# Patient Record
Sex: Male | Born: 1945 | Race: White | Hispanic: No | Smoking: Former smoker
Health system: Southern US, Community
[De-identification: ages and names within clinical notes are randomized; demographics above are authoritative.]

## PROBLEM LIST (undated history)

## (undated) DIAGNOSIS — E039 Hypothyroidism, unspecified: Secondary | ICD-10-CM

## (undated) DIAGNOSIS — Z8739 Personal history of other diseases of the musculoskeletal system and connective tissue: Secondary | ICD-10-CM

## (undated) DIAGNOSIS — Z955 Presence of coronary angioplasty implant and graft: Secondary | ICD-10-CM

## (undated) DIAGNOSIS — I10 Essential (primary) hypertension: Secondary | ICD-10-CM

## (undated) DIAGNOSIS — Z87891 Personal history of nicotine dependence: Secondary | ICD-10-CM

## (undated) DIAGNOSIS — E785 Hyperlipidemia, unspecified: Secondary | ICD-10-CM

## (undated) DIAGNOSIS — I219 Acute myocardial infarction, unspecified: Secondary | ICD-10-CM

## (undated) DIAGNOSIS — I2510 Atherosclerotic heart disease of native coronary artery without angina pectoris: Secondary | ICD-10-CM

## (undated) DIAGNOSIS — I739 Peripheral vascular disease, unspecified: Secondary | ICD-10-CM

## (undated) DIAGNOSIS — I214 Non-ST elevation (NSTEMI) myocardial infarction: Secondary | ICD-10-CM

## (undated) DIAGNOSIS — J439 Emphysema, unspecified: Secondary | ICD-10-CM

## (undated) DIAGNOSIS — I6529 Occlusion and stenosis of unspecified carotid artery: Secondary | ICD-10-CM

## (undated) DIAGNOSIS — M1990 Unspecified osteoarthritis, unspecified site: Secondary | ICD-10-CM

## (undated) DIAGNOSIS — J189 Pneumonia, unspecified organism: Secondary | ICD-10-CM

## (undated) HISTORY — PX: TONSILLECTOMY: SUR1361

## (undated) HISTORY — DX: Hyperlipidemia, unspecified: E78.5

## (undated) HISTORY — DX: Atherosclerotic heart disease of native coronary artery without angina pectoris: I25.10

## (undated) HISTORY — DX: Essential (primary) hypertension: I10

## (undated) HISTORY — DX: Occlusion and stenosis of unspecified carotid artery: I65.29

## (undated) HISTORY — DX: Emphysema, unspecified: J43.9

## (undated) HISTORY — DX: Personal history of nicotine dependence: Z87.891

## (undated) HISTORY — DX: Hypothyroidism, unspecified: E03.9

---

## 2011-05-25 DIAGNOSIS — I2510 Atherosclerotic heart disease of native coronary artery without angina pectoris: Secondary | ICD-10-CM

## 2011-05-25 DIAGNOSIS — I219 Acute myocardial infarction, unspecified: Secondary | ICD-10-CM

## 2011-05-25 HISTORY — DX: Atherosclerotic heart disease of native coronary artery without angina pectoris: I25.10

## 2011-05-25 HISTORY — DX: Acute myocardial infarction, unspecified: I21.9

## 2011-06-05 DIAGNOSIS — I214 Non-ST elevation (NSTEMI) myocardial infarction: Secondary | ICD-10-CM

## 2011-06-05 HISTORY — DX: Non-ST elevation (NSTEMI) myocardial infarction: I21.4

## 2011-06-08 ENCOUNTER — Emergency Department (HOSPITAL_COMMUNITY): Payer: Medicare Other

## 2011-06-08 ENCOUNTER — Inpatient Hospital Stay (HOSPITAL_COMMUNITY)
Admission: EM | Admit: 2011-06-08 | Discharge: 2011-06-17 | DRG: 233 | Disposition: A | Discharge status: Home / Self Care | Payer: Medicare Other | Attending: Cardiothoracic Surgery | Admitting: Cardiothoracic Surgery

## 2011-06-08 DIAGNOSIS — I2109 ST elevation (STEMI) myocardial infarction involving other coronary artery of anterior wall: Principal | ICD-10-CM | POA: Diagnosis present

## 2011-06-08 DIAGNOSIS — E785 Hyperlipidemia, unspecified: Secondary | ICD-10-CM | POA: Diagnosis present

## 2011-06-08 DIAGNOSIS — I5023 Acute on chronic systolic (congestive) heart failure: Secondary | ICD-10-CM | POA: Diagnosis not present

## 2011-06-08 DIAGNOSIS — I2510 Atherosclerotic heart disease of native coronary artery without angina pectoris: Secondary | ICD-10-CM | POA: Insufficient documentation

## 2011-06-08 DIAGNOSIS — I745 Embolism and thrombosis of iliac artery: Secondary | ICD-10-CM | POA: Diagnosis present

## 2011-06-08 DIAGNOSIS — K5900 Constipation, unspecified: Secondary | ICD-10-CM | POA: Diagnosis not present

## 2011-06-08 DIAGNOSIS — I2582 Chronic total occlusion of coronary artery: Secondary | ICD-10-CM | POA: Diagnosis present

## 2011-06-08 DIAGNOSIS — I25118 Atherosclerotic heart disease of native coronary artery with other forms of angina pectoris: Secondary | ICD-10-CM | POA: Insufficient documentation

## 2011-06-08 DIAGNOSIS — J449 Chronic obstructive pulmonary disease, unspecified: Secondary | ICD-10-CM | POA: Diagnosis present

## 2011-06-08 DIAGNOSIS — F17200 Nicotine dependence, unspecified, uncomplicated: Secondary | ICD-10-CM | POA: Diagnosis present

## 2011-06-08 DIAGNOSIS — E039 Hypothyroidism, unspecified: Secondary | ICD-10-CM | POA: Diagnosis present

## 2011-06-08 DIAGNOSIS — I739 Peripheral vascular disease, unspecified: Secondary | ICD-10-CM

## 2011-06-08 DIAGNOSIS — D62 Acute posthemorrhagic anemia: Secondary | ICD-10-CM | POA: Diagnosis not present

## 2011-06-08 DIAGNOSIS — J4489 Other specified chronic obstructive pulmonary disease: Secondary | ICD-10-CM | POA: Diagnosis present

## 2011-06-08 DIAGNOSIS — I509 Heart failure, unspecified: Secondary | ICD-10-CM | POA: Diagnosis present

## 2011-06-08 HISTORY — DX: Peripheral vascular disease, unspecified: I73.9

## 2011-06-08 LAB — POCT I-STAT TROPONIN I: Troponin i, poc: 21.55 ng/mL (ref 0.00–0.08)

## 2011-06-08 LAB — CK TOTAL AND CKMB (NOT AT ARMC)
CK, MB: 33.8 ng/mL (ref 0.3–4.0)
Relative Index: 3.3 — ABNORMAL HIGH (ref 0.0–2.5)

## 2011-06-08 LAB — CBC
MCHC: 34.5 g/dL (ref 30.0–36.0)
RDW: 14 % (ref 11.5–15.5)

## 2011-06-08 LAB — COMPREHENSIVE METABOLIC PANEL
ALT: 55 U/L — ABNORMAL HIGH (ref 0–53)
CO2: 25 mEq/L (ref 19–32)
Calcium: 9.2 mg/dL (ref 8.4–10.5)
Creatinine, Ser: 1.05 mg/dL (ref 0.50–1.35)
GFR calc Af Amer: 83 mL/min — ABNORMAL LOW (ref >90)
GFR calc non Af Amer: 71 mL/min — ABNORMAL LOW (ref >90)
Glucose, Bld: 119 mg/dL — ABNORMAL HIGH (ref 70–99)
Total Bilirubin: 0.3 mg/dL (ref 0.3–1.2)

## 2011-06-09 ENCOUNTER — Inpatient Hospital Stay (HOSPITAL_COMMUNITY): Payer: Medicare Other

## 2011-06-09 DIAGNOSIS — I739 Peripheral vascular disease, unspecified: Secondary | ICD-10-CM

## 2011-06-09 DIAGNOSIS — I2510 Atherosclerotic heart disease of native coronary artery without angina pectoris: Secondary | ICD-10-CM

## 2011-06-09 DIAGNOSIS — Z01810 Encounter for preprocedural cardiovascular examination: Secondary | ICD-10-CM

## 2011-06-09 HISTORY — PX: CARDIAC CATHETERIZATION: SHX172

## 2011-06-09 HISTORY — PX: LEFT HEART CATH AND CORONARY ANGIOGRAPHY: CATH118249

## 2011-06-09 LAB — HEPARIN LEVEL (UNFRACTIONATED): Heparin Unfractionated: 0.17 IU/mL — ABNORMAL LOW (ref 0.30–0.70)

## 2011-06-09 LAB — BASIC METABOLIC PANEL
Calcium: 8.7 mg/dL (ref 8.4–10.5)
Creatinine, Ser: 0.97 mg/dL (ref 0.50–1.35)
GFR calc non Af Amer: 84 mL/min — ABNORMAL LOW (ref >90)
Glucose, Bld: 95 mg/dL (ref 70–99)
Sodium: 139 mEq/L (ref 135–145)

## 2011-06-09 LAB — ABO/RH: ABO/RH(D): A POS

## 2011-06-09 LAB — CARDIAC PANEL(CRET KIN+CKTOT+MB+TROPI)
CK, MB: 21.5 ng/mL (ref 0.3–4.0)
CK, MB: 6.7 ng/mL (ref 0.3–4.0)
Relative Index: 2.1 (ref 0.0–2.5)
Total CK: 665 U/L — ABNORMAL HIGH (ref 7–232)
Total CK: 316 U/L — ABNORMAL HIGH (ref 7–232)
Troponin I: 6.37 ng/mL (ref <0.30)

## 2011-06-09 LAB — LIPID PANEL
LDL Cholesterol: 133 mg/dL — ABNORMAL HIGH (ref 0–99)
Triglycerides: 102 mg/dL (ref <150)
VLDL: 20 mg/dL (ref 0–40)

## 2011-06-09 LAB — HEMOGLOBIN A1C: Mean Plasma Glucose: 114 mg/dL (ref <117)

## 2011-06-10 ENCOUNTER — Inpatient Hospital Stay (HOSPITAL_COMMUNITY): Payer: Medicare Other

## 2011-06-10 DIAGNOSIS — I2510 Atherosclerotic heart disease of native coronary artery without angina pectoris: Secondary | ICD-10-CM

## 2011-06-10 DIAGNOSIS — Z951 Presence of aortocoronary bypass graft: Secondary | ICD-10-CM

## 2011-06-10 DIAGNOSIS — J449 Chronic obstructive pulmonary disease, unspecified: Secondary | ICD-10-CM | POA: Diagnosis present

## 2011-06-10 HISTORY — PX: CORONARY ARTERY BYPASS GRAFT: SHX141

## 2011-06-10 LAB — POCT I-STAT 3, ART BLOOD GAS (G3+)
Acid-base deficit: 3 mmol/L — ABNORMAL HIGH (ref 0.0–2.0)
Acid-base deficit: 2 mmol/L (ref 0.0–2.0)
Acid-base deficit: 2 mmol/L (ref 0.0–2.0)
Bicarbonate: 20.8 mEq/L (ref 20.0–24.0)
O2 Saturation: 95 %
O2 Saturation: 95 %
TCO2: 24 mmol/L (ref 0–100)
TCO2: 24 mmol/L (ref 0–100)
pCO2 arterial: 36 mmHg (ref 35.0–45.0)
pCO2 arterial: 39.8 mmHg (ref 35.0–45.0)
pH, Arterial: 7.405 (ref 7.350–7.450)
pO2, Arterial: 66 mmHg — ABNORMAL LOW (ref 80.0–100.0)
pO2, Arterial: 80 mmHg (ref 80.0–100.0)

## 2011-06-10 LAB — CBC
HCT: 36.1 % — ABNORMAL LOW (ref 39.0–52.0)
HCT: 24.2 % — ABNORMAL LOW (ref 39.0–52.0)
HCT: 28.4 % — ABNORMAL LOW (ref 39.0–52.0)
Hemoglobin: 12.3 g/dL — ABNORMAL LOW (ref 13.0–17.0)
Hemoglobin: 8.3 g/dL — ABNORMAL LOW (ref 13.0–17.0)
Hemoglobin: 9.4 g/dL — ABNORMAL LOW (ref 13.0–17.0)
MCH: 31.9 pg (ref 26.0–34.0)
MCH: 31 pg (ref 26.0–34.0)
MCHC: 34.1 g/dL (ref 30.0–36.0)
MCHC: 34.3 g/dL (ref 30.0–36.0)
MCHC: 33.1 g/dL (ref 30.0–36.0)
MCV: 93.1 fL (ref 78.0–100.0)
MCV: 93.7 fL (ref 78.0–100.0)
Platelets: 137 10*3/uL — ABNORMAL LOW (ref 150–400)
Platelets: 175 10*3/uL (ref 150–400)
RBC: 2.6 MIL/uL — ABNORMAL LOW (ref 4.22–5.81)
RBC: 3.03 MIL/uL — ABNORMAL LOW (ref 4.22–5.81)
RDW: 13.6 % (ref 11.5–15.5)
RDW: 13.5 % (ref 11.5–15.5)
WBC: 8.4 10*3/uL (ref 4.0–10.5)
WBC: 9.1 10*3/uL (ref 4.0–10.5)
WBC: 10.5 10*3/uL (ref 4.0–10.5)

## 2011-06-10 LAB — POCT I-STAT 4, (NA,K, GLUC, HGB,HCT)
Glucose, Bld: 97 mg/dL (ref 70–99)
Glucose, Bld: 141 mg/dL — ABNORMAL HIGH (ref 70–99)
Glucose, Bld: 86 mg/dL (ref 70–99)
HCT: 25 % — ABNORMAL LOW (ref 39.0–52.0)
HCT: 33 % — ABNORMAL LOW (ref 39.0–52.0)
HCT: 28 % — ABNORMAL LOW (ref 39.0–52.0)
HCT: 26 % — ABNORMAL LOW (ref 39.0–52.0)
Hemoglobin: 8.5 g/dL — ABNORMAL LOW (ref 13.0–17.0)
Hemoglobin: 10.2 g/dL — ABNORMAL LOW (ref 13.0–17.0)
Hemoglobin: 9.5 g/dL — ABNORMAL LOW (ref 13.0–17.0)
Hemoglobin: 8.8 g/dL — ABNORMAL LOW (ref 13.0–17.0)
Potassium: 3.3 mEq/L — ABNORMAL LOW (ref 3.5–5.1)
Potassium: 3.9 mEq/L (ref 3.5–5.1)
Potassium: 3.5 mEq/L (ref 3.5–5.1)
Potassium: 3.2 mEq/L — ABNORMAL LOW (ref 3.5–5.1)
Sodium: 143 mEq/L (ref 135–145)
Sodium: 143 mEq/L (ref 135–145)
Sodium: 143 mEq/L (ref 135–145)

## 2011-06-10 LAB — BLOOD GAS, ARTERIAL
Bicarbonate: 24.8 mEq/L — ABNORMAL HIGH (ref 20.0–24.0)
FIO2: 0.21 %
Patient temperature: 98.1
TCO2: 26 mmol/L (ref 0–100)
pCO2 arterial: 39.3 mmHg (ref 35.0–45.0)
pH, Arterial: 7.415 (ref 7.350–7.450)

## 2011-06-10 LAB — CREATININE, SERUM
Creatinine, Ser: 0.98 mg/dL (ref 0.50–1.35)
GFR calc Af Amer: >90 mL/min (ref >90)
GFR calc non Af Amer: 83 mL/min — ABNORMAL LOW (ref >90)

## 2011-06-10 LAB — POCT I-STAT, CHEM 8
Calcium, Ion: 1.18 mmol/L (ref 1.12–1.32)
Chloride: 110 mEq/L (ref 96–112)
HCT: 28 % — ABNORMAL LOW (ref 39.0–52.0)
Potassium: 4.3 mEq/L (ref 3.5–5.1)
Sodium: 141 mEq/L (ref 135–145)

## 2011-06-10 LAB — SURGICAL PCR SCREEN
MRSA, PCR: NEGATIVE
Staphylococcus aureus: NEGATIVE

## 2011-06-10 LAB — PROTIME-INR
INR: 1.27 (ref 0.00–1.49)
Prothrombin Time: 16.2 seconds — ABNORMAL HIGH (ref 11.6–15.2)

## 2011-06-10 LAB — URINALYSIS, MICROSCOPIC ONLY
Bilirubin Urine: NEGATIVE
Hgb urine dipstick: NEGATIVE
Ketones, ur: NEGATIVE mg/dL
Nitrite: NEGATIVE
Protein, ur: NEGATIVE mg/dL
Specific Gravity, Urine: 1.014 (ref 1.005–1.030)
Urobilinogen, UA: 0.2 mg/dL (ref 0.0–1.0)

## 2011-06-10 LAB — PULMONARY FUNCTION TEST

## 2011-06-10 LAB — BASIC METABOLIC PANEL
BUN: 14 mg/dL (ref 6–23)
Calcium: 8.9 mg/dL (ref 8.4–10.5)
GFR calc Af Amer: 75 mL/min — ABNORMAL LOW (ref >90)
GFR calc non Af Amer: 65 mL/min — ABNORMAL LOW (ref >90)
Glucose, Bld: 103 mg/dL — ABNORMAL HIGH (ref 70–99)
Potassium: 4.1 mEq/L (ref 3.5–5.1)

## 2011-06-10 LAB — MAGNESIUM: Magnesium: 2.9 mg/dL — ABNORMAL HIGH (ref 1.5–2.5)

## 2011-06-10 LAB — APTT: aPTT: 45 seconds — ABNORMAL HIGH (ref 24–37)

## 2011-06-10 LAB — HEPARIN LEVEL (UNFRACTIONATED): Heparin Unfractionated: 0.16 IU/mL — ABNORMAL LOW (ref 0.30–0.70)

## 2011-06-10 LAB — GLUCOSE, CAPILLARY: Glucose-Capillary: 115 mg/dL — ABNORMAL HIGH (ref 70–99)

## 2011-06-10 NOTE — Consult Note (Signed)
NAMEMarland [name]  RAMZI, BRATHWAITE NO.:  [id number]  MEDICAL RECORD NO.:  [id number]  LOCATION:  2926                         FACILITY:  [facility]  PHYSICIAN:  Sheliah Plane, MD    DATE OF BIRTH:  [birth year]  DATE OF CONSULTATION: DATE OF DISCHARGE:                                CONSULTATION   REQUESTING PHYSICIAN:  Landry Corporal, MD  CARDIOLOGIST:  Nicki Guadalajara, MD  No known primary physician.  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  HISTORY OF PRESENT ILLNESS:  The patient is a 65 year old male who has known long term smoking history since age 78, also history of hypothyroidism and hyperlipidemia, developed substernal chest pain which woke him during the night and then became diaphoretic and developed arm and chest pain and shortness of breath.  This lasted most of the Saturday night and then the patient went back to bed and on Sunday, he experienced similar chest pain.  This then resolved.  On Monday, he had additional chest pain and came to the emergency room.  EKG showed ST elevation in the anterior leads.  The atrial lead.  It was felt that the patient probably had a myocardial infarction on Saturday.  He was then stable and was admitted, underwent cardiac catheterization on June 09, 2011.  PAST MEDICAL HISTORY:  The patient has known hypertension, hypothyroidism, and hyperlipidemia.  PREVIOUS SURGERY:  Includes tonsillectomy.  Although the patient has known peripheral vascular disease, he denies any claudication.  See [facility] for home meds-reviewed  SOCIAL HISTORY:  The patient is divorced, lives alone, has smoked half a pack to 1 pack of cigarettes a day since age 57.  Does note that he is going to stop.  FAMILY HISTORY:  The patient's father died at age 69.  The patient's mother had cardiac surgery for valve problems.  She died at age 3.  He has 3 sisters, 1 died of blood  clotting disorder.  He has 1 brother.  REVIEW OF SYSTEMS:  The patient denies any fever, chills or night sweats or other constitutional symptoms other than the nausea and chest discomfort as noted.  He does note that he has shortness of breath.  His overall physical activity is fairly sedentary.  He denies claudication. He denies amaurosis or TIAs.  He denies any change in bowel habits.  No blood in stool or urine.  Denies any urinary difficulty.  Denies psychiatric history.  Other review of systems are negative.  PHYSICAL EXAMINATION:  VITAL SIGNS:  The patient's blood pressure is 140/80, heart rate 70 sinus, afebrile.  He is on 2 L with O2 sat of 96%. GENERAL:  The patient is in no distress, awake and alert, and able to relate his history. HEENT:  He has no scleral icterus. NECK:  Without adenopathy.  There is no jugular venous distention.  He has no bruits. CARDIAC:  Regular rate and rhythm without murmur, gallop. ABDOMEN:  Benign without palpable masses or tenderness. EXTREMITIES:  He has equal radial pulses bilaterally.  Both right and left groins are with pressure dressing.  There is no significant hematoma from the cardiac catheterization.  He  has +2 DP and PT pulses on the left, +1 on the right.  LABORATORY FINDINGS:  Potassium of 4.1, creatinine of 1.4.  Hematocrit 36, hemoglobin of 12.5, and platelet count 233,000.  The patient's CK-MB was 21.5, total 665.  Troponin was 22.18 on June 09, 2011 midnight. Hemoglobin A1c is 5.6.  TSH is elevated to 5.787.  Cardiac catheterization films were reviewed.  There was difficulty in passing the catheter from the right groin with right iliac totally occluded.  The left iliac has a proximal 70% stenosis.  There is some calcification of the left main coronary artery with possible 40% narrowing, though the left main is short __________ does have proximal 90% LAD lesion.  The circ is with some luminal irregularities, but none more than  30%.  The right is totally occluded with collateral filling from the left.  Ejection fraction was depressed at 40% with anterior hypokinesis.  IMPRESSION:  The patient with symptomatic severe 2-vessel coronary artery disease with known iliac disease.  With the patient's totally occluded right and diseased proximal left anterior descending coronary artery and symptomatic coronary artery, coronary artery bypasses has been recommend to the patient, especially in light of his depressed left ventricular function.  The risks and options were discussed with the patient and his family in detail.  The risk of death, infection, stroke, myocardial infarction, bleeding, blood transfusion all discussed.  In addition, we specifically discussed the patient's increased risk for surgery because of his long-term smoking and depressed left ventricular function, though the patient's risk at medical treatment would be much higher and attempts at angioplasty are not favorable with the left main calcification and total occlusion of the right coronary artery. Overall, he is told that the expected risk has been discussed with the patient with a 90% chance of reasonable outcome.  The patient is willing to proceed and we will plan to proceed tomorrow, on June 10, 2011.     Sheliah Plane, MD     EG/MEDQ  D:  06/10/2011  T:  06/10/2011  Job:  [id number]  Electronically Signed by Sheliah Plane MD on 06/10/2011 06:21:59 PM

## 2011-06-11 ENCOUNTER — Inpatient Hospital Stay (HOSPITAL_COMMUNITY): Payer: Medicare Other

## 2011-06-11 LAB — GLUCOSE, CAPILLARY
Glucose-Capillary: 108 mg/dL — ABNORMAL HIGH (ref 70–99)
Glucose-Capillary: 121 mg/dL — ABNORMAL HIGH (ref 70–99)
Glucose-Capillary: 116 mg/dL — ABNORMAL HIGH (ref 70–99)
Glucose-Capillary: 124 mg/dL — ABNORMAL HIGH (ref 70–99)
Glucose-Capillary: 109 mg/dL — ABNORMAL HIGH (ref 70–99)
Glucose-Capillary: 130 mg/dL — ABNORMAL HIGH (ref 70–99)

## 2011-06-11 LAB — POCT I-STAT, CHEM 8
Chloride: 105 mEq/L (ref 96–112)
Glucose, Bld: 112 mg/dL — ABNORMAL HIGH (ref 70–99)
HCT: 30 % — ABNORMAL LOW (ref 39.0–52.0)
Hemoglobin: 10.2 g/dL — ABNORMAL LOW (ref 13.0–17.0)
Potassium: 4.1 mEq/L (ref 3.5–5.1)
Sodium: 139 mEq/L (ref 135–145)

## 2011-06-11 LAB — CBC
HCT: 25.4 % — ABNORMAL LOW (ref 39.0–52.0)
HCT: 29.2 % — ABNORMAL LOW (ref 39.0–52.0)
Hemoglobin: 8.6 g/dL — ABNORMAL LOW (ref 13.0–17.0)
Hemoglobin: 9.7 g/dL — ABNORMAL LOW (ref 13.0–17.0)
MCH: 31.6 pg (ref 26.0–34.0)
MCH: 31.3 pg (ref 26.0–34.0)
MCHC: 33.9 g/dL (ref 30.0–36.0)
MCHC: 33.2 g/dL (ref 30.0–36.0)
MCV: 93.4 fL (ref 78.0–100.0)
MCV: 94.2 fL (ref 78.0–100.0)
Platelets: 171 10*3/uL (ref 150–400)
Platelets: 191 10*3/uL (ref 150–400)
RBC: 2.72 MIL/uL — ABNORMAL LOW (ref 4.22–5.81)
RBC: 3.1 MIL/uL — ABNORMAL LOW (ref 4.22–5.81)
RDW: 13.7 % (ref 11.5–15.5)
RDW: 13.9 % (ref 11.5–15.5)
WBC: 9.9 10*3/uL (ref 4.0–10.5)
WBC: 13.7 10*3/uL — ABNORMAL HIGH (ref 4.0–10.5)

## 2011-06-11 LAB — CREATININE, SERUM
Creatinine, Ser: 1.11 mg/dL (ref 0.50–1.35)
GFR calc Af Amer: 77 mL/min — ABNORMAL LOW (ref >90)
GFR calc non Af Amer: 67 mL/min — ABNORMAL LOW (ref >90)

## 2011-06-11 LAB — BASIC METABOLIC PANEL
BUN: 10 mg/dL (ref 6–23)
Chloride: 110 mEq/L (ref 96–112)
GFR calc Af Amer: 85 mL/min — ABNORMAL LOW (ref >90)
GFR calc non Af Amer: 73 mL/min — ABNORMAL LOW (ref >90)
Glucose, Bld: 125 mg/dL — ABNORMAL HIGH (ref 70–99)
Potassium: 4.1 mEq/L (ref 3.5–5.1)
Sodium: 140 mEq/L (ref 135–145)

## 2011-06-11 LAB — MAGNESIUM: Magnesium: 2.4 mg/dL (ref 1.5–2.5)

## 2011-06-12 ENCOUNTER — Inpatient Hospital Stay (HOSPITAL_COMMUNITY): Payer: Medicare Other

## 2011-06-12 LAB — GLUCOSE, CAPILLARY
Glucose-Capillary: 109 mg/dL — ABNORMAL HIGH (ref 70–99)
Glucose-Capillary: 133 mg/dL — ABNORMAL HIGH (ref 70–99)
Glucose-Capillary: 119 mg/dL — ABNORMAL HIGH (ref 70–99)

## 2011-06-12 LAB — BASIC METABOLIC PANEL
BUN: 11 mg/dL (ref 6–23)
CO2: 26 mEq/L (ref 19–32)
Calcium: 8.6 mg/dL (ref 8.4–10.5)
Chloride: 102 mEq/L (ref 96–112)
Creatinine, Ser: 1.05 mg/dL (ref 0.50–1.35)
GFR calc Af Amer: 83 mL/min — ABNORMAL LOW (ref >90)
GFR calc non Af Amer: 71 mL/min — ABNORMAL LOW (ref >90)
Glucose, Bld: 111 mg/dL — ABNORMAL HIGH (ref 70–99)
Potassium: 3.8 mEq/L (ref 3.5–5.1)
Sodium: 135 mEq/L (ref 135–145)

## 2011-06-12 LAB — CBC
HCT: 27.5 % — ABNORMAL LOW (ref 39.0–52.0)
Hemoglobin: 9.3 g/dL — ABNORMAL LOW (ref 13.0–17.0)
MCH: 31.8 pg (ref 26.0–34.0)
MCHC: 33.8 g/dL (ref 30.0–36.0)
MCV: 94.2 fL (ref 78.0–100.0)
Platelets: 175 10*3/uL (ref 150–400)
RBC: 2.92 MIL/uL — ABNORMAL LOW (ref 4.22–5.81)
RDW: 13.9 % (ref 11.5–15.5)
WBC: 10.3 10*3/uL (ref 4.0–10.5)

## 2011-06-12 NOTE — Cardiovascular Report (Signed)
NAMEMarland [name]  Michael, [name] NO.:  [id number]  MEDICAL RECORD NO.:  [id number]  LOCATION:  2926                         FACILITY:  [facility]  PHYSICIAN:  Landry Corporal, MD DATE OF BIRTH:  [birth year]  DATE OF PROCEDURE:  06/09/2011 DATE OF DISCHARGE:                           CARDIAC CATHETERIZATION   ADMITTING CARDIOLOGIST:  Nicki Guadalajara, MD.  PERFORMING PHYSICIAN:  Landry Corporal, MD  PROCEDURES PERFORMED: 1. Left heart catheterization eventually to the left femoral artery     access, common femoral artery access. 2. Left ventriculography in the RAO projection with 11 mL of contrast     per second for a total of 33 mL. 3. Native coronary angiography. 4. Intracoronary nitroglycerin injection 200 mcg x1. 5. Abdominal aortic angiogram with bilateral iliac runoff.  INDICATION: 1. Likely anterior MI, subacute with posterior infarct angina. 2. The patient reports leg cramping and there was difficulty passing     the catheter beyond the right into the right common iliac.  BRIEF HISTORY:  Michael [name] is a 65 year old gentleman with a history of long-term tobacco use, hypothyroidism, hyperlipidemia, and hypertension who developed severe anginal chest pain on Saturday, lasted overnight. It was associated with diaphoresis and radiated to both shoulders across the chest and down into his left arm.  It was there again off and on throughout the entire night and then was relieved by Sunday.  He also noted nausea associated with this.  He then had no symptoms on Sunday; however, had recurrence of anginal pain again on Saturday and then was brought in for concern for ST-elevation MI, although the consideration was it was likely non ST elevations as there are Q-waves and the patient had a positive troponins of 25 and therefore he was admitted for stabilization with plan for cardiac catheterization today.  The risks, benefits, alternatives, and indications of the  procedure were explained to the patient in detail and informed consent was obtained with signed form and placed on chart.  Possible risks include but not limited to death, MI, CVA, VF/VT arrest, need for pacemaker, bleeding, hematoma, vascular injury, coronary injury, renal insufficiency, contrast nephropathy, and adverse medication reactions.  The patient voiced understanding of this and agreed to proceed.  PROCEDURE IN DETAIL:  The patient was brought to the [facility] in the fasting state.  He was prepped and draped in usual sterile fashion for initially right femoral artery access.  After time-out was performed, the patient was sedated with intravenous Versed and fentanyl.  The right groin was anesthetized using 1% subcutaneous lidocaine and the right femoral head was localized using tactile fluoroscopic guidance.  With fluoroscopic guidance, the right common femoral artery was accessed and initially a J-wire was not able to pass adequately and therefore, I switched over to a Versacore wire, which passed into the common iliac distribution.  A 5-French sheath was then placed.  Then, the JR-4 catheter was advanced over the Versacore wire to the common iliac; however, would not pass beyond this.  An iliac angiogram was performed demonstrating severe stenosis and therefore the Versacore wire was exchanged for a Glidewire, however, this was not able to pass  through the presumed lumen in evidence of possible retrograde dissection plane and therefore distal aortogram was performed.  Decision made was to remove the catheter from the right groin, leave the sheath in place, and switch over to the left groin.  Left groin was then prepped and draped in usual sterile fashion.  The right common femoral head was localized using tactile fluoroscopic guidance and the right groin was anesthetized using 1% subcutaneous lidocaine.  Left common femoral artery was  then accessed using modified Seldinger technique with placement of 5-French sheath.  With the [facility] wire, I was able to advance into the descending aorta and therefore started with the 5- [place] JL-4 followed by 5-French JR-4 catheters, were advanced over initially the Versacore wire but then over long exchange wire. Initially, the left coronary artery system were obtained and then the right.  Angiographic image of both the left and right coronary artery system were obtained.  The JL-4 catheter was then re-exchanged for the JL-4 catheter, which was advanced up and used to engage the left coronary artery system for injection of intracoronary nitroglycerin to re-evaluate the LAD.  The catheter was then exchanged over a wire. Angled pigtail catheter was used to cross the aortic valve. Hemodynamics were measured and left ventriculogram was then performed. The catheter was then pulled back across the aortic valve measuring pullback gradient.  The catheter was then pulled back into abdominal aorta for two angiographic images of the proximal and then distal abdominal aorta with bi-iliac runoff.  Also please note with the catheter in place in the right common femoral artery, I was able to measure pressure gradient from central aortic pressure to right femoral artery pressure.  After completion of the case, the catheters were removed completely out of the body over the wire and an ACT was checked as the patient was on heparin prior to the procedure.  The patient was then transferred to the holding area for bilateral sheath removal.  He was in stable condition and no complications.  The estimated blood loss was less than 10 mL.  CATH STATISTICS: 1. Sedation:  3 mg Versed and 50 mcg of fentanyl. 2. Contrast: 50 mL. 3. Intracoronary nitroglycerin 200 mcg.  HEMODYNAMICS: 1. Left ventricular pressure 109/7 mmHg with EDP of 13 mmHg. 2. Central aortic pressure 105/66 mmHg with a mean of 82  mmHg. 3. Right femoral artery pressure of 69/53 mmHg making a peak gradient     of over roughly 40 mmHg. 4. Left ventriculography revealed an EF of 40-45% with a mid to distal     anterior and anterior apical and lateral hypokinesis.  There was no     pullback gradient.  ANGIOGRAPHIC FINDINGS: 1. The right coronary artery appears to be a small vessel and was 100%     occluded in the proximal segment.  It does fill however via LAD     collaterals and does show evidence of having a posterior descending     artery as well as a posterolateral branch that fills and looks to     be relatively large caliber.  The ostial portion of the right     coronary artery has severe calcification. 2. The left main is calcified in the ostial region.  Looking it from     the calcium, the caliber of the vessel does appear to be     significant external calcification; however, the luminal stenosis     is maybe 40-50% with 50% being the highest.  There  is a short left     main, which bifurcates into the LAD and circumflex artery. 3. The LAD is a small to moderate caliber vessel maybe 1-2.5 mm in the     proximal portion and has in the midportion, the 90% focal stenotic     lesion followed by another in the more distal part of the mid 60%     lesion.  The remainder of the LAD is diffusely calcified and     diseased tapering down to the apex, wraps around the apex, and     fills from left-to-right collaterals the PDA.  There is TIMI-3 flow     however. 4. The circumflex comes off shortly from the left main, it slips     around to the atrioventricular groove.  There is one branching     obtuse marginal branch and then some posterolateral branches.     There is maybe about 30% lesion at the bifurcation of the     atrioventricular groove and the obtuse marginal branches.  ABDOMINAL AORTOGRAM: 1. Bilateral renal arteries, celiac and SMA are widely patent. 2. The distal abdominal aorta begins to be a normal  caliber and then     it becomes mildly stenosed and then in the distal portion is     ectatic and mildly aneurysmal in the fusiform pattern.  The distal     aorta gives rise to a large lumbar collateral, which fills     antegrade into the right internal iliac and gives retrograde flow     back up into the right external iliac, which fills down into the     femoral arteries. 3. The right common iliac artery is 100% occluded at the bifurcation.     It reconstitutes as I mentioned via collateral filling from the     right lumbar artery.  After the reconstitution, there is really no     significant disease in the rest of the external iliac and common     femoral artery. 4. The left common iliac has a eccentric, exophytic, calcified roughly     70-80% lesion at the point of the midline artery takeoff.  The     internal iliac is a more distal vessel and has no significant     disease.  IMPRESSION: 1. Severe two-vessel disease with a 99% mid left anterior descending     coronary artery lesion with the left anterior descending coronary     artery giving left-to-right collaterals to 100% proximally occluded     right coronary artery, which is likely chronic. 2. There is a codominant large circumflex and minimal disease. 3. There is at least moderately-calcified left main, 40% to maybe 50%     at the most stenosis. 4. 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 5. Patent bilateral renal artery stents, celiac artery, and SMA     patent. 6. Mildly decreased ejection fraction with a patent left anterior     descending coronary artery wall motion abnormality.  PLAN: 1. As this is a least codominant RCA that has filled from the LAD that     is  diffusely diseased, and not a very good PCI candidate.  I felt     like the best option for now would be to consult CVT Surgery for     possible at least two-vessel CABG with the LAD and the RCA;     however, with the potential for left main disease, there could be     consideration to bypass one of the obtuse marginal branches as     well. 2. The patient will also likely require intervention on his bilateral     iliac disease.  I am not sure how great candidate this is for     percutaneous intervention and we will discuss and likely we will at     least get Vascular Surgery involved as well for post CABG. 3. We will continue on nitroglycerin and heparin drip.  Heparin drip     started 6 hours post sheath removal.  Would like to continue to     titrate his blood pressure medications, especially beta-blocker for     blood pressure control.  I did discuss the case with the patient and the patient's family as well as consultant CVTS, who will see the patient tonight.          ______________________________ Landry Corporal, MD     DWH/MEDQ  D:  06/09/2011  T:  06/10/2011  Job:  [id number]  cc:   Nicki Guadalajara, M.D.  Electronically Signed by Bryan Lemma MD on 06/12/2011 03:25:14 PM

## 2011-06-13 ENCOUNTER — Inpatient Hospital Stay (HOSPITAL_COMMUNITY): Payer: Medicare Other

## 2011-06-13 LAB — BASIC METABOLIC PANEL
BUN: 15 mg/dL (ref 6–23)
CO2: 27 mEq/L (ref 19–32)
Calcium: 8.8 mg/dL (ref 8.4–10.5)
Chloride: 102 mEq/L (ref 96–112)
Creatinine, Ser: 1.04 mg/dL (ref 0.50–1.35)
GFR calc Af Amer: 84 mL/min — ABNORMAL LOW (ref >90)
GFR calc non Af Amer: 72 mL/min — ABNORMAL LOW (ref >90)
Glucose, Bld: 101 mg/dL — ABNORMAL HIGH (ref 70–99)
Potassium: 3.7 mEq/L (ref 3.5–5.1)
Sodium: 138 mEq/L (ref 135–145)

## 2011-06-13 LAB — CBC
HCT: 28.4 % — ABNORMAL LOW (ref 39.0–52.0)
Hemoglobin: 9.6 g/dL — ABNORMAL LOW (ref 13.0–17.0)
MCH: 31.6 pg (ref 26.0–34.0)
MCHC: 33.8 g/dL (ref 30.0–36.0)
MCV: 93.4 fL (ref 78.0–100.0)
Platelets: 204 10*3/uL (ref 150–400)
RBC: 3.04 MIL/uL — ABNORMAL LOW (ref 4.22–5.81)
RDW: 13.9 % (ref 11.5–15.5)
WBC: 9.1 10*3/uL (ref 4.0–10.5)

## 2011-06-13 LAB — CROSSMATCH
ABO/RH(D): A POS
Antibody Screen: NEGATIVE
Unit division: 0
Unit division: 0

## 2011-06-13 LAB — GLUCOSE, CAPILLARY
Glucose-Capillary: 119 mg/dL — ABNORMAL HIGH (ref 70–99)
Glucose-Capillary: 92 mg/dL (ref 70–99)
Glucose-Capillary: 106 mg/dL — ABNORMAL HIGH (ref 70–99)

## 2011-06-14 ENCOUNTER — Inpatient Hospital Stay (HOSPITAL_COMMUNITY): Payer: Medicare Other

## 2011-06-14 LAB — CBC
HCT: 27.8 % — ABNORMAL LOW (ref 39.0–52.0)
MCH: 31.2 pg (ref 26.0–34.0)
MCV: 93.3 fL (ref 78.0–100.0)
Platelets: 231 10*3/uL (ref 150–400)
RBC: 2.98 MIL/uL — ABNORMAL LOW (ref 4.22–5.81)
WBC: 9 10*3/uL (ref 4.0–10.5)

## 2011-06-14 LAB — BASIC METABOLIC PANEL
BUN: 17 mg/dL (ref 6–23)
CO2: 30 mEq/L (ref 19–32)
Calcium: 8.6 mg/dL (ref 8.4–10.5)
Chloride: 99 mEq/L (ref 96–112)
Creatinine, Ser: 1.13 mg/dL (ref 0.50–1.35)

## 2011-06-14 LAB — GLUCOSE, CAPILLARY: Glucose-Capillary: 109 mg/dL — ABNORMAL HIGH (ref 70–99)

## 2011-06-15 ENCOUNTER — Inpatient Hospital Stay (HOSPITAL_COMMUNITY): Payer: Medicare Other

## 2011-06-15 LAB — BASIC METABOLIC PANEL
BUN: 19 mg/dL (ref 6–23)
CO2: 29 mEq/L (ref 19–32)
Chloride: 101 mEq/L (ref 96–112)
Creatinine, Ser: 1.15 mg/dL (ref 0.50–1.35)
GFR calc Af Amer: 74 mL/min — ABNORMAL LOW (ref >90)
Glucose, Bld: 105 mg/dL — ABNORMAL HIGH (ref 70–99)
Potassium: 4.1 mEq/L (ref 3.5–5.1)

## 2011-06-15 LAB — CBC
HCT: 28.6 % — ABNORMAL LOW (ref 39.0–52.0)
Hemoglobin: 9.7 g/dL — ABNORMAL LOW (ref 13.0–17.0)
MCV: 93.2 fL (ref 78.0–100.0)
RBC: 3.07 MIL/uL — ABNORMAL LOW (ref 4.22–5.81)
WBC: 8.3 10*3/uL (ref 4.0–10.5)

## 2011-06-16 ENCOUNTER — Inpatient Hospital Stay (HOSPITAL_COMMUNITY): Payer: Medicare Other

## 2011-06-16 DIAGNOSIS — F17200 Nicotine dependence, unspecified, uncomplicated: Secondary | ICD-10-CM

## 2011-06-16 LAB — BASIC METABOLIC PANEL
BUN: 20 mg/dL (ref 6–23)
CO2: 31 mEq/L (ref 19–32)
Calcium: 9.3 mg/dL (ref 8.4–10.5)
Chloride: 97 mEq/L (ref 96–112)
Creatinine, Ser: 1.13 mg/dL (ref 0.50–1.35)
GFR calc Af Amer: 76 mL/min — ABNORMAL LOW (ref >90)
GFR calc non Af Amer: 65 mL/min — ABNORMAL LOW (ref >90)
Glucose, Bld: 102 mg/dL — ABNORMAL HIGH (ref 70–99)
Potassium: 4.4 mEq/L (ref 3.5–5.1)
Sodium: 138 mEq/L (ref 135–145)

## 2011-06-16 LAB — CBC
HCT: 31 % — ABNORMAL LOW (ref 39.0–52.0)
Hemoglobin: 10.9 g/dL — ABNORMAL LOW (ref 13.0–17.0)
MCH: 32.8 pg (ref 26.0–34.0)
MCHC: 35.2 g/dL (ref 30.0–36.0)
MCV: 93.4 fL (ref 78.0–100.0)
Platelets: 413 10*3/uL — ABNORMAL HIGH (ref 150–400)
RBC: 3.32 MIL/uL — ABNORMAL LOW (ref 4.22–5.81)
RDW: 13.9 % (ref 11.5–15.5)
WBC: 11.3 10*3/uL — ABNORMAL HIGH (ref 4.0–10.5)

## 2011-06-16 NOTE — Op Note (Signed)
Michael [name], HASHEM NO.:  [id number]  MEDICAL RECORD NO.:  [id number]  LOCATION:  2316                         FACILITY:  [facility]  PHYSICIAN:  Sheliah Plane, MD    DATE OF BIRTH:  [birth year]  DATE OF PROCEDURE:  06/10/2011 DATE OF DISCHARGE:                              OPERATIVE REPORT   PREOPERATIVE DIAGNOSIS:  Coronary occlusive disease with recent inferior myocardial infarction out-of-hospital.  POSTOPERATIVE DIAGNOSIS:  Coronary occlusive disease with recent inferior myocardial infarction out-of-hospital.  SURGICAL PROCEDURE:  Coronary artery bypass grafting x2 off pump with the left internal mammary to the left anterior descending coronary artery and reverse saphenous vein graft to the distal right coronary artery with right thigh endovein harvesting.  SURGEON:  Sheliah Plane, MD  FIRST ASSISTANT:  Rowe Clack, P.A.-C  BRIEF HISTORY:  The patient is a 65 year old male with no known previous history of coronary artery disease who presents with a several-day episodes of prolonged chest pain, ultimately was admitted the 3rd day into his symptoms with positive troponins, and evidence of ST-elevation myocardial infarction.  He was admitted by Cardiology, felt since then he had out-of-hospital myocardial infarction several days prior, was stabilized medically, and then underwent cardiac catheterization by Dr. Herbie Baltimore a the day prior to surgery.  Catheterization revealed question of 30% left main, 90% proximal LAD lesion, and total occlusion of his right coronary artery with faint collateral filling from the circumflex. Circumflex had some luminal irregularities and a 20% to 30% stenosis of the first obtuse marginal.  Ejection fraction was depressed at approximately 40% with anterior apical and inferior hypokinesis.  The risks and options of bypass surgery were discussed with the patient in detail, including his increased risk of surgery because of  continued long-term smoking up until the day of the procedure ,and confirmed severe COPD by pulmonary function tests, and depressed LV function; however, with his proximal right disease, medical therapy with his symptoms would have a much worse outcome.  The risks including death, infection, stroke, myocardial infarction, bleeding, blood transfusion with surgery were all discussed with the patient.  He agreed and signed informed consent.  DESCRIPTION OF PROCEDURE:  With Swan-Ganz and arterial line monitors in place, the patient underwent general endotracheal anesthesia without incidence.  Skin of chest and legs were prepped with Betadine and draped in usual sterile manner.  Using the [employer] endovein harvesting system, a segment of vein was harvested from the right thigh and was of good quality and caliber.  Median sternotomy was performed.  Left internal mammary artery was dissected down as a pedicle graft.  The distal artery was divided, had excellent free flow.  Pericardium was opened.  The patient did have overall approximately 40% ejection fraction.  The right coronary artery was larger than anticipated from the films and transmural infarct in the inferior wall along the distribution of the posterior descending was obvious.  The patient's both LAD and the right coronary artery appeared to be suitable targets and were easily identifiable, so we proceeded with off pump coronary artery bypass grafting.  The patient was systemically heparinized.  Attention was turned first to the LAD.  The left internal mammary,  which had been dissected down as pedicle graft had excellent free flow.  The [employer] stabilization device was used, first the suction cup on the apex of the heart, white plastic tapes were placed proximally and distally around the LAD at the site of the anastomosis.  The vessel was opened.  During this time, the patient remained hemodynamically stable.  1-mm probe passed  distally, 1-5 probe passed proximally.  Using running 8-0 Prolene, left internal mammary artery was anastomosed to the left anterior descending coronary artery.  The bulldog on the mammary artery was released as the aortotomy was completed.  Fascia was tacked to the epicardium.  There was a good Doppler signal in the vessel when completed.  Attention was then turned to the right graft and a partial occlusion clamp was placed on the ascending aorta.  A single punch aortotomy was performed.  Then using a running 6-0 Prolene, the segment of reverse saphenous vein graft was anastomosed to the ascending aorta first.  The graft was then trimmed to appropriate length.  The stabilization device was placed along the acute margin of the heart, which gave good exposure of the distal right coronary artery.  This was dissected out.  Plastic tapes were placed around the distal right coronary artery.  This distal right coronary artery appeared much larger than the appearance on the films.  The vessel was opened, easily admitted a 1.5 mm probe distally.  Using a running 7-0 Prolene, the distal vein graft was anastomosed to the distal right coronary artery. The vessel was flushed as the atriotomy was completed and blood flow was restored down the right coronary artery.  During this period, the patient remained hemodynamically stable.  Dose of protamine was administered.  Sites of anastomosis were inspected and free of bleeding. Atrial and ventricular pacing wires had been applied.  A left pleural tube and a Blake mediastinal drain were left in place.  Pericardium was reapproximated.  Sternum was closed with #6 stainless steel wire. Fascia was closed with interrupted 0-Vicryl, running 3-0 Vicryl subcutaneous tissue, 4-0 subcuticular stitch in skin edges.  Dry dressings were applied.  Sponge and needle count was reported as correct at the completion of procedure.  The patient tolerated the procedure without  obvious complications.  Was transferred to the surgical intensive care unit for further postoperative care.  He did not require any blood bank blood products during the procedure.     Sheliah Plane, MD     EG/MEDQ  D:  06/12/2011  T:  06/12/2011  Job:  [id number]  cc:   Landry Corporal, MD  Electronically Signed by Sheliah Plane MD on 06/16/2011 12:41:31 PM

## 2011-06-16 NOTE — Consult Note (Signed)
NAMEELAND, Michael NO.:  [id number]  MEDICAL RECORD NO.:  [id number]  LOCATION:  2926                         FACILITY:  [facility]  PHYSICIAN:  Janetta Hora. Mahrosh Donnell, MD  DATE OF BIRTH:  [birth year]  DATE OF CONSULTATION:  06/09/2011 DATE OF DISCHARGE:                                CONSULTATION   REQUESTING PHYSICIAN:  Landry Corporal, MD with [facility].  REASON FOR CONSULTATION:  Right common iliac artery occlusion.  HISTORY OF PRESENT ILLNESS:  The patient is a 65 year old male that I was asked by Dr. Herbie Baltimore for evaluation of right common iliac artery occlusion.  The patient was recently admitted to the hospital with episodes of chest pain, shortness of breath, and had a cardiac catheterization earlier today.  He has multiple severe coronary artery disease and is being considered for coronary artery bypass grafting. Incidentally, found on his cardiac catheterization was a right common iliac occlusion with a large right lumbar collateral, which fills his right lower extremity distally.  The patient currently does not experience any claudication symptoms.  He is primarily limited by his shortness of breath, which occurs at approximately 30 yards.  He denies any rest pain.  He denies any nonhealing ulcers.  He may have had some claudication symptoms in the past, but this is been very remote.  He has not been very active recently because of his shortness of breath. Chronic medical problems include hyperlipidemia and hypothyroidism.  He also has a history of coronary artery disease.  He was smoking prior to admission and has smoked essentially all of his life.  PAST MEDICAL HISTORY:  As above.  SOCIAL HISTORY:  As above.  FAMILY HISTORY:  No history of early cardiovascular disease.  ALLERGIES:  No known drug allergies.  Full review of systems was performed and is positive for cardiac status and vascular as above.  Otherwise, it is  negative.  ABIs were reviewed.  ABI on the right was 0.8, on the left was 0.7.  He also had a carotid duplex exam today, which showed no significant stenosis bilaterally.  PHYSICAL EXAMINATION:  GENERAL:  White male in no acute distress. VITAL SIGNS:  Blood pressure 118/76, heart rate 71, oxygen saturation 98% on room air, temperature is 98.7. HEENT:  Negative. LUNGS:  Clear to auscultation. CARDIAC:  Regular rate and rhythm without murmur. ABDOMEN:  Soft, nontender, nondistended.  No mass. EXTREMITIES:  He has bandages on the left and right groins.  The left femoral pulse is easily palpable through this, and right femoral pulse is not easily palpable.  Popliteal and pedal pulses are absent bilaterally.  Feet are pink, warm, and well-perfused. NEURO:  Upper extremity and lower extremity motor strength is 5/5 and symmetric. SKIN:  No open ulcer or rash.  Creatinine 0.97.  Recent troponin 6.37.  ASSESSMENT:  Right common iliac artery occlusion is currently asymptomatic in the phase of severe coronary artery disease requiring coronary artery bypass grafting.  We will schedule the patient for an outpatient followup visit in 6 months' time, which will allow him time to recover from his coronary bypass procedure and see if he has any symptoms after  his exercise tolerance improves.  We will obtain ABIs at his repeat office visit in 6 months.     Janetta Hora. Peg Fifer, MD     CEF/MEDQ  D:  06/09/2011  T:  06/10/2011  Job:  [id number]  Electronically Signed by Fabienne Bruns MD on 06/16/2011 12:34:53 PM

## 2011-06-22 DIAGNOSIS — I214 Non-ST elevation (NSTEMI) myocardial infarction: Secondary | ICD-10-CM | POA: Insufficient documentation

## 2011-06-23 NOTE — Discharge Summary (Signed)
Michael, [name] NO.:  [id number]  MEDICAL RECORD NO.:  [id number]  LOCATION:  2017                         FACILITY:  [facility]  PHYSICIAN:  Sheliah Plane, MD    DATE OF BIRTH:  09/06/1945  DATE OF ADMISSION:  06/08/2011 DATE OF DISCHARGE:  06/17/2011                              DISCHARGE SUMMARY   PRIMARY ADMITTING DIAGNOSIS:  Chest pain.  ADDITIONAL/DISCHARGE DIAGNOSES: 1. Severe coronary artery disease. 2. Presumed out-of-hospital ST-segment elevation myocardial     infarction. 3. Hyperlipidemia. 4. Hypertension. 5. Hypothyroidism. 6. History of tobacco abuse. 7. Postoperative hypotension. 8. Postoperative acute blood loss anemia. 9. Right common iliac artery.  PROCEDURES PERFORMED: 1. Cardiac catheterization. 2. Coronary artery bypass grafting x2 off pump (left internal mammary     artery to the LAD, saphenous vein graft to the distal right     coronary artery). 3. Endoscopic vein harvest, right thigh.  HISTORY:  The patient is a 65 year old male with no known history of coronary artery disease.  On the Saturday prior to this admission, the patient developed severe chest discomfort at rest with associated diaphoresis and radiation into the shoulder and the left arm.  The pain ultimately resolved, but recurred again the following day.  There was some associated nausea, but no vomiting.  When the pain reoccurred again on Monday, the patient ultimately presented to the emergency department for further evaluation.  He was noted to have some ST elevation in anterior leads.  He was seen by Cardiology and it was felt that the patient had probably sustained a myocardial infarction when his pain initially began on Saturday.  He was subsequently admitted and started on medical therapy.  HOSPITAL COURSE:  The patient was stabilized and taken to the cardiac cath lab on June 09, 2011, where he underwent catheterization by Dr. Herbie Baltimore.  This showed  100% occlusion in the proximal right coronary artery, which filled via collaterals.  There was a 50% left main stenosis as well as a 90% LAD lesion.  Of note, during the catheterization, it was noted that the right common iliac artery was 100% occluded at the bifurcation as well as a 70%-80% calcified left common iliac artery.  It was felt that the patient was a poor candidate for percutaneous intervention and a Cardiac Surgery consult was obtained.  Dr. Sheliah Plane saw the patient and reviewed his films and felt that he would benefit from coronary artery bypass grafting.  He explained all risks, benefits, and alternatives of surgery to the patient and the patient agreed to proceed.  Prior to surgery, he did undergo preoperative workup including ABIs which were 0.8 on the right and 0.7 on the left, and carotid duplex studies which showed no stenosis bilaterally.  He also was seen in consultation by Dr. Fabienne Bruns for Vascular Surgery and it was felt that since his right common iliac artery occlusion was currently asymptomatic, that he should proceed with his coronary artery bypass surgery and would be seen in their office as an outpatient to follow up on his iliac artery stenoses.  All risks, benefits, and alternatives of surgery were explained to the patient and he agreed to proceed.  He was taken to the operating room on June 10, 2011, where he underwent coronary artery bypass grafting off pump x2 by Dr. Tyrone Sage.  Please see previously dictated operative report for complete details of surgery.  He tolerated the procedure well and was transferred to the SICU in stable condition.  He was extubated shortly after surgery.  He remained stable, although somewhat hypotensive initially postoperatively.  He did require pressor agents for the first 48 hours postoperatively, but these were weaned and discontinued.  He was started on a beta-blocker and a statin as well as aspirin for  his recent MI.  Once his blood pressure had stabilized, he was started on ACE inhibitor by Cardiology.  However, his blood pressure did not tolerate this and he developed hypotension which was symptomatic with blood pressures running in the 80s-90s systolic range.  The ACE inhibitor was discontinued and his blood pressure did begin to trend upward.  He otherwise remained stable and was able to be transferred to the step-down unit.  Overall, he has done well postoperatively.  He is ambulating in the halls without problem and has been weaned from supplemental oxygen.  His incisions are all healing well.  He has been diuresed back down to his preoperative weight and his Lasix has been discontinued.  He has been afebrile and his vital signs have been stable.  Despite the discontinuation of his ACE inhibitor, his blood pressures have continued to remain low normal with systolics between 90 and 110.  For this reason, he has not been restarted on an ACE inhibitor or an ARB.  Dr. Tyrone Sage and Dr. Tresa Endo discussed the possibility of starting him on an ARB as an outpatient.  Presently, his most recent labs show a sodium 138, potassium 4.4, BUN 20, creatinine 1.13. Hemoglobin 10.9, hematocrit 31, platelets 413, white count 11.3.  Chest x-ray showed a tiny left pleural effusion, otherwise stable.  He has been seen and evaluated on June 17, 2011, and is deemed ready for discharge home at this time.  DISCHARGE MEDICATIONS: 1. Enteric-coated aspirin 325 mg daily. 2. Lopressor 12.5 mg b.i.d. 3. Oxycodone IR 1 tablet q.4 h. p.r.n. pain. 4. Crestor 20 mg at bedtime. 5. Levothyroxine 100 mcg at bedtime. 6. Niacin 500 mg at bedtime. 7. Fish oil 1200 mg 2 capsules at bedtime.  DISCHARGE INSTRUCTIONS:  He is asked to refrain from driving, heavy lifting, or strenuous activity.  He may continue ambulating daily and using his incentive spirometer.  He may shower daily and clean his incisions with soap  and water.  He may continue a low-fat, low-sodium diet.  DISCHARGE FOLLOWUP:  He will need to make an appointment to see Dr. Tresa Endo in 2 weeks.  He will then follow up in 3 weeks with Dr. Dennie Maizes PA day with a chest x-ray from [facility] prior to this appointment.  In the interim, if he experiences any problems or has questions, he is asked to contact our office immediately.     Coral Ceo, P.A.   ______________________________ Sheliah Plane, MD    GC/MEDQ  D:  06/17/2011  T:  06/17/2011  Job:  [id number]  cc:   Nicki Guadalajara, M.D. Janetta Hora. Fields, MD [facility]  Electronically Signed by Weldon Inches. on 06/23/2011 10:11:05 AM Electronically Signed by Sheliah Plane MD on 06/23/2011 05:39:27 PM

## 2011-07-06 ENCOUNTER — Encounter: Payer: Self-pay | Admitting: Cardiothoracic Surgery

## 2011-07-07 ENCOUNTER — Other Ambulatory Visit: Payer: Self-pay | Admitting: Cardiothoracic Surgery

## 2011-07-07 DIAGNOSIS — I2510 Atherosclerotic heart disease of native coronary artery without angina pectoris: Secondary | ICD-10-CM

## 2011-07-08 ENCOUNTER — Encounter: Payer: Self-pay | Admitting: Cardiothoracic Surgery

## 2011-07-08 DIAGNOSIS — I10 Essential (primary) hypertension: Secondary | ICD-10-CM | POA: Insufficient documentation

## 2011-07-08 DIAGNOSIS — E039 Hypothyroidism, unspecified: Secondary | ICD-10-CM | POA: Insufficient documentation

## 2011-07-10 ENCOUNTER — Encounter: Payer: Self-pay | Admitting: *Deleted

## 2011-07-13 ENCOUNTER — Ambulatory Visit
Admission: RE | Admit: 2011-07-13 | Discharge: 2011-07-13 | Disposition: A | Discharge status: Home / Self Care | Payer: Medicare Other | Source: Ambulatory Visit | Attending: Cardiothoracic Surgery | Admitting: Cardiothoracic Surgery

## 2011-07-13 ENCOUNTER — Encounter: Payer: Self-pay | Admitting: Physician Assistant

## 2011-07-13 ENCOUNTER — Ambulatory Visit (INDEPENDENT_AMBULATORY_CARE_PROVIDER_SITE_OTHER): Payer: Self-pay | Admitting: Physician Assistant

## 2011-07-13 VITALS — BP 109/47 | HR 84 | Resp 16

## 2011-07-13 DIAGNOSIS — I2510 Atherosclerotic heart disease of native coronary artery without angina pectoris: Secondary | ICD-10-CM

## 2011-07-13 DIAGNOSIS — Z951 Presence of aortocoronary bypass graft: Secondary | ICD-10-CM

## 2011-07-13 NOTE — Progress Notes (Signed)
HPI:  Patient returns for routine postoperative follow-up having undergone off-pump CABG x 2 on 06/10/2011 by Dr. Tyrone Sage.  The patient's early postoperative recovery while in the hospital was notable for hypotension related to ACE-inhibitor therapy. This improved after the ACE-I was d/c'ed.   Since hospital discharge the patient reports no problems. He has seen Dr. Herbie Baltimore, and because his BP was still borderline, they have held off on resuming an ACE-I.  They did increase his Niacin.  He feels his stamina is slowly returning.  He is ambulating daily, but is limited by his peripheral vascular disease.  He states that Dr. Herbie Baltimore plans some type of intervention once he recovers from CABG.  He has quit smoking.  He denies chest pain or SOB.  Current Outpatient Prescriptions   Medication  Sig  Dispense  Refill   .  aspirin 325 MG EC tablet  Take 325 mg by mouth daily.     .  fish oil-omega-3 fatty acids 1000 MG capsule  Take 2 g by mouth daily.     Marland Kitchen  levothyroxine (SYNTHROID, LEVOTHROID) 100 MCG tablet  Take 100 mcg by mouth daily.     .  metoprolol tartrate (LOPRESSOR) 25 MG tablet  Take 12.5 mg by mouth 2 (two) times daily.     .  niacin (NIASPAN) 500 MG CR tablet  Take 500 mg by mouth at bedtime.     Marland Kitchen  oxyCODONE (ROXICODONE) 15 MG immediate release tablet  Take 15 mg by mouth every 4 (four) hours as needed.     .  rosuvastatin (CRESTOR) 20 MG tablet  Take 20 mg by mouth daily.      Physical Exam:  BP 109/47, HR 8, Resp 16 Wounds healing well.  Sternum is stable to palpation Heart- RRR, no murmurs Lungs-clear Extremities- no edema  Diagnostic Tests:  Chest x-ray- tiny residual bilateral effusion  Impression:  S/P CABG, doing well   Plan:  May begin driving, and increase activity as tolerated.  I have recommended outpatient cardiac rehab.  He will followup as directed with Dr. Herbie Baltimore.  We will see him back prn.

## 2011-07-13 NOTE — Patient Instructions (Signed)
May begin driving Increase activity as tolerated

## 2011-07-13 NOTE — Progress Notes (Deleted)
  HPI: Patient returns for routine postoperative follow-up having undergone *** on ***. The patient's early postoperative recovery while in the hospital was notable for ***. Since hospital discharge the patient reports ***.   Current Outpatient Prescriptions  Medication Sig Dispense Refill  . aspirin 325 MG EC tablet Take 325 mg by mouth daily.        . fish oil-omega-3 fatty acids 1000 MG capsule Take 2 g by mouth daily.        Marland Kitchen levothyroxine (SYNTHROID, LEVOTHROID) 100 MCG tablet Take 100 mcg by mouth daily.        . metoprolol tartrate (LOPRESSOR) 25 MG tablet Take 12.5 mg by mouth 2 (two) times daily.        . niacin (NIASPAN) 500 MG CR tablet Take 500 mg by mouth at bedtime.        Marland Kitchen oxyCODONE (ROXICODONE) 15 MG immediate release tablet Take 15 mg by mouth every 4 (four) hours as needed.        . rosuvastatin (CRESTOR) 20 MG tablet Take 20 mg by mouth daily.          Physical Exam: ***  Diagnostic Tests: ***  Impression: ***  Plan: ***

## 2011-08-19 HISTORY — PX: TRANSTHORACIC ECHOCARDIOGRAM: SHX275

## 2011-08-22 NOTE — H&P (Signed)
Michael [name] NO.:  [id number]  MEDICAL RECORD NO.:  [id number]  LOCATION:  [facility]                         FACILITY:  [facility]  PHYSICIAN:  Dr. Tresa Endo              DATE OF BIRTH:  [birth year]  DATE OF ADMISSION:  06/08/2011 DATE OF DISCHARGE:                             HISTORY & PHYSICAL   CHIEF COMPLAINT:  Chest pain.  HISTORY OF PRESENT ILLNESS:  Michael [name] is a 65 year old Caucasian male with a history of tobacco use, hypothyroidism, hyperlipidemia.  He developed a severe chest pain on Saturday and the patient stated he was going to the bathroom sitting on the toilet, became extremely diaphoretic and developed chest tightness from the right shoulder across his chest and then radiating down his left arm.  Pain lasted about 8 minutes and then, it went away and on Sunday the pain returned again. He also reports nausea, but never vomited.  He again experienced some chest pain today.  A EKG showed what appeared to be ST elevation in anterior leads.  Code STEMI was initiated.  The patient was evaluated in the ER by Dr. Tresa Endo.  He felt that the patient probably had infarct on Saturday.  There was no need for current intervention.  The patient denies any and shortness of breath, abdominal pain, and currently he is pain free in the chest.  MEDICATIONS:  Levothyroxine.  ALLERGIES:  No known drug allergies.  PAST MEDICAL HISTORY:  Hypothyroidism, hyperlipidemia, tonsillectomy as a teenager.  SOCIAL HISTORY:  The patient is divorced x2.  He does have a daughter. Lives in [place].  Smokes 1/2 packs of cigarettes per day for quite a few years.  FAMILY HISTORY:  Father deceased approximately age 60.  Mother had valve problems.  She deceased at age 51.  He has 3 sisters, 1 deceased from blood clot, he has 1 brother.  REVIEW OF SYSTEMS:  As per HPI.  PHYSICAL EXAMINATION:  VITAL SIGNS:  Blood pressure is 145/85 with heart rate of 104, temperature 98.6,  respiratory rate 16, and 96% oxygen saturation on 2 L/minute. GENERAL:  Michael [name] is a 65 year old Caucasian male, appears in no apparent distress.  HEENT:  Pupils are equal, round, reactive to light and accommodation.  Extraocular movements intact.  Negative scleral icterus. NECK:  Nontender.  Negative lymphadenopathy. CARDIOVASCULAR:  Regular rate and rhythm.  Negative murmurs, rubs, or gallops.  PULMONARY:  Clear to auscultation bilaterally.  Negative wheezes or rhonchi heard.  ABDOMEN:  Nontender, positive bowel sounds in all quadrants.  Peripheral vascular, 2+ radial pulses, and negative carotid or femoral bruits, decreased pedal pulses. EXTREMITIES:  Warm to touch.  Negative lower extremity edema. NEUROLOGIC: Strength is 5/5 equal bilateral upper and lower extremities. The patient is alert and oriented.  LAB:  Labs pending.   Chest x-ray pending.  EKG shows sinus tachycardia.  Q-waves in V1 and 2. The heart rate of 104 beats per minute.  IMPRESSION: 1. Unstable angina, possible status post infarction over the weekend. 2. Hypothyroidism 3. Hyperlipidemia.  PLAN:  The patient will be admitted to step-down unit and we will start IV nitroglycerin and  IV heparin per pharmacy.  We will also start Lopressor 25 mg q.8, Crestor 20 mg daily, aspirin 325 mg, check cardiac enzymes x3 q.8 hours.  We will also get a BMET.  We will also check fasting lipids, hemoglobin A1c, TSH, and repeat the EKG each morning next 2 days.  I will also get a 2D echocardiograms.  The patient started heart-healthy diet.  He will be n.p.o. after midnight for left heart catheterization on June 09, 2011.  We will initiate Cardiology p.r.n. orders.  He will be started on normal saline at 1 mL 2 mL/kg per hour at 4:00 a.m. tomorrow morning for hydration prior to the procedure.  Med and IV nitroglycerin and titrated for chest pain, should it return.    ______________________________ Michael Finlay,  PA   ______________________________ Dr. Gar Gibbon  D:  06/08/2011  T:  06/08/2011  Job:  [id number]  Electronically Signed by Michael Finlay PA on 06/12/2011 02:20:41 PM Electronically Signed by Nicki Guadalajara M.D. on 08/22/2011 02:17:01 PM

## 2011-08-24 ENCOUNTER — Encounter: Payer: Self-pay | Admitting: *Deleted

## 2011-10-21 ENCOUNTER — Encounter (HOSPITAL_COMMUNITY): Payer: Self-pay | Admitting: Respiratory Therapy

## 2011-10-22 ENCOUNTER — Encounter (HOSPITAL_COMMUNITY): Payer: Self-pay | Admitting: Cardiology

## 2011-10-22 ENCOUNTER — Other Ambulatory Visit: Payer: Self-pay | Admitting: Cardiovascular Disease

## 2011-10-22 DIAGNOSIS — I739 Peripheral vascular disease, unspecified: Secondary | ICD-10-CM | POA: Diagnosis present

## 2011-10-22 DIAGNOSIS — I779 Disorder of arteries and arterioles, unspecified: Secondary | ICD-10-CM | POA: Diagnosis present

## 2011-10-27 ENCOUNTER — Encounter (HOSPITAL_COMMUNITY): Payer: Self-pay | Admitting: Cardiology

## 2011-10-27 ENCOUNTER — Ambulatory Visit (HOSPITAL_COMMUNITY)
Admission: RE | Admit: 2011-10-27 | Discharge: 2011-10-28 | Disposition: A | Discharge status: Home / Self Care | Payer: Medicare Other | Source: Ambulatory Visit | Attending: Cardiovascular Disease | Admitting: Cardiovascular Disease

## 2011-10-27 ENCOUNTER — Encounter (HOSPITAL_COMMUNITY)
Admission: RE | Disposition: A | Discharge status: Home / Self Care | Payer: Self-pay | Source: Ambulatory Visit | Attending: Cardiovascular Disease

## 2011-10-27 DIAGNOSIS — E039 Hypothyroidism, unspecified: Secondary | ICD-10-CM | POA: Diagnosis present

## 2011-10-27 DIAGNOSIS — E785 Hyperlipidemia, unspecified: Secondary | ICD-10-CM | POA: Insufficient documentation

## 2011-10-27 DIAGNOSIS — I70219 Atherosclerosis of native arteries of extremities with intermittent claudication, unspecified extremity: Secondary | ICD-10-CM | POA: Insufficient documentation

## 2011-10-27 DIAGNOSIS — Z951 Presence of aortocoronary bypass graft: Secondary | ICD-10-CM

## 2011-10-27 DIAGNOSIS — I2510 Atherosclerotic heart disease of native coronary artery without angina pectoris: Secondary | ICD-10-CM

## 2011-10-27 DIAGNOSIS — I252 Old myocardial infarction: Secondary | ICD-10-CM | POA: Insufficient documentation

## 2011-10-27 DIAGNOSIS — I739 Peripheral vascular disease, unspecified: Secondary | ICD-10-CM

## 2011-10-27 DIAGNOSIS — J4489 Other specified chronic obstructive pulmonary disease: Secondary | ICD-10-CM | POA: Insufficient documentation

## 2011-10-27 DIAGNOSIS — I25118 Atherosclerotic heart disease of native coronary artery with other forms of angina pectoris: Secondary | ICD-10-CM | POA: Diagnosis present

## 2011-10-27 DIAGNOSIS — J449 Chronic obstructive pulmonary disease, unspecified: Secondary | ICD-10-CM | POA: Diagnosis present

## 2011-10-27 DIAGNOSIS — I779 Disorder of arteries and arterioles, unspecified: Secondary | ICD-10-CM | POA: Diagnosis present

## 2011-10-27 DIAGNOSIS — I10 Essential (primary) hypertension: Secondary | ICD-10-CM | POA: Insufficient documentation

## 2011-10-27 HISTORY — DX: Peripheral vascular disease, unspecified: I73.9

## 2011-10-27 HISTORY — PX: ILIAC ARTERY STENT: SHX1786

## 2011-10-27 HISTORY — PX: PERCUTANEOUS STENT INTERVENTION: SHX5500

## 2011-10-27 LAB — CBC
HCT: 38.5 % — ABNORMAL LOW (ref 39.0–52.0)
MCV: 93.7 fL (ref 78.0–100.0)
Platelets: 247 10*3/uL (ref 150–400)
RBC: 4.11 MIL/uL — ABNORMAL LOW (ref 4.22–5.81)
WBC: 6.6 10*3/uL (ref 4.0–10.5)

## 2011-10-27 LAB — URINALYSIS, ROUTINE W REFLEX MICROSCOPIC
Bilirubin Urine: NEGATIVE
Nitrite: NEGATIVE
Specific Gravity, Urine: 1.041 — ABNORMAL HIGH (ref 1.005–1.030)
Urobilinogen, UA: 0.2 mg/dL (ref 0.0–1.0)
pH: 5.5 (ref 5.0–8.0)

## 2011-10-27 LAB — BASIC METABOLIC PANEL
CO2: 25 mEq/L (ref 19–32)
Chloride: 106 mEq/L (ref 96–112)
Sodium: 137 mEq/L (ref 135–145)

## 2011-10-27 LAB — URINE MICROSCOPIC-ADD ON

## 2011-10-27 LAB — POCT ACTIVATED CLOTTING TIME: Activated Clotting Time: 171 seconds

## 2011-10-27 SURGERY — ATHERECTOMY PERIPHERAL ARTERY

## 2011-10-27 MED ORDER — ASPIRIN EC 325 MG PO TBEC
325.0000 mg | DELAYED_RELEASE_TABLET | Freq: Every day | ORAL | Status: DC
  Administered 2011-10-28: 325 mg via ORAL
  Filled 2011-10-27: qty 1

## 2011-10-27 MED ORDER — ZOLPIDEM TARTRATE 5 MG PO TABS: 5.0000 mg | ORAL_TABLET | Freq: Every evening | ORAL | Status: DC | PRN

## 2011-10-27 MED ORDER — MIDAZOLAM HCL 2 MG/2ML IJ SOLN
INTRAMUSCULAR | Status: AC
  Filled 2011-10-27: qty 2

## 2011-10-27 MED ORDER — SODIUM CHLORIDE 0.9 % IJ SOLN: 3.0000 mL | Freq: Two times a day (BID) | INTRAMUSCULAR | Status: DC

## 2011-10-27 MED ORDER — ACETAMINOPHEN 325 MG PO TABS: 650.0000 mg | ORAL_TABLET | ORAL | Status: DC | PRN

## 2011-10-27 MED ORDER — SODIUM CHLORIDE 0.9 % IV SOLN: INTRAVENOUS | Status: AC

## 2011-10-27 MED ORDER — LISINOPRIL 2.5 MG PO TABS
2.5000 mg | ORAL_TABLET | Freq: Every day | ORAL | Status: DC
  Administered 2011-10-28: 2.5 mg via ORAL
  Filled 2011-10-27 (×2): qty 1

## 2011-10-27 MED ORDER — LEVOTHYROXINE SODIUM 100 MCG PO TABS
100.0000 ug | ORAL_TABLET | Freq: Every day | ORAL | Status: DC
  Administered 2011-10-27: 100 ug via ORAL
  Filled 2011-10-27 (×2): qty 1

## 2011-10-27 MED ORDER — OXYCODONE-ACETAMINOPHEN 5-325 MG PO TABS
1.0000 | ORAL_TABLET | ORAL | Status: DC | PRN
  Administered 2011-10-27: 1 via ORAL
  Filled 2011-10-27: qty 1

## 2011-10-27 MED ORDER — CLOPIDOGREL BISULFATE 300 MG PO TABS
ORAL_TABLET | ORAL | Status: AC
  Filled 2011-10-27: qty 2

## 2011-10-27 MED ORDER — HEPARIN SODIUM (PORCINE) 1000 UNIT/ML IJ SOLN
INTRAMUSCULAR | Status: AC
  Filled 2011-10-27: qty 1

## 2011-10-27 MED ORDER — MORPHINE SULFATE 2 MG/ML IJ SOLN: 1.0000 mg | INTRAMUSCULAR | Status: DC | PRN

## 2011-10-27 MED ORDER — METOPROLOL TARTRATE 25 MG PO TABS
25.0000 mg | ORAL_TABLET | Freq: Two times a day (BID) | ORAL | Status: DC
  Administered 2011-10-27 – 2011-10-28 (×2): 25 mg via ORAL
  Filled 2011-10-27 (×3): qty 1

## 2011-10-27 MED ORDER — LIDOCAINE HCL (PF) 1 % IJ SOLN
INTRAMUSCULAR | Status: AC
  Filled 2011-10-27: qty 30

## 2011-10-27 MED ORDER — CLOPIDOGREL BISULFATE 75 MG PO TABS
600.0000 mg | ORAL_TABLET | ORAL | Status: AC
  Administered 2011-10-27: 600 mg via ORAL
  Filled 2011-10-27: qty 1

## 2011-10-27 MED ORDER — PROTAMINE SULFATE 10 MG/ML IV SOLN
INTRAVENOUS | Status: AC
  Filled 2011-10-27: qty 5

## 2011-10-27 MED ORDER — ONDANSETRON HCL 4 MG/2ML IJ SOLN: 4.0000 mg | Freq: Four times a day (QID) | INTRAMUSCULAR | Status: DC | PRN

## 2011-10-27 MED ORDER — SODIUM CHLORIDE 0.9 % IV SOLN
INTRAVENOUS | Status: DC
  Administered 2011-10-27: 10:00:00 via INTRAVENOUS

## 2011-10-27 MED ORDER — SODIUM CHLORIDE 0.9 % IJ SOLN: 3.0000 mL | INTRAMUSCULAR | Status: DC | PRN

## 2011-10-27 MED ORDER — NIACIN ER (ANTIHYPERLIPIDEMIC) 500 MG PO TBCR
1000.0000 mg | EXTENDED_RELEASE_TABLET | Freq: Every day | ORAL | Status: DC
  Administered 2011-10-27: 1000 mg via ORAL
  Filled 2011-10-27 (×2): qty 2

## 2011-10-27 MED ORDER — FENTANYL CITRATE 0.05 MG/ML IJ SOLN
INTRAMUSCULAR | Status: AC
  Filled 2011-10-27: qty 2

## 2011-10-27 MED ORDER — ATORVASTATIN CALCIUM 40 MG PO TABS
40.0000 mg | ORAL_TABLET | Freq: Every day | ORAL | Status: DC
  Administered 2011-10-27: 40 mg via ORAL
  Filled 2011-10-27 (×2): qty 1

## 2011-10-27 MED ORDER — CLOPIDOGREL BISULFATE 75 MG PO TABS
75.0000 mg | ORAL_TABLET | Freq: Every day | ORAL | Status: DC
  Administered 2011-10-28: 75 mg via ORAL
  Filled 2011-10-27: qty 1

## 2011-10-27 MED ORDER — DIAZEPAM 5 MG PO TABS
5.0000 mg | ORAL_TABLET | ORAL | Status: AC
  Administered 2011-10-27: 5 mg via ORAL
  Filled 2011-10-27: qty 1

## 2011-10-27 MED ORDER — HEPARIN (PORCINE) IN NACL 2-0.9 UNIT/ML-% IJ SOLN
INTRAMUSCULAR | Status: AC
  Filled 2011-10-27: qty 1000

## 2011-10-27 MED ORDER — SODIUM CHLORIDE 0.9 % IV SOLN
1.0000 mL/kg/h | INTRAVENOUS | Status: AC
  Administered 2011-10-27: 1 mL/kg/h via INTRAVENOUS

## 2011-10-27 MED ORDER — SODIUM CHLORIDE 0.9 % IV SOLN: 250.0000 mL | INTRAVENOUS | Status: DC | PRN

## 2011-10-27 NOTE — H&P (Addendum)
History and Physical Interval Note:  NAME:  Michael [name]   MRN: [id number] DOB:  18-Oct-1945   ADMIT DATE: 10/27/2011   10/27/2011 8:49 AM  Michael [name] is a 66 y.o. male with a PMH of CAD-s/p Inferior STEMI followed by 2 Vessel CABG in Oct 2012,COPD (quit smoking @ time of MI), HTN / HLD who was also diagnosed with severe bilateral Common Iliac Artery disease -- 100% flush occlusion of RCIA with collateral filling of the External Iliac via a Lumbar collateral to the Internal Iliac providing retrograde flow along with an ~80% eccentric occlusion of the L CIA.  His recovery from CABG ==> rehabilitation / exercise, has been hampered by Dyspnea on Exertion due to COPD, but more so from bilateral buttock / thigh claudication (R>L).  Over the past ~ month, he has been noting more Left sided pain.  He has been evaluated with a Steffanie Dunn on Feb 15th, 2013 that was non-ischemic with ? Diaphragmatic artifact vs. Scar (although scar is likely with his known MI history).  A post-operative Echocardiogram in Dec 2012 demonstrated perserved LVEF (55% improved from peri-infarct EF of ~40%) with apical hypokinesis.  The patients' history has been reviewed, patient examined, no change in status from most recent note, stable for surgery. I have reviewed the patients' chart and labs. Questions were answered to the patient's satisfaction.   Past Medical History  Diagnosis Date  . CAD (coronary artery disease) 06/08/2011    S/p CABG   . Hyperlipemia   . HTN (hypertension)   . Hypothyroidism   . History of tobacco abuse   . PAD (peripheral artery disease) 06/08/2011    Right Common Iliac Occlusion, Left Common Iliac 80+% occlusion  . S/P CABG x 2 06/10/2011    LIMA-LAD, SVG-RPDA   Past Surgical History  Procedure Date  . Cardiac catheterization   . Coronary artery bypass graft 06/10/2011    off pump x2(lima)(r)thigh evh  Dr. Tyrone Sage   No family history on file. -- non-contributory on review.  SOCHx:    History  Substance Use Topics  . Smoking status: Former Smoker    Types: Cigarettes    Quit date: 06/08/2011  . Smokeless tobacco: Former Neurosurgeon    Types: Chew    Quit date: 07/13/2003  . Alcohol Use: 0.5 - 1.0 oz/week    1-2 drink(s) per week   No Known Allergies  A comprehensive Review of Systems was negative with the exception of: Review of Systems  Cardiovascular: Positive for claudication. Negative for chest pain, palpitations, orthopnea, leg swelling and PND.       Bilateral buttock & thigh; R> L  Respiratory ROS: positive for - shortness of breath and worse with exertion, AM cough Musculoskeletal ROS: positive for - pain in hip - left and leg - left - sharp type of pain wrapping around hip - leg down to knee (neuropathic sounding)   HOME MEDS: Medications Prior to Admission  Medication Dose Route Frequency Provider Last Rate Last Dose  . 0.9 %  sodium chloride infusion   Intravenous Continuous Runell Gess, MD      . clopidogrel (PLAVIX) tablet 600 mg  600 mg Oral Pre-Cath Runell Gess, MD      . diazepam (VALIUM) tablet 5 mg  5 mg Oral On Call Runell Gess, MD       Medications Prior to Admission  Medication Sig Dispense Refill  . aspirin 325 MG EC tablet Take 325 mg by mouth daily.        Marland Kitchen  atorvastatin (LIPITOR) 40 MG tablet Take 40 mg by mouth daily.      Marland Kitchen levothyroxine (SYNTHROID, LEVOTHROID) 100 MCG tablet Take 100 mcg by mouth daily.        Marland Kitchen lisinopril (PRINIVIL,ZESTRIL) 2.5 MG tablet Take 2.5 mg by mouth daily.      . metoprolol tartrate (LOPRESSOR) 25 MG tablet Take 25 mg by mouth 2 (two) times daily.       . niacin (NIASPAN) 500 MG CR tablet Take 1,000 mg by mouth at bedtime.         PHYSICAL EXAM: BP 116/71  Pulse 64  Temp(Src) 98.9 F (37.2 C) (Oral)  Resp 20  Ht 5\' 10"  (1.778 m)  Wt 83.462 kg (184 lb)  BMI 26.40 kg/m2  SpO2 98% General appearance: alert, cooperative, appears stated age, no distress and pleasant, normal mood &  affect Neck: no adenopathy, no JVD, supple, symmetrical, trachea midline, thyroid not enlarged, symmetric, no tenderness/mass/nodules and soft bruit on Right side Lungs: clear to auscultation bilaterally, normal percussion bilaterally and diminished BS - with increased AP diameter & prolonged expiratory phase Heart: regular rate and rhythm, S1, S2 normal, no murmur, click, rub or gallop Abdomen: soft, non-tender; bowel sounds normal; no masses,  no organomegaly Extremities: extremities normal, atraumatic, no cyanosis or edema Pulses: Left Femoral present 2+ and with bruit Right Femoral barely palpable and with bruit Left Popliteal present 1+ Right Popliteal present 1+ Left Dorsalis Pedis present 1+ Right Dorsalis Pedis barely palpable Left Posterior Tibial present 1+ Right Posterior Tibial barely palpable Neurologic: Alert and oriented X 3, normal strength and tone. Normal symmetric reflexes. Normal coordination and gait   PROBLEM LIST: Principal Problem:  *PAD (peripheral artery disease) - Right Common Iliac Occlusion, Left Common Iliac 80+% occlusion Active Problems:  CAD (coronary artery disease) - s/p CABG  COPD (chronic obstructive pulmonary disease)  Hyperlipemia  HTN (hypertension)  S/P CABG x 2  Hypothyroidism   PLAN Burnard Enis has presented today for surgery, with the diagnosis of chest pain The various methods of treatment have been discussed with the patient and family. After consideration of risks, benefits and other options for treatment, the patient has consented to Procedure(s):  PERIPHERAL VASCULAR ANGIOGRAPHY +/- PERIPHERAL VASCULAR INTERVENTION (ATHERECTOMY, PTA, STENT)  as a surgical intervention.  This procedure has been fully reviewed with the patient and written informed consent has been obtained.  We will proceed with Planned LE Angiography with PTA/Stenting (possible Rotational Atherectomy) of Left Common Iliac Artery, we will attempt to cross the  occluded R Common Iliac & if successful, will proceed with PTA/Stenting of this vessel as well. Continue current home medications & will add Clopidogrel.  Jaimarie Rapozo W [facility] [address] [place], [place]  [address]  [phone]  10/27/2011 8:49 AM

## 2011-10-27 NOTE — CV Procedure (Signed)
[facility]  PERIPHERAL VASCULAR PROCEDURE  NAME:  Michael [name]   MRN: [id number] DOB:  1945/12/11   ADMIT DATE: 10/27/2011  Performing Cardiologist: Marykay Lex Primary Physician: Provider Not In System Primary Cardiologist:  Marykay Lex, MD, MS  Procedures Performed:  Abdominal Aortic Angiogram with Bi-Iliofemoral Runoff  Diamondback Atherectomy of the Left Common Iliac Artery  PTCA and stenting of the Left Common Iliac Artery  Indication(s): Symptom limiting claudication  History: 66 y.o. male with A history of CAD-s/p Inferior STEMI followed by 2 Vessel CABG in Oct 2012,COPD (quit smoking @ time of MI), HTN / HLD who was also diagnosed with severe bilateral Common Iliac Artery disease -- 100% flush occlusion of RCIA with collateral filling of the External Iliac via a Lumbar collateral to the Internal Iliac providing retrograde flow along with an ~80% eccentric occlusion of the L CIA. His recovery from CABG ==> rehabilitation / exercise, has been hampered by Dyspnea on Exertion due to COPD, but more so from bilateral buttock / thigh claudication (R>L). Over the past ~ month, he has been noting more Left sided pain.  Consent: The procedure with Risks/Benefits/Alternatives and Indications was reviewed with the patient (and family).  All questions were answered.  Risks / Complications include, but not limited to: Death, MI, CVA/TIA, VF/VT (with defibrillation), Bradycardia (need for temporary pacer placement), contrast induced nephropathy, bleeding / bruising / hematoma / pseudoaneurysm, vascular injury (with possible Vascular Surgery), adverse medication reactions, infection, compartment syndrome.    The patient (and family) voice understanding and agree to proceed.    Consent for signed by MD and patient with RN witness -- placed on chart.    Procedure: The patient was brought to the 2nd Floor [facility] in the fasting state  and prepped and draped in the usual sterile fashion for (Right and Left groin.    Sterile technique was used including antiseptics, cap, gloves, gown, hand hygiene, mask and sheet.  Skin prep: Chlorhexidine.  Time Out: Verified patient identification, verified procedure, site/side was marked, verified correct patient position, special equipment/implants available, medications/allergies/relevent history reviewed, required imaging and test results available.  Performed  Bilateral femoral heads were identified using tactile and fluoroscopic technique.  The left groin was anesthetized with 1% subcutaneous Lidocaine.  The left Common Femoral Artery was accessed using the Modified Seldinger Technique with placement of a antimicrobial bonded/coated single lumen (7 Fr) sheath.  The sheath was aspirated and flushed.    A 5 Fr Short Pigtail Catheter was advanced of over a Versicore wire into the descending Aorta to a level just above the aorto-iliac bifurcation.  A power injection of 83ml/sec contrast over 1 sec was performed for Abdominal Aortic Angiography.  The catheter was then pulled back to a level just above the Aortic bifurcation, and a second power injection was performed to evaluate bi-ileiofemoral arteries.  Using this angiogram as a roadmap, the right groin was anesthetized with 1% subcutaneous Lidocaine.  The right Common Femoral Artery was accessed using the Modified Seldinger Technique with placement of a antimicrobial bonded/coated single lumen (6 Fr) sheath.  The sheath was aspirated and flushed.    Guiding angiograms were performed glow and tell tape.  Findings:  Distal Abdominal aorta: Irregular border but no significant lesions, heavily calcified  The right distal lumbar artery is a robust large caliber vessel was gives collaterals via the right internal iliac retrograde to the right common iliac artery.  The left lumbar artery this  location also provides collaterals but is not as  robust.  Right common iliac artery: Flush occluded at the carina. It reconstitutes just proximal to the internal/external bifurcation the aortic retrograde flow as described above.  Right internal iliac artery: Large caliber minimal luminal irregularities  Right external iliac artery: Widely patent with brisk flow, albeit delayed. There is a 40 mm pressure gradient from the abdominal aorta.  Right common femoral artery: Proximal portion to the bifurcation it appears to be widely patent but calcified.  Left common iliac artery:  Proximal heavily calcified exophytic 80% o stenosis with calcium noted on the inferior border of the vessel and a small retrograde portion of low undeveloped of the calcium is also noted.  Left internal iliac artery: Widely patent with minimal luminal irregularities  Left external iliac artery: Widely patent, and minimal luminal irregularities  Left common femoral artery: Proximal portion to the bifurcation it appears to be widely patent but calcified.  PERCUTANEOUS CORONARY INTERVENTION PROCEDURE An intravenous Heparin bolus was administered to ensure an ACT of greater than 200 seconds. (First a 5000 unit followed by 3000 unit then 2000 unit boluses were administered to ensure an ACT greater than 200.)  Premedicated with 325 mg of oral Aspirin and 600 mg Clopidogrel 5000 Units of Heparin IV was administered.  The pigtail catheter was exchanged for an angled catheter of the Versicore wire. The Versicore was removed and exchanged for the Rotablator Viper wire.   Lesion:  Proximal Left Common Iliac Artery  Pre-PCI Stenosis: 80 % Post-PCI Stenosis: 20-30 %  Pre-Pressure gradient 30-35 mm Hg (117/71 mmHg--85/53 mg Hg);   Post-pressure gradient 0 mmHg (117/71 mmHg--118/67 mgHg)   Guidewire: Viper wire Diamondback atherectomy: 2.25 mm Burr  60,000 - 70,000 RPM: First past 28 seconds, second pass 26 seconds  90,000 RPM: 20 seconds, 26 seconds  120 RPM: 29  seconds, 28 seconds, 27 seconds  Post-atherectomy angiography revealed minimal improvement of the extensively calcified vessel lumen. Therefore the decision was made to proceed with direct stent placement with balloon angioplasty for balloon expansion.   Guidewire: Versicore 0.35 mm wire (exchanged via end-hole catheter) Stent: Epic Vascular over the wire 10 mm x 40 mm self-expanding stent   Post-Dilitation Balloon:  FoxCross 7 mm x 30 mm  1st Inflation:  4 Atm for 30 Sec;   2nd Inflation: 4 Atm for 30 Sec;  proximal edge stent  Post deployment angiography in AP and RAO projection demonstrated excellent stent expansion with persistent calcified lesion but reduction of the 80% stenosis to less than 30%.  Initial images suggest the possibility of a slight blush in the proximal portion of the stent however this was not noted in the RAO projection. Initially we thought to use protamine for reversal the heparin however the AST was ordered less than 70 lesion the case if was not done. Additionally the patient's blood pressure and heart rate remained stable suggesting that a perforation unlikely.  Evaluation of this lesion appeared to be occluded right common iliac artery was performed. Decision was that with a good yet suboptimal outcome on the left the results of the right would likely be not as good. Therefore decision was made to not proceed with attempting to cross the right common iliac collision.  A pullback gradient was measured using a no catheter after 200 mcg and intra-arterial mattress was administered. Additionally the gradient was measured on the right common iliac sheath.  The wire was removed out of the body and the sheaths were  sutured in place, to be removed in the recovery unit once ACT less than 160 seconds.  The patient was transported to the PACU in hemodynamically stable condition.   The patient  was stable before, during and following the procedure.   Patient did tolerate  procedure well. There were not complications. (Initial impression was there we have a small proximal perforation in the proximal portion the stent however orthogonal views made this was likely.) EBL: < 20ml  Medications:  Premedication: 5 mg  Valium, 325 mg  aspirin,  600 mg clopidogrel  Sedation:  3 mg IV Versed, 75 IV mcg Fentanyl (additionally 2 attempts of 2 mg Versed 50 mg of fentanyl were administered via peripheral IVs that had apparently infiltrated.)  Contrast:  120 ml Omnipaque  Hemodynamics:  Central Aortic Pressure / Mean Aortic Pressure: 117/66 mmHg inHg;  86 mm Hg  Left Femoral Artery Pressure/Mean Pressure:  Pre-: 85/53 mmHg; 67 mmHg  Post-: 118/67 mmHg; 87 mmHg  Impression:  Successful PTA and stenting of the Left Common Iliac Artery after suboptimal atherectomy with a diamondback atherectomy device. Reduction of his stenosis from 80% to 20-30% with no pressure gradient across the residual lesion.  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 Post procedure gradient less than 20 mm Hg  Plan:  Overnight observation post-procedure. Anticipate discharge in the morning.  He'll followup with after Lower Extremity Dopplers.  Continue aspirin and Plavix as well as cardiac medications.  We'll reassess symptoms and followup and make a decision whether or not we we would be referred for fem-fem bypass versus the medical therapy versus attempted crossing of the right common iliac depending on what his to his feelings are about surgery.   The case and results was discussed with the patient (and family).  Time Spend Directly with Patient:  70  minutes  Allisyn Kunz W, M.D., M.S. [facility] [address] [place], [place]  [address]  [phone]  10/27/2011 12:42 PM

## 2011-10-28 LAB — CBC
MCH: 31.4 pg (ref 26.0–34.0)
MCHC: 33.3 g/dL (ref 30.0–36.0)
MCV: 94.3 fL (ref 78.0–100.0)
Platelets: 189 10*3/uL (ref 150–400)

## 2011-10-28 LAB — COMPREHENSIVE METABOLIC PANEL
ALT: 27 U/L (ref 0–53)
AST: 27 U/L (ref 0–37)
Calcium: 8.3 mg/dL — ABNORMAL LOW (ref 8.4–10.5)
Creatinine, Ser: 1.18 mg/dL (ref 0.50–1.35)
GFR calc Af Amer: 72 mL/min — ABNORMAL LOW (ref >90)
GFR calc non Af Amer: 62 mL/min — ABNORMAL LOW (ref >90)
Glucose, Bld: 116 mg/dL — ABNORMAL HIGH (ref 70–99)
Sodium: 139 mEq/L (ref 135–145)
Total Protein: 5.4 g/dL — ABNORMAL LOW (ref 6.0–8.3)

## 2011-10-28 MED ORDER — CLOPIDOGREL BISULFATE 75 MG PO TABS: 75.0000 mg | ORAL_TABLET | Freq: Every day | ORAL | Status: AC

## 2011-10-28 MED ORDER — SODIUM CHLORIDE 0.9 % IV BOLUS (SEPSIS)
500.0000 mL | Freq: Once | INTRAVENOUS | Status: AC
  Administered 2011-10-28: 500 mL via INTRAVENOUS

## 2011-10-28 NOTE — Discharge Summary (Signed)
Physician Discharge Summary  Patient ID: Michael [name] MRN: [id number] DOB/AGE: 1945/12/05 66 y.o.  Admit date: 10/27/2011 Discharge date: 10/28/2011  Admission Diagnoses: PAD  Discharge Diagnoses:  Principal Problem:  *PAD (peripheral artery disease) - Right Common Iliac Occlusion, Left Common Iliac 80+% occlusion Active Problems:  COPD (chronic obstructive pulmonary disease)  CAD (coronary artery disease) - s/p CABG  Hyperlipemia  HTN (hypertension)  Hypothyroidism  S/P CABG x 2   Discharged Condition: stable  Hospital Course:  Michael [name] is a 66 y.o. male with a PMH of CAD-s/p Inferior STEMI followed by 2 Vessel CABG in Oct 2012,COPD (quit smoking @ time of MI), HTN / HLD who was also diagnosed with severe bilateral Common Iliac Artery disease -- 100% flush occlusion of RCIA with collateral filling of the External Iliac via a Lumbar collateral to the Internal Iliac providing retrograde flow along with an ~80% eccentric occlusion of the L CIA. His recovery from CABG ==> rehabilitation / exercise, has been hampered by Dyspnea on Exertion due to COPD, but more so from bilateral buttock / thigh claudication (R>L). Over the past ~ month, he has been noting more Left sided pain.  He has been evaluated with a Steffanie Dunn on Feb 15th, 2013 that was non-ischemic with ? Diaphragmatic artifact vs. Scar (although scar is likely with his known MI history). A post-operative Echocardiogram in Dec 2012 demonstrated perserved LVEF (55% improved from peri-infarct EF of ~40%) with apical hypokinesis.  He presented for Abdominal Aortic Angiogram with Bi-Iliofemoral Runoff which resulted in Diamondback Atherectomy of the Left Common Iliac Artery/ PTCA and stenting of the Left Common Iliac Artery.  He continues to complain of intermittent thigh pain which is slightly medial.  It was exacerbated when he sat up in the bed and with palpation.  He has been seen by Dr. Allyson Sabal who feels he is stable for DC  home.  Consults: None.  Significant Diagnostic Studies: Procedures Performed:  Abdominal Aortic Angiogram with Bi-Iliofemoral Runoff  Diamondback Atherectomy of the Left Common Iliac Artery  PTCA and stenting of the Left Common Iliac Artery Indication(s): Symptom limiting claudication  History: 66 y.o. male with A history of CAD-s/p Inferior STEMI followed by 2 Vessel CABG in Oct 2012,COPD (quit smoking @ time of MI), HTN / HLD who was also diagnosed with severe bilateral Common Iliac Artery disease -- 100% flush occlusion of RCIA with collateral filling of the External Iliac via a Lumbar collateral to the Internal Iliac providing retrograde flow along with an ~80% eccentric occlusion of the L CIA. His recovery from CABG ==> rehabilitation / exercise, has been hampered by Dyspnea on Exertion due to COPD, but more so from bilateral buttock / thigh claudication (R>L). Over the past ~ month, he has been noting more Left sided pain.  Consent: The procedure with Risks/Benefits/Alternatives and Indications was reviewed with the patient (and family). All questions were answered.  Risks / Complications include, but not limited to: Death, MI, CVA/TIA, VF/VT (with defibrillation), Bradycardia (need for temporary pacer placement), contrast induced nephropathy, bleeding / bruising / hematoma / pseudoaneurysm, vascular injury (with possible Vascular Surgery), adverse medication reactions, infection, compartment syndrome.  The patient (and family) voice understanding and agree to proceed.  Consent for signed by MD and patient with RN witness -- placed on chart.  Procedure: The patient was brought to the 2nd Floor [facility] in the fasting state and prepped and draped in the usual sterile fashion for (Right and Left groin.  Sterile technique was used including antiseptics, cap, gloves, gown, hand hygiene, mask and sheet.  Skin prep: Chlorhexidine.  Time Out: Verified patient  identification, verified procedure, site/side was marked, verified correct patient position, special equipment/implants available, medications/allergies/relevent history reviewed, required imaging and test results available. Performed  Bilateral femoral heads were identified using tactile and fluoroscopic technique. The left groin was anesthetized with 1% subcutaneous Lidocaine. The left Common Femoral Artery was accessed using the Modified Seldinger Technique with placement of a antimicrobial bonded/coated single lumen (7 Fr) sheath. The sheath was aspirated and flushed.  A 5 Fr Short Pigtail Catheter was advanced of over a Versicore wire into the descending Aorta to a level just above the aorto-iliac bifurcation. A power injection of 66ml/sec contrast over 1 sec was performed for Abdominal Aortic Angiography. The catheter was then pulled back to a level just above the Aortic bifurcation, and a second power injection was performed to evaluate bi-ileiofemoral arteries.  Using this angiogram as a roadmap, the right groin was anesthetized with 1% subcutaneous Lidocaine. The right Common Femoral Artery was accessed using the Modified Seldinger Technique with placement of a antimicrobial bonded/coated single lumen (6 Fr) sheath. The sheath was aspirated and flushed.  Guiding angiograms were performed glow and tell tape.  Findings:  Distal Abdominal aorta: Irregular border but no significant lesions, heavily calcified  The right distal lumbar artery is a robust large caliber vessel was gives collaterals via the right internal iliac retrograde to the right common iliac artery. The left lumbar artery this location also provides collaterals but is not as robust.  Right common iliac artery: Flush occluded at the carina. It reconstitutes just proximal to the internal/external bifurcation the aortic retrograde flow as described above.  Right internal iliac artery: Large caliber minimal luminal irregularities  Right  external iliac artery: Widely patent with brisk flow, albeit delayed. There is a 40 mm pressure gradient from the abdominal aorta.  Right common femoral artery: Proximal portion to the bifurcation it appears to be widely patent but calcified.  Left common iliac artery: Proximal heavily calcified exophytic 80% o stenosis with calcium noted on the inferior border of the vessel and a small retrograde portion of low undeveloped of the calcium is also noted.  Left internal iliac artery: Widely patent with minimal luminal irregularities  Left external iliac artery: Widely patent, and minimal luminal irregularities  Left common femoral artery: Proximal portion to the bifurcation it appears to be widely patent but calcified. PERCUTANEOUS CORONARY INTERVENTION PROCEDURE  An intravenous Heparin bolus was administered to ensure an ACT of greater than 200 seconds. (First a 5000 unit followed by 3000 unit then 2000 unit boluses were administered to ensure an ACT greater than 200.)  Premedicated with 325 mg of oral Aspirin and 600 mg Clopidogrel  5000 Units of Heparin IV was administered.  The pigtail catheter was exchanged for an angled catheter of the Versicore wire. The Versicore was removed and exchanged for the Rotablator Viper wire.   Lesion: Proximal Left Common Iliac Artery  Pre-PCI Stenosis: 80 % Post-PCI Stenosis: 20-30 %  Pre-Pressure gradient 30-35 mm Hg (117/71 mmHg--85/53 mg Hg);  Post-pressure gradient 0 mmHg (117/71 mmHg--118/67 mgHg)  Guidewire: Viper wire  Diamondback atherectomy: 2.25 mm Burr  60,000 - 70,000 RPM: First past 28 seconds, second pass 26 seconds  90,000 RPM: 20 seconds, 26 seconds  120 RPM: 29 seconds, 28 seconds, 27 seconds Post-atherectomy angiography revealed minimal improvement of the extensively calcified vessel lumen. Therefore the decision was made  to proceed with direct stent placement with balloon angioplasty for balloon expansion.  Guidewire: Versicore 0.35 mm wire  (exchanged via end-hole catheter)  Stent: Epic Vascular over the wire 10 mm x 40 mm self-expanding stent  Post-Dilitation Balloon: FoxCross 7 mm x 30 mm  1st Inflation: 4 Atm for 30 Sec;  2nd Inflation: 4 Atm for 30 Sec; proximal edge stent Post deployment angiography in AP and RAO projection demonstrated excellent stent expansion with persistent calcified lesion but reduction of the 80% stenosis to less than 30%. Initial images suggest the possibility of a slight blush in the proximal portion of the stent however this was not noted in the RAO projection. Initially we thought to use protamine for reversal the heparin however the AST was ordered less than 70 lesion the case if was not done. Additionally the patient's blood pressure and heart rate remained stable suggesting that a perforation unlikely.  Evaluation of this lesion appeared to be occluded right common iliac artery was performed. Decision was that with a good yet suboptimal outcome on the left the results of the right would likely be not as good. Therefore decision was made to not proceed with attempting to cross the right common iliac collision.  A pullback gradient was measured using a no catheter after 200 mcg and intra-arterial mattress was administered. Additionally the gradient was measured on the right common iliac sheath.  The wire was removed out of the body and the sheaths were sutured in place, to be removed in the recovery unit once ACT less than 160 seconds.  The patient was transported to the PACU in hemodynamically stable condition.  The patient was stable before, during and following the procedure.  Patient did tolerate procedure well.  There were not complications. (Initial impression was there we have a small proximal perforation in the proximal portion the stent however orthogonal views made this was likely.)  EBL: < 20ml  Medications:  Premedication: 5 mg Valium, 325 mg aspirin, 600 mg clopidogrel  Sedation: 3 mg IV  Versed, 75 IV mcg Fentanyl (additionally 2 attempts of 2 mg Versed 50 mg of fentanyl were administered via peripheral IVs that had apparently infiltrated.)  Contrast: 120 ml Omnipaque  Hemodynamics:  Central Aortic Pressure / Mean Aortic Pressure: 117/66 mmHg inHg; 86 mm Hg  Left Femoral Artery Pressure/Mean Pressure:  Pre-: 85/53 mmHg; 67 mmHg  Post-: 118/67 mmHg; 87 mmHg Impression:  Successful PTA and stenting of the Left Common Iliac Artery after suboptimal atherectomy with a diamondback atherectomy device. Reduction of his stenosis from 80% to 20-30% with no pressure gradient across the residual lesion.  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 Post procedure gradient less than 20 mm Hg Plan:  Overnight observation post-procedure. Anticipate discharge in the morning.  He'll followup with after Lower Extremity Dopplers.  Continue aspirin and Plavix as well as cardiac medications.  We'll reassess symptoms and followup and make a decision whether or not we we would be referred for fem-fem bypass versus the medical therapy versus attempted crossing of the right common iliac depending on what his to his feelings are about surgery. The case and results was discussed with the patient (and family).  Time Spend Directly with Patient:  70 minutes  HARDING,DAVID W, M.D., M.S.  [facility]  [address]  [place], [place] [address]  [phone]  10/27/2011  12:42 PM   Treatments:   Discharge Exam: Blood pressure 107/58, pulse 79, temperature 97.8 F (  36.6 C), temperature source Oral, resp. rate 19, height 5\' 10"  (1.778 m), weight 81.5 kg (179 lb 10.8 oz), SpO2 98.00%.   Disposition: 01-Home or Self Care  Discharge Orders    Future Appointments: Provider: Department: Dept Phone: Center:   12/10/2011 1:30 PM [facility] [phone] [facility]   12/10/2011 2:15 PM Sherren Kerns, MD [facility]  [phone] [facility]     Future Orders Please Complete By Expires   Diet - low sodium heart healthy      Increase activity slowly      Discharge instructions      Comments:   No driving or lifting for 3 days.     Medication List  As of 10/28/2011  9:39 AM   TAKE these medications         aspirin 325 MG EC tablet   Take 325 mg by mouth daily.      atorvastatin 40 MG tablet   Commonly known as: LIPITOR   Take 40 mg by mouth daily.      clopidogrel 75 MG tablet   Commonly known as: PLAVIX   Take 1 tablet (75 mg total) by mouth daily with breakfast.      levothyroxine 100 MCG tablet   Commonly known as: SYNTHROID, LEVOTHROID   Take 100 mcg by mouth daily.      lisinopril 2.5 MG tablet   Commonly known as: PRINIVIL,ZESTRIL   Take 2.5 mg by mouth daily.      metoprolol tartrate 25 MG tablet   Commonly known as: LOPRESSOR   Take 25 mg by mouth 2 (two) times daily.      niacin 500 MG CR tablet   Commonly known as: NIASPAN   Take 1,000 mg by mouth at bedtime.             SignedDwana Melena 10/28/2011, 9:39 AM

## 2011-10-28 NOTE — Progress Notes (Signed)
[facility]  Subjective: Continues to have intermittent left leg pain in the thigh. It was exacerbated when he sat up in the bed.  Objective: Vital signs in last 24 hours: Temp:  [97.8 F (36.6 C)-98.9 F (37.2 C)] 97.8 F (36.6 C) (03/06 0428) Pulse Rate:  [64-77] 72  (03/06 0428) Resp:  [18-20] 19  (03/06 0428) BP: (81-126)/(45-74) 85/57 mmHg (03/06 0608) SpO2:  [98 %-99 %] 98 % (03/06 0428) Weight:  [81.5 kg (179 lb 10.8 oz)-83.462 kg (184 lb)] 81.5 kg (179 lb 10.8 oz) (03/06 0428) Last BM Date: 10/27/11  Intake/Output from previous day: 03/05 0701 - 03/06 0700 In: 1950.3 [P.O.:440; I.V.:1010.3; IV Piggyback:500] Out: 775 [Urine:775] Intake/Output this shift:    Medications Current Facility-Administered Medications  Medication Dose Route Frequency Provider Last Rate Last Dose  . 0.9 %  sodium chloride infusion  1 mL/kg/hr Intravenous Continuous Marykay Lex, MD 83.5 mL/hr at 10/27/11 1444 1 mL/kg/hr at 10/27/11 1444  . 0.9 %  sodium chloride infusion   Intravenous Continuous Leone Brand, NP      . acetaminophen (TYLENOL) tablet 650 mg  650 mg Oral Q4H PRN Marykay Lex, MD      . aspirin EC tablet 325 mg  325 mg Oral Daily Marykay Lex, MD      . atorvastatin (LIPITOR) tablet 40 mg  40 mg Oral Daily Marykay Lex, MD   40 mg at 10/27/11 1618  . clopidogrel (PLAVIX) 300 MG tablet           . clopidogrel (PLAVIX) tablet 600 mg  600 mg Oral Pre-Cath Runell Gess, MD   600 mg at 10/27/11 0940  . clopidogrel (PLAVIX) tablet 75 mg  75 mg Oral Q breakfast Marykay Lex, MD      . diazepam (VALIUM) tablet 5 mg  5 mg Oral On Call Runell Gess, MD   5 mg at 10/27/11 0940  . fentaNYL (SUBLIMAZE) 0.05 MG/ML injection           . fentaNYL (SUBLIMAZE) 0.05 MG/ML injection           . heparin 1000 UNIT/ML injection           . heparin 2-0.9 UNIT/ML-% infusion           . levothyroxine (SYNTHROID, LEVOTHROID) tablet 100 mcg  100 mcg  Oral Daily Marykay Lex, MD   100 mcg at 10/27/11 1613  . lidocaine (XYLOCAINE) 1 % injection           . lisinopril (PRINIVIL,ZESTRIL) tablet 2.5 mg  2.5 mg Oral Daily Marykay Lex, MD      . metoprolol tartrate (LOPRESSOR) tablet 25 mg  25 mg Oral BID Marykay Lex, MD   25 mg at 10/27/11 1621  . midazolam (VERSED) 2 MG/2ML injection           . midazolam (VERSED) 2 MG/2ML injection           . midazolam (VERSED) 2 MG/2ML injection           . midazolam (VERSED) 2 MG/2ML injection           . morphine 2 MG/ML injection 1 mg  1 mg Intravenous Q2H PRN Marykay Lex, MD      . niacin (NIASPAN) CR tablet 1,000 mg  1,000 mg Oral QHS Marykay Lex, MD   1,000 mg at 10/27/11 2333  . ondansetron (ZOFRAN) injection  4 mg  4 mg Intravenous Q6H PRN Marykay Lex, MD      . oxyCODONE-acetaminophen [facility]) 5-325 MG per tablet 1-2 tablet  1-2 tablet Oral Q3H PRN Marykay Lex, MD   1 tablet at 10/27/11 2300  . protamine 10 MG/ML injection           . sodium chloride 0.9 % bolus 500 mL  500 mL Intravenous Once Leone Brand, NP   500 mL at 10/28/11 0454  . zolpidem (AMBIEN) tablet 5 mg  5 mg Oral QHS PRN Marykay Lex, MD      . DISCONTD: 0.9 %  sodium chloride infusion   Intravenous Continuous Runell Gess, MD 75 mL/hr at 10/27/11 0940    . DISCONTD: 0.9 %  sodium chloride infusion  250 mL Intravenous PRN Marykay Lex, MD      . DISCONTD: sodium chloride 0.9 % injection 3 mL  3 mL Intravenous Q12H Marykay Lex, MD      . DISCONTD: sodium chloride 0.9 % injection 3 mL  3 mL Intravenous PRN Marykay Lex, MD        PE: General appearance: alert, cooperative and no distress Lungs: clear to auscultation bilaterally Heart: regular rate and rhythm, S1, S2 normal, no murmur, click, rub or gallop Extremities: No LEE Pulses: Right radial 2+.  Left radial not palpable.  Hand warm.  Left PT, DP and popliteal 2+.  Right PT and DP 1+. Groins:  Tiny hematoma in right.  No bruits or  ecchymosis.  Lab Results:   Basename 10/28/11 0430 10/27/11 0906  WBC 7.1 6.6  HGB 10.4* 12.8*  HCT 31.2* 38.5*  PLT 189 247   BMET  Basename 10/28/11 0430 10/27/11 0906  NA 139 137  K 3.9 4.8  CL 110 106  CO2 26 25  GLUCOSE 116* 105*  BUN 21 19  CREATININE 1.18 0.94  CALCIUM 8.3* 9.4   PT/INR  Basename 10/27/11 0906  LABPROT 13.2  INR 0.98    Studies/Results: 10/27/11, PV angiogram, Stenting.  Impression:  Successful PTA and stenting of the Left Common Iliac Artery after suboptimal atherectomy with a diamondback atherectomy device. Reduction of his stenosis from 80% to 20-30% with no pressure gradient across the residual lesion.  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 Post procedure gradient less than 20 mm Hg  Assessment/Plan   Principal Problem:  *PAD (peripheral artery disease) - Right Common Iliac Occlusion, Left Common Iliac 80+% occlusion Active Problems:  COPD (chronic obstructive pulmonary disease)  CAD (coronary artery disease) - s/p CABG  Hyperlipemia  HTN (hypertension)  Hypothyroidism  S/P CABG x 2  Plan: S/P Successful PTA and stenting of the Left Common Iliac Artery after suboptimal atherectomy with a diamondback atherectomy device. BP 81/48 - 126/71. Hgb decreased from 12.8 to 10.4.    LOS: 1 day    HAGER,BRYAN W 10/28/2011 8:21 AM   Agree with note written by Jones Skene PAC  S/P LCIA Diamondback orbital rotational atherectomy, PTA and stenting with acceptable angiographic results given anatomy. Residual total RCIA. Will ultimately need fem-fem bypass grafting. Will get Dr. Herbie Baltimore to arrange. Hgb decreased 12.4 to 10.8 but no hematoma. OK for D/C home. LEA then ROV with Dr. Herbie Baltimore.  Runell Gess 10/28/2011 8:49 AM

## 2011-10-28 NOTE — Consult Note (Signed)
Pt quit smoking back in October 2012. He is in maintenance stage. Congratulated and encouraged pt to stay quit.

## 2011-10-28 NOTE — Plan of Care (Signed)
Problem: Consults Goal: Tobacco Cessation referral if indicated Outcome: Completed/Met Date Met:  10/28/11 Patient quit smoking and chewing tobacco, denies any problems with tobacco at this time  Problem: Phase II Progression Outcomes Goal: Ambulates up to 600 ft. in hall x 1 Outcome: Completed/Met Date Met:  10/28/11 Ambulating in halls with some buttock discomfort and bilateral hip discomfort. Symptoms discussed with Dr. Allyson Sabal this am and will be discussed in follow up with Dr. Herbie Baltimore and possibly will need referral. Patient verbalized good understanding regarding this information with Dr. Allyson Sabal after discussed.

## 2011-10-28 NOTE — Progress Notes (Signed)
BP 81/48, Tele SR 70.  Pt denies complaints, A&Ox3.  Urine lighter, now dark amber.   Nada Boozer, NP informed, reported UA results. Left groin level 0, Right groin level 1, approx 3cm hematoma medial to incision site. Has received approx 650 cc IVF. Last EF 55%.  Orders received.  500 cc NS bolus begun.  C&S added to urine in lab.

## 2011-11-23 ENCOUNTER — Encounter: Payer: Self-pay | Admitting: Vascular Surgery

## 2011-12-08 ENCOUNTER — Encounter: Payer: Self-pay | Admitting: Vascular Surgery

## 2011-12-10 ENCOUNTER — Ambulatory Visit (INDEPENDENT_AMBULATORY_CARE_PROVIDER_SITE_OTHER): Payer: Medicare Other | Admitting: Vascular Surgery

## 2011-12-10 ENCOUNTER — Encounter (INDEPENDENT_AMBULATORY_CARE_PROVIDER_SITE_OTHER): Payer: Medicare Other | Admitting: *Deleted

## 2011-12-10 ENCOUNTER — Encounter: Payer: Self-pay | Admitting: Vascular Surgery

## 2011-12-10 VITALS — BP 143/65 | HR 76 | Temp 97.3°F | Ht 70.0 in | Wt 194.0 lb

## 2011-12-10 DIAGNOSIS — I739 Peripheral vascular disease, unspecified: Secondary | ICD-10-CM

## 2011-12-10 DIAGNOSIS — I70219 Atherosclerosis of native arteries of extremities with intermittent claudication, unspecified extremity: Secondary | ICD-10-CM

## 2011-12-10 NOTE — Progress Notes (Signed)
[facility] [place] HISTORY AND PHYSICAL   History of Present Illness:  Patient is a 66 y.o. year old male who presents for follow-up evaluation for PAD.  He is on Plavix/Aspirin for antiplatelet therapy. His atherosclerotic risk factors include elevated cholesterol, hypertension, smoking (quit), and coronary artery disease.  These are all currently stable and followed by his primary care physician.  The patient currently has claudication symptoms that occur in the right buttocks about half a block.  He usually walks through the pain.  The patient denies rest pain or ulcers on the feet.  He previously had left common iliac artery stenting by Dr. Herbie Baltimore in March.  He denies any claudication symptoms in his left leg.  Past Medical History  Diagnosis Date  . CAD (coronary artery disease) 06/08/2011    S/p CABG   . Hyperlipemia   . HTN (hypertension)   . Hypothyroidism   . History of tobacco abuse   . PAD (peripheral artery disease) 06/08/2011    Right Common Iliac Occlusion, Left Common Iliac 80+% occlusion  . 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 Myocardial infarction 06/06/2011; 06/08/11  . Carotid artery occlusion   . Echocardiogram abnormal 2012    apical wall abnormality  . COPD (chronic obstructive pulmonary disease)     Past Surgical History  Procedure Date  . Cardiac catheterization   . Iliac artery stent 10/27/11    left common  . Coronary artery bypass graft 06/10/2011    off pump x2(lima)(r)thigh evh  Dr. Tyrone Sage  . Tonsillectomy 1974  . Pad     Review of Systems:  Neurologic: sensation in the feet is intact Cardiac:denies shortness of breath or chest pain Pulmonary: denies cough or wheeze  Social History History  Substance Use Topics  . Smoking status: Former Smoker -- 3.0 packs/day for 50 years    Types: Cigarettes    Quit date: 06/08/2011  . Smokeless tobacco: Former Neurosurgeon    Types: Chew    Quit date: 07/13/2003  . Alcohol  Use: 7.2 oz/week    12 Cans of beer per week    Allergies  No Known Allergies   Current Outpatient Prescriptions  Medication Sig Dispense Refill  . aspirin 325 MG EC tablet Take 325 mg by mouth daily.        Marland Kitchen atorvastatin (LIPITOR) 40 MG tablet Take 40 mg by mouth daily.      . clopidogrel (PLAVIX) 75 MG tablet Take 1 tablet (75 mg total) by mouth daily with breakfast.  30 tablet  11  . levothyroxine (SYNTHROID, LEVOTHROID) 100 MCG tablet Take 100 mcg by mouth daily.        Marland Kitchen lisinopril (PRINIVIL,ZESTRIL) 2.5 MG tablet Take 2.5 mg by mouth daily.      . metoprolol tartrate (LOPRESSOR) 25 MG tablet Take 25 mg by mouth 2 (two) times daily.       . niacin (NIASPAN) 500 MG CR tablet Take 1,000 mg by mouth at bedtime.         Physical Examination  Filed Vitals:   12/10/11 1357  BP: 143/65  Pulse: 76  Temp: 97.3 F (36.3 C)  TempSrc: Oral  Height: 5\' 10"  (1.778 m)  Weight: 194 lb (87.998 kg)    Body mass index is 27.84 kg/(m^2).  General:  Alert and oriented, no acute distress HEENT: Normal Neck: No bruit or JVD Pulmonary: Clear to auscultation bilaterally Cardiac: Regular Rate and Rhythm without murmur Neurologic:  Upper and lower extremity motor 5/5 and symmetric Extremities:  1-2+ femoral left side, absent right femoral pulse, absent popliteal and pedal pulses bilaterally Skin: no ulcer or rash  DATA: Bilateral ABIs were performed today which I reviewed and interpreted. This shows an ABI on the left of 1.05 with triphasic waveforms left side was 0.74 also with triphasic waveforms   ASSESSMENT: Right buttock claudication secondary to known right common iliac artery occlusion. I reviewed his previous arteriogram. There is retrograde filling of his right internal iliac artery. Had lengthy discussion with the patient today the possibility of a left to right femoral-femoral bypass. Currently he does not wish to have any further interventions as he states he has been recovering  from his iliac stenting as well as his coronary bypass grafting. Spoke with him that he is not currently at risk of limb loss. However, his claudication symptoms probably will not improve without revascularization. At this point he wishes to defer this for now. He will followup with repeat ABIs in 6 months.   PLAN:  See above  Fabienne Bruns, MD [facility] [place] Office: [phone] Pager: [phone]

## 2012-06-23 ENCOUNTER — Ambulatory Visit: Payer: Medicare Other | Admitting: Neurosurgery

## 2012-11-04 ENCOUNTER — Emergency Department (HOSPITAL_COMMUNITY)
Admission: EM | Admit: 2012-11-04 | Discharge: 2012-11-04 | Disposition: A | Discharge status: Home / Self Care | Payer: Medicare Other | Attending: Emergency Medicine | Admitting: Emergency Medicine

## 2012-11-04 ENCOUNTER — Encounter (HOSPITAL_COMMUNITY): Payer: Self-pay | Admitting: Family Medicine

## 2012-11-04 ENCOUNTER — Emergency Department (HOSPITAL_COMMUNITY): Payer: Medicare Other

## 2012-11-04 DIAGNOSIS — I10 Essential (primary) hypertension: Secondary | ICD-10-CM | POA: Insufficient documentation

## 2012-11-04 DIAGNOSIS — I252 Old myocardial infarction: Secondary | ICD-10-CM | POA: Insufficient documentation

## 2012-11-04 DIAGNOSIS — Z8679 Personal history of other diseases of the circulatory system: Secondary | ICD-10-CM | POA: Insufficient documentation

## 2012-11-04 DIAGNOSIS — Z7982 Long term (current) use of aspirin: Secondary | ICD-10-CM | POA: Insufficient documentation

## 2012-11-04 DIAGNOSIS — Z79899 Other long term (current) drug therapy: Secondary | ICD-10-CM | POA: Insufficient documentation

## 2012-11-04 DIAGNOSIS — R269 Unspecified abnormalities of gait and mobility: Secondary | ICD-10-CM | POA: Insufficient documentation

## 2012-11-04 DIAGNOSIS — Z951 Presence of aortocoronary bypass graft: Secondary | ICD-10-CM | POA: Insufficient documentation

## 2012-11-04 DIAGNOSIS — E785 Hyperlipidemia, unspecified: Secondary | ICD-10-CM | POA: Insufficient documentation

## 2012-11-04 DIAGNOSIS — Z7902 Long term (current) use of antithrombotics/antiplatelets: Secondary | ICD-10-CM | POA: Insufficient documentation

## 2012-11-04 DIAGNOSIS — M109 Gout, unspecified: Secondary | ICD-10-CM

## 2012-11-04 DIAGNOSIS — E039 Hypothyroidism, unspecified: Secondary | ICD-10-CM | POA: Insufficient documentation

## 2012-11-04 DIAGNOSIS — Z87891 Personal history of nicotine dependence: Secondary | ICD-10-CM | POA: Insufficient documentation

## 2012-11-04 DIAGNOSIS — I2510 Atherosclerotic heart disease of native coronary artery without angina pectoris: Secondary | ICD-10-CM | POA: Insufficient documentation

## 2012-11-04 DIAGNOSIS — J449 Chronic obstructive pulmonary disease, unspecified: Secondary | ICD-10-CM | POA: Insufficient documentation

## 2012-11-04 DIAGNOSIS — J4489 Other specified chronic obstructive pulmonary disease: Secondary | ICD-10-CM | POA: Insufficient documentation

## 2012-11-04 MED ORDER — INDOMETHACIN 50 MG PO CAPS: 50.0000 mg | ORAL_CAPSULE | Freq: Three times a day (TID) | ORAL | Status: DC

## 2012-11-04 MED ORDER — DEXAMETHASONE SODIUM PHOSPHATE 10 MG/ML IJ SOLN
10.0000 mg | Freq: Once | INTRAMUSCULAR | Status: AC
  Administered 2012-11-04: 10 mg via INTRAMUSCULAR
  Filled 2012-11-04: qty 1

## 2012-11-04 MED ORDER — KETOROLAC TROMETHAMINE 60 MG/2ML IM SOLN
60.0000 mg | Freq: Once | INTRAMUSCULAR | Status: AC
  Administered 2012-11-04: 60 mg via INTRAMUSCULAR
  Filled 2012-11-04: qty 2

## 2012-11-04 NOTE — ED Provider Notes (Signed)
History     CSN: [id number]  Arrival date & time 11/04/12  0505   First MD Initiated Contact with Patient 11/04/12 [phone]      Chief Complaint  Patient presents with  . Foot Pain    (Consider location/radiation/quality/duration/timing/severity/associated sxs/prior treatment) HPI  He emergency department with 3 days of worsening right total pain.  He has never had this kind of pain before.  The patient has a past medical history significant for coronary artery disease and peripheral arterial disease.  The patient states that he drinks approximately 4-5 beers a day which he feels is a small amount.  The patient has also been E. being a significant amount of sausage lately.  The patient does not know his family history as he states "I don't associated with them much."  He denies any fevers chills, nausea, vomiting.  He says that he is barely able to walk on his foot or put her shoe on. Denies fevers, chills, myalgias, arthralgias. Denies DOE, SOB, chest tightness or pressure, radiation to left arm, jaw or back, or diaphoresis. Denies dysuria, flank pain, suprapubic pain, frequency, urgency, or hematuria. Denies headaches, light headedness, weakness, visual disturbances. Denies abdominal pain, nausea, vomiting, diarrhea or constipation.    Past Medical History  Diagnosis Date  . CAD (coronary artery disease) 06/08/2011    S/p CABG   . Hyperlipemia   . HTN (hypertension)   . Hypothyroidism   . History of tobacco abuse   . PAD (peripheral artery disease) 06/08/2011    Right Common Iliac Occlusion, Left Common Iliac 80+% occlusion  . 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 Myocardial infarction 06/06/2011; 06/08/11  . Carotid artery occlusion   . Echocardiogram abnormal 2012    apical wall abnormality  . COPD (chronic obstructive pulmonary disease)     Past Surgical History  Procedure Laterality Date  . Cardiac catheterization    . Iliac artery stent  10/27/11    left  common  . Coronary artery bypass graft  06/10/2011    off pump x2(lima)(r)thigh evh  Dr. Tyrone Sage  . Tonsillectomy  1974  . Pad      Family History  Problem Relation Age of Onset  . Heart disease Mother   . Other Sister     stents in neck  . Other Sister     heart stents    History  Substance Use Topics  . Smoking status: Former Smoker -- 3.00 packs/day for 50 years    Types: Cigarettes    Quit date: 06/08/2011  . Smokeless tobacco: Former Neurosurgeon    Types: Chew    Quit date: 07/13/2003  . Alcohol Use: 0.0 oz/week     Comment: Pt reports drinking regularly and heavily, reports typically drinking 12-pack in a day, "but not every day"      Review of Systems  Constitutional: Negative for fever and chills.  Respiratory: Negative for chest tightness and shortness of breath.   Cardiovascular: Positive for leg swelling. Negative for chest pain and palpitations.  Gastrointestinal: Negative for nausea, vomiting and abdominal pain.  Musculoskeletal: Positive for joint swelling and gait problem.  Skin: Positive for color change.  Neurological: Negative for dizziness and weakness.     Allergies  Review of patient's allergies indicates no known allergies.  Home Medications   Current Outpatient Rx  Name  Route  Sig  Dispense  Refill  . aspirin 325 MG EC tablet   Oral   Take 325  mg by mouth daily.           Marland Kitchen atorvastatin (LIPITOR) 40 MG tablet   Oral   Take 40 mg by mouth daily.         . clopidogrel (PLAVIX) 75 MG tablet   Oral   Take 75 mg by mouth daily.         Marland Kitchen levothyroxine (SYNTHROID, LEVOTHROID) 100 MCG tablet   Oral   Take 100 mcg by mouth daily.           Marland Kitchen lisinopril (PRINIVIL,ZESTRIL) 2.5 MG tablet   Oral   Take 2.5 mg by mouth daily.         . metoprolol tartrate (LOPRESSOR) 25 MG tablet   Oral   Take 25 mg by mouth 2 (two) times daily.            BP 156/93  Pulse 88  Temp(Src) 98.4 F (36.9 C) (Oral)  SpO2 99%  Physical Exam   Nursing note and vitals reviewed. Constitutional: He appears well-developed and well-nourished. No distress.  HENT:  Head: Normocephalic and atraumatic.  Eyes: Conjunctivae are normal. No scleral icterus.  Neck: Normal range of motion. Neck supple.  Cardiovascular: Normal rate, regular rhythm and normal heart sounds.   Dorsalis pedis and posterior tibialis pulses strong bilaterally.  Pulmonary/Chest: Effort normal and breath sounds normal. No respiratory distress.  Abdominal: Soft. There is no tenderness.  Musculoskeletal: He exhibits no edema.  Heat redness and swelling of the right and TP joint.  Range of motion is severely limited due to stiffness and pain.  Some tenderness and erythema of the right instep.  No streaking.   Neurological: He is alert.  Skin: Skin is warm and dry. He is not diaphoretic.  Psychiatric: His behavior is normal.    ED Course  Procedures (including critical care time)  Labs Reviewed - No data to display Dg Ankle Complete Right  11/04/2012  *RADIOLOGY REPORT*  Clinical Data: Right foot pain and swelling  RIGHT ANKLE - COMPLETE 3+ VIEW  Comparison: Contemporaneous foot  Findings: No displaced acute fracture or dislocation. A well corticated curvilinear calcific density posterior to the tibiotalar joint may reflect sequelae of prior trauma.  Posterior calcaneal enthesopathic change.  IMPRESSION: No acute osseous finding of the right ankle.   Original Report Authenticated By: Jearld Lesch, M.D.    Dg Foot Complete Right  11/04/2012  *RADIOLOGY REPORT*  Clinical Data: Right foot swelling and pain  RIGHT FOOT COMPLETE - 3+ VIEW  Comparison: Contemporaneous ankle  Findings: No displaced fracture.  No dislocation.  Posterior calcaneal enthesopathic change.  IMPRESSION: No acute osseous finding right foot.   Original Report Authenticated By: Jearld Lesch, M.D.      1. Podagra       MDM  7:19 AM BP 156/93  Pulse 88  Temp(Src) 98.4 F (36.9 C) (Oral)   SpO2 99% Patient x-rays negative for any acute abnormalities.  Differential includes rheumatoid arthritis, osteoarthritis, septic arthritis and cellulitis.  Patient's history, physical exam and appearance of the joint suggest podagra. Pt presents with monoarticular pain, swelling and erythema.  Pt is afebrile and stable. Imaging reviewed, no evidence of occult fracture or injury. Renal function good. Pt without known peptic ulcer disease and not receiving concurrent treatment on warfarin. Pt dc with indomethacin (50 mg PO TID). Discussed that pt should respond to treatment with in 24 hour of begining treatment & likely resolve in 2-3 days.    Marland Kitchen  Arthor Captain, PA-C 11/04/12 [phone]

## 2012-11-04 NOTE — ED Notes (Signed)
Pt reports increased pain and edema in right foot that began on Tuesday of this week. Pt denies trauma to the area. Sensory intact, pedal pulse palpable. Pt has very limited ROM r/t increased pain on movement.

## 2012-11-07 NOTE — ED Provider Notes (Signed)
Medical screening examination/treatment/procedure(s) were performed by non-physician practitioner and as supervising physician I was immediately available for consultation/collaboration.  Olga M Otter, MD 11/07/12 1716 

## 2013-04-10 ENCOUNTER — Other Ambulatory Visit: Payer: Self-pay | Admitting: Cardiology

## 2013-04-10 NOTE — Telephone Encounter (Signed)
Rx was sent to pharmacy electronically. 

## 2013-04-28 ENCOUNTER — Other Ambulatory Visit: Payer: Self-pay | Admitting: Cardiology

## 2013-05-01 NOTE — Telephone Encounter (Signed)
Rx was sent to pharmacy electronically. 

## 2013-05-05 ENCOUNTER — Other Ambulatory Visit: Payer: Self-pay | Admitting: Cardiology

## 2013-05-08 ENCOUNTER — Encounter: Payer: Self-pay | Admitting: *Deleted

## 2013-05-11 NOTE — Telephone Encounter (Signed)
Rx was sent to pharmacy electronically. 

## 2013-05-15 ENCOUNTER — Encounter: Payer: Self-pay | Admitting: Cardiology

## 2013-05-16 ENCOUNTER — Encounter: Payer: Self-pay | Admitting: Cardiology

## 2013-05-16 ENCOUNTER — Ambulatory Visit (INDEPENDENT_AMBULATORY_CARE_PROVIDER_SITE_OTHER): Payer: Medicare Other | Admitting: Cardiology

## 2013-05-16 VITALS — BP 120/74 | HR 62 | Ht 70.0 in | Wt 207.2 lb

## 2013-05-16 DIAGNOSIS — Z79899 Other long term (current) drug therapy: Secondary | ICD-10-CM

## 2013-05-16 DIAGNOSIS — I70219 Atherosclerosis of native arteries of extremities with intermittent claudication, unspecified extremity: Secondary | ICD-10-CM

## 2013-05-16 DIAGNOSIS — I739 Peripheral vascular disease, unspecified: Secondary | ICD-10-CM

## 2013-05-16 DIAGNOSIS — I10 Essential (primary) hypertension: Secondary | ICD-10-CM

## 2013-05-16 DIAGNOSIS — Z951 Presence of aortocoronary bypass graft: Secondary | ICD-10-CM

## 2013-05-16 DIAGNOSIS — E782 Mixed hyperlipidemia: Secondary | ICD-10-CM

## 2013-05-16 DIAGNOSIS — I2510 Atherosclerotic heart disease of native coronary artery without angina pectoris: Secondary | ICD-10-CM

## 2013-05-16 DIAGNOSIS — J449 Chronic obstructive pulmonary disease, unspecified: Secondary | ICD-10-CM

## 2013-05-16 LAB — LIPID PANEL
HDL: 32 mg/dL — ABNORMAL LOW (ref >39)
Triglycerides: 305 mg/dL — ABNORMAL HIGH (ref <150)

## 2013-05-16 LAB — COMPREHENSIVE METABOLIC PANEL
Albumin: 4.2 g/dL (ref 3.5–5.2)
BUN: 17 mg/dL (ref 6–23)
Calcium: 9.4 mg/dL (ref 8.4–10.5)
Chloride: 105 mEq/L (ref 96–112)
Glucose, Bld: 106 mg/dL — ABNORMAL HIGH (ref 70–99)
Potassium: 4.8 mEq/L (ref 3.5–5.3)

## 2013-05-16 MED ORDER — ATORVASTATIN CALCIUM 40 MG PO TABS: ORAL_TABLET | ORAL | Status: DC

## 2013-05-16 MED ORDER — LOSARTAN POTASSIUM 25 MG PO TABS: 25.0000 mg | ORAL_TABLET | Freq: Every day | ORAL | Status: DC

## 2013-05-16 MED ORDER — METOPROLOL TARTRATE 25 MG PO TABS: ORAL_TABLET | ORAL | Status: DC

## 2013-05-16 MED ORDER — CLOPIDOGREL BISULFATE 75 MG PO TABS: 75.0000 mg | ORAL_TABLET | ORAL | Status: DC

## 2013-05-16 MED ORDER — ASPIRIN EC 81 MG PO TBEC: 81.0000 mg | DELAYED_RELEASE_TABLET | Freq: Every day | ORAL | Status: DC

## 2013-05-16 NOTE — Patient Instructions (Signed)
Decrease aspirin 81 daily  Change to taking Plavix to every other day  Stop  Lisinopril and start Losartan 25 mg daily   LABS lipid, cmp  Your physician has requested that you have a lower extremity arterial exercise duplex. During this test, exercise and ultrasound are used to evaluate arterial blood flow in the legs. Allow one hour for this exam. There are no restrictions or special instructions.  Your physician wants you to follow-up in Sept 2015 Dr Herbie Baltimore.  You will receive a reminder letter in the mail two months in advance. If you don't receive a letter, please call our office to schedule the follow-up appointment.

## 2013-05-21 DIAGNOSIS — E785 Hyperlipidemia, unspecified: Secondary | ICD-10-CM | POA: Insufficient documentation

## 2013-05-21 DIAGNOSIS — Z79899 Other long term (current) drug therapy: Secondary | ICD-10-CM | POA: Insufficient documentation

## 2013-05-21 NOTE — Assessment & Plan Note (Signed)
Last data labs from last year were very good at goal on statin. He is due for another set of labs now. We'll then order them today for a recheck.

## 2013-05-21 NOTE — Assessment & Plan Note (Addendum)
Well-controlled on current medications. Due to dry hacking cough he is having, we'll switch him from lisinopril to losartan at 25 mg daily. This may need to be up titrated as there is no real good correlating dose calculator.

## 2013-05-21 NOTE — Assessment & Plan Note (Addendum)
No active anginal symptoms. He really is more limited do to his COPD related dyspnea. He had 2 vessel CABG, and I would suspect that the graft should last based on the extent of the native disease. His preop EF is roughly 45% by LV gram to 55% by echocardiogram in December. He was having heart data symptoms of PND orthopnea to go along with a mild edema. Plan: Continue aspirin and Plavix (for secondary prevention, either to be stopped for procedures.) Along with low-dose beta blocker and ARB (after converting from ACE-I) along with statin.   Reduced at 81 mg daily to avoid excess bruising. We'll also change Plavix to every other day  He had a postoperative Myoview in February 2013 that was negative for ischemia. He should be fine unless he has worsening symptoms until least next year but would probably wait till 2017.

## 2013-05-21 NOTE — Assessment & Plan Note (Signed)
Most recent laboratory Dopplers were from about a year ago. His ABI on the right was improved after the left-sided intervention from 0.69-0.8 on the left side increasing up to 1.1. He still does have some claudication symptoms in, but he had bilateral three-vessel runoff down below. Unless he has significant right leg claudication my inclination would be to just allow for collateral circulation to the right leg as described.  Continue aggressive risk factor modifications as we are doing for CAD.

## 2013-05-21 NOTE — Assessment & Plan Note (Signed)
Monitoring statins. Will check LFTs/CMP along with lipids.

## 2013-05-21 NOTE — Assessment & Plan Note (Signed)
He is now almost 2 years out from his CABG, no residual side effects from operation.

## 2013-05-21 NOTE — Assessment & Plan Note (Signed)
His claudication is not limiting. In his right iliac is not very amenable to treatment percutaneously. The left-sided iliac was extremely calcified and despite rotational atherectomy wasn't very difficult reduced to below 30% stenosis.

## 2013-05-21 NOTE — Progress Notes (Signed)
PCP: Kaleen Mask, MD  Clinic Note: Chief Complaint  Patient presents with  . Annual Exam    no chest pain, sob , rarely swelling    HPI: Michael [name] is a 67 y.o. male with a PMH below who presents today for one-year followup of his coronary disease and peripheral vascular disease the admit him in October 2012 when he presented with what seemed like his COPD and CHF exacerbation. Evaluation then appeared to prove that he probably had out-of-hospital STEMI cardiac catheterization revealed three-vessel disease including an occluded RCA with left right collaterals to the LAD a focal 90% lesion with a 6% lesions. He is referred for 2 vessel CABG by Dr. Tyrone Sage -- LIMA-LAD, SVG to PDA.  Shortly after that we proceeded to investigate his lower extremities as the initial diagnostic cardiac catheterization revealed severe bilateral iliac artery stenosis. His left leg claudication was treated with PTA and stenting of the left internal iliac at reduced the lesion at about 20-30%. It was a very focal calcific portion. The right iliac was occluded proximally but has diffuse lumbar collateralization to to the right internal iliac the external iliac. It is also improved left to right collaterals to the pudendal system to the right internal iliac after left-sided intervention. He also has COPD that is really limiting him the most. He is now been close to 2 years from smoking cessation.  Interval History: Over the last year, he can't let himself go. He still has pretty significant dyspnea on exertion this is baseline. He simply does not do much the way of any activity. He does once or twice a week ago to work toward [employer] and pushed the car around. That's really the extent of activity. About 20 pounds in the last year. Despite having the dyspnea on exertion, he denies any chest tightness or pressure with exertion. He denies any real claudication of the left side but does have some of the right side in  the buttock area. He is however more limited by dyspnea than claudication he said he says he cannot get enough air he is been reluctant to go to his primary care provider and has been reluctant to use any COPD medications or oxygen.  He really denies any PND, orthopnea or significant edema. Mild edema here and there. His patella bike gallops, and is reluctant to use diuretics. He denies any palpitations or rapid heart beats.  The remainder of Cardiovascular ROS is as follows: positive for - dyspnea on exertion, edema and shortness of breath negative for - chest pain, irregular heartbeat, loss of consciousness, murmur, orthopnea, palpitations, paroxysmal nocturnal dyspnea or rapid heart rate : Additional cardiac review of systems: Lightheadedness - no, dizziness - no, syncope/near-syncope - no; TIA/amaurosis fugax - no Melena - no, hematochezia no; hematuria - no; nosebleeds - no; claudication - yes    Past Medical History  Diagnosis Date  . CAD (coronary artery disease) 06/08/2011    s/p CABG   . Hyperlipemia   . HTN (hypertension)   . Hypothyroidism   . History of tobacco abuse     quit 05/2011  . PAD (peripheral artery disease) 06/08/2011    Right Common Iliac Occlusion, Left Common Iliac 80+% occlusion; LEAs 04/2012 - ABI 0.8 on R and ABI 1.1 on L  . 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 Myocardial infarction 06/06/2011; 06/08/11  . Carotid artery occlusion   . Echocardiogram abnormal 2012    apical wall abnormality  .  COPD (chronic obstructive pulmonary disease)   . History of nuclear stress test 10/09/2011    lexiscan; fixed basal to mid-inf diaphragmatic artifact vs. scar w/ NO ischemia; septal hypokinesis     Prior Cardiac Evaluation and Past Surgical History: Past Surgical History  Procedure Laterality Date  . Cardiac catheterization  06/09/2011  . Iliac artery stent  10/27/11    left CIA - 58mmx40mm self-expanding stent, decreasing downt to 20-30% lesion, 100%  occluded R CIA with extensive lumbar collateralization to R internal iliac  . Coronary artery bypass graft  06/10/2011    oLIMA to LAD, SVG to PDA (Dr. Tyrone Sage)  . Tonsillectomy  1974  . Transthoracic echocardiogram  08/19/2011    EF=>55%; mild mitral annular calcif; mild TR    No Known Allergies  Current Outpatient Prescriptions  Medication Sig Dispense Refill  . atorvastatin (LIPITOR) 40 MG tablet TAKE 1 TABLET BY MOUTH EVERY NIGHT AT BEDTIME  90 tablet  3  . clopidogrel (PLAVIX) 75 MG tablet Take 1 tablet (75 mg total) by mouth every other day.  90 tablet  3  . levothyroxine (SYNTHROID, LEVOTHROID) 100 MCG tablet Take 100 mcg by mouth daily.        . metoprolol tartrate (LOPRESSOR) 25 MG tablet TAKE 1 TABLET BY MOUTH TWICE DAILY  180 tablet  3  . aspirin EC 81 MG tablet Take 1 tablet (81 mg total) by mouth daily.  90 tablet  3  . losartan (COZAAR) 25 MG tablet Take 1 tablet (25 mg total) by mouth daily.  90 tablet  3   No current facility-administered medications for this visit.    History   Social History Narrative  . No narrative on file    ROS: A comprehensive Review of Systems - Negative except Pertinent positives above and below. General ROS: positive for  - fatigue, malaise and weight gain Respiratory ROS: positive for - cough and shortness of breath negative for - hemoptysis, orthopnea, pleuritic pain or sputum changes  PHYSICAL EXAM BP 120/74  Pulse 62  Ht 5\' 10"  (1.778 m)  Wt 207 lb 3.2 oz (93.985 kg)  BMI 29.73 kg/m2; up and additional 6 pounds from last visit (which was up 11 pounds from the previous) General appearance: Alert right x3, answers questions appropriately. For a well-nourished and well-groomed. No acute distress. He still has a pot belly Neck: no adenopathy, no JVD, supple, symmetrical, trachea midline, thyroid not enlarged, symmetric, no tenderness/mass/nodules and Soft right-sided carotid bruit Lungs: Nonlabored but baseline accessory muscle use.  Increased AP diameter. theerree clearly interstitial sounds, but no true wheezes rales or rhonchi. Just diminished breath sounds throughout. Heart: regular rate and rhythm, S1, S2 normal, no murmur, click, rub or gallop and normal apical impulse Abdomen: soft, non-tender; bowel sounds normal; no masses,  no organomegaly and Mild truncal obesity Extremities: extremities normal, atraumatic, no cyanosis or edema, no edema, redness or tenderness in the calves or thighs and no ulcers, gangrene or trophic changes Pulses: Essentially normal 2+ in the left leg, but 1+ on the right DP and PT Neurologic: Grossly normal HEENT: [place]/AT, EOMI, MMM, anicteric sclera  WUJ:[id number] today: Yes Rate: 62 , Rhythm: NSR, normal ECG;   Recent Labs: 05/05/2012 lipid panel: TC 136, TD162, HDL 36, LDL 68 --essentially at goal.  ASSESSMENT / PLAN: CAD (coronary artery disease) - s/p CABG No active anginal symptoms. He really is more limited do to his COPD related dyspnea. He had 2 vessel CABG, and I would suspect that  the graft should last based on the extent of the native disease. His preop EF is roughly 45% by LV gram to 55% by echocardiogram in December. He was having heart data symptoms of PND orthopnea to go along with a mild edema. Plan: Continue aspirin and Plavix (for secondary prevention, either to be stopped for procedures.) Along with low-dose beta blocker and ARB (after converting from ACE-I) along with statin.   Reduced at 81 mg daily to avoid excess bruising. We'll also change Plavix to every other day  He had a postoperative Myoview in February 2013 that was negative for ischemia. He should be fine unless he has worsening symptoms until least next year but would probably wait till 2017.  S/P CABG x 2 He is now almost 2 years out from his CABG, no residual side effects from operation.  PAD (peripheral artery disease) - Right Common Iliac Occlusion, Left Common Iliac 80+% occlusion Most recent laboratory  Dopplers were from about a year ago. His ABI on the right was improved after the left-sided intervention from 0.69-0.8 on the left side increasing up to 1.1. He still does have some claudication symptoms in, but he had bilateral three-vessel runoff down below. Unless he has significant right leg claudication my inclination would be to just allow for collateral circulation to the right leg as described.  Continue aggressive risk factor modifications as we are doing for CAD.  Atherosclerosis 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 His claudication is not limiting. In his right iliac is not very amenable to treatment percutaneously. The left-sided iliac was extremely calcified and despite rotational atherectomy wasn't very difficult reduced to below 30% stenosis.  HTN (hypertension) Well-controlled on current medications. Due to dry hacking cough he is having, we'll switch him from lisinopril to losartan at 25 mg daily. This may need to be up titrated as there is no real good correlating dose calculator.  COPD (chronic obstructive pulmonary disease) He has relatively significant COPD, I think it wouldn't be all that bad idea for him to talk about using oxygen in order to slow the process down. I also think he may benefit from being on some management plan. He doesn't seem to be all excess about doing so. I will defer to his primary care physician, I think he was seen by a pulmonologist in the past.  Mixed hyperlipidemia Last data labs from last year were very good at goal on statin. He is due for another set of labs now. We'll then order them today for a recheck.  Encounter for long-term (current) use of other medications Monitoring statins. Will check LFTs/CMP along with lipids.   Orders Placed This Encounter  Procedures  . Comprehensive metabolic panel    Order Specific Question:  Has the patient fasted?    Answer:  Yes  . Lipid panel    Order Specific Question:   Has the patient fasted?    Answer:  Yes  . EKG 12-Lead  . Lower Extremity Arterial Duplex Bilateral    Dx PAD    Standing Status: Future     Number of Occurrences:      Standing Expiration Date: 05/16/2014    Order Specific Question:  Laterality    Answer:  Bilateral    Order Specific Question:  Where should this test be performed:    Answer:  [facility]    Followup: One year  Kailea Dannemiller W. Herbie Baltimore, M.D., M.S. [facility] [address]  [address] [place], [place]  [id number]  [phone] Pager # [phone]

## 2013-05-21 NOTE — Assessment & Plan Note (Deleted)
Last data labs from last year were very good at goal on statin. He is due for another set of labs now. We'll then order them today for a recheck. 

## 2013-05-21 NOTE — Assessment & Plan Note (Signed)
He has relatively significant COPD, I think it wouldn't be all that bad idea for him to talk about using oxygen in order to slow the process down. I also think he may benefit from being on some management plan. He doesn't seem to be all excess about doing so. I will defer to his primary care physician, I think he was seen by a pulmonologist in the past.

## 2013-05-22 ENCOUNTER — Telehealth: Payer: Self-pay | Admitting: *Deleted

## 2013-05-22 DIAGNOSIS — E782 Mixed hyperlipidemia: Secondary | ICD-10-CM

## 2013-05-22 DIAGNOSIS — Z79899 Other long term (current) drug therapy: Secondary | ICD-10-CM

## 2013-05-22 MED ORDER — FENOFIBRATE 48 MG PO TABS: 48.0000 mg | ORAL_TABLET | Freq: Every day | ORAL | Status: DC

## 2013-05-22 MED ORDER — ATORVASTATIN CALCIUM 40 MG PO TABS: ORAL_TABLET | ORAL | Status: DC

## 2013-05-22 NOTE — Telephone Encounter (Addendum)
Spoke to patient. Result given . Verbalized understanding Patient wanted to try the medication change  Lipitor 40 mg  to have 1/2 tablet and add fenofibrate 48 mg daily  Sent RX to [facility]

## 2013-05-22 NOTE — Telephone Encounter (Signed)
Message copied by Tobin Chad on Mon May 22, 2013 10:20 AM ------      Message from: [facility], DAVID      Created: Tue May 16, 2013 10:41 PM       Glucose, LDL & HDL / TC all look better - TG is up.            Lets compromise -- cut Lipitor in 1/2 & add Fenofibrate @ 48mg  OR stay as we are & reduce carbohydrate intake in diet THEN -> recheck labs in 3 months.             Marykay Lex, MD             ------

## 2013-06-02 ENCOUNTER — Ambulatory Visit (HOSPITAL_COMMUNITY)
Admission: RE | Admit: 2013-06-02 | Discharge: 2013-06-02 | Disposition: A | Discharge status: Home / Self Care | Payer: Medicare Other | Source: Ambulatory Visit | Attending: Internal Medicine | Admitting: Internal Medicine

## 2013-06-02 DIAGNOSIS — I739 Peripheral vascular disease, unspecified: Secondary | ICD-10-CM

## 2013-06-02 DIAGNOSIS — I70219 Atherosclerosis of native arteries of extremities with intermittent claudication, unspecified extremity: Secondary | ICD-10-CM | POA: Insufficient documentation

## 2013-06-02 DIAGNOSIS — I2510 Atherosclerotic heart disease of native coronary artery without angina pectoris: Secondary | ICD-10-CM

## 2013-06-02 NOTE — Progress Notes (Signed)
Arterial Duplex Completed. Kashonda Sarkisyan, BS, RDMS, RVT  

## 2013-06-12 ENCOUNTER — Telehealth: Payer: Self-pay | Admitting: *Deleted

## 2013-06-12 DIAGNOSIS — I739 Peripheral vascular disease, unspecified: Secondary | ICD-10-CM

## 2013-06-12 DIAGNOSIS — I2510 Atherosclerotic heart disease of native coronary artery without angina pectoris: Secondary | ICD-10-CM

## 2013-06-12 DIAGNOSIS — I70219 Atherosclerosis of native arteries of extremities with intermittent claudication, unspecified extremity: Secondary | ICD-10-CM

## 2013-06-12 NOTE — Telephone Encounter (Signed)
Message copied by Tobin Chad on Mon Jun 12, 2013  3:29 PM ------      Message from: Marykay Lex      Created: Tue Jun 06, 2013  9:31 PM       Stable doppler results.      Follow-up in ~12 months.            Marykay Lex, MD       ------

## 2013-06-12 NOTE — Telephone Encounter (Deleted)
Message copied by Tobin Chad on Mon Jun 12, 2013  3:39 PM ------      Message from: Marykay Lex      Created: Tue Jun 06, 2013  9:31 PM       Stable doppler results.      Follow-up in ~12 months.            Marykay Lex, MD       ------

## 2013-06-12 NOTE — Telephone Encounter (Signed)
Spoke to patient. Result given . Verbalized understanding  SCHEDULE FOR  LEA-(DOPPLER) TO BE DONE 12 MONTHS (NOV 2015)

## 2013-07-13 ENCOUNTER — Telehealth: Payer: Self-pay | Admitting: Cardiology

## 2013-07-13 NOTE — Telephone Encounter (Signed)
So, the problem is that Gemfibrozil + Atorvastatin as a combination is relatively high risk.  I would not be in favor of doing that.   Will have to go back to the drawing board & see what other options are there.  Marykay Lex, MD

## 2013-07-13 NOTE — Telephone Encounter (Signed)
Please call--concerning his medicine-needs some of it to be changed.He needs to be in a different tier.

## 2013-07-13 NOTE — Telephone Encounter (Signed)
Please call him again-he talked to you about his medicine.

## 2013-07-13 NOTE — Telephone Encounter (Signed)
Returned call.  Pt stated the insurance company gave him the name of another med.  Stated the med is gemfibrozil 600 mg and it is tier 2.  Pt informed Dr. Herbie Baltimore will be notified about medication concern.  Pt verbalized understanding and agreed w/ plan.  Pt's new pharmacy will be [facility] order [facility].     Fenobibrate will cost $95 next year w/ new insurance, [employer] (Tier 4).  Gemfibrozil is Tier 2  [facility] will be new pharmacy  Message forwarded to Dr. Herbie Baltimore. (see other phone note from today)

## 2013-07-13 NOTE — Telephone Encounter (Signed)
Returned call and pt verified x 2.  Pt stated he had to get different insurance for next year and he is taking some medication that is a Tier 4.  Stated he wants to know if Dr. Herbie Baltimore can switch it to a different tier.  Stated the med is fenofibrate.  Pt not sure which med/meds are on a lower tier.  Stated he just got his book [employer]imedia [employer]) and saw that this med was tier 4.  Pt also stated he will have to use the mail order pharmacy next year, but does not know which one.  Pt advised to call his insurance company to find out which med(s) are comparable to fenofibrate and are in a lower tier.  Pt also advised to find out which mail order pharmacy he will be using so new Rxs can be sent.  Pt verbalized understanding and agreed w/ plan.  Pt will call back with information.

## 2013-07-28 ENCOUNTER — Telehealth: Payer: Self-pay | Admitting: Cardiology

## 2013-07-28 NOTE — Telephone Encounter (Signed)
Pt calling again,would you please call.

## 2013-07-28 NOTE — Telephone Encounter (Signed)
At this rate, just d/c fenofibrate all together.  Do not want to use Gemfibrozil.  We can discuss options @ R[facility] when scheduled.  Marykay Lex, MD

## 2013-07-28 NOTE — Telephone Encounter (Signed)
Why do he need to see the doctor to have his medication change from Tier 4 to Tier 1 .... He is stating that the Fenofibrate is too expensive and why do he needs to Talk to Dr. Herbie Baltimore to have him change his medication .Marland Kitchen Please call and explain to him .Marland Kitchen     Thanks

## 2013-07-28 NOTE — Telephone Encounter (Signed)
Told to D/C Fenofibrate when he finishes the current Rx.  He will need to talk w/Dr. Herbie Baltimore about alternatives but the patient is not willing to make an appt at this time.  Told Patient to call and make an appt when it is convenient and will discuss at that time but to be careful with his diet in the mean time.  Voiced understanding.

## 2013-07-28 NOTE — Telephone Encounter (Signed)
Changing insurance to [place]. Fenofibrate if going from $4 to $95 w/[employer], he can not afford this.  Stayed on hold w/[employer] for >26 minutes they are unable to tell me what drug is on tier 2 is Gemfibrozil.  No other low cost alternative.  Will defer to Dr. Herbie Baltimore.

## 2013-07-28 NOTE — Telephone Encounter (Signed)
Please call-concerning his medicine,said he called 2 weeks ago and nobody never called back.

## 2013-07-28 NOTE — Telephone Encounter (Signed)
Thanks,  The fibrate was an adjunct to the statin.  Marykay Lex, MD

## 2013-08-16 ENCOUNTER — Telehealth: Payer: Self-pay | Admitting: *Deleted

## 2013-08-16 DIAGNOSIS — E782 Mixed hyperlipidemia: Secondary | ICD-10-CM

## 2013-08-16 DIAGNOSIS — Z79899 Other long term (current) drug therapy: Secondary | ICD-10-CM

## 2013-08-16 NOTE — Telephone Encounter (Signed)
Mailed letter - for labs 

## 2013-08-16 NOTE — Telephone Encounter (Signed)
Message copied by Tobin Chad on Wed Aug 16, 2013 10:31 AM ------      Message from: Tobin Chad      Created: Mon May 22, 2013 10:35 AM       Spoke to patient. Result given . Verbalized understanding      Patient wanted to try the medication change  Lipitor 40 mg  to have 1/2 tablet and add fenofibrate 48 mg daily       Sent RX to [facility]            Send labsilp in [facility] 23,2014 ------

## 2013-09-06 ENCOUNTER — Other Ambulatory Visit: Payer: Self-pay | Admitting: Cardiology

## 2013-09-06 ENCOUNTER — Telehealth: Payer: Self-pay | Admitting: Cardiology

## 2013-09-06 MED ORDER — ATORVASTATIN CALCIUM 20 MG PO TABS: 20.0000 mg | ORAL_TABLET | Freq: Every day | ORAL | Status: DC

## 2013-09-06 NOTE — Telephone Encounter (Signed)
SPOKE TO PATIENT. NEW PRESCRIPTION LIPITOR SENT TO PHARMACY

## 2013-09-06 NOTE — Telephone Encounter (Signed)
The pharmacist said it was not time to refill his generiic Lipitor. He only have 3 pills left,when will it be time to get it?

## 2013-12-18 ENCOUNTER — Other Ambulatory Visit: Payer: Self-pay

## 2013-12-18 MED ORDER — LEVOTHYROXINE SODIUM 100 MCG PO TABS: 100.0000 ug | ORAL_TABLET | Freq: Every day | ORAL | Status: DC

## 2013-12-18 NOTE — Telephone Encounter (Signed)
Routed to Dr. Harding to advise. 

## 2014-01-15 ENCOUNTER — Other Ambulatory Visit: Payer: Self-pay | Admitting: Cardiology

## 2014-01-16 NOTE — Telephone Encounter (Signed)
Rx was sent to pharmacy electronically. 

## 2014-05-23 ENCOUNTER — Ambulatory Visit (HOSPITAL_COMMUNITY)
Admission: RE | Admit: 2014-05-23 | Discharge: 2014-05-23 | Disposition: A | Discharge status: Home / Self Care | Payer: Medicare HMO | Source: Ambulatory Visit | Attending: Cardiovascular Disease | Admitting: Cardiovascular Disease

## 2014-05-23 DIAGNOSIS — I739 Peripheral vascular disease, unspecified: Secondary | ICD-10-CM

## 2014-05-23 DIAGNOSIS — I70219 Atherosclerosis of native arteries of extremities with intermittent claudication, unspecified extremity: Secondary | ICD-10-CM

## 2014-05-23 DIAGNOSIS — I2510 Atherosclerotic heart disease of native coronary artery without angina pectoris: Secondary | ICD-10-CM

## 2014-05-23 NOTE — Progress Notes (Signed)
Lower Extremity Arterial Duplex Completed. °Brianna L Mazza,RVT °

## 2014-05-24 ENCOUNTER — Other Ambulatory Visit: Payer: Self-pay | Admitting: Cardiology

## 2014-05-24 NOTE — Telephone Encounter (Signed)
Rx was sent to pharmacy electronically. 

## 2014-05-25 ENCOUNTER — Telehealth: Payer: Self-pay | Admitting: *Deleted

## 2014-05-25 NOTE — Telephone Encounter (Signed)
Message copied by Tobin ChadMARTIN, Sherrey [place] V. on Fri May 25, 2014 10:52 AM ------      Message from: Marykay LexHARDING, DAVID W      Created: Thu May 24, 2014  9:57 PM       Relatively stable LEA Dopplers with known R Int Iliac A occlusion & only ~50% effective PTA-stent of R ICA.        No real change - can recheck in 1 yr, unless symptoms worsen.            Marykay LexHARDING,DAVID W, MD       ------

## 2014-05-25 NOTE — Telephone Encounter (Signed)
Left message results wil be given at appointment Appointment 05/28/14

## 2014-05-28 ENCOUNTER — Ambulatory Visit (INDEPENDENT_AMBULATORY_CARE_PROVIDER_SITE_OTHER): Payer: Medicare HMO | Admitting: Cardiology

## 2014-05-28 ENCOUNTER — Encounter: Payer: Self-pay | Admitting: Cardiology

## 2014-05-28 VITALS — BP 122/80 | HR 57 | Ht 70.0 in | Wt 205.0 lb

## 2014-05-28 DIAGNOSIS — Z951 Presence of aortocoronary bypass graft: Secondary | ICD-10-CM

## 2014-05-28 DIAGNOSIS — I10 Essential (primary) hypertension: Secondary | ICD-10-CM

## 2014-05-28 DIAGNOSIS — I739 Peripheral vascular disease, unspecified: Secondary | ICD-10-CM

## 2014-05-28 DIAGNOSIS — E785 Hyperlipidemia, unspecified: Secondary | ICD-10-CM

## 2014-05-28 DIAGNOSIS — I2510 Atherosclerotic heart disease of native coronary artery without angina pectoris: Secondary | ICD-10-CM

## 2014-05-28 NOTE — Progress Notes (Signed)
PCP: Quintella Reichert, MD  Clinic Note: Chief Complaint  Patient presents with  . Annual Exam    13 MONTH  VISIT ,RESULT DOPPLER, NO CHEST APIN ,  SOB-- COPD, NO EDEMA   HPI: Michael [name] is a 68 y.o. male with a PMH below who presents today for annual follow up of his CAD and PVD. I first met him back in 2012 and presented late presentation for a probable ST elevation MI - that was considered to be a non-ST elevation MI. He had severe 2 vessel disease and underwent CABG. He is also known to have severe aortoiliac disease. He went to stage atherectomy and stenting of his left common iliac artery which improved collateralization to the right. He is clinically declined aorto-femoral bypass for the occluded right common iliac artery that had extensive collaterals from both lumbar arteries as well as from the left internal iliac and pudendal artery.  Past Medical History  Diagnosis Date  . History of non-ST elevation myocardial infarction (NSTEMI) 06/05/2011    With CHF - probably late presentation for missed STEMI --> CATH- MV CAD;; Echo 07/2001: EF > 55%, mild MAC, mild TR, no WMA  with Post-op Setpal dyskinesis, Apex not seen)  . CAD, multiple vessel 05/2011    90% mid LAD with diffuse proximal stenosis, 100% proximal RCA --> s/p CABG; b) Lexiscan Myoview 09/2011: fixed inferior defect (? artifact), no ischemia, EF 69%, no WMA  . 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 Essential hypertension   . Hyperlipidemia with target LDL less than 70   . PAD (peripheral artery disease) 06/08/2011    a) R Common Iliac 100%, L Common Iliac 80+% occlusion; b) L CIA - Aterectomy- 10 mm x 40 mm self-extending Stent (postdilated to 7 mm with 20-30% residual)--> Deferred R AoFem bypass;; c) LEA Dopplers 04/2012 & 04/2014 - RABI 0.8 & LABI 1.1 - STABLE, no change  . Carotid artery occlusion   . Hypothyroidism   . History of tobacco abuse     quit 05/2011  . COPD (chronic obstructive pulmonary disease)  with emphysema    Interval History: He presents today in followup, having just had his repeat emotionally Dopplers confirming stable findings. He basically has a stable baseline exertional dyspnea that really limits him from doing too much of any activity. He attributes the dyspnea mostly to his baseline COPD. He denies any PND or orthopnea or major edema that would suggest CHF involvement.  He denies any anginal chest discomfort with either rest or exertion. He does have stable mild claudication on the right leg,  remains no change since the left common iliac intervention.  No claudication on the left side. He has not had any neurologic symptoms of cerebrovascular disease including TIA or amaurosis fugax. No syncope or near syncope. He has had some orthostatic dizziness. Mostly because of his inability to evacuate any activity, he has gained weight. He has also remained abstinent of cigarettes.  ROS: A comprehensive was performed. Review of Systems  Constitutional: Negative for malaise/fatigue.  HENT: Negative for nosebleeds.   Eyes: Negative for blurred vision and double vision.  Respiratory: Positive for cough and shortness of breath. Negative for hemoptysis.   Cardiovascular: Positive for claudication.  Gastrointestinal: Negative for blood in stool and melena.  Neurological: Positive for dizziness. Negative for sensory change, speech change, focal weakness, seizures and loss of consciousness.  Endo/Heme/Allergies: Does not bruise/bleed easily.  Psychiatric/Behavioral: Negative for depression. The patient is  not nervous/anxious.   All other systems reviewed and are negative.   Current Outpatient Prescriptions on File Prior to Visit  Medication Sig Dispense Refill  . aspirin EC 81 MG tablet Take 1 tablet (81 mg total) by mouth daily.  90 tablet  3  . atorvastatin (LIPITOR) 20 MG tablet Take 1 tablet (20 mg total) by mouth daily.  30 tablet  11  . clopidogrel (PLAVIX) 75 MG tablet Take 1  tablet (75 mg total) by mouth every other day.  90 tablet  3  . fenofibrate (TRICOR) 48 MG tablet TAKE 1 TABLET BY MOUTH ONCE DAILY  30 tablet  4  . levothyroxine (SYNTHROID, LEVOTHROID) 100 MCG tablet Take 1 tablet (100 mcg total) by mouth daily.  90 tablet  3  . losartan (COZAAR) 25 MG tablet Take 1 tablet (25 mg total) by mouth daily. Must keep appointment 05/28/2014 for future refills.  30 tablet  0  . metoprolol tartrate (LOPRESSOR) 25 MG tablet TAKE 1 TABLET BY MOUTH TWICE DAILY  180 tablet  3   No current facility-administered medications on file prior to visit.   Allergies  Allergen Reactions  . Ace Inhibitors Cough   SOCIAL AND FAMILY HISTORY REVIEWED IN EPIC -- no change  Wt Readings from Last 3 Encounters:  05/28/14 205 lb (92.987 kg)  05/16/13 207 lb 3.2 oz (93.985 kg)  12/10/11 194 lb (87.998 kg)    PHYSICAL EXAM BP 122/80  Pulse 57  Ht 5' 10" (1.778 m)  Wt 205 lb (92.987 kg)  BMI 29.41 kg/m2 General appearance: Alert right x3, answers questions appropriately. Well-groomed. No acute distress. He still has a pot belly  HEENT: [place]/AT, EOMI, MMM, anicteric sclera Neck: no adenopathy, no JVD, supple, Soft right-sided carotid bruit  Lungs: Nonlabored but baseline accessory muscle use. Increased AP diameter. Mild interstitial sounds, but no true wheezes rales or rhonchi. Just diminished breath sounds throughout.  Heart: regular rate and rhythm, S1, S2 normal, no murmur, click, rub or gallop and normal apical impulse  Abdomen: soft, non-tender; bowel sounds normal; no masses, no organomegaly and Mild truncal obesity  Extremities: extremities normal, atraumatic, no cyanosis or edema, no edema, redness or tenderness in the calves or thighs and no ulcers, gangrene or trophic changes  Pulses: Essentially normal 2+ in the left leg, but 1+ on the right DP and PT  Neurologic: Grossly normal    Adult ECG Report  Rate: 57 ;  Rhythm: normal sinus rhythm  Narrative Interpretation:  Otherwise normal EKG  Recent Labs:  March and October 2015    Total Cholesterol   HDL   LDL   triglycerides   September 2014  154   32   61   305  March 2015   144   34   74   178  October 2015   145   37   79   145   ASSESSMENT / PLAN: CAD, multiple vessel - status post CABG: LIMA-LAD, SVG-RPDA Doing well with no active anginal symptoms. Again this is probably more related to COPD. Improved EF on post MI echo. No recurrent anginal or heart failure symptoms. Continue Plavix, but okay to stop aspirin for increased bruising.  He is on an ARB and beta blocker at stable dose. Also on statin.  PAD (peripheral artery disease) - Right Common Iliac Occlusion, Left Common Iliac 80+% occlusion Stable findings on Dopplers. Stable claudication symptoms. He continues to be fine with the decision to not undergo right  aorto-femoral bypass surgery. He had pretty good collateral flow to the right side. Continue aggressive risk factor modification. Continue to walk as tolerated. Followup Dopplers ordered for next year. I think after next year we can probably go to every other year he remains stable.  Hyperlipidemia with target LDL less than 70 He does seem to be relatively well controlled on low-dose statin plus fenofibrate. Continue statin, but we may need to stop his fenofibrate due to financial issues. We can look for a generic version on his formulary -- this is notably improved his triglycerides on followup lipids. He is pretty much otherwise a goal  Essential hypertension Well-controlled on low-dose beta blocker and ARB.    Orders Placed This Encounter  Procedures  . EKG 12-Lead  . Lower Extremity Arterial Doppler Bilateral    Standing Status: Future     Number of Occurrences:      Standing Expiration Date: 05/28/2015    Scheduling Instructions:     ANNUAL TEST - DX PAD    Order Specific Question:  Laterality    Answer:  Bilateral    Order Specific Question:  Where should this test be  performed:    Answer:  [facility]   No orders of the defined types were placed in this encounter.    Followup: 6 months   Britanee Vanblarcom W, M.D., M.S. Interventional Cardiologist   Pager # [phone]

## 2014-05-28 NOTE — Patient Instructions (Addendum)
SCHEDHULE FOR SEPT 2016--- Your physician has requested that you have a lower extremity arterial exercise duplex. During this test, exercise and ultrasound are used to evaluate arterial blood flow in the legs. Allow one hour for this exam. There are no restrictions or special instructions.  HOLD ATORVASTATIN (LIPITOR) FOR 2 MONTHS - DU E TO POSSIBLE MEMORY ISSUES CONTINUE WITH CURRENT MEDICATIONS.   Your physician wants you to follow-up in12 months DR HARDING.--30 MON APPT  You will receive a reminder letter in the mail two months in advance. If you don't receive a letter, please call our office to schedule the follow-up appointment.

## 2014-05-29 ENCOUNTER — Encounter: Payer: Self-pay | Admitting: Cardiology

## 2014-05-29 NOTE — Assessment & Plan Note (Signed)
Doing well with no active anginal symptoms. Again this is probably more related to COPD. Improved EF on post MI echo. No recurrent anginal or heart failure symptoms. Continue Plavix, but okay to stop aspirin for increased bruising.  He is on an ARB and beta blocker at stable dose. Also on statin.

## 2014-05-29 NOTE — Assessment & Plan Note (Signed)
He does seem to be relatively well controlled on low-dose statin plus fenofibrate. Continue statin, but we may need to stop his fenofibrate due to financial issues. We can look for a generic version on his formulary -- this is notably improved his triglycerides on followup lipids. He is pretty much otherwise a goal

## 2014-05-29 NOTE — Assessment & Plan Note (Addendum)
Stable findings on Dopplers. Stable claudication symptoms. He continues to be fine with the decision to not undergo right aorto-femoral bypass surgery. He had pretty good collateral flow to the right side. Continue aggressive risk factor modification. Continue to walk as tolerated. Followup Dopplers ordered for next year. I think after next year we can probably go to every other year he remains stable.

## 2014-05-29 NOTE — Assessment & Plan Note (Signed)
Well-controlled on low-dose beta blocker and ARB.

## 2014-06-05 ENCOUNTER — Other Ambulatory Visit: Payer: Self-pay | Admitting: Cardiology

## 2014-06-18 ENCOUNTER — Other Ambulatory Visit: Payer: Self-pay | Admitting: Cardiology

## 2014-06-25 ENCOUNTER — Other Ambulatory Visit: Payer: Self-pay | Admitting: Cardiology

## 2014-06-25 NOTE — Telephone Encounter (Signed)
Rx was sent to pharmacy electronically. 

## 2014-07-16 ENCOUNTER — Encounter: Payer: Self-pay | Admitting: Cardiology

## 2014-07-16 NOTE — Progress Notes (Signed)
Quick Note:  Labs look pretty good - lipids are almost @ goal. TSH was a bit low - PCP may recheck or order additional studies.  Marykay LexHARDING,DAVID W, MD   ______

## 2014-07-17 ENCOUNTER — Telehealth: Payer: Self-pay | Admitting: *Deleted

## 2014-07-17 NOTE — Telephone Encounter (Signed)
-----   Message from Marykay Lexavid W Harding, MD sent at 07/16/2014  5:48 PM EST ----- Labs look pretty good - lipids are almost @ goal. TSH was a bit low - PCP may recheck or order additional studies.  Marykay LexHARDING,DAVID W, MD

## 2014-07-17 NOTE — Telephone Encounter (Signed)
Spoke to patient. Result given . Verbalized understanding  Patient states primary doctor told him to stop aspirin and  thyroid will be recheck.

## 2014-07-17 NOTE — Telephone Encounter (Signed)
Returning your call. °

## 2014-07-17 NOTE — Telephone Encounter (Signed)
Left message to call back- regarding labs.  continue with current medications per Dr Herbie BaltimoreHARDING.

## 2014-08-02 ENCOUNTER — Encounter (HOSPITAL_COMMUNITY): Payer: Self-pay | Admitting: Cardiovascular Disease

## 2014-09-15 ENCOUNTER — Other Ambulatory Visit: Payer: Self-pay | Admitting: Cardiology

## 2014-09-16 NOTE — Telephone Encounter (Signed)
Rx(s) sent to pharmacy electronically.  

## 2014-09-18 ENCOUNTER — Telehealth: Payer: Self-pay | Admitting: Cardiology

## 2014-09-18 ENCOUNTER — Other Ambulatory Visit: Payer: Self-pay | Admitting: Cardiology

## 2014-09-18 MED ORDER — FENOFIBRATE 48 MG PO TABS: 48.0000 mg | ORAL_TABLET | Freq: Every day | ORAL | Status: DC

## 2014-09-18 NOTE — Telephone Encounter (Signed)
Fenofibrate refilled 06/18/14 #90 with 2 refills

## 2014-09-18 NOTE — Telephone Encounter (Signed)
Pt is calling about his medicine,he have not received. He does not know the name of it.He said the pharmacist said they have called twice.

## 2014-09-18 NOTE — Telephone Encounter (Signed)
Fenofibrate refilled #90 with 2 refills 06/18/14

## 2014-09-18 NOTE — Telephone Encounter (Signed)
Called pt, verified med needed, submitted to pharmacy via e-script. Pt voiced understanding.

## 2015-01-16 ENCOUNTER — Other Ambulatory Visit: Payer: Self-pay | Admitting: Cardiology

## 2015-01-16 NOTE — Telephone Encounter (Signed)
Rx has been sent to the pharmacy electronically. ° °

## 2015-05-23 ENCOUNTER — Other Ambulatory Visit: Payer: Self-pay | Admitting: Cardiology

## 2015-05-23 DIAGNOSIS — I739 Peripheral vascular disease, unspecified: Secondary | ICD-10-CM

## 2015-05-23 DIAGNOSIS — I771 Stricture of artery: Secondary | ICD-10-CM

## 2015-05-29 ENCOUNTER — Other Ambulatory Visit: Payer: Self-pay | Admitting: Cardiology

## 2015-05-29 NOTE — Telephone Encounter (Signed)
REFILL 

## 2015-05-31 ENCOUNTER — Ambulatory Visit (HOSPITAL_COMMUNITY)
Admission: RE | Admit: 2015-05-31 | Discharge: 2015-05-31 | Disposition: A | Discharge status: Home / Self Care | Payer: Medicare HMO | Source: Ambulatory Visit | Attending: Cardiovascular Disease | Admitting: Cardiovascular Disease

## 2015-05-31 DIAGNOSIS — I771 Stricture of artery: Secondary | ICD-10-CM | POA: Diagnosis present

## 2015-05-31 DIAGNOSIS — I10 Essential (primary) hypertension: Secondary | ICD-10-CM | POA: Diagnosis not present

## 2015-05-31 DIAGNOSIS — I739 Peripheral vascular disease, unspecified: Secondary | ICD-10-CM | POA: Insufficient documentation

## 2015-05-31 DIAGNOSIS — I748 Embolism and thrombosis of other arteries: Secondary | ICD-10-CM | POA: Insufficient documentation

## 2015-05-31 DIAGNOSIS — F17200 Nicotine dependence, unspecified, uncomplicated: Secondary | ICD-10-CM | POA: Diagnosis not present

## 2015-05-31 DIAGNOSIS — E785 Hyperlipidemia, unspecified: Secondary | ICD-10-CM | POA: Diagnosis not present

## 2015-06-04 ENCOUNTER — Telehealth: Payer: Self-pay | Admitting: *Deleted

## 2015-06-04 NOTE — Telephone Encounter (Signed)
-----   Message from Marykay Lex, MD sent at 06/04/2015  3:03 PM EDT ----- Stable Abdominal Aorta doppler Patent iliac stents.  Marykay Lex, MD   PLS FWD to PCP user Robb Matar, MD

## 2015-06-04 NOTE — Telephone Encounter (Signed)
Spoke to patient. Result given . Verbalized understanding Forward to Dr Robb Matar

## 2015-06-04 NOTE — Telephone Encounter (Signed)
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  Pls forward to PCP  Robb Matar, MD

## 2015-06-17 NOTE — Progress Notes (Signed)
PCP: Robb MatarHOMAS, MILLARD, MD  Clinic Note: Chief Complaint  Patient presents with  . Annual Exam    pt c/o SOB on exertion  . Dizziness    pt states he thinks it is from meds, he can just be walking and get lightheaded/been happening since operation 4 years ago, only happens occasionally    HPI: Michael [name] is a 69 y.o. male with a PMH below who presents today for annual f/u of CAD-CABG & PAD-L Iliac Stent & 100% CTO of R Iliac with significant lumbar collaterals.  He has baseline DOE from COPD that pre-dates his MI.  This limits his daily activity level.  Michael [name].  Recent Hospitalizations: None  Studies Reviewed: LEA Dopplers  Interval History: He is doing "OK". Breathing gets a little worse every year.  Not yet ready for home O2.   Was told that he would need to see a specialist.  Is upset though, because he just cannot walk anywhere.  Limited by SOB & not claudication. Once & a while, his chest feels "funny" - mostly sitting. Occasionally "dizzyheaded" - not associated with any activity -- no syncope, but feels like near syncope. No further episodes similar to his MI angina - pain across upper chest / neck to shoulders. Today she just feels too sleepy all the time, and has no energy. He is most limited by exertional dyspnea but no angina or claudication. No PND, orthopnea or edema.  No sensation of palpitations. No TIA/amaurosis fugax symptoms.  ROS: A comprehensive was performed. Review of Systems  Constitutional: Positive for malaise/fatigue (sleepy all of the time).  HENT: Negative for nosebleeds.   Respiratory: Positive for cough.   Cardiovascular: Negative for claudication (not since leg stent).  Gastrointestinal: Negative for blood in stool and melena.  Musculoskeletal: Negative for myalgias and falls.  Neurological: Positive for dizziness. Negative for headaches.  Psychiatric/Behavioral: Negative for depression.  All other systems  reviewed and are negative.   Past Medical History  Diagnosis Date  . History of non-ST elevation myocardial infarction (NSTEMI) 06/05/2011    With CHF - probably late presentation for missed STEMI --> CATH- MV CAD;; Echo 07/2001: EF > 55%, mild MAC, mild TR, no WMA  with Post-op Setpal dyskinesis, Apex not seen)  . CAD, multiple vessel 05/2011    90% mid LAD with diffuse proximal stenosis, 100% proximal RCA --> s/p CABG; b) Lexiscan Myoview 09/2011: fixed inferior defect (? artifact), no ischemia, EF 69%, no WMA  . 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 Essential hypertension   . Hyperlipidemia with target LDL less than 70   . PAD (peripheral artery disease) 06/08/2011    a) R Common Iliac 100%, L Common Iliac 80+% occlusion; b) L CIA - Aterectomy- 10 mm x 40 mm self-extending Stent (postdilated to 7 mm with 20-30% residual)--> Deferred R AoFem bypass;; c) LEA Dopplers 04/2012 & 9/[name] - RABI 0.8 & LABI 1.1 - STABLE, no change  . Carotid artery occlusion   . Hypothyroidism   . History of tobacco abuse     quit 05/2011  . COPD (chronic obstructive pulmonary disease) with emphysema     Past Surgical History  Procedure Laterality Date  . Cardiac catheterization  06/09/2011  . Iliac artery stent  10/27/11    left CIA - 2210mmx40mm self-expanding stent, decreasing downt to 20-30% lesion, 100% occluded R CIA with extensive lumbar collateralization to R internal iliac  .  Coronary artery bypass graft  06/10/2011    oLIMA to LAD, SVG to PDA (Dr. Tyrone Sage)  . Tonsillectomy  1974  . Transthoracic echocardiogram  08/19/2011    EF=>55%; mild mitral annular calcif; mild TR  . Nm myoview ltd  February 2013    LOW RISK: Fixed basal-mid inferior diaphragmatic attenuation artifact. No ischemia. EF 69%.  . Percutaneous stent intervention Left 10/27/2011    Procedure: PERCUTANEOUS STENT INTERVENTION;  Surgeon: Runell Gess, MD;  Location: [facility] CATH LAB;  Service: Cardiovascular;  Laterality: Left;     Prior to Admission medications   Medication Sig Start Date End Date Taking? Authorizing Provider  atorvastatin (LIPITOR) 20 MG tablet TAKE 1 TABLET BY MOUTH DAILY 09/16/14   Marykay Lex, MD  clopidogrel (PLAVIX) 75 MG tablet TAKE 1 TABLET BY MOUTH EVERY OTHER DAY 06/05/14   Marykay Lex, MD  fenofibrate (TRICOR) 48 MG tablet Take 1 tablet (48 mg total) by mouth daily. 09/18/14   Marykay Lex, MD  levothyroxine (SYNTHROID, LEVOTHROID) 100 MCG tablet TAKE 1 TABLET BY MOUTH ONCE DAILY 01/16/15   Marykay Lex, MD  losartan (COZAAR) 25 MG tablet TAKE 1 TABLET BY MOUTH DAILY 05/29/15   Marykay Lex, MD  metoprolol tartrate (LOPRESSOR) 25 MG tablet TAKE 1 TABLET BY MOUTH TWICE DAILY 06/18/14   Marykay Lex, MD    Allergies  Allergen Reactions  . Ace Inhibitors Cough   Social History   Social History  . Marital Status: Divorced    Spouse Name: N/A  . Number of Children: 3  . Years of Education: N/A   Social History Main Topics  . Smoking status: Former Smoker -- 3.00 packs/day for 50 years    Types: Cigarettes    Quit date: 06/08/2011  . Smokeless tobacco: Former Neurosurgeon    Types: Chew    Quit date: 07/13/2003  . Alcohol Use: 0.0 oz/week     Comment: Pt reports drinking regularly and heavily, reports typically drinking 12-pack in a day, "but not every day"  . Drug Use: No  . Sexual Activity: No   Other Topics Concern  . Not on file   Social History Narrative   Family History  Problem Relation Age of Onset  . Heart disease Mother     valve replacement  . Diabetes Mother   . Heart failure Mother   . CAD Sister     cardiac stent  . Stroke Sister     stents in neck  . Pneumonia Father    Wt Readings from Last 3 Encounters:  06/18/15 200 lb 14.4 oz (91.128 kg)  05/28/14 205 lb (92.987 kg)  05/16/13 207 lb 3.2 oz (93.985 kg)   PHYSICAL EXAM BP 112/82 mmHg  Pulse 86  Ht  (1.753 m)  Wt 200 lb 14.4 oz (91.128 kg)  BMI 29.65 kg/m2  SpO2 95% Neck: no  adenopathy, no JVD, supple, Soft right-sided carotid bruit  Lungs: Nonlabored but baseline accessory muscle use. Increased AP diameter. Mild interstitial sounds, but no true wheezes rales or rhonchi. Just diminished breath sounds throughout.  Heart: regular rate and rhythm, S1, S2 normal, no murmur, click, rub or gallop and normal apical impulse  Abdomen: soft, non-tender; bowel sounds normal; no masses, no organomegaly and Mild truncal obesity  Extremities: extremities normal, atraumatic, no cyanosis or edema, no edema, redness or tenderness in the calves or thighs and no ulcers, gangrene or trophic changes  Pulses: Essentially normal 2+ in the left  leg, but 1+ on the right DP and PT  Neurologic: Grossly normal    Adult ECG Report  Rate: 86 ;  Rhythm: normal sinus rhythm, sinus arrhythmia and premature ventricular contractions (PVC); Non-specific ST-T wave abnormalities.  Narrative Interpretation: Stable EKG   Other studies Reviewed: Additional studies/ records that were reviewed today include:  Recent Labs:  Lipids from PCP -- TC 142, TG 112, HDL 38, LDL 82   ASSESSMENT / PLAN: Problem List Items Addressed This Visit    S/P CABG x 2 (Chronic)    Last stress test was in 2013. Would consider discussing followup stress test at next annual visit      Relevant Orders   EKG 12-Lead   PAD (peripheral artery disease) - Right Common Iliac Occlusion, Left Common Iliac 80+% occlusion - Primary (Chronic)    No claudication despite reduced ABIs. Stable overall findings. Follow up annually. Since he is limited more by exertional dyspnea than claudication, I agree with the concept of expected management and not proceeding with femoral bypass. Recommended continued walking. Other cardiovascular risk modification as the      Relevant Medications   losartan (COZAAR) 25 MG tablet   fenofibrate (TRICOR) 48 MG tablet   metoprolol tartrate (LOPRESSOR) 25 MG tablet   Other Relevant Orders    EKG 12-Lead   Hypothyroidism (Chronic)    His PCP is reduced his dose of Synthroid to 50 mcg. Somehow my name is on his prescription for this, I have asked that this patient changed back over to his PCP as I am not managing his hypothyroidism.      Relevant Medications   metoprolol tartrate (LOPRESSOR) 25 MG tablet   Hyperlipidemia with target LDL less than 70 (Chronic)    Relatively well-controlled. Not quite at goal. May need increased dose of fenofibrate versus atorvastatin. Labs are being followed by PCP.      Relevant Medications   losartan (COZAAR) 25 MG tablet   fenofibrate (TRICOR) 48 MG tablet   metoprolol tartrate (LOPRESSOR) 25 MG tablet   Other Relevant Orders   EKG 12-Lead   Essential hypertension (Chronic)    Well controlled. With some occasional dizziness, we will switch his losartan dose in the evening and reduce to 12.5 mg       Relevant Medications   losartan (COZAAR) 25 MG tablet   fenofibrate (TRICOR) 48 MG tablet   metoprolol tartrate (LOPRESSOR) 25 MG tablet   Other Relevant Orders   EKG 12-Lead   COPD (chronic obstructive pulmonary disease) (HCC) (Chronic)    History of significant COPD. We have elected in today and his oxygen saturation decreased to less than 87%. He would qualify for home oxygen, but he states that he was currently referred to a pulmonologist for this. I don't think it requires a pulmonologist to do home oxygen, I think he would definitely benefit from a quality of life perspective. He is not psychologically ready to start home oxygen however.      Chronic respiratory failure with hypoxia (HCC) - SaO2 drops to 87% with ambulation (Chronic)   CAD, multiple vessel - status post CABG: LIMA-LAD, SVG-RPDA (Chronic)    No anginal symptoms since his CABG. Despite having oxygen desaturation to 87% with ambulation, he is not having any of his angina. On beta blocker. Reducing dose of ARB for dizziness. On statin and Plavix. Stopped aspirin last  visit  For bruising.      Relevant Medications   losartan (COZAAR) 25 MG tablet  fenofibrate (TRICOR) 48 MG tablet   metoprolol tartrate (LOPRESSOR) 25 MG tablet   Other Relevant Orders   EKG 12-Lead      Current medicines are reviewed at length with the patient today. (+/- concerns) thinks that he is having side-effects of medications. The following changes have been made:  Change Losartan to PM -- reduce to 12.5 mg   Studies Ordered:   Orders Placed This Encounter  Procedures  . EKG 12-Lead     Marykay Lex, M.D., M.S. Interventional Cardiologist   Pager # [phone]

## 2015-06-18 ENCOUNTER — Ambulatory Visit (INDEPENDENT_AMBULATORY_CARE_PROVIDER_SITE_OTHER): Payer: Medicare HMO | Admitting: Cardiology

## 2015-06-18 ENCOUNTER — Encounter: Payer: Self-pay | Admitting: Cardiology

## 2015-06-18 VITALS — BP 112/82 | HR 86 | Ht 69.0 in | Wt 200.9 lb

## 2015-06-18 DIAGNOSIS — E785 Hyperlipidemia, unspecified: Secondary | ICD-10-CM | POA: Diagnosis not present

## 2015-06-18 DIAGNOSIS — J438 Other emphysema: Secondary | ICD-10-CM

## 2015-06-18 DIAGNOSIS — I739 Peripheral vascular disease, unspecified: Secondary | ICD-10-CM

## 2015-06-18 DIAGNOSIS — I10 Essential (primary) hypertension: Secondary | ICD-10-CM | POA: Diagnosis not present

## 2015-06-18 DIAGNOSIS — I2510 Atherosclerotic heart disease of native coronary artery without angina pectoris: Secondary | ICD-10-CM

## 2015-06-18 DIAGNOSIS — E038 Other specified hypothyroidism: Secondary | ICD-10-CM

## 2015-06-18 DIAGNOSIS — Z951 Presence of aortocoronary bypass graft: Secondary | ICD-10-CM

## 2015-06-18 DIAGNOSIS — J9611 Chronic respiratory failure with hypoxia: Secondary | ICD-10-CM | POA: Insufficient documentation

## 2015-06-18 MED ORDER — METOPROLOL TARTRATE 25 MG PO TABS: 25.0000 mg | ORAL_TABLET | Freq: Two times a day (BID) | ORAL | Status: DC

## 2015-06-18 MED ORDER — LOSARTAN POTASSIUM 25 MG PO TABS: 12.5000 mg | ORAL_TABLET | Freq: Every day | ORAL | Status: DC

## 2015-06-18 MED ORDER — FENOFIBRATE 48 MG PO TABS: 48.0000 mg | ORAL_TABLET | Freq: Every day | ORAL | Status: DC

## 2015-06-18 NOTE — Assessment & Plan Note (Addendum)
Well controlled. With some occasional dizziness, we will switch his losartan dose in the evening and reduce to 12.5 mg

## 2015-06-18 NOTE — Assessment & Plan Note (Signed)
No claudication despite reduced ABIs. Stable overall findings. Follow up annually. Since he is limited more by exertional dyspnea than claudication, I agree with the concept of expected management and not proceeding with femoral bypass. Recommended continued walking. Other cardiovascular risk modification as the

## 2015-06-18 NOTE — Assessment & Plan Note (Signed)
No anginal symptoms since his CABG. Despite having oxygen desaturation to 87% with ambulation, he is not having any of his angina. On beta blocker. Reducing dose of ARB for dizziness. On statin and Plavix. Stopped aspirin last visit  For bruising.

## 2015-06-18 NOTE — Assessment & Plan Note (Signed)
Relatively well-controlled. Not quite at goal. May need increased dose of fenofibrate versus atorvastatin. Labs are being followed by PCP.

## 2015-06-18 NOTE — Assessment & Plan Note (Signed)
Last stress test was in 2013. Would consider discussing followup stress test at next annual visit

## 2015-06-18 NOTE — Patient Instructions (Signed)
Decrease  Losartan to 12.5 mg  ( 1/2 tablet of 25 mg ) take at bedtime.  Let your primary Doctor Maisie Fushomas - O2 SAT BEFORE WALKING WAS 93 %, AFTER FEW MINUTES OF WALKING 87%, RECOVERY 95%  NO OTHER CHANGES  Your physician wants you to follow-up in 12 MONTHS WITH Dr Herbie BaltimoreHARDING. You will receive a reminder letter in the mail two months in advance. If you don't receive a letter, please call our office to schedule the follow-up appointment.  If you need a refill on your cardiac medications before your next appointment, please call your pharmacy.

## 2015-06-18 NOTE — Assessment & Plan Note (Signed)
His PCP is reduced his dose of Synthroid to 50 mcg. Somehow my name is on his prescription for this, I have asked that this patient changed back over to his PCP as I am not managing his hypothyroidism.

## 2015-06-18 NOTE — Assessment & Plan Note (Signed)
History of significant COPD. We have elected in today and his oxygen saturation decreased to less than 87%. He would qualify for home oxygen, but he states that he was currently referred to a pulmonologist for this. I don't think it requires a pulmonologist to do home oxygen, I think he would definitely benefit from a quality of life perspective. He is not psychologically ready to start home oxygen however.

## 2015-07-15 ENCOUNTER — Other Ambulatory Visit: Payer: Self-pay | Admitting: Cardiology

## 2015-08-26 ENCOUNTER — Other Ambulatory Visit: Payer: Self-pay | Admitting: Cardiology

## 2015-08-27 NOTE — Telephone Encounter (Signed)
Rx request sent to pharmacy.  

## 2015-09-03 ENCOUNTER — Other Ambulatory Visit: Payer: Self-pay | Admitting: Cardiology

## 2015-09-03 NOTE — Telephone Encounter (Signed)
Rx request sent to pharmacy.  

## 2015-11-24 DIAGNOSIS — E039 Hypothyroidism, unspecified: Secondary | ICD-10-CM | POA: Insufficient documentation

## 2015-11-24 DIAGNOSIS — I10 Essential (primary) hypertension: Secondary | ICD-10-CM | POA: Insufficient documentation

## 2015-11-25 DIAGNOSIS — H9190 Unspecified hearing loss, unspecified ear: Secondary | ICD-10-CM | POA: Insufficient documentation

## 2016-05-11 ENCOUNTER — Other Ambulatory Visit: Payer: Self-pay | Admitting: Cardiology

## 2016-05-11 DIAGNOSIS — I739 Peripheral vascular disease, unspecified: Secondary | ICD-10-CM

## 2016-05-11 DIAGNOSIS — E785 Hyperlipidemia, unspecified: Secondary | ICD-10-CM

## 2016-05-11 DIAGNOSIS — I2510 Atherosclerotic heart disease of native coronary artery without angina pectoris: Secondary | ICD-10-CM

## 2016-06-11 ENCOUNTER — Other Ambulatory Visit: Payer: Self-pay | Admitting: Cardiology

## 2016-06-11 NOTE — Telephone Encounter (Signed)
REFILL 

## 2016-06-15 ENCOUNTER — Inpatient Hospital Stay (HOSPITAL_COMMUNITY): Admission: RE | Admit: 2016-06-15 | Payer: Medicare HMO | Source: Ambulatory Visit

## 2016-06-15 ENCOUNTER — Other Ambulatory Visit: Payer: Self-pay | Admitting: Cardiology

## 2016-06-15 ENCOUNTER — Ambulatory Visit (HOSPITAL_COMMUNITY)
Admission: RE | Admit: 2016-06-15 | Discharge: 2016-06-15 | Disposition: A | Discharge status: Home / Self Care | Payer: Medicare HMO | Source: Ambulatory Visit | Attending: Cardiology | Admitting: Cardiology

## 2016-06-15 DIAGNOSIS — I739 Peripheral vascular disease, unspecified: Secondary | ICD-10-CM | POA: Insufficient documentation

## 2016-06-15 DIAGNOSIS — Z9889 Other specified postprocedural states: Secondary | ICD-10-CM | POA: Diagnosis not present

## 2016-06-15 DIAGNOSIS — E785 Hyperlipidemia, unspecified: Secondary | ICD-10-CM

## 2016-06-15 DIAGNOSIS — I2510 Atherosclerotic heart disease of native coronary artery without angina pectoris: Secondary | ICD-10-CM | POA: Diagnosis present

## 2016-06-15 NOTE — Telephone Encounter (Signed)
Rx has been sent to the pharmacy electronically. ° °

## 2016-06-17 ENCOUNTER — Ambulatory Visit (INDEPENDENT_AMBULATORY_CARE_PROVIDER_SITE_OTHER): Payer: Medicare HMO | Admitting: Cardiology

## 2016-06-17 ENCOUNTER — Encounter: Payer: Self-pay | Admitting: Cardiology

## 2016-06-17 VITALS — BP 114/78 | HR 63 | Ht 67.0 in | Wt 209.8 lb

## 2016-06-17 DIAGNOSIS — J9611 Chronic respiratory failure with hypoxia: Secondary | ICD-10-CM | POA: Diagnosis not present

## 2016-06-17 DIAGNOSIS — I739 Peripheral vascular disease, unspecified: Secondary | ICD-10-CM | POA: Diagnosis not present

## 2016-06-17 DIAGNOSIS — I10 Essential (primary) hypertension: Secondary | ICD-10-CM

## 2016-06-17 DIAGNOSIS — E785 Hyperlipidemia, unspecified: Secondary | ICD-10-CM

## 2016-06-17 DIAGNOSIS — I2510 Atherosclerotic heart disease of native coronary artery without angina pectoris: Secondary | ICD-10-CM | POA: Diagnosis not present

## 2016-06-17 MED ORDER — ATORVASTATIN CALCIUM 20 MG PO TABS: ORAL_TABLET | ORAL | 3 refills | Status: DC

## 2016-06-17 NOTE — Patient Instructions (Addendum)
MEDICATION CHANGES ALTERNATE 40 MG ( 2 TABLETS OF 20 MG ) ATORVASTATIN WITH 20 MG ATORVASTATIN EVERY OTHER DAY   TALK WITH PRIMARY DOCTOR ABOUT NEEDING OXYGEN THERAPY   SCHEDULE IN FEB 2018 [address] Your physician has requested that you have a lexiscan myoview. For further information please visit [email or web address] Please follow instruction sheet, as given.   Your physician wants you to follow-up in: 12 MONTHS WITH DR HARDING- 30 MIN. You will receive a reminder letter in the mail two months in advance. If you don't receive a letter, please call our office to schedule the follow-up appointment.  If you need a refill on your cardiac medications before your next appointment, please call your pharmacy.

## 2016-06-17 NOTE — Progress Notes (Signed)
PCP: Irena Reichmann, DO  Clinic Note: Chief Complaint  Patient presents with  . Coronary Artery Disease    annual f/u  . PAD  . COPD    HPI: Michael [name] is a 70 y.o. male with a PMH below who presents today for annual follow-up of CAD-CABG & PAD-L Iliac Stent & 100% CTO of R Iliac with significant lumbar collaterals.  He has baseline DOE from COPD that pre-dates his MI.  This limits his daily activity level.  Michael [name] was last seen on 06/18/2015. He indicated that his breathing gets worse every year, but has not yet ready for home O2. Was somewhat upset that he just couldn't go whenever he wanted to go walking. He states that his walking is limited by shortness of breath and not claudication -- no claudication since his right stent.. Note occasional dizzy headedness but no syncope or near syncope. No anginal pain. Indicated that he felt sleepy all the time  Recent Hospitalizations: None  Studies Reviewed:   Lower Extremity Arterial Dopplers 06/09/2016: Known bilateral inflow disease status post left common iliac stent. RABI 0.8, RTBI 0.71, LABI 1.2, LTBI 0.77 --> normal TBI's. Slight decrease in right ABI but remains in mild range --> follow-up 1 year  Abdominal iliac Dopplers: Aortoiliac atherosclerosis without stenosis. Chronic occluded right common iliac. Stable and normal right external iliac. Stable greater >50% left common iliac stenosis. Status post stent. Stable left external iliac. --> Annual follow-up  Interval History: [name] returns today stating that he really hasn't changed at all. He still gets short of breath with doing just a simple walking of coming back from the waiting room to the clinic room. He still refuses to go to pulmonologist, stating that his lungs are getting better so why should be paid for some meals. Unfortunately, his primary physician is not really treating his lungs. Aspirin in addition to exertional dyspnea, he is also limited by rate significant hip  pain that is different than his claudication. This happens off and on and is often related to if he has been doing some more walking than usual, or how he may have been sitting or sleeping. He really doesn't notice any claudication symptoms -- mostly because he doesn't walk enough. He says sometimes he gets short of breath simply taking a shower and will have to stop to catch his breath prior to washing his hair. Despite his dyspnea, he is not had any resting or exertional chest tightness or pressure. He has no PND or orthopnea. No edema.  No palpitations, lightheadedness, dizziness, weakness or syncope/near syncope. No TIA/amaurosis fugax symptoms. No melena, hematochezia, hematuria, or epstaxis. He probably does have some claudication if he was able to exert himself more, but not at the current level. His claudication symptoms that led to the iliac artery stenting are no longer present. claudication.   ROS: A comprehensive was performed. Review of Systems  Constitutional: Negative for malaise/fatigue (Exercise intolerance mostly because of dyspnea) and weight loss (He is actually gaining weight because he Can't really exercise.  He also refuses to eat healthy foods ").  HENT: Negative for nosebleeds.   Respiratory: Positive for cough (Mostly in the morning), sputum production (Yellow sputum), shortness of breath (At baseline. Very limiting) and wheezing.   Cardiovascular: Negative.        Per history of present illness  Gastrointestinal: Positive for constipation (Off-and-on) and heartburn (Depending on what he eats). Negative for abdominal pain, blood in stool and melena.  Genitourinary: Negative  for hematuria.  Musculoskeletal: Positive for joint pain (Lots of pain in his right hip, also ankle and hand pain). Negative for falls and myalgias.  Skin: Negative.   Neurological: Positive for dizziness (Sometimes if he gets up really fast.). Negative for headaches.  Psychiatric/Behavioral: Negative  for depression and memory loss. The patient is not nervous/anxious and does not have insomnia.      Past Medical History:  Diagnosis Date  . CAD, multiple vessel 05/2011   90% mid LAD with diffuse proximal stenosis, 100% proximal RCA --> s/p CABG; b) Lexiscan Myoview 09/2011: fixed inferior defect (? artifact), no ischemia, EF 69%, no WMA  . Carotid artery occlusion   . COPD (chronic obstructive pulmonary disease) with emphysema (HCC)   . Essential hypertension   . History of non-ST elevation myocardial infarction (NSTEMI) 06/05/2011   With CHF - probably late presentation for missed STEMI --> CATH- MV CAD;; Echo 07/2001: EF > 55%, mild MAC, mild TR, no WMA  with Post-op Setpal dyskinesis, Apex not seen)  . History of tobacco abuse    quit 05/2011  . Hyperlipidemia with target LDL less than 70   . Hypothyroidism   . PAD (peripheral artery disease) (HCC) 06/08/2011   a) R Common Iliac 100%, L Common Iliac 80+% occlusion; b) L CIA - Aterectomy- 10 mm x 40 mm self-extending Stent (postdilated to 7 mm with 20-30% residual)--> Deferred R AoFem bypass;; c) LEA Dopplers 04/2012 & 04/2014 - RABI 0.8 & LABI 1.1 - STABLE, no change  . 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 CARDIAC CATHETERIZATION  06/09/2011  . CORONARY ARTERY BYPASS GRAFT  06/10/2011   oLIMA to LAD, SVG to PDA (Dr. Tyrone Sage)  . ILIAC ARTERY STENT  10/27/11   left CIA - 78mmx40mm self-expanding stent, decreasing downt to 20-30% lesion, 100% occluded R CIA with extensive lumbar collateralization to R internal iliac  . NM MYOVIEW LTD  February 2013   LOW RISK: Fixed basal-mid inferior diaphragmatic attenuation artifact. No ischemia. EF 69%.  Marland Kitchen PERCUTANEOUS STENT INTERVENTION Left 10/27/2011   Procedure: PERCUTANEOUS STENT INTERVENTION;  Surgeon: Runell Gess, MD;  Location: [facility] CATH LAB;  Service: Cardiovascular;  Laterality: Left;  . TONSILLECTOMY  1974  .  TRANSTHORACIC ECHOCARDIOGRAM  08/19/2011   EF=>55%; mild mitral annular calcif; mild TR    Prior to Admission medications   Medication Sig Start Date End Date Taking? Authorizing Provider  atorvastatin (LIPITOR) 20 MG tablet TAKE 1 TABLET BY MOUTH DAILY 07/16/15  Yes Marykay Lex, MD  clopidogrel (PLAVIX) 75 MG tablet TAKE 1 TABLET BY MOUTH EVERY OTHER DAY 09/03/15  Yes Marykay Lex, MD  fenofibrate (TRICOR) 48 MG tablet TAKE 1 TABLET BY MOUTH DAILY 06/11/16  Yes Marykay Lex, MD  levothyroxine (SYNTHROID, LEVOTHROID) 75 MCG tablet Take 1 tablet by mouth daily. 06/01/16  Yes Historical Provider, MD  losartan (COZAAR) 25 MG tablet TAKE 1 TABLET BY MOUTH ONCE DAILY 09/03/15  Yes Marykay Lex, MD  metoprolol tartrate (LOPRESSOR) 25 MG tablet TAKE 1 TABLET BY MOUTH TWICE DAILY 06/15/16  Yes Marykay Lex, MD     Allergies  Allergen Reactions  . Ace Inhibitors Cough    Social History   Social History  . Marital status: Divorced    Spouse name: N/A  . Number of children: 3  . Years of education: N/A   Social History Main Topics  .  Smoking status: Former Smoker    Packs/day: 3.00    Years: 50.00    Types: Cigarettes    Quit date: 06/08/2011  . Smokeless tobacco: Former Neurosurgeon    Types: Chew    Quit date: 07/13/2003  . Alcohol use 0.0 oz/week     Comment: Pt reports drinking regularly and heavily, reports typically drinking 12-pack in a day, "but not every day"  . Drug use: No  . Sexual activity: No   Other Topics Concern  . None   Social History Narrative  . None    Family History  Problem Relation Age of Onset  . Heart disease Mother     valve replacement  . Diabetes Mother   . Heart failure Mother   . CAD Sister     cardiac stent  . Stroke Sister     stents in neck  . Pneumonia Father     Wt Readings from Last 3 Encounters:  06/17/16 95.2 kg (209 lb 12.8 oz)  06/18/15 91.1 kg (200 lb 14.4 oz)  05/28/14 93 kg (205 lb)  - unable to exercise (his wgt  @ home has been stable); does not eat much.  PHYSICAL EXAM BP 114/78   Pulse 63   Ht 5\' 7"  (1.702 m)   Wt 95.2 kg (209 lb 12.8 oz)   BMI 32.86 kg/m   Gen: Pleasant mood and affect. Overall healthy-appearing. No acute distress. Neck: no adenopathy, no JVD, supple, Soft right-sided carotid bruit  Lungs: Nonlabored but baseline accessory muscle use. Increased AP diameter. Mild interstitial sounds, but no true wheezes rales or rhonchi. Just diminished breath sounds throughout.  Heart: regular rate and rhythm, S1& S2 normal, no murmur, click, rub or gallop and normal apical impulse  Abdomen: soft, non-tender; bowel sounds normal; no masses, no organomegaly and Mild truncal obesity  Extremities: extremities normal, atraumatic, no cyanosis or edema, no edema, redness or tenderness in the calves or thighs and no ulcers, gangrene or trophic changes  Pulses: Essentially normal 2+ in the left leg, but 1+ on the right DP and PT  Neurologic: Grossly normal     Adult ECG Report  Rate: 63 ;  Rhythm: normal sinus rhythm and Borderline 1 AVB (PR interval 200). ST and T-wave changes, consider ischemia. No PVCs. Otherwise normal axis, intervals and durations.;   Narrative Interpretation: Stable EKG reviewed no PVCs   Other studies Reviewed: Additional studies/ records that were reviewed today include:  Recent Labs:  -- followed by PCP - last check 2 weeks ago - see scanned..  From last month: TC 153, TG 160, HDL 35, LDL 95.  ASSESSMENT / PLAN: Problem List Items Addressed This Visit    PAD (peripheral artery disease) - Right Common Iliac Occlusion, Left Common Iliac 80+% occlusion (Chronic)    Slight progression of disease in the right SFA, but relatively stable with no significant claudication symptoms.  Follow annually. Adenopathy is very limited by claudication as he is so dyspneic. Continue walking. Risk factor modification for cardiovascular risk - Plavix, statin, beta blocker and  ARB.      Relevant Medications   atorvastatin (LIPITOR) 20 MG tablet   Other Relevant Orders   EKG 12-Lead   Myocardial Perfusion Imaging   Hyperlipidemia with target LDL less than 70 (Chronic)    LDL still not quite at goal. I would like to keep get the LDL down slowly but more. Will recommend alternating every other day 20 and then 40 mg of atorvastatin. Recheck  in 6 months.      Relevant Medications   atorvastatin (LIPITOR) 20 MG tablet   Other Relevant Orders   EKG 12-Lead   Myocardial Perfusion Imaging   Essential hypertension (Chronic)    Blood pressure well controlled. Less dizzy with lower doses of medications. He is splitting up when he takes his meds.      Relevant Medications   atorvastatin (LIPITOR) 20 MG tablet   Other Relevant Orders   EKG 12-Lead   Myocardial Perfusion Imaging   Chronic respiratory failure with hypoxia (HCC) - SaO2 drops to 87% with ambulation (Chronic)    We have documented his saturations dropped to 87%. He never went on oxygen however. I recommended that he talk frankly with his PCP and recheck oxygen levels with exertion. If he does desaturate, he probably should be on oxygen. He probably should be on something like Spiriva once or minus an inhaled steroid. He maybe even when necessary albuterol.  Since he refuses going to the pulmonologist, I will defer this to his PCP.  -- Recommend home oxygen and COPD management by PCP      CAD, multiple vessel - status post CABG: LIMA-LAD, SVG-RPDA - Primary (Chronic)    No further angina or heart failure symptoms since CABG. She'll has oxygen desaturation, but has not been using oxygen at home. Is on Plavix without aspirin. Atorvastatin plus fenofibrate. On low-dose ARB and beta blocker. Bruising much improved after stopping aspirin.  Plan: Continue current medications.  He is due for a Myoview in roughly February timeframe. This would be most 5 years out from his last Myoview to follow-up CABG.  Basically, with his lack of mobility, we need to test for any progression of disease or graft disease.      Relevant Medications   atorvastatin (LIPITOR) 20 MG tablet   Other Relevant Orders   EKG 12-Lead   Myocardial Perfusion Imaging    Other Visit Diagnoses   None.     Current medicines are reviewed at length with the patient today. (+/- concerns) COPD questions The following changes have been made:    Patient Instructions  MEDICATION CHANGES ALTERNATE 40 MG ( 2 TABLETS OF 20 MG ) ATORVASTATIN WITH 20 MG ATORVASTATIN EVERY OTHER DAY   TALK WITH PRIMARY DOCTOR ABOUT NEEDING OXYGEN THERAPY   SCHEDULE IN FEB 2018 [address] Your physician has requested that you have a lexiscan myoview. For further information please visit [email or web address] Please follow instruction sheet, as given.   Your physician wants you to follow-up in: 12 MONTHS WITH DR Airyanna Dipalma- 30 MIN. You will receive a reminder letter in the mail two months in advance. If you don't receive a letter, please call our office to schedule the follow-up appointment.  If you need a refill on your cardiac medications before your next appointment, please call your pharmacy.   Studies Ordered:   Orders Placed This Encounter  Procedures  . Myocardial Perfusion Imaging  . EKG 12-Lead      Bryan Lemmaavid Tyliyah Mcmeekin, M.D., M.S. Interventional Cardiologist   Pager # [phone] Phone # [phone] [address] [place], [place] [address]

## 2016-06-18 ENCOUNTER — Encounter: Payer: Self-pay | Admitting: Cardiology

## 2016-06-18 NOTE — Assessment & Plan Note (Signed)
Blood pressure well controlled. Less dizzy with lower doses of medications. He is splitting up when he takes his meds.

## 2016-06-18 NOTE — Assessment & Plan Note (Signed)
We have documented his saturations dropped to 87%. He never went on oxygen however. I recommended that he talk frankly with his PCP and recheck oxygen levels with exertion. If he does desaturate, he probably should be on oxygen. He probably should be on something like Spiriva once or minus an inhaled steroid. He maybe even when necessary albuterol.  Since he refuses going to the pulmonologist, I will defer this to his PCP.  -- Recommend home oxygen and COPD management by PCP

## 2016-06-18 NOTE — Assessment & Plan Note (Signed)
Slight progression of disease in the right SFA, but relatively stable with no significant claudication symptoms.  Follow annually. Adenopathy is very limited by claudication as he is so dyspneic. Continue walking. Risk factor modification for cardiovascular risk - Plavix, statin, beta blocker and ARB.

## 2016-06-18 NOTE — Assessment & Plan Note (Signed)
LDL still not quite at goal. I would like to keep get the LDL down slowly but more. Will recommend alternating every other day 20 and then 40 mg of atorvastatin. Recheck in 6 months.

## 2016-06-18 NOTE — Assessment & Plan Note (Signed)
No further angina or heart failure symptoms since CABG. She'll has oxygen desaturation, but has not been using oxygen at home. Is on Plavix without aspirin. Atorvastatin plus fenofibrate. On low-dose ARB and beta blocker. Bruising much improved after stopping aspirin.  Plan: Continue current medications.  He is due for a Myoview in roughly February timeframe. This would be most 5 years out from his last Myoview to follow-up CABG. Basically, with his lack of mobility, we need to test for any progression of disease or graft disease.

## 2016-07-14 ENCOUNTER — Other Ambulatory Visit: Payer: Self-pay | Admitting: Cardiology

## 2016-07-14 NOTE — Telephone Encounter (Signed)
Rx has been sent to the pharmacy electronically. ° °

## 2016-09-09 ENCOUNTER — Other Ambulatory Visit: Payer: Self-pay | Admitting: Cardiology

## 2016-09-24 HISTORY — PX: NM MYOVIEW LTD: HXRAD82

## 2016-10-01 ENCOUNTER — Telehealth (HOSPITAL_COMMUNITY): Payer: Self-pay

## 2016-10-01 NOTE — Telephone Encounter (Signed)
Encounter complete. 

## 2016-10-02 ENCOUNTER — Telehealth (HOSPITAL_COMMUNITY): Payer: Self-pay

## 2016-10-02 NOTE — Telephone Encounter (Signed)
Encounter complete. 

## 2016-10-06 ENCOUNTER — Ambulatory Visit (HOSPITAL_COMMUNITY)
Admission: RE | Admit: 2016-10-06 | Discharge: 2016-10-06 | Disposition: A | Discharge status: Home / Self Care | Payer: Medicare HMO | Source: Ambulatory Visit | Attending: Cardiovascular Disease | Admitting: Cardiovascular Disease

## 2016-10-06 DIAGNOSIS — I2510 Atherosclerotic heart disease of native coronary artery without angina pectoris: Secondary | ICD-10-CM | POA: Diagnosis not present

## 2016-10-06 DIAGNOSIS — I10 Essential (primary) hypertension: Secondary | ICD-10-CM | POA: Diagnosis not present

## 2016-10-06 DIAGNOSIS — E785 Hyperlipidemia, unspecified: Secondary | ICD-10-CM | POA: Insufficient documentation

## 2016-10-06 DIAGNOSIS — R9439 Abnormal result of other cardiovascular function study: Secondary | ICD-10-CM | POA: Diagnosis not present

## 2016-10-06 DIAGNOSIS — I739 Peripheral vascular disease, unspecified: Secondary | ICD-10-CM | POA: Diagnosis not present

## 2016-10-06 LAB — MYOCARDIAL PERFUSION IMAGING
CHL CUP NUCLEAR SDS: 6
CHL CUP RESTING HR STRESS: 58 {beats}/min
CSEPPHR: 87 {beats}/min
LV dias vol: 75 mL (ref 62–150)
LVSYSVOL: 29 mL
SRS: 1
SSS: 7
TID: 0.73

## 2016-10-06 MED ORDER — TECHNETIUM TC 99M TETROFOSMIN IV KIT
29.6000 | PACK | Freq: Once | INTRAVENOUS | Status: AC | PRN
  Administered 2016-10-06: 29.6 via INTRAVENOUS
  Filled 2016-10-06: qty 30

## 2016-10-06 MED ORDER — TECHNETIUM TC 99M TETROFOSMIN IV KIT
10.7000 | PACK | Freq: Once | INTRAVENOUS | Status: AC | PRN
  Administered 2016-10-06: 10.7 via INTRAVENOUS
  Filled 2016-10-06: qty 11

## 2016-10-06 MED ORDER — AMINOPHYLLINE 25 MG/ML IV SOLN
75.0000 mg | Freq: Once | INTRAVENOUS | Status: AC
  Administered 2016-10-06: 75 mg via INTRAVENOUS

## 2016-10-06 MED ORDER — REGADENOSON 0.4 MG/5ML IV SOLN
0.4000 mg | Freq: Once | INTRAVENOUS | Status: AC
  Administered 2016-10-06: 0.4 mg via INTRAVENOUS

## 2016-10-08 ENCOUNTER — Telehealth: Payer: Self-pay | Admitting: *Deleted

## 2016-10-08 NOTE — Telephone Encounter (Signed)
Left message to call back - result of [facility]iew [facility]

## 2016-10-08 NOTE — Telephone Encounter (Signed)
-----   Message from Marykay Lexavid W Harding, MD sent at 10/06/2016  6:30 PM EST ----- Stress test was read as intermediate risk. There was a suggestion of prior infarct with peri-infarct ischemia. Unfortunately I can't see the pictures, but he did have a similar type of finding on last cath. He should be coming in for follow-up visit soon to discuss the result. If he is not having active chest tightness/pressure or worsening dyspnea symptoms, we can just simply see him for routine follow-up (preferably within the next month).  Bryan Lemmaavid Harding, MD

## 2016-10-14 NOTE — Telephone Encounter (Signed)
left message to call back- answer machine

## 2016-10-23 NOTE — Telephone Encounter (Signed)
Spoke with pt, nuclear stress test discussed in detail with the patient. Follow up appointment given. He has not had any chest pain, his SOB is his usual and he is going to talk to his medical doctor about getting oxygen. All questions answered.

## 2016-10-23 NOTE — Telephone Encounter (Signed)
Follow Up   Pt states that he got bill for Echo and won't pay until he gets the results because he states no one will get into contact with him

## 2016-11-12 ENCOUNTER — Ambulatory Visit (INDEPENDENT_AMBULATORY_CARE_PROVIDER_SITE_OTHER): Payer: Medicare HMO | Admitting: Cardiology

## 2016-11-12 ENCOUNTER — Other Ambulatory Visit: Payer: Self-pay | Admitting: Cardiology

## 2016-11-12 ENCOUNTER — Encounter: Payer: Self-pay | Admitting: Cardiology

## 2016-11-12 VITALS — BP 134/73 | HR 69 | Ht 67.0 in | Wt 213.2 lb

## 2016-11-12 DIAGNOSIS — J9611 Chronic respiratory failure with hypoxia: Secondary | ICD-10-CM

## 2016-11-12 DIAGNOSIS — Z951 Presence of aortocoronary bypass graft: Secondary | ICD-10-CM

## 2016-11-12 DIAGNOSIS — I2510 Atherosclerotic heart disease of native coronary artery without angina pectoris: Secondary | ICD-10-CM | POA: Diagnosis not present

## 2016-11-12 DIAGNOSIS — R9439 Abnormal result of other cardiovascular function study: Secondary | ICD-10-CM | POA: Insufficient documentation

## 2016-11-12 DIAGNOSIS — E785 Hyperlipidemia, unspecified: Secondary | ICD-10-CM | POA: Diagnosis not present

## 2016-11-12 DIAGNOSIS — I739 Peripheral vascular disease, unspecified: Secondary | ICD-10-CM

## 2016-11-12 DIAGNOSIS — I10 Essential (primary) hypertension: Secondary | ICD-10-CM | POA: Diagnosis not present

## 2016-11-12 NOTE — Patient Instructions (Addendum)
Medication Instructions: Your physician recommends that you continue on your current medications as directed. Please refer to the Current Medication list given to you today.  Testing/Procedures: Heart Catheterization first week of April with Dr. Lyndel PleasureHardaing--Dx: Abnormal Nuclear Stress Test    [facility] [address] [place] [address] Dept: [phone] Loc: [phone]  Michael ErmJames [name]  11/12/2016  You are scheduled for a Cardiac Catheterization on ,   with Dr. Bryan Lemmaavid Harding.  1. [facility]: [address] [place], [place] [address] at       (two hours before your procedure to ensure your preparation). Free valet parking service is available.   Special note: Every effort is made to have your procedure done on time. Please understand that emergencies sometimes delay scheduled procedures.  2. Diet: Do not eat or drink anything after midnight prior to your procedure except sips of water to take medications.  3. Labs: Within 14 days of your procedure.  4. Medication instructions in preparation for your procedure:   On the morning of your procedure, take your Plavix/Clopidogrel and any morning medicines NOT listed above.  You may use sips of water.  5. Plan for one night stay--bring personal belongings. 6. Bring a current list of your medications and current insurance cards. 7. You MUST have a responsible person to drive you home. 8. Someone MUST be with you the first 24 hours after you arrive home or your discharge will be delayed. 9. Please wear clothes that are easy to get on and off and wear slip-on shoes.  Thank you for allowing us to care for you!   -- [facility]

## 2016-11-12 NOTE — Progress Notes (Signed)
PCP: [name], DO  Clinic Note: Chief Complaint  Patient presents with  . Follow-up    CAD, PAD, DOE(COPD)  . Shortness of Breath    frequently  . Dizziness    when sob  . Leg Pain    cramping in legs.     HPI: Michael [name] is a 71 y.o. male with a PMH below who presents today for Basically six-month follow-up for his significant CAD and PAD with risk factors. He has a history of CAD-CABG &PAD-L Iliac Stent &100% CTO of R Iliac with significant lumbar collaterals. He has baseline DOE from COPD that pre-dates his MI. This limits his daily activity level.  Calven Gilkes was last seen on 06/17/2016 -he noted that time, that his breathing is getting continuously and progressively worse. He is extremely limited by his exertional dyspnea more so than any claudication. He does note any claudication in the left side. He had a screening Myoview stress test done for his evaluation.  Recent Hospitalizations: N/A  Studies Reviewed:   Myoview 10/06/2016:: EF 62%. Medium sized, moderate severity defect in the basal inferior, mid inferoseptal and mid inferior walls is partially reversible.  Ao-Ileac & LEA Dopplers: RABI 0.8, LABI 1.2. B TBI 0.77 -- he has known bilateral disease status post left iliac stent. Right ABI has decreased and left ABI is normal; aortic iliacs showed aortoiliac atherosclerosis with chronically occluded right common iliac artery stable and normal right external iliac artery. Stable> percent left common iliac stenosis  Interval History: [name] presents today really noticing that his exertional dyspnea is even worse than it has been before. He has not even really don't do exercise enough to notice claudication. He is thinking about talking to his primary physician about starting home oxygen. He is to the point where it that is concerning enough for him. He does not have any significant PND or orthopnea, but does have some mild edema on the left greater than the right.  He says he is short of breath just even at rest, but with any exertion is worse.  No palpitations, lightheadedness,  weakness or syncope/near syncope. Mild orthostatic dizziness No TIA/amaurosis fugax symptoms. No melena, hematochezia, hematuria, or epstaxis. Is not walking enough to determine if he is having claudication.  ROS: A comprehensive was performed. Review of Systems  Constitutional: Negative for malaise/fatigue (Just doesn't have energy to do things because of his dyspnea).  HENT: Negative for congestion.   Respiratory: Positive for cough (In the morning), shortness of breath and wheezing.   Cardiovascular: Positive for leg swelling (L>R).  Gastrointestinal: Negative for abdominal pain.  Musculoskeletal: Positive for joint pain (Significant right hip pain).  Psychiatric/Behavioral: Positive for memory loss (Has noted memory issues since his surgery.).  All other systems reviewed and are negative.   Past Medical History:  Diagnosis Date  . CAD, multiple vessel 05/2011   90% mid LAD with diffuse proximal stenosis, 100% proximal RCA --> s/p CABG; b) Lexiscan Myoview 09/2011: fixed inferior defect (? artifact), no ischemia, EF 69%, no WMA  . Carotid artery occlusion   . COPD (chronic obstructive pulmonary disease) with emphysema ([place])   . Essential hypertension   . History of non-ST elevation myocardial infarction (NSTEMI) 06/05/2011   With CHF - probably late presentation for missed STEMI --> CATH- MV CAD;; Echo 07/2001: EF > 55%, mild MAC, mild TR, no WMA  with Post-op Setpal dyskinesis, Apex not seen)  . History of tobacco abuse    quit 05/2011  .  Hyperlipidemia with target LDL less than 70   . Hypothyroidism   . PAD (peripheral artery disease) ([place]) 06/08/2011   a) R Common Iliac 100%, L Common Iliac 80+% occlusion; b) L CIA - Aterectomy- 10 mm x 40 mm self-extending Stent (postdilated to 7 mm with 20-30% residual)--> Deferred R AoFem bypass;; c) LEA Dopplers 04/2012 & 04/2014  - RABI 0.8 & LABI 1.1 - STABLE, no change  . 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 CARDIAC CATHETERIZATION  06/09/2011  . CORONARY ARTERY BYPASS GRAFT  06/10/2011   oLIMA to LAD, SVG to PDA (Dr. Servando Snare)  . ILIAC ARTERY STENT  10/27/2011   left CIA - 36mx40mm self-expanding stent, decreasing downt to 20-30% lesion, 100% occluded R CIA with extensive lumbar collateralization to R internal iliac;  05/2016: Ao-Ileac & LEA Dopplers: RABI 0.8, LABI 1.2. B TBI 0.77 -- he has known B disease s/p L CIA stent. RLABI decreased and LABI is normal; aortoiliac atherosclerosis w/ CTO of R CIA but normal REIA.  .Marland KitchenNM MYOVIEW LTD  February 2013   LOW RISK: Fixed basal-mid inferior diaphragmatic attenuation artifact. No ischemia. EF 69%.  .Marland KitchenPERCUTANEOUS STENT INTERVENTION Left 10/27/2011   Procedure: PERCUTANEOUS STENT INTERVENTION;  Surgeon: JLorretta Harp MD;  [facility];  Service: Cardiovascular;  Laterality: Left;  . TONSILLECTOMY  1974  . TRANSTHORACIC ECHOCARDIOGRAM  08/19/2011   EF=>55%; mild mitral annular calcif; mild TR    Current Meds  Medication Sig  . atorvastatin (LIPITOR) 20 MG tablet ALTERNATE 40 MG (2 TABLETS) EVERY OTHER DAY WITH 20 MG ( 1 TABLET) AT BEDTIME.  . fenofibrate (TRICOR) 48 MG tablet TAKE 1 TABLET BY MOUTH DAILY  . levothyroxine (SYNTHROID, LEVOTHROID) 88 MCG tablet   . losartan (COZAAR) 25 MG tablet TAKE 1 TABLET BY MOUTH ONCE DAILY  . metoprolol tartrate (LOPRESSOR) 25 MG tablet TAKE 1 TABLET BY MOUTH TWICE DAILY  . [DISCONTINUED] clopidogrel (PLAVIX) 75 MG tablet TAKE 1 TABLET BY MOUTH EVERY OTHER DAY  . [DISCONTINUED] levothyroxine (SYNTHROID, LEVOTHROID) 75 MCG tablet Take 1 tablet by mouth daily.    Allergies  Allergen Reactions  . Ace Inhibitors Cough    Social History   Social History  . Marital status: Divorced    Spouse name: N/A  . Number of children: 3  . Years of education:  N/A   Social History Main Topics  . Smoking status: Former Smoker    Packs/day: 3.00    Years: 50.00    Types: Cigarettes    Quit date: 06/08/2011  . Smokeless tobacco: Former [employer]   Types: Chew    Quit date: 07/13/2003  . Alcohol use 0.0 oz/week     Comment: Pt reports drinking regularly and heavily, reports typically drinking 12-pack in a day, "but not every day"  . Drug use: No  . Sexual activity: No   Other Topics Concern  . None   Social History Narrative  . None    family history includes CAD in his sister; Diabetes in his mother; Heart disease in his mother; Heart failure in his mother; Pneumonia in his father; Stroke in his sister.  Wt Readings from Last 3 Encounters:  11/12/16 96.7 kg (213 lb 3.2 oz)  10/06/16 94.8 kg (209 lb)  06/17/16 95.2 kg (209 lb 12.8 oz)    PHYSICAL EXAM BP 134/73   Pulse 69   Ht _0  (1.702  m)   Wt 96.7 kg (213 lb 3.2 oz)   BMI 33.39 kg/m  Gen: Pleasant mood and affect. Overall healthy-appearing. No acute distress. Neck: no adenopathy, no JVD, supple, Soft right-sided carotid bruit  Lungs: Nonlabored but baseline accessory muscle use. Increased AP diameter. Mild interstitial sounds, but no true wheezes rales or rhonchi. Just diminished breath sounds throughout.  Heart: regular rate and rhythm, S1& S2 normal, no murmur, click, rub or gallop and normal apical impulse  Abdomen: soft, non-tender; bowel sounds normal; no masses, no organomegaly and Mild truncal obesity  Extremities: extremities normal, atraumatic, no cyanosis or edema, no edema, redness or tenderness in the calves or thighs and no ulcers, gangrene or trophic changes  Pulses: Essentially normal 2+ in the left leg, but 1+ on the right DP and PT  Neurologic: Grossly normal     Adult ECG Report N/A  Other studies Reviewed: Additional studies/ records that were reviewed today include:  Recent Labs:   Lab Results  Component Value Date   CHOL 154 05/16/2013   HDL  32 (L) 05/16/2013   LDLCALC 61 05/16/2013   TRIG 305 (H) 05/16/2013   CHOLHDL 4.8 05/16/2013    ASSESSMENT / PLAN: Problem List Items Addressed This Visit    Abnormal nuclear stress test - Primary (Chronic)    Bit concerned about his Myoview. He was read as intermediate risk. I'm not surprised that he has an inferior infarct, but I'm a little bit worried about the lateral and anterior reversibility. He has not had any anginal symptoms, but really never did in the past it was mostly dyspnea and heart failure. With his progressively worsening dyspnea and the desire to finally wear home oxygen, I think we need to exclude any progression of his coronary disease. I reviewed his Myoview 2013, and I do see some notable differences with reversibility.  Plan: After a very long time talking about the findings and his symptoms, he agreed that was reasonable to proceed with left heart catheterization with coronary graft angiography and possible PCI.  The procedure with Risks/Benefits/Alternatives and Indications was reviewed with the patient.  All questions were answered.    Risks / Complications include, but not limited to: Death, MI, CVA/TIA, VF/VT (with defibrillation), Bradycardia (need for temporary pacer placement), contrast induced nephropathy, bleeding / bruising / hematoma / pseudoaneurysm, vascular or coronary injury (with possible emergent CT or Vascular Surgery), adverse medication reactions, infection.  Additional risks involving the use of radiation with the possibility of radiation burns and cancer were explained in detail.  The patient voices understanding and agrees to proceed.       Relevant Orders   DG Chest 2 View (Completed)   Lipid panel   Hepatic function panel   TSH   Protime-INR   APTT   CBC with Differential/Platelet   Basic Metabolic Panel (BMET)   CAD, multiple vessel - status post CABG: LIMA-LAD, SVG-RPDA (Chronic)    He never really did have angina. Most of his  symptoms were related to heart failure and dyspnea. He is stable regimen with Plavix alone, low-dose ARB and beta blocker along with the atorvastatin and fenofibrate.  - Concerning changes on his Myoview with plans for proceeding with cardiac catheterization. Now 5-1/2 years out from CABG. On stable regimen for now.      Relevant Orders   DG Chest 2 View (Completed)   Lipid panel   Hepatic function panel   TSH   Protime-INR   APTT   CBC  with Differential/Platelet   Basic Metabolic Panel (BMET)   Chronic respiratory failure with hypoxia (HCC) - SaO2 drops to 87% with ambulation (Chronic)    Annuloplasty, he finally agreed to talk about using oxygen at home with his PCP. We are excluding any worsening/progression of coronary disease as a potential culprit for his worsening dyspnea as well.      Relevant Orders   DG Chest 2 View (Completed)   Lipid panel   Hepatic function panel   TSH   Protime-INR   APTT   CBC with Differential/Platelet   Basic Metabolic Panel (BMET)   Essential hypertension (Chronic)    Well-controlled blood pressure now. He was dizzy when we had him on higher medicines. Therefore we reduced medications and split up the timing of his medications. Dizziness is improved. Pressure stable.      Hyperlipidemia with target LDL less than 70 (Chronic)    I have not seen that much of a recent labs. With his precath labs will good recheck a lipid panel for new baseline on the Epic computer system. He continues to be on atorvastatin alternating every other day along with fenofibrate.      Relevant Orders   DG Chest 2 View (Completed)   Lipid panel   Hepatic function panel   TSH   Protime-INR   APTT   CBC with Differential/Platelet   Basic Metabolic Panel (BMET)   PAD (peripheral artery disease) - Right Common Iliac Occlusion, Left Common Iliac 80+% occlusion (Chronic)    His Dopplers look stable from October. Plans to be to continue following up annually. He is on  Plavix, statin and along with beta blocker and ARB. I recommend that he continues walking which he will probably need to be on oxygen to do the future.      S/P CABG x 2 (Chronic)   Relevant Orders   DG Chest 2 View (Completed)   Lipid panel   Hepatic function panel   TSH   Protime-INR   APTT   CBC with Differential/Platelet   Basic Metabolic Panel (BMET)      Very long patient visit. I spent at least 8mnutes with the patient trying to go to his symptoms and the findings on his stress test as well the plan going forward. He is very reluctant to acknowledge that his symptoms are worsening and therefore needs reevaluated. He does understand that his bypass surgery was not a permanent fix. I discussed along time to explain things to him.  > 50% of the time was spent in direct counseling.  Current medicines are reviewed at length with the patient today. (+/- concerns) N/A The following changes have been made: ==?  Patient Instructions  Medication Instructions: Your physician recommends that you continue on your current medications as directed. Please refer to the Current Medication list given to you today.  Testing/Procedures: Heart Catheterization first week of April with Dr. HSheliah Hatch Abnormal Nuclear Stress Test  Studies Ordered:   Orders Placed This Encounter  Procedures  . DG Chest 2 View  . Lipid panel  . Hepatic function panel  . TSH  . Protime-INR  . APTT  . CBC with Differential/Platelet  . Basic Metabolic Panel (BMET)   f/u post-cath    DGlenetta Hew M.D., M.S. Interventional Cardiologist   Pager # [phone] # [phone] [address] [place] [address]

## 2016-11-13 ENCOUNTER — Ambulatory Visit
Admission: RE | Admit: 2016-11-13 | Discharge: 2016-11-13 | Disposition: A | Discharge status: Home / Self Care | Payer: Medicare HMO | Source: Ambulatory Visit | Attending: Cardiology | Admitting: Cardiology

## 2016-11-13 DIAGNOSIS — E785 Hyperlipidemia, unspecified: Secondary | ICD-10-CM

## 2016-11-13 DIAGNOSIS — J9611 Chronic respiratory failure with hypoxia: Secondary | ICD-10-CM

## 2016-11-13 DIAGNOSIS — R9439 Abnormal result of other cardiovascular function study: Secondary | ICD-10-CM

## 2016-11-13 DIAGNOSIS — Z951 Presence of aortocoronary bypass graft: Secondary | ICD-10-CM

## 2016-11-13 DIAGNOSIS — I2510 Atherosclerotic heart disease of native coronary artery without angina pectoris: Secondary | ICD-10-CM

## 2016-11-13 NOTE — Telephone Encounter (Signed)
REFILL 

## 2016-11-14 ENCOUNTER — Encounter: Payer: Self-pay | Admitting: Cardiology

## 2016-11-14 NOTE — Assessment & Plan Note (Signed)
Well-controlled blood pressure now. He was dizzy when we had him on higher medicines. Therefore we reduced medications and split up the timing of his medications. Dizziness is improved. Pressure stable.

## 2016-11-14 NOTE — Assessment & Plan Note (Signed)
Bit concerned about his Myoview. He was read as intermediate risk. I'm not surprised that he has an inferior infarct, but I'm a little bit worried about the lateral and anterior reversibility. He has not had any anginal symptoms, but really never did in the past it was mostly dyspnea and heart failure. With his progressively worsening dyspnea and the desire to finally wear home oxygen, I think we need to exclude any progression of his coronary disease. I reviewed his Myoview 2013, and I do see some notable differences with reversibility.  Plan: After a very long time talking about the findings and his symptoms, he agreed that was reasonable to proceed with left heart catheterization with coronary graft angiography and possible PCI.  The procedure with Risks/Benefits/Alternatives and Indications was reviewed with the patient.  All questions were answered.    Risks / Complications include, but not limited to: Death, MI, CVA/TIA, VF/VT (with defibrillation), Bradycardia (need for temporary pacer placement), contrast induced nephropathy, bleeding / bruising / hematoma / pseudoaneurysm, vascular or coronary injury (with possible emergent CT or Vascular Surgery), adverse medication reactions, infection.  Additional risks involving the use of radiation with the possibility of radiation burns and cancer were explained in detail.  The patient voices understanding and agrees to proceed.

## 2016-11-14 NOTE — Assessment & Plan Note (Signed)
He never really did have angina. Most of his symptoms were related to heart failure and dyspnea. He is stable regimen with Plavix alone, low-dose ARB and beta blocker along with the atorvastatin and fenofibrate.  - Concerning changes on his Myoview with plans for proceeding with cardiac catheterization. Now 5-1/2 years out from CABG. On stable regimen for now.

## 2016-11-14 NOTE — Assessment & Plan Note (Signed)
Annuloplasty, he finally agreed to talk about using oxygen at home with his PCP. We are excluding any worsening/progression of coronary disease as a potential culprit for his worsening dyspnea as well.

## 2016-11-14 NOTE — Assessment & Plan Note (Signed)
His Dopplers look stable from October. Plans to be to continue following up annually. He is on Plavix, statin and along with beta blocker and ARB. I recommend that he continues walking which he will probably need to be on oxygen to do the future.

## 2016-11-14 NOTE — Assessment & Plan Note (Signed)
I have not seen that much of a recent labs. With his precath labs will good recheck a lipid panel for new baseline on the Epic computer system. He continues to be on atorvastatin alternating every other day along with fenofibrate.

## 2016-11-24 ENCOUNTER — Ambulatory Visit (HOSPITAL_COMMUNITY)
Admission: RE | Admit: 2016-11-24 | Discharge: 2016-11-25 | Disposition: A | Discharge status: Home / Self Care | Payer: Medicare HMO | Source: Ambulatory Visit | Attending: Cardiology | Admitting: Cardiology

## 2016-11-24 ENCOUNTER — Encounter (HOSPITAL_COMMUNITY)
Admission: RE | Disposition: A | Discharge status: Home / Self Care | Payer: Self-pay | Source: Ambulatory Visit | Attending: Cardiology

## 2016-11-24 ENCOUNTER — Encounter (HOSPITAL_COMMUNITY): Payer: Self-pay | Admitting: Cardiology

## 2016-11-24 DIAGNOSIS — Z8249 Family history of ischemic heart disease and other diseases of the circulatory system: Secondary | ICD-10-CM | POA: Insufficient documentation

## 2016-11-24 DIAGNOSIS — I2582 Chronic total occlusion of coronary artery: Secondary | ICD-10-CM | POA: Diagnosis not present

## 2016-11-24 DIAGNOSIS — Z95820 Peripheral vascular angioplasty status with implants and grafts: Secondary | ICD-10-CM | POA: Diagnosis not present

## 2016-11-24 DIAGNOSIS — J449 Chronic obstructive pulmonary disease, unspecified: Secondary | ICD-10-CM | POA: Diagnosis not present

## 2016-11-24 DIAGNOSIS — I2510 Atherosclerotic heart disease of native coronary artery without angina pectoris: Secondary | ICD-10-CM | POA: Diagnosis not present

## 2016-11-24 DIAGNOSIS — E785 Hyperlipidemia, unspecified: Secondary | ICD-10-CM | POA: Diagnosis not present

## 2016-11-24 DIAGNOSIS — J9611 Chronic respiratory failure with hypoxia: Secondary | ICD-10-CM

## 2016-11-24 DIAGNOSIS — R9439 Abnormal result of other cardiovascular function study: Secondary | ICD-10-CM | POA: Diagnosis present

## 2016-11-24 DIAGNOSIS — I739 Peripheral vascular disease, unspecified: Secondary | ICD-10-CM | POA: Diagnosis not present

## 2016-11-24 DIAGNOSIS — I110 Hypertensive heart disease with heart failure: Secondary | ICD-10-CM | POA: Diagnosis not present

## 2016-11-24 DIAGNOSIS — Z87891 Personal history of nicotine dependence: Secondary | ICD-10-CM | POA: Diagnosis not present

## 2016-11-24 DIAGNOSIS — Z951 Presence of aortocoronary bypass graft: Secondary | ICD-10-CM

## 2016-11-24 DIAGNOSIS — Z833 Family history of diabetes mellitus: Secondary | ICD-10-CM | POA: Diagnosis not present

## 2016-11-24 DIAGNOSIS — I252 Old myocardial infarction: Secondary | ICD-10-CM | POA: Diagnosis not present

## 2016-11-24 DIAGNOSIS — Z7902 Long term (current) use of antithrombotics/antiplatelets: Secondary | ICD-10-CM | POA: Insufficient documentation

## 2016-11-24 DIAGNOSIS — I2584 Coronary atherosclerosis due to calcified coronary lesion: Secondary | ICD-10-CM | POA: Diagnosis not present

## 2016-11-24 DIAGNOSIS — I25118 Atherosclerotic heart disease of native coronary artery with other forms of angina pectoris: Secondary | ICD-10-CM | POA: Diagnosis present

## 2016-11-24 DIAGNOSIS — I509 Heart failure, unspecified: Secondary | ICD-10-CM | POA: Insufficient documentation

## 2016-11-24 DIAGNOSIS — E039 Hypothyroidism, unspecified: Secondary | ICD-10-CM | POA: Diagnosis not present

## 2016-11-24 DIAGNOSIS — Z955 Presence of coronary angioplasty implant and graft: Secondary | ICD-10-CM

## 2016-11-24 HISTORY — DX: Personal history of other diseases of the musculoskeletal system and connective tissue: Z87.39

## 2016-11-24 HISTORY — PX: CORONARY STENT INTERVENTION: CATH118234

## 2016-11-24 HISTORY — DX: Non-ST elevation (NSTEMI) myocardial infarction: I21.4

## 2016-11-24 HISTORY — PX: CORONARY ANGIOPLASTY WITH STENT PLACEMENT: SHX49

## 2016-11-24 HISTORY — DX: Pneumonia, unspecified organism: J18.9

## 2016-11-24 HISTORY — DX: Presence of coronary angioplasty implant and graft: Z95.5

## 2016-11-24 HISTORY — DX: Acute myocardial infarction, unspecified: I21.9

## 2016-11-24 HISTORY — PX: LEFT HEART CATH AND CORS/GRAFTS ANGIOGRAPHY: CATH118250

## 2016-11-24 HISTORY — DX: Unspecified osteoarthritis, unspecified site: M19.90

## 2016-11-24 LAB — PROTIME-INR
INR: 1.05
Prothrombin Time: 13.7 seconds (ref 11.4–15.2)

## 2016-11-24 LAB — CBC
HEMATOCRIT: 38.2 % — AB (ref 39.0–52.0)
HEMOGLOBIN: 12.7 g/dL — AB (ref 13.0–17.0)
MCH: 31.5 pg (ref 26.0–34.0)
MCHC: 33.2 g/dL (ref 30.0–36.0)
MCV: 94.8 fL (ref 78.0–100.0)
Platelets: 226 10*3/uL (ref 150–400)
RBC: 4.03 MIL/uL — AB (ref 4.22–5.81)
RDW: 13.4 % (ref 11.5–15.5)
WBC: 5.8 10*3/uL (ref 4.0–10.5)

## 2016-11-24 LAB — BASIC METABOLIC PANEL
Anion gap: 6 (ref 5–15)
BUN: 18 mg/dL (ref 6–20)
CO2: 23 mmol/L (ref 22–32)
Calcium: 9.1 mg/dL (ref 8.9–10.3)
Chloride: 111 mmol/L (ref 101–111)
Creatinine, Ser: 1.38 mg/dL — ABNORMAL HIGH (ref 0.61–1.24)
GFR calc Af Amer: 57 mL/min — ABNORMAL LOW (ref >60)
GFR, EST NON AFRICAN AMERICAN: 50 mL/min — AB (ref >60)
GLUCOSE: 98 mg/dL (ref 65–99)
POTASSIUM: 4.2 mmol/L (ref 3.5–5.1)
Sodium: 140 mmol/L (ref 135–145)

## 2016-11-24 LAB — POCT ACTIVATED CLOTTING TIME
Activated Clotting Time: 345 seconds
Activated Clotting Time: 390 seconds

## 2016-11-24 SURGERY — LEFT HEART CATH AND CORS/GRAFTS ANGIOGRAPHY
Anesthesia: LOCAL

## 2016-11-24 MED ORDER — SODIUM CHLORIDE 0.9% FLUSH: 3.0000 mL | INTRAVENOUS | Status: DC | PRN

## 2016-11-24 MED ORDER — ASPIRIN 81 MG PO CHEW
81.0000 mg | CHEWABLE_TABLET | ORAL | Status: AC
  Administered 2016-11-24: 81 mg via ORAL

## 2016-11-24 MED ORDER — MORPHINE SULFATE (PF) 2 MG/ML IV SOLN: 2.0000 mg | INTRAVENOUS | Status: DC | PRN

## 2016-11-24 MED ORDER — FENTANYL CITRATE (PF) 100 MCG/2ML IJ SOLN
INTRAMUSCULAR | Status: DC | PRN
  Administered 2016-11-24: 25 ug via INTRAVENOUS

## 2016-11-24 MED ORDER — CLOPIDOGREL BISULFATE 75 MG PO TABS
75.0000 mg | ORAL_TABLET | Freq: Every day | ORAL | Status: DC
  Administered 2016-11-25: 75 mg via ORAL
  Filled 2016-11-24: qty 1

## 2016-11-24 MED ORDER — ATORVASTATIN CALCIUM 40 MG PO TABS
40.0000 mg | ORAL_TABLET | ORAL | Status: DC
  Administered 2016-11-24: 22:00:00 40 mg via ORAL
  Filled 2016-11-24: qty 1

## 2016-11-24 MED ORDER — FENTANYL CITRATE (PF) 100 MCG/2ML IJ SOLN
INTRAMUSCULAR | Status: AC
  Filled 2016-11-24: qty 2

## 2016-11-24 MED ORDER — HEPARIN SODIUM (PORCINE) 1000 UNIT/ML IJ SOLN
INTRAMUSCULAR | Status: DC | PRN
Start: 2016-11-24 — End: 2016-11-24
  Administered 2016-11-24 (×2): 4500 [IU] via INTRAVENOUS

## 2016-11-24 MED ORDER — HEPARIN (PORCINE) IN NACL 2-0.9 UNIT/ML-% IJ SOLN
INTRAMUSCULAR | Status: DC | PRN
  Administered 2016-11-24: 1000 mL

## 2016-11-24 MED ORDER — IOPAMIDOL (ISOVUE-370) INJECTION 76%
INTRAVENOUS | Status: DC | PRN
  Administered 2016-11-24: 160 mL via INTRA_ARTERIAL

## 2016-11-24 MED ORDER — NITROGLYCERIN 1 MG/10 ML FOR IR/CATH LAB
INTRA_ARTERIAL | Status: AC
  Filled 2016-11-24: qty 10

## 2016-11-24 MED ORDER — EXERCISE FOR HEART AND HEALTH BOOK
Freq: Once | Status: DC
  Filled 2016-11-24 (×2): qty 1

## 2016-11-24 MED ORDER — LABETALOL HCL 5 MG/ML IV SOLN: 10.0000 mg | INTRAVENOUS | Status: AC | PRN

## 2016-11-24 MED ORDER — ASPIRIN 81 MG PO CHEW
81.0000 mg | CHEWABLE_TABLET | Freq: Every day | ORAL | Status: DC
  Administered 2016-11-25: 09:00:00 81 mg via ORAL
  Filled 2016-11-24: qty 1

## 2016-11-24 MED ORDER — SODIUM CHLORIDE 0.9% FLUSH
3.0000 mL | Freq: Two times a day (BID) | INTRAVENOUS | Status: DC
  Administered 2016-11-24: 3 mL via INTRAVENOUS

## 2016-11-24 MED ORDER — CLOPIDOGREL BISULFATE 300 MG PO TABS
ORAL_TABLET | ORAL | Status: AC
  Filled 2016-11-24: qty 1

## 2016-11-24 MED ORDER — MIDAZOLAM HCL 2 MG/2ML IJ SOLN
INTRAMUSCULAR | Status: AC
  Filled 2016-11-24: qty 2

## 2016-11-24 MED ORDER — ASPIRIN 81 MG PO CHEW
CHEWABLE_TABLET | ORAL | Status: AC
  Administered 2016-11-24: 81 mg via ORAL
  Filled 2016-11-24: qty 1

## 2016-11-24 MED ORDER — ACETAMINOPHEN 325 MG PO TABS: 650.0000 mg | ORAL_TABLET | ORAL | Status: DC | PRN

## 2016-11-24 MED ORDER — ONDANSETRON HCL 4 MG/2ML IJ SOLN: 4.0000 mg | Freq: Four times a day (QID) | INTRAMUSCULAR | Status: DC | PRN

## 2016-11-24 MED ORDER — MIDAZOLAM HCL 2 MG/2ML IJ SOLN
INTRAMUSCULAR | Status: DC | PRN
  Administered 2016-11-24 (×2): 1 mg via INTRAVENOUS

## 2016-11-24 MED ORDER — LEVOTHYROXINE SODIUM 88 MCG PO TABS
88.0000 ug | ORAL_TABLET | Freq: Every day | ORAL | Status: DC
  Administered 2016-11-25: 88 ug via ORAL
  Filled 2016-11-24: qty 1

## 2016-11-24 MED ORDER — HEPARIN (PORCINE) IN NACL 2-0.9 UNIT/ML-% IJ SOLN
INTRAMUSCULAR | Status: AC
  Filled 2016-11-24: qty 1000

## 2016-11-24 MED ORDER — SODIUM CHLORIDE 0.9% FLUSH
3.0000 mL | Freq: Two times a day (BID) | INTRAVENOUS | Status: DC
  Administered 2016-11-24: 22:00:00 3 mL via INTRAVENOUS

## 2016-11-24 MED ORDER — LIDOCAINE HCL (PF) 1 % IJ SOLN
INTRAMUSCULAR | Status: DC | PRN
  Administered 2016-11-24: 5 mL

## 2016-11-24 MED ORDER — SODIUM CHLORIDE 0.9 % IV SOLN
INTRAVENOUS | Status: AC
  Administered 2016-11-24 (×2): via INTRAVENOUS

## 2016-11-24 MED ORDER — LIDOCAINE HCL (PF) 1 % IJ SOLN
INTRAMUSCULAR | Status: AC
  Filled 2016-11-24: qty 30

## 2016-11-24 MED ORDER — SODIUM CHLORIDE 0.9 % WEIGHT BASED INFUSION
3.0000 mL/kg/h | INTRAVENOUS | Status: DC
  Administered 2016-11-24: 3 mL/kg/h via INTRAVENOUS

## 2016-11-24 MED ORDER — HYDRALAZINE HCL 20 MG/ML IJ SOLN: 5.0000 mg | INTRAMUSCULAR | Status: AC | PRN

## 2016-11-24 MED ORDER — METOPROLOL TARTRATE 25 MG PO TABS
25.0000 mg | ORAL_TABLET | Freq: Two times a day (BID) | ORAL | Status: DC
  Administered 2016-11-24 – 2016-11-25 (×2): 25 mg via ORAL
  Filled 2016-11-24 (×2): qty 1

## 2016-11-24 MED ORDER — ATORVASTATIN CALCIUM 40 MG PO TABS: 40.0000 mg | ORAL_TABLET | Freq: Every day | ORAL | Status: DC

## 2016-11-24 MED ORDER — VERAPAMIL HCL 2.5 MG/ML IV SOLN
INTRAVENOUS | Status: DC | PRN
  Administered 2016-11-24: 10 mL via INTRA_ARTERIAL

## 2016-11-24 MED ORDER — SODIUM CHLORIDE 0.9 % WEIGHT BASED INFUSION: 1.0000 mL/kg/h | INTRAVENOUS | Status: DC

## 2016-11-24 MED ORDER — SODIUM CHLORIDE 0.9 % IV SOLN: 250.0000 mL | INTRAVENOUS | Status: DC | PRN

## 2016-11-24 MED ORDER — CLOPIDOGREL BISULFATE 300 MG PO TABS
ORAL_TABLET | ORAL | Status: DC | PRN
  Administered 2016-11-24: 300 mg via ORAL

## 2016-11-24 MED ORDER — NITROGLYCERIN 1 MG/10 ML FOR IR/CATH LAB
INTRA_ARTERIAL | Status: DC | PRN
  Administered 2016-11-24: 200 ug via INTRACORONARY

## 2016-11-24 MED ORDER — ATORVASTATIN CALCIUM 20 MG PO TABS: 20.0000 mg | ORAL_TABLET | ORAL | Status: DC

## 2016-11-24 MED ORDER — ANGIOPLASTY BOOK
Freq: Once | Status: AC
  Administered 2016-11-24: 18:00:00
  Filled 2016-11-24: qty 1

## 2016-11-24 MED ORDER — VERAPAMIL HCL 2.5 MG/ML IV SOLN
INTRAVENOUS | Status: AC
  Filled 2016-11-24: qty 2

## 2016-11-24 SURGICAL SUPPLY — 27 items
BALLN EUPHORA RX 1.5X20 (BALLOONS) ×2
BALLN EUPHORA RX 2.0X20 (BALLOONS) ×2
BALLN MOZEC 1.5X15 (BALLOONS) ×2
BALLN ~~LOC~~ EMERGE MR 2.75X20 (BALLOONS) ×2
BALLOON EUPHORA RX 1.5X20 (BALLOONS) ×1 IMPLANT
BALLOON EUPHORA RX 2.0X20 (BALLOONS) ×1 IMPLANT
BALLOON MOZEC 1.5X15 (BALLOONS) ×1 IMPLANT
BALLOON ~~LOC~~ EMERGE MR 2.75X20 (BALLOONS) ×1 IMPLANT
CATH INFINITI 5FR ANG PIGTAIL (CATHETERS) ×2 IMPLANT
CATH INFINITI 5FR JL4 (CATHETERS) ×2 IMPLANT
CATH INFINITI JR4 5F (CATHETERS) ×2 IMPLANT
COVER PRB 48X5XTLSCP FOLD TPE (BAG) ×1 IMPLANT
COVER PROBE 5X48 (BAG) ×1
DEVICE RAD COMP TR BAND LRG (VASCULAR PRODUCTS) ×2 IMPLANT
GLIDESHEATH SLEND A-KIT 6F 22G (SHEATH) ×2 IMPLANT
GUIDE CATH RUNWAY 6FR FR4 (CATHETERS) ×2 IMPLANT
GUIDEWIRE INQWIRE 1.5J.035X260 (WIRE) ×1 IMPLANT
INQWIRE 1.5J .035X260CM (WIRE) ×2
KIT ENCORE 26 ADVANTAGE (KITS) ×2 IMPLANT
KIT ESSENTIALS PG (KITS) ×2 IMPLANT
KIT HEART LEFT (KITS) ×2 IMPLANT
PACK CARDIAC CATHETERIZATION (CUSTOM PROCEDURE TRAY) ×2 IMPLANT
STENT RESOLUTE ONYX 2.25X38 (Permanent Stent) ×2 IMPLANT
STENT RESOLUTE ONYX 2.5X18 (Permanent Stent) ×2 IMPLANT
TRANSDUCER W/STOPCOCK (MISCELLANEOUS) ×2 IMPLANT
TUBING CIL FLEX 10 FLL-RA (TUBING) ×2 IMPLANT
WIRE PT2 MS 185 (WIRE) ×2 IMPLANT

## 2016-11-24 NOTE — H&P (View-Only) (Signed)
PCP: [name], DO  Clinic Note: Chief Complaint  Patient presents with  . Follow-up    CAD, PAD, DOE(COPD)  . Shortness of Breath    frequently  . Dizziness    when sob  . Leg Pain    cramping in legs.     HPI: Michael [name] is a 71 y.o. male with a PMH below who presents today for Basically six-month follow-up for his significant CAD and PAD with risk factors. He has a history of CAD-CABG &PAD-L Iliac Stent &100% CTO of R Iliac with significant lumbar collaterals. He has baseline DOE from COPD that pre-dates his MI. This limits his daily activity level.  Michael [name] was last seen on 06/17/2016 -he noted that time, that his breathing is getting continuously and progressively worse. He is extremely limited by his exertional dyspnea more so than any claudication. He does note any claudication in the left side. He had a screening Myoview stress test done for his evaluation.  Recent Hospitalizations: N/A  Studies Reviewed:   Myoview 10/06/2016:: EF 62%. Medium sized, moderate severity defect in the basal inferior, mid inferoseptal and mid inferior walls is partially reversible.  Ao-Ileac & LEA Dopplers: RABI 0.8, LABI 1.2. B TBI 0.77 -- he has known bilateral disease status post left iliac stent. Right ABI has decreased and left ABI is normal; aortic iliacs showed aortoiliac atherosclerosis with chronically occluded right common iliac artery stable and normal right external iliac artery. Stable> percent left common iliac stenosis  Interval History: Michael [name] presents today really noticing that his exertional dyspnea is even worse than it has been before. He has not even really don't do exercise enough to notice claudication. He is thinking about talking to his primary physician about starting home oxygen. He is to the point where it that is concerning enough for him. He does not have any significant PND or orthopnea, but does have some mild edema on the left greater than the right.  He says he is short of breath just even at rest, but with any exertion is worse.  No palpitations, lightheadedness,  weakness or syncope/near syncope. Mild orthostatic dizziness No TIA/amaurosis fugax symptoms. No melena, hematochezia, hematuria, or epstaxis. Is not walking enough to determine if he is having claudication.  ROS: A comprehensive was performed. Review of Systems  Constitutional: Negative for malaise/fatigue (Just doesn't have energy to do things because of his dyspnea).  HENT: Negative for congestion.   Respiratory: Positive for cough (In the morning), shortness of breath and wheezing.   Cardiovascular: Positive for leg swelling (L>R).  Gastrointestinal: Negative for abdominal pain.  Musculoskeletal: Positive for joint pain (Significant right hip pain).  Psychiatric/Behavioral: Positive for memory loss (Has noted memory issues since his surgery.).  All other systems reviewed and are negative.   Past Medical History:  Diagnosis Date  . CAD, multiple vessel 05/2011   90% mid LAD with diffuse proximal stenosis, 100% proximal RCA --> s/p CABG; b) Lexiscan Myoview 09/2011: fixed inferior defect (? artifact), no ischemia, EF 69%, no WMA  . Carotid artery occlusion   . COPD (chronic obstructive pulmonary disease) with emphysema ([place])   . Essential hypertension   . History of non-ST elevation myocardial infarction (NSTEMI) 06/05/2011   With CHF - probably late presentation for missed STEMI --> CATH- MV CAD;; Echo 07/2001: EF > 55%, mild MAC, mild TR, no WMA  with Post-op Setpal dyskinesis, Apex not seen)  . History of tobacco abuse    quit 05/2011  .  Hyperlipidemia with target LDL less than 70   . Hypothyroidism   . PAD (peripheral artery disease) ([place]) 06/08/2011   a) R Common Iliac 100%, L Common Iliac 80+% occlusion; b) L CIA - Aterectomy- 10 mm x 40 mm self-extending Stent (postdilated to 7 mm with 20-30% residual)--> Deferred R AoFem bypass;; c) LEA Dopplers 04/2012 & 04/2014  - RABI 0.8 & LABI 1.1 - STABLE, no change  . 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 CARDIAC CATHETERIZATION  06/09/2011  . CORONARY ARTERY BYPASS GRAFT  06/10/2011   oLIMA to LAD, SVG to PDA (Dr. Servando Snare)  . ILIAC ARTERY STENT  10/27/2011   left CIA - 36mx40mm self-expanding stent, decreasing downt to 20-30% lesion, 100% occluded R CIA with extensive lumbar collateralization to R internal iliac;  05/2016: Ao-Ileac & LEA Dopplers: RABI 0.8, LABI 1.2. B TBI 0.77 -- he has known B disease s/p L CIA stent. RLABI decreased and LABI is normal; aortoiliac atherosclerosis w/ CTO of R CIA but normal REIA.  .Marland KitchenNM MYOVIEW LTD  February 2013   LOW RISK: Fixed basal-mid inferior diaphragmatic attenuation artifact. No ischemia. EF 69%.  .Marland KitchenPERCUTANEOUS STENT INTERVENTION Left 10/27/2011   Procedure: PERCUTANEOUS STENT INTERVENTION;  Surgeon: JLorretta Harp MD;  [facility];  Service: Cardiovascular;  Laterality: Left;  . TONSILLECTOMY  1974  . TRANSTHORACIC ECHOCARDIOGRAM  08/19/2011   EF=>55%; mild mitral annular calcif; mild TR    Current Meds  Medication Sig  . atorvastatin (LIPITOR) 20 MG tablet ALTERNATE 40 MG (2 TABLETS) EVERY OTHER DAY WITH 20 MG ( 1 TABLET) AT BEDTIME.  . fenofibrate (TRICOR) 48 MG tablet TAKE 1 TABLET BY MOUTH DAILY  . levothyroxine (SYNTHROID, LEVOTHROID) 88 MCG tablet   . losartan (COZAAR) 25 MG tablet TAKE 1 TABLET BY MOUTH ONCE DAILY  . metoprolol tartrate (LOPRESSOR) 25 MG tablet TAKE 1 TABLET BY MOUTH TWICE DAILY  . [DISCONTINUED] clopidogrel (PLAVIX) 75 MG tablet TAKE 1 TABLET BY MOUTH EVERY OTHER DAY  . [DISCONTINUED] levothyroxine (SYNTHROID, LEVOTHROID) 75 MCG tablet Take 1 tablet by mouth daily.    Allergies  Allergen Reactions  . Ace Inhibitors Cough    Social History   Social History  . Marital status: Divorced    Spouse name: N/A  . Number of children: 3  . Years of education:  N/A   Social History Main Topics  . Smoking status: Former Smoker    Packs/day: 3.00    Years: 50.00    Types: Cigarettes    Quit date: 06/08/2011  . Smokeless tobacco: Former [employer]   Types: Chew    Quit date: 07/13/2003  . Alcohol use 0.0 oz/week     Comment: Pt reports drinking regularly and heavily, reports typically drinking 12-pack in a day, "but not every day"  . Drug use: No  . Sexual activity: No   Other Topics Concern  . None   Social History Narrative  . None    family history includes CAD in his sister; Diabetes in his mother; Heart disease in his mother; Heart failure in his mother; Pneumonia in his father; Stroke in his sister.  Wt Readings from Last 3 Encounters:  11/12/16 96.7 kg (213 lb 3.2 oz)  10/06/16 94.8 kg (209 lb)  06/17/16 95.2 kg (209 lb 12.8 oz)    PHYSICAL EXAM BP 134/73   Pulse 69   Ht _0  (1.702  m)   Wt 96.7 kg (213 lb 3.2 oz)   BMI 33.39 kg/m  Gen: Pleasant mood and affect. Overall healthy-appearing. No acute distress. Neck: no adenopathy, no JVD, supple, Soft right-sided carotid bruit  Lungs: Nonlabored but baseline accessory muscle use. Increased AP diameter. Mild interstitial sounds, but no true wheezes rales or rhonchi. Just diminished breath sounds throughout.  Heart: regular rate and rhythm, S1& S2 normal, no murmur, click, rub or gallop and normal apical impulse  Abdomen: soft, non-tender; bowel sounds normal; no masses, no organomegaly and Mild truncal obesity  Extremities: extremities normal, atraumatic, no cyanosis or edema, no edema, redness or tenderness in the calves or thighs and no ulcers, gangrene or trophic changes  Pulses: Essentially normal 2+ in the left leg, but 1+ on the right DP and PT  Neurologic: Grossly normal     Adult ECG Report N/A  Other studies Reviewed: Additional studies/ records that were reviewed today include:  Recent Labs:   Lab Results  Component Value Date   CHOL 154 05/16/2013   HDL  32 (L) 05/16/2013   LDLCALC 61 05/16/2013   TRIG 305 (H) 05/16/2013   CHOLHDL 4.8 05/16/2013    ASSESSMENT / PLAN: Problem List Items Addressed This Visit    Abnormal nuclear stress test - Primary (Chronic)    Bit concerned about his Myoview. He was read as intermediate risk. I'm not surprised that he has an inferior infarct, but I'm a little bit worried about the lateral and anterior reversibility. He has not had any anginal symptoms, but really never did in the past it was mostly dyspnea and heart failure. With his progressively worsening dyspnea and the desire to finally wear home oxygen, I think we need to exclude any progression of his coronary disease. I reviewed his Myoview 2013, and I do see some notable differences with reversibility.  Plan: After a very long time talking about the findings and his symptoms, he agreed that was reasonable to proceed with left heart catheterization with coronary graft angiography and possible PCI.  The procedure with Risks/Benefits/Alternatives and Indications was reviewed with the patient.  All questions were answered.    Risks / Complications include, but not limited to: Death, MI, CVA/TIA, VF/VT (with defibrillation), Bradycardia (need for temporary pacer placement), contrast induced nephropathy, bleeding / bruising / hematoma / pseudoaneurysm, vascular or coronary injury (with possible emergent CT or Vascular Surgery), adverse medication reactions, infection.  Additional risks involving the use of radiation with the possibility of radiation burns and cancer were explained in detail.  The patient voices understanding and agrees to proceed.       Relevant Orders   DG Chest 2 View (Completed)   Lipid panel   Hepatic function panel   TSH   Protime-INR   APTT   CBC with Differential/Platelet   Basic Metabolic Panel (BMET)   CAD, multiple vessel - status post CABG: LIMA-LAD, SVG-RPDA (Chronic)    He never really did have angina. Most of his  symptoms were related to heart failure and dyspnea. He is stable regimen with Plavix alone, low-dose ARB and beta blocker along with the atorvastatin and fenofibrate.  - Concerning changes on his Myoview with plans for proceeding with cardiac catheterization. Now 5-1/2 years out from CABG. On stable regimen for now.      Relevant Orders   DG Chest 2 View (Completed)   Lipid panel   Hepatic function panel   TSH   Protime-INR   APTT   CBC  with Differential/Platelet   Basic Metabolic Panel (BMET)   Chronic respiratory failure with hypoxia (HCC) - SaO2 drops to 87% with ambulation (Chronic)    Annuloplasty, he finally agreed to talk about using oxygen at home with his PCP. We are excluding any worsening/progression of coronary disease as a potential culprit for his worsening dyspnea as well.      Relevant Orders   DG Chest 2 View (Completed)   Lipid panel   Hepatic function panel   TSH   Protime-INR   APTT   CBC with Differential/Platelet   Basic Metabolic Panel (BMET)   Essential hypertension (Chronic)    Well-controlled blood pressure now. He was dizzy when we had him on higher medicines. Therefore we reduced medications and split up the timing of his medications. Dizziness is improved. Pressure stable.      Hyperlipidemia with target LDL less than 70 (Chronic)    I have not seen that much of a recent labs. With his precath labs will good recheck a lipid panel for new baseline on the Epic computer system. He continues to be on atorvastatin alternating every other day along with fenofibrate.      Relevant Orders   DG Chest 2 View (Completed)   Lipid panel   Hepatic function panel   TSH   Protime-INR   APTT   CBC with Differential/Platelet   Basic Metabolic Panel (BMET)   PAD (peripheral artery disease) - Right Common Iliac Occlusion, Left Common Iliac 80+% occlusion (Chronic)    His Dopplers look stable from October. Plans to be to continue following up annually. He is on  Plavix, statin and along with beta blocker and ARB. I recommend that he continues walking which he will probably need to be on oxygen to do the future.      S/P CABG x 2 (Chronic)   Relevant Orders   DG Chest 2 View (Completed)   Lipid panel   Hepatic function panel   TSH   Protime-INR   APTT   CBC with Differential/Platelet   Basic Metabolic Panel (BMET)      Very long patient visit. I spent at least 8mnutes with the patient trying to go to his symptoms and the findings on his stress test as well the plan going forward. He is very reluctant to acknowledge that his symptoms are worsening and therefore needs reevaluated. He does understand that his bypass surgery was not a permanent fix. I discussed along time to explain things to him.  > 50% of the time was spent in direct counseling.  Current medicines are reviewed at length with the patient today. (+/- concerns) N/A The following changes have been made: ==?  Patient Instructions  Medication Instructions: Your physician recommends that you continue on your current medications as directed. Please refer to the Current Medication list given to you today.  Testing/Procedures: Heart Catheterization first week of April with Dr. HSheliah Hatch Abnormal Nuclear Stress Test  Studies Ordered:   Orders Placed This Encounter  Procedures  . DG Chest 2 View  . Lipid panel  . Hepatic function panel  . TSH  . Protime-INR  . APTT  . CBC with Differential/Platelet  . Basic Metabolic Panel (BMET)   f/u post-cath    DGlenetta Hew M.D., M.S. Interventional Cardiologist   Pager # [phone] # [phone] [address] [place] [address]

## 2016-11-24 NOTE — Interval H&P Note (Signed)
History and Physical Interval Note:  11/24/2016 8:46 AM  Michael [name]  has presented today for surgery, with the diagnosis of abnormal stress test & known CAD-CABG.  The various methods of treatment have been discussed with the patient and family. After consideration of risks, benefits and other options for treatment, the patient has consented to  Procedure(s): Left Heart Cath and Coronary Angiography (N/A) with possible Percutaneous Coronary Intervention as a surgical intervention .  The patient's history has been reviewed, patient examined, no change in status, stable for surgery.  I have reviewed the patient's chart and labs.  Questions were answered to the patient's satisfaction.    Cath Lab Visit (complete for each Cath Lab visit)  Clinical Evaluation Leading to the Procedure:   ACS: No.  Non-ACS:    Anginal Classification: CCS III - dyspnea  Anti-ischemic medical therapy: Minimal Therapy (1 class of medications)  Non-Invasive Test Results: Intermediate-risk stress test findings: cardiac mortality 1-3%/year  Prior CABG: Previous CABG   Bryan Lemma

## 2016-11-24 NOTE — Care Management Note (Signed)
Case Management Note  Patient Details  Name: Michael [name] MRN: [id number] Date of Birth: 09-Nov-1945  Subjective/Objective:   s/p stent intervention, will be on plavix  And asa, NCM will cont to follow for dc needs.                 Action/Plan:   Expected Discharge Date:                  Expected Discharge Plan:  Home/Self Care  In-House Referral:     Discharge planning Services  CM Consult  Post Acute Care Choice:    Choice offered to:     DME Arranged:    DME Agency:     HH Arranged:    HH Agency:     Status of Service:  In process, will continue to follow  If discussed at Long Length of Stay Meetings, dates discussed:    Additional Comments:  Leone Haven, RN 11/24/2016, 4:06 PM

## 2016-11-24 NOTE — Progress Notes (Signed)
Received patient from cath lab.  TR band to Left wrist with 15 cc of air.  No bleeding, hematoma noted at site.  Will continue to monitor, see flowsheet for further assessment.  Patient has no complaints of pain at this time.  Daughter called and notified that patient is in room 6C-02.  Vital signs stable, will continue to monitor closely.

## 2016-11-24 NOTE — Discharge Summary (Signed)
Discharge Summary    Patient ID: Michael [name],  MRN: [id number], DOB/AGE: 1946/01/31 71 y.o.  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 Ace Inhibitors Cough    Diagnostic Studies/Procedures    LHC: 11/24/16  Conclusion     LV end diastolic pressure is normal. The left ventricular ejection fraction is 55-65% by visual estimate.  The left ventricular systolic function is normal.  ________________________________  Prox LAD to Mid LAD lesion, 65 %stenosed after D1. Then Mid LAD lesion, 100 %stenosed. Distal LAD fills via LIMA  LIMA-LAD and is normal in caliber and anatomically normal.  SVG-RPDA: Origin to Prox Graft lesion, 100 %stenosed.  Prox RCA lesion, 70 %stenosed. Mid RCA-1 lesion, 99 %stenosed. Mid RCA-2 lesion, 65 %stenosed.  A STENT RESOLUTE ONYX [id number] drug eluting stent was successfully placed.  A STENT RESOLUTE ONYX 2.5X18 drug eluting stent was successfully placed, and overlaps previously placed stent.  Post intervention, there is a 0% residual stenosis.   Successful revascularization of the previously occluded RCA using 2 overlapping DES stents tapered from proximal to distal.  Otherwise stable circumflex, proximal LAD to Diag1 and patent LIMA-LAD.  Plan:  Based on his multiple comorbidities, I did not feel that he was good candidate for same-day discharge. He will therefore be monitored overnight with IV hydration.  He will restart aspirin and then change his Plavix back to daily. - Can discuss stopping aspirin at 1-3 months  Anticipate discharge tomorrow on current home medications with adjustments  noted.   _____________   History of Present Illness     Michael [name] is a 71 yo male with PMH of CAD CABG, PAD (L-iliac stent/100% R iliac with collaterals), HL, HTN, COPD and tobacco use who presented to the office on 11/14/16 for routine follow up. He underwent a myoview stress test on 10/06/16 that showed EF of 62%, with medium sized, moderate severity defect in the basal inferior, mid inferoseptal and mid inferior wall that was partially reversible. He reported progressive dyspnea with mild LE edema. Given myoview and reported symptoms he was referred for outpatient cath.   Hospital Course     Consultants: None   Presented 11/24/16 and underwent LHC with Dr. Herbie [name] showing 100% stenosed SVG-RPDA, but patent LIMA to the LAD. pRCA 70%, mRCA 99%, and 65% mRCA 2 lesion, treated successfully with DES x2. Plan for DAPT with ASA and Plavix for 3 months, then consider stopping ASA.    He did well post cath, though did have some shortness of breath with activity. Noted hx of COPD. Noted to have decreased O2 sats with ambulation while working with cardiac rehab. Plan to send home with O2, with close follow up with his PCP. Radial cath site was stable. Labs post cath showed slight increase in Cr 1.3>>1.6. Will plan to check BMET on 11/30/16. Plan to hold losartan until he has BMET done. He was seen and examined by Dr. Herbie [name] and determined stable for discharge home. Follow up has been arranged.   ____________  Discharge Vitals Blood pressure (!) 145/66, pulse 66, temperature 99 F (37.2 C), temperature source Oral, resp. rate 17, height 5'  7" (1.702 m), weight 215 lb 13.3 oz (97.9 kg), SpO2 96 %.  Filed Weights   11/24/16 0713 11/24/16 1114 11/25/16 0200  Weight: 213 lb (96.6 kg) 213 lb (96.6 kg) 215 lb 13.3 oz (97.9 kg)   Physical Exam:  Gen: Pleasant mood and affect. Overall healthy-appearing. No acute distress. Neck: no adenopathy, no JVD, supple, Soft right-sided carotid bruit  Lungs: Nonlabored  but baseline accessory muscle use. Increased AP diameter. Mild interstitial sounds, but no true wheezes rales or rhonchi. Just diminished breath sounds throughout.  Heart: regular rate and rhythm, S1&S2 normal, no murmur, click, rub or gallop and normal apical impulse  Abdomen: soft, non-tender; bowel sounds normal; no masses, no organomegaly and Mild truncal obesity  Extremities: extremities normal, atraumatic, no cyanosis or edema, no edema. Left radial cath site stable with mild bruising.   Pulses: Essentially normal 2+ in the left leg, but 1+ on the right DP and PT  Neurologic: Alert and Oriented x3.   Labs & Radiologic Studies    CBC  Recent Labs  11/24/16 0736 11/25/16 0233  WBC 5.8 6.1  HGB 12.7* 10.9*  HCT 38.2* 33.2*  MCV 94.8 94.3  PLT 226 212   Basic Metabolic Panel  Recent Labs  11/24/16 0736 11/25/16 0233  NA 140 140  K 4.2 4.2  CL 111 112*  CO2 23 24  GLUCOSE 98 100*  BUN 18 20  CREATININE 1.38* 1.60*  CALCIUM 9.1 8.4*   Liver Function Tests No results for input(s): AST, ALT, ALKPHOS, BILITOT, PROT, ALBUMIN in the last 72 hours. No results for input(s): LIPASE, AMYLASE in the last 72 hours. Cardiac Enzymes No results for input(s): CKTOTAL, CKMB, CKMBINDEX, TROPONINI in the last 72 hours. BNP Invalid input(s): POCBNP D-Dimer No results for input(s): DDIMER in the last 72 hours. Hemoglobin A1C No results for input(s): HGBA1C in the last 72 hours. Fasting Lipid Panel No results for input(s): CHOL, HDL, LDLCALC, TRIG, CHOLHDL, LDLDIRECT in the last 72 hours. Thyroid Function Tests No results for input(s): TSH, T4TOTAL, T3FREE, THYROIDAB in the last 72 hours.  Invalid input(s): FREET3 _____________  Dg Chest 2 View  Result Date: 11/13/2016 CLINICAL DATA:  Pre procedure for heart catheterization, abnormal stress test, CAD, hx CABG 05/2011, HTN, COPD, there are no current chest complaints EXAM: CHEST  2 VIEW COMPARISON:  07/13/2011; 06/08/2011  FINDINGS: Grossly unchanged cardiac silhouette and mediastinal contours. Post median sternotomy and CABG. Decreased lung volumes with worsening bibasilar heterogeneous opacities, left greater than right. Biapical parenchymal thinning is suspected, left greater than right. Unchanged trace partially loculated left-sided effusion. No evidence of edema. No pneumothorax. No acute osseus abnormalities. IMPRESSION: Worsening bibasilar atelectasis / scar without definitive superimposed acute cardiopulmonary disease on this hypoventilated examination. Electronically Signed   By: Simonne Come M.D.   On: 11/13/2016 09:35   Disposition   Pt is being discharged home today in good condition.  Follow-up Plans & Appointments    Follow-up Information    COLLINS, DANA, DO Follow up.   Specialty:  Family Medicine Why:  Within 2 weeks to follow up on your COPD Contact information: [address] [place] [address] [phone]        [facility] Follow up on 11/30/2016.   Specialty:  Cardiology Why:  Please come in for follow up labs.  Contact information: [address] [place] [address] [phone]       [facility], [place] Follow up on  12/03/2016.   Specialties:  Physician Assistant, Cardiology Why:  at 8am for your follow up appt.  Contact information: [address] [place] [address] [phone]          Discharge Instructions    Amb Referral to Cardiac Rehabilitation    Complete by:  As directed    Diagnosis:  Coronary Stents   Call MD for:  redness, tenderness, or signs of infection (pain, swelling, redness, odor or green/yellow discharge around incision site)    Complete by:  As directed    Diet - low sodium heart healthy    Complete by:  As directed    Discharge instructions    Complete by:  As directed    Radial Site Care Refer to this sheet in the next few weeks. These instructions provide you with  information on caring for yourself after your procedure. Your caregiver may also give you more specific instructions. Your treatment has been planned according to current medical practices, but problems sometimes occur. Call your caregiver if you have any problems or questions after your procedure. HOME CARE INSTRUCTIONS You may shower the day after the procedure.Remove the bandage (dressing) and gently wash the site with plain soap and water.Gently pat the site dry.  Do not apply powder or lotion to the site.  Do not submerge the affected site in water for 3 to 5 days.  Inspect the site at least twice daily.  Do not flex or bend the affected arm for 24 hours.  No lifting over 5 pounds (2.3 kg) for 5 days after your procedure.  Do not drive home if you are discharged the same day of the procedure. Have someone else drive you.  You may drive 24 hours after the procedure unless otherwise instructed by your caregiver.  What to expect: Any bruising will usually fade within 1 to 2 weeks.  Blood that collects in the tissue (hematoma) may be painful to the touch. It should usually decrease in size and tenderness within 1 to 2 weeks.  SEEK IMMEDIATE MEDICAL CARE IF: You have unusual pain at the radial site.  You have redness, warmth, swelling, or pain at the radial site.  You have drainage (other than a small amount of blood on the dressing).  You have chills.  You have a fever or persistent symptoms for more than 72 hours.  You have a fever and your symptoms suddenly get worse.  Your arm becomes pale, cool, tingly, or numb.  You have heavy bleeding from the site. Hold pressure on the site.     Please stop taking your losartan until Monday when you come in for your lab work. We will let you know when to resume this medication.   For home use only DME oxygen    Complete by:  As directed    Mode or (Route):  Nasal cannula   Liters per Minute:  2   Frequency:  Continuous (stationary and portable  oxygen unit needed)   Oxygen delivery system:  Gas   Increase activity slowly    Complete by:  As directed       Discharge Medications   Current Discharge Medication List    START taking these medications   Details  aspirin 81 MG chewable tablet Chew 1 tablet (81 mg total) by mouth daily.    nitroGLYCERIN (NITROSTAT) 0.4 MG SL tablet Place 1 tablet (0.4 mg total) under the tongue every 5 (five) minutes as needed. Qty: 25 tablet, Refills:  3      CONTINUE these medications which have CHANGED   Details  clopidogrel (PLAVIX) 75 MG tablet Take 1 tablet (75 mg total) by mouth daily. Qty: 90 tablet, Refills: 3      CONTINUE these medications which have NOT CHANGED   Details  atorvastatin (LIPITOR) 20 MG tablet ALTERNATE 40 MG (2 TABLETS) EVERY OTHER DAY WITH 20 MG ( 1 TABLET) AT BEDTIME. Qty: 180 tablet, Refills: 3    fenofibrate (TRICOR) 48 MG tablet TAKE 1 TABLET BY MOUTH DAILY Qty: 90 tablet, Refills: 1    levothyroxine (SYNTHROID, LEVOTHROID) 88 MCG tablet Take 88 mcg by mouth daily before breakfast.     metoprolol tartrate (LOPRESSOR) 25 MG tablet TAKE 1 TABLET BY MOUTH TWICE DAILY Qty: 60 tablet, Refills: 11      STOP taking these medications     losartan (COZAAR) 25 MG tablet          Aspirin prescribed at discharge?  Yes High Intensity Statin Prescribed? (Lipitor 40-80mg  or Crestor 20-40mg ): Yes Beta Blocker Prescribed? Yes For EF <40%, was ACEI/ARB Prescribed? Yes ADP Receptor Inhibitor Prescribed? (i.e. Plavix etc.-Includes Medically Managed Patients): Yes For EF <40%, Aldosterone Inhibitor Prescribed? No: EF ok Was EF assessed during THIS hospitalization? Yes Was Cardiac Rehab II ordered? (Included Medically managed Patients): Yes   Outstanding Labs/Studies   BMET 11/30/16  Duration of Discharge Encounter   Greater than 30 minutes including physician time.  Signed, Laverda Page NP-C 11/25/2016, 10:01 AM  I saw & evaluated the patient this AM along  with Ms. Su Hilt, NP.  I agree with her findings, exam & summary as noted.     Bryan Lemma, M.D., M.S. Interventional Cardiologist   Pager # [phone] Phone # [phone] [address] [place], [place] [address]

## 2016-11-25 ENCOUNTER — Encounter (HOSPITAL_COMMUNITY): Payer: Self-pay | Admitting: Cardiology

## 2016-11-25 ENCOUNTER — Other Ambulatory Visit: Payer: Self-pay

## 2016-11-25 ENCOUNTER — Other Ambulatory Visit: Payer: Self-pay | Admitting: Cardiology

## 2016-11-25 DIAGNOSIS — I2582 Chronic total occlusion of coronary artery: Secondary | ICD-10-CM | POA: Diagnosis not present

## 2016-11-25 DIAGNOSIS — N179 Acute kidney failure, unspecified: Secondary | ICD-10-CM

## 2016-11-25 DIAGNOSIS — I2510 Atherosclerotic heart disease of native coronary artery without angina pectoris: Secondary | ICD-10-CM | POA: Diagnosis not present

## 2016-11-25 DIAGNOSIS — I2584 Coronary atherosclerosis due to calcified coronary lesion: Secondary | ICD-10-CM | POA: Diagnosis not present

## 2016-11-25 DIAGNOSIS — I252 Old myocardial infarction: Secondary | ICD-10-CM | POA: Diagnosis not present

## 2016-11-25 LAB — BASIC METABOLIC PANEL
Anion gap: 4 — ABNORMAL LOW (ref 5–15)
BUN: 20 mg/dL (ref 6–20)
CALCIUM: 8.4 mg/dL — AB (ref 8.9–10.3)
CO2: 24 mmol/L (ref 22–32)
CREATININE: 1.6 mg/dL — AB (ref 0.61–1.24)
Chloride: 112 mmol/L — ABNORMAL HIGH (ref 101–111)
GFR calc Af Amer: 48 mL/min — ABNORMAL LOW (ref >60)
GFR, EST NON AFRICAN AMERICAN: 41 mL/min — AB (ref >60)
GLUCOSE: 100 mg/dL — AB (ref 65–99)
Potassium: 4.2 mmol/L (ref 3.5–5.1)
Sodium: 140 mmol/L (ref 135–145)

## 2016-11-25 LAB — CBC
HCT: 33.2 % — ABNORMAL LOW (ref 39.0–52.0)
HEMOGLOBIN: 10.9 g/dL — AB (ref 13.0–17.0)
MCH: 31 pg (ref 26.0–34.0)
MCHC: 32.8 g/dL (ref 30.0–36.0)
MCV: 94.3 fL (ref 78.0–100.0)
PLATELETS: 212 10*3/uL (ref 150–400)
RBC: 3.52 MIL/uL — ABNORMAL LOW (ref 4.22–5.81)
RDW: 13.3 % (ref 11.5–15.5)
WBC: 6.1 10*3/uL (ref 4.0–10.5)

## 2016-11-25 MED ORDER — NITROGLYCERIN 0.4 MG SL SUBL: 0.4000 mg | SUBLINGUAL_TABLET | SUBLINGUAL | 3 refills | Status: DC | PRN

## 2016-11-25 MED ORDER — ASPIRIN 81 MG PO CHEW: 81.0000 mg | CHEWABLE_TABLET | Freq: Every day | ORAL | Status: DC

## 2016-11-25 MED ORDER — CLOPIDOGREL BISULFATE 75 MG PO TABS: 75.0000 mg | ORAL_TABLET | Freq: Every day | ORAL | 3 refills | Status: DC

## 2016-11-25 NOTE — Progress Notes (Signed)
SATURATION QUALIFICATIONS: (This note is used to comply with regulatory documentation for home oxygen)  Patient Saturations on Room Air at Rest = 93%  Patient Saturations on Room Air while Ambulating = 87%  Patient Saturations on 2 Liters of oxygen while Ambulating = 90%  Please briefly explain why patient needs home oxygen:  Needs oxygen to keep sats up when walking

## 2016-11-25 NOTE — Progress Notes (Signed)
CARDIAC REHAB PHASE I   PRE:  Rate/Rhythm: 66 SR  BP:  Supine: 145/66  Sitting:   Standing:    SaO2: 96% RA in bed   93% in room  MODE:  Ambulation: 500 ft   POST:  Rate/Rhythm: 85 SR  BP:  Supine:   Sitting: 129/66  Standing:    SaO2: 87% RA,  90-92% 2L 0810-0910 Pt desat on RA as documented and needs home oxygen. Pt walked 500 ft independently on 2L. Pt gets SOB with talking and with activity. Left on 2L for him to recover from walk. Case manager to see. Stressed importance of plavix with stent. Stated he has been on and knows it is every day now,not every other day as before. Reviewed NTG use. Pt stated his walking is limited by claudication and SOB. Encouraged walking as tolerated. Will refer to CRP 2 [place] but pt states he cannot do Cardiac or Pulmonary rehab as he cannot tolerate. Will send note with referral stating pt does not feel he can do . Encouraged heart healthy diet but pt states he does not cook much, lives alone and eats out at a little restaurant near his house. Encouraged watching sodium. Doubt pt will change eating habits. Left heart healthy diet.    Luetta Nutting, RN BSN  11/25/2016 9:04 AM

## 2016-11-25 NOTE — Care Management Note (Signed)
Case Management Note  Patient Details  Name: Michael [name] MRN: [id number] Date of Birth: Apr 02, 1946  Subjective/Objective:    s/p stent intervention, will be on plavix  And asa,patient will need home oxygen, patient chose [facility] , referral made to [facility], he brought oxygen up to patient's room and will take care of home set up also. Patient is for dc today.                 Action/Plan:   Expected Discharge Date:  11/25/16               Expected Discharge Plan:  Home/Self Care  In-House Referral:     Discharge planning Services  CM Consult  Post Acute Care Choice:  Durable Medical Equipment Choice offered to:  Patient  DME Arranged:  Oxygen DME Agency:  [facility]  HH Arranged:    [facility] Agency:     Status of Service:  Completed, signed off  If discussed at Long Length of Stay Meetings, dates discussed:    Additional Comments:  Leone Haven, RN 11/25/2016, 12:39 PM

## 2016-11-26 ENCOUNTER — Telehealth (HOSPITAL_COMMUNITY): Payer: Self-pay | Admitting: Family Medicine

## 2016-11-26 NOTE — Telephone Encounter (Signed)
Referral canceled. Per note on Cardiac Rehab Card from Phase 1, patient does not want to be called for scheduling Cardiac Rehab. Referral canceled.

## 2016-11-30 ENCOUNTER — Other Ambulatory Visit: Payer: Self-pay | Admitting: *Deleted

## 2016-11-30 DIAGNOSIS — Z79899 Other long term (current) drug therapy: Secondary | ICD-10-CM

## 2016-11-30 LAB — BASIC METABOLIC PANEL
BUN: 19 mg/dL (ref 7–25)
CHLORIDE: 109 mmol/L (ref 98–110)
CO2: 26 mmol/L (ref 20–31)
Calcium: 9 mg/dL (ref 8.6–10.3)
Creat: 1.26 mg/dL — ABNORMAL HIGH (ref 0.70–1.18)
Glucose, Bld: 98 mg/dL (ref 65–99)
POTASSIUM: 4.8 mmol/L (ref 3.5–5.3)
SODIUM: 142 mmol/L (ref 135–146)

## 2016-11-30 NOTE — Progress Notes (Signed)
Cardiology Office Note    Date:  12/03/2016   ID:  Michael [name], DOB [birth year], MRN [id number]  PCP:  Michael Morning, DO  Cardiologist:  Dr. Ellyn Hack  Chief Complaint  Patient presents with  . Hospitalization Follow-up    History of Present Illness:    Michael [name] is a 71 y.o. male with past medical history of CAD (s/p CABG in 2012), HTN, HLD, PAD (s/p L iliac stent, 100% occlusion of R iliac with collaterals present), and COPD who presents to the office today for hospital follow-up.   He was recently admitted from 4/3 - 11/25/2016 for a cardiac catheterization after being seen in the office by Dr. Ellyn Hack on 11/14/2016 and reporting worsening dyspnea on exertion. A stress test had been performed in 09/2016 and was intermediate-risk due to a defect along the basal inferior and apical inferior location which was partially reversible and considering for ischemia. With his worsening symptoms and abnormal stress test, a cardiac catheterization was recommended for definitive evaluation. Cath was performed on 4/3 and showed 100% stenosis of the SVG-RPDA with patent LIMA-LAD, 70% pRCA stenosis, 99% mRCA stenosis, and 65% mRCA-2 lesion. PCI was performed with 2 overlapping DES to the RCA. He will be on DAPT with ASA and Plavix for 3 months, then can consider stopping ASA. When ambulating the following day, he continued to experience dyspnea on exertion and O2 saturations were noted to drop to 87% on RA. Home O2 was arranged prior to discharge.  In talking with the patient today, he denies any recent chest pain. Still experiencing dyspnea on exertion. He has been using his home O2 and reports significant improvement with this but notes dyspnea when not using the O2. He has a portable O2 tank but refuses to use it. He has known COPD and has never been evaluated by a Pulmonologist. He is not open to seeing one at this time, saying "I already see too many doctors". He used an inhaler in the past (unsure which  one) but says this did not help with his symptoms.   He reports good compliance with his ASA and Plavix. Denies any evidence of active bleeding. Is anxious to stop ASA and he reports this damaged his kidneys in the past. Creatinine peaked at 1.60 following his catheterization, improved to 1.26 when just checked on 4/9//2018.    Past Medical History:  Diagnosis Date  . Arthritis    "right hip; right wrist" (11/24/2016)  . CAD, multiple vessel 05/2011   90% mid LAD with diffuse proximal stenosis, 100% proximal RCA --> s/p CABG; b) Lexiscan Myoview 09/2011: fixed inferior defect (? artifact), no ischemia, EF 69%, no WMA c) 11/24/16 PCI with DES x2 RCA  . Carotid artery occlusion   . COPD (chronic obstructive pulmonary disease) with emphysema ([place])   . Essential hypertension   . History of gout   . History of tobacco abuse    quit 05/2011  . Hyperlipidemia with target LDL less than 70   . Hypothyroidism   . MI (myocardial infarction) 05/2011   "I had 3 in 2 days"  . NSTEMI (non-ST elevated myocardial infarction) ([place]) 06/05/2011   With CHF - probably late presentation for missed STEMI --> CATH- MV CAD;; Echo 07/2001: EF > 55%, mild MAC, mild TR, no WMA  with Post-op Setpal dyskinesis, Apex not seen)  . PAD (peripheral artery disease) ([place]) 06/08/2011   a) R Common Iliac 100%, L Common Iliac 80+% occlusion; b) L CIA - Aterectomy- 10  mm x 40 mm self-extending Stent (postdilated to 7 mm with 20-30% residual)--> Deferred R AoFem bypass;; c) LEA Dopplers 04/2012 & 04/2014 - RABI 0.8 & LABI 1.1 - STABLE, no change  . Pneumonia 1940s X 1  . Presence of drug coated stent in right coronary artery 11/24/2016   p-mRCA: Resolute Onyx DES 2.25 mm x 38 mm --> 2.5 mm x 18 mm (post-dilated from 2.7-2.8 mm)    Past Surgical History:  Procedure Laterality Date  . CARDIAC CATHETERIZATION  06/09/2011  . CORONARY ANGIOPLASTY WITH STENT PLACEMENT  11/24/2016  . CORONARY ARTERY BYPASS GRAFT  06/10/2011   oLIMA to  LAD, SVG to PDA (Dr. Servando Snare)  . CORONARY STENT INTERVENTION N/A 11/24/2016   Procedure: Coronary Stent Intervention;  Surgeon: Leonie Man, MD;  Location: [facility];  Service: Cardiovascular;  Laterality: N/A;  . ILIAC ARTERY STENT  10/27/2011   left CIA - 30mx40mm self-expanding stent, decreasing downt to 20-30% lesion, 100% occluded R CIA with extensive lumbar collateralization to R internal iliac;  05/2016: Ao-Ileac & LEA Dopplers: RABI 0.8, LABI 1.2. B TBI 0.77 -- he has known B disease s/p L CIA stent. RLABI decreased and LABI is normal; aortoiliac atherosclerosis w/ CTO of R CIA but normal REIA.  .Marland KitchenLEFT HEART CATH AND CORS/GRAFTS ANGIOGRAPHY N/A 11/24/2016   Procedure: LEFT HEART CATH AND CORS/GRAFTS ANGIOGRAPHY;  Surgeon: DLeonie Man MD;  [facility];  Service: Cardiovascular;  Laterality: N/A;  . NM MYOVIEW LTD  February 2013   LOW RISK: Fixed basal-mid inferior diaphragmatic attenuation artifact. No ischemia. EF 69%.  .Marland KitchenPERCUTANEOUS STENT INTERVENTION Left 10/27/2011   Procedure: PERCUTANEOUS STENT INTERVENTION;  Surgeon: JLorretta Harp MD;  [facility];  Service: Cardiovascular;  Laterality: Left;  . TONSILLECTOMY  ~ 1963  . TRANSTHORACIC ECHOCARDIOGRAM  08/19/2011   EF=>55%; mild mitral annular calcif; mild TR    Current Medications: Outpatient Medications Prior to Visit  Medication Sig Dispense Refill  . aspirin 81 MG chewable tablet Chew 1 tablet (81 mg total) by mouth daily.    .Marland Kitchenatorvastatin (LIPITOR) 20 MG tablet ALTERNATE 40 MG (2 TABLETS) EVERY OTHER DAY WITH 20 MG ( 1 TABLET) AT BEDTIME. 180 tablet 3  . clopidogrel (PLAVIX) 75 MG tablet Take 1 tablet (75 mg total) by mouth daily. 90 tablet 3  . fenofibrate (TRICOR) 48 MG tablet TAKE 1 TABLET BY MOUTH DAILY 90 tablet 1  . levothyroxine (SYNTHROID, LEVOTHROID) 88 MCG tablet Take 88 mcg by mouth daily before breakfast.     . metoprolol tartrate (LOPRESSOR) 25 MG tablet TAKE 1 TABLET  BY MOUTH TWICE DAILY 60 tablet 11  . nitroGLYCERIN (NITROSTAT) 0.4 MG SL tablet Place 1 tablet (0.4 mg total) under the tongue every 5 (five) minutes as needed. 25 tablet 3   No facility-administered medications prior to visit.      Allergies:   Ace inhibitors   Social History   Social History  . Marital status: Divorced    Spouse name: N/A  . Number of children: 3  . Years of education: N/A   Social History Main Topics  . Smoking status: Former Smoker    Packs/day: 3.00    Years: 50.00    Types: Cigarettes    Quit date: 06/08/2011  . Smokeless tobacco: Former [employer]   Types: Chew    Quit date: 07/13/2003  . Alcohol use 0.0 oz/week     Comment: 11/24/2016 "nothing in 2  1/2 years; never an alcoholic"  . Drug use: No  . Sexual activity: Not Asked   Other Topics Concern  . None   Social History Narrative  . None     Family History:  The patient's family history includes CAD in his sister; Diabetes in his mother; Heart disease in his mother; Heart failure in his mother; Pneumonia in his father; Stroke in his sister.   Review of Systems:   Please see the history of present illness.     General:  No chills, fever, night sweats or weight changes.  Cardiovascular:  No chest pain, edema, orthopnea, palpitations, paroxysmal nocturnal dyspnea. Positive for dyspnea on exertion.  Dermatological: No rash, lesions/masses Respiratory: No cough, dyspnea Urologic: No hematuria, dysuria Abdominal:   No nausea, vomiting, diarrhea, bright red blood per rectum, melena, or hematemesis Neurologic:  No visual changes, wkns, changes in mental status. All other systems reviewed and are otherwise negative except as noted above.   Physical Exam:    VS:  BP 137/79 (BP Location: Right Arm)   Pulse 65   Ht 5' 7"  (1.702 m)   Wt 213 lb 3.2 oz (96.7 kg)   BMI 33.39 kg/m    General: Well developed, well nourished Caucasian male appearing in no acute distress. Head: Normocephalic, atraumatic,  sclera non-icteric, no xanthomas, nares are without discharge.  Neck: No carotid bruits. JVD not elevated.  Lungs: Respirations regular and unlabored, without wheezes or rales.  Heart: Regular rate and rhythm. No S3 or S4.  No murmur, no rubs, or gallops appreciated. Abdomen: Soft, non-tender, non-distended with normoactive bowel sounds. No hepatomegaly. No rebound/guarding. No obvious abdominal masses. Msk:  Strength and tone appear normal for age. No joint deformities or effusions. Extremities: No clubbing or cyanosis. No edema.  Distal pedal pulses are 2+ bilaterally. Radial cath site with no ecchymosis or evidence of a hematoma.  Neuro: Alert and oriented X 3. Moves all extremities spontaneously. No focal deficits noted. Psych:  Responds to questions appropriately with a normal affect. Skin: No rashes or lesions noted  Wt Readings from Last 3 Encounters:  12/03/16 213 lb 3.2 oz (96.7 kg)  11/25/16 215 lb 13.3 oz (97.9 kg)  11/12/16 213 lb 3.2 oz (96.7 kg)     Studies/Labs Reviewed:   EKG:  EKG is ordered today. The ekg ordered today demonstrates NSR, HR 65, with no acute ST or T-wave changes when compared to prior tracings.   Recent Labs: 11/25/2016: Hemoglobin 10.9; Platelets 212 11/30/2016: BUN 19; Creat 1.26; Potassium 4.8; Sodium 142   Lipid Panel    Component Value Date/Time   CHOL 154 05/16/2013 1016   TRIG 305 (H) 05/16/2013 1016   HDL 32 (L) 05/16/2013 1016   CHOLHDL 4.8 05/16/2013 1016   VLDL 61 (H) 05/16/2013 1016   LDLCALC 61 05/16/2013 1016    Additional studies/ records that were reviewed today include:   Cardiac Catheterization: 03/28/1323  LV end diastolic pressure is normal. The left ventricular ejection fraction is 55-65% by visual estimate.  The left ventricular systolic function is normal.  ________________________________  Prox LAD to Mid LAD lesion, 65 %stenosed after D1. Then Mid LAD lesion, 100 %stenosed. Distal LAD fills via LIMA  LIMA-LAD and is  normal in caliber and anatomically normal.  SVG-RPDA: Origin to Prox Graft lesion, 100 %stenosed.  Prox RCA lesion, 70 %stenosed. Mid RCA-1 lesion, 99 %stenosed. Mid RCA-2 lesion, 65 %stenosed.  A STENT RESOLUTE ONYX [id number] drug eluting stent was successfully placed.  A STENT RESOLUTE ONYX 2.5X18 drug eluting stent was successfully placed, and overlaps previously placed stent.  Post intervention, there is a 0% residual stenosis.   Successful revascularization of the previously occluded RCA using 2 overlapping DES stents tapered from proximal to distal.  Otherwise stable circumflex, proximal LAD to Diag1 and patent LIMA-LAD.  Plan:  Based on his multiple comorbidities, I did not feel that he was good candidate for same-day discharge. He will therefore be monitored overnight with IV hydration.  He will restart aspirin and then change his Plavix back to daily. - Can discuss stopping aspirin at 1-3 months  Anticipate discharge tomorrow on current home medications with adjustments noted.  Assessment:    1. CAD, multiple vessel - status post CABG: LIMA-LAD, SVG-RPDA   2. S/P CABG x 2   3. Hyperlipidemia with target LDL less than 70   4. Essential hypertension   5. PAD (peripheral artery disease) - Right Common Iliac Occlusion, Left Common Iliac 80+% occlusion      Plan:   In order of problems listed above:  1. CAD - s/p CABG in 2012. Recently admitted for a cardiac catheterization in the setting of worsening dyspnea on exertion with his cath showing 100% stenosis of the SVG-RPDA with patent LIMA-LAD, 70% pRCA stenosis, 99% mRCA stenosis, and 65% mRCA-2 lesion. PCI was performed with 2 overlapping DES to the RCA.  - has continued to experience dyspnea on exertion, significantly improved with the use of his home O2. Denies any recent chest pain. EKG today is without acute ischemic changes.  - continue DAPT with ASA and Plavix. He denies any evidence of active bleeding. He is  wanting to stop ASA as soon as possible but I educated him on the importance of continuing both medications at this time with his recent stent placement. Continue with BB and statin therapy as well.   2. HTN - BP well-controlled at 137/79 during today's visit.  - continue Lopressor 30m BID.   3. HLD - Reports lipids are followed by PCP. - Continue Atorvastatin and Fenofibrate.   4. PAD - s/p L iliac stent, 100% occlusion of R iliac with collaterals present by peripheral angiography in 2013. - He denies any recent claudication symptoms. - Continue ASA, Plavix, and statin therapy.  5. COPD - has experienced worsening dyspnea on exertion for years. Now on home O2 but refuses to take this in public. Compliance with this was strongly encouraged.  - I highly recommended he establish care with a Pulmonologist but he refuses at this time.  6. Stage 3 CKD - creatinine peaked at 1.60 during recent admission, improved to 1.26 on 11/30/2016.  Medication Adjustments/Labs and Tests Ordered: Current medicines are reviewed at length with the patient today.  Concerns regarding medicines are outlined above.  Medication changes, Labs and Tests ordered today are listed in the Patient Instructions below. Patient Instructions  Medication Instructions:  Your physician recommends that you continue on your current medications as directed. Please refer to the Current Medication list given to you today.  Labwork: None ordered  Testing/Procedures: None ordered  Follow-Up: Your physician wants you to follow-up in: 3 MONTHS WITH DR. HARDING  Any Other Special Instructions Will Be Listed Below (If Applicable).   If you need a refill on your cardiac medications before your next appointment, please call your pharmacy.  Signed, BErma Heritage PA-C  12/03/2016 12:58 PM    [place]  [address] Phone: [phone]; Fax: [phone]    [address], [place], [place] [address] Phone: [phone]

## 2016-12-03 ENCOUNTER — Encounter: Payer: Self-pay | Admitting: Student

## 2016-12-03 ENCOUNTER — Ambulatory Visit (INDEPENDENT_AMBULATORY_CARE_PROVIDER_SITE_OTHER): Payer: Medicare HMO | Admitting: Student

## 2016-12-03 VITALS — BP 137/79 | HR 65 | Ht 67.0 in | Wt 213.2 lb

## 2016-12-03 DIAGNOSIS — I10 Essential (primary) hypertension: Secondary | ICD-10-CM

## 2016-12-03 DIAGNOSIS — I2510 Atherosclerotic heart disease of native coronary artery without angina pectoris: Secondary | ICD-10-CM | POA: Diagnosis not present

## 2016-12-03 DIAGNOSIS — N183 Chronic kidney disease, stage 3 unspecified: Secondary | ICD-10-CM

## 2016-12-03 DIAGNOSIS — I739 Peripheral vascular disease, unspecified: Secondary | ICD-10-CM | POA: Diagnosis not present

## 2016-12-03 DIAGNOSIS — E785 Hyperlipidemia, unspecified: Secondary | ICD-10-CM | POA: Diagnosis not present

## 2016-12-03 DIAGNOSIS — J449 Chronic obstructive pulmonary disease, unspecified: Secondary | ICD-10-CM | POA: Diagnosis not present

## 2016-12-03 NOTE — Patient Instructions (Addendum)
Medication Instructions:  Your physician recommends that you continue on your current medications as directed. Please refer to the Current Medication list given to you today.   Labwork: None ordered  Testing/Procedures: None ordered  Follow-Up: Your physician wants you to follow-up in: 3 MONTHS WITH DR. HARDING ONLY   Any Other Special Instructions Will Be Listed Below (If Applicable).   If you need a refill on your cardiac medications before your next appointment, please call your pharmacy.

## 2016-12-08 ENCOUNTER — Other Ambulatory Visit: Payer: Self-pay | Admitting: Cardiology

## 2016-12-12 ENCOUNTER — Other Ambulatory Visit: Payer: Self-pay | Admitting: Cardiology

## 2017-01-05 ENCOUNTER — Other Ambulatory Visit: Payer: Self-pay | Admitting: Cardiology

## 2017-03-08 ENCOUNTER — Other Ambulatory Visit: Payer: Self-pay | Admitting: Cardiology

## 2017-03-09 ENCOUNTER — Ambulatory Visit (INDEPENDENT_AMBULATORY_CARE_PROVIDER_SITE_OTHER): Payer: Medicare HMO | Admitting: Cardiology

## 2017-03-09 ENCOUNTER — Encounter: Payer: Self-pay | Admitting: Cardiology

## 2017-03-09 VITALS — BP 145/87 | HR 75 | Ht 67.0 in | Wt 215.0 lb

## 2017-03-09 DIAGNOSIS — I10 Essential (primary) hypertension: Secondary | ICD-10-CM | POA: Diagnosis not present

## 2017-03-09 DIAGNOSIS — Z955 Presence of coronary angioplasty implant and graft: Secondary | ICD-10-CM | POA: Diagnosis not present

## 2017-03-09 DIAGNOSIS — J9611 Chronic respiratory failure with hypoxia: Secondary | ICD-10-CM

## 2017-03-09 DIAGNOSIS — E785 Hyperlipidemia, unspecified: Secondary | ICD-10-CM | POA: Diagnosis not present

## 2017-03-09 DIAGNOSIS — I25118 Atherosclerotic heart disease of native coronary artery with other forms of angina pectoris: Secondary | ICD-10-CM

## 2017-03-09 DIAGNOSIS — I739 Peripheral vascular disease, unspecified: Secondary | ICD-10-CM | POA: Diagnosis not present

## 2017-03-09 NOTE — Progress Notes (Signed)
PCP: [name], DO  Clinic Note: Chief Complaint  Patient presents with  . Follow-up    SOB, pt wonders about stopping aspirin as it may effect his kidneys  . Coronary Artery Disease    CABG x2 -> SVG-RCA 100% --> PCI native RCA  . COPD    home O2    HPI: Michael [name] is a 71 y.o. male with a PMH below who presents today for 3 month follow-up for CAD and cardiomyopathy history of CABG x 2 & now PCI of previously CTO RCA with 100% SVG-RCA  [name] was last seen on 12/03/2016 by Bernerd Pho, PA. This was a hospital follow-up visit. He had a heart catheterization on April 3 in response to an abnormal Myoview showing inferior ischemia. As usual, he denied any chest tightness or pressure. He was on home oxygen that he did not wear to the office. He has continued to refuse pulmonology evaluation, and barely uses any of his  Recent Hospitalizations: 04/03-11/2016 for cardiac catheterization  Studies Personally Reviewed - (if available, images/films reviewed: From Epic Chart or Care Everywhere)  11/24/2016  LEFT HEART CATH AND CORS/GRAFTS ANGIOGRAPHY with Coronary Stent Intervention   Conclusion   LV end diastolic pressure is normal. The left ventricular ejection fraction is 55-65% by visual estimate.  The left ventricular systolic function is normal.  ________________________________  Prox LAD to Mid LAD lesion, 65 %stenosed after D1. Then Mid LAD lesion, 100 %stenosed. Distal LAD fills via LIMA  LIMA-LAD and is normal in caliber and anatomically normal.  SVG-RPDA: Origin to Prox Graft lesion, 100 %stenosed.  Prox RCA lesion, 70 %stenosed. Mid RCA-1 lesion, 99 %stenosed. Mid RCA-2 lesion, 65 %stenosed.  A STENT RESOLUTE ONYX 4.19Q22 drug eluting stent  & overlapping STENT RESOLUTE ONYX 2.5X18 drug eluting stent were successfully placed,   Post intervention, there is a 0% residual stenosis.   Successful revascularization of the previously occluded RCA using 2  overlapping DES stents tapered from proximal to distal.  Dx Angio       Post PCI         Interval History: [name] returns today really still not noticing any chest tightness or pressure. He claims he doesn't feel any better since his stent, however he does appear to be much less dyspneic at baseline. He still takes deep breaths and was quite short of breath walking into the office as usual, he is not using a formal oxygen. He does indicate that he uses it went home, and when sleeping. He just says that the oxygen does help him and he is now agreed to use it. He never has noted any chest pain and continues not have any. He still has some bruising with the aspirin and is very concerned about aspirin in the affected have his kidneys in the past. Although he has baseline dyspnea, he doesn't really have any significant orthopnea or PND. He denies any rapid or irregular heartbeats or palpitations. No syncope/near-syncope or TIA/amaurosis fugax. He is tolerating Lipitor and fenofibrate without major problems.  No melena, hematochezia, hematuria, or epstaxis. He still has stable right-sided buttock and calf claudication.  ROS: A comprehensive was performed. Review of Systems  Constitutional: Negative for malaise/fatigue (Mildly distended having energy because of his dyspnea).  HENT: Negative for hearing loss and nosebleeds.   Respiratory: Positive for cough, shortness of breath and wheezing. Negative for hemoptysis.   Cardiovascular:       Per history of present illness  Gastrointestinal: Positive for  heartburn. Negative for constipation, nausea and vomiting.  Genitourinary: Negative for dysuria.  Musculoskeletal: Positive for joint pain (Hip pain). Negative for myalgias.  Neurological: Positive for dizziness (Only if he is hypoxic with, for instance walking into the office after thefire alarm, he regretted not having his home oxygen.). Negative for weakness.  Psychiatric/Behavioral: Negative for  depression and memory loss. The patient is not nervous/anxious and does not have insomnia.    I have reviewed and (if needed) personally updated the patient's problem list, medications, allergies, past medical and surgical history, social and family history.   Past Medical History:  Diagnosis Date  . Arthritis    "right hip; right wrist" (11/24/2016)  . CAD, multiple vessel 05/2011   90% mid LAD with diffuse proximal stenosis, 100% proximal RCA --> s/p CABG x 2 (LIMA-LAD, SVG-dRCA); b) Lexiscan Myoview 09/2016 w/ Inferior-Inferolateral defect -> Cath:  11/24/16: 100% CTO of SVG-RCA (but nagtive /RCA only subtotal occlusion) --> PCI with DES x2 (resolute onyx DES 2.25 mm x 30 mm, 2.5 mm x 20 mm) RCA  . Carotid artery occlusion   . COPD (chronic obstructive pulmonary disease) with emphysema ([place])   . Essential hypertension   . History of gout   . History of tobacco abuse    quit 05/2011  . Hyperlipidemia with target LDL less than 70   . Hypothyroidism   . MI (myocardial infarction) ([place]) 05/2011   "I had 3 in 2 days"  . NSTEMI (non-ST elevated myocardial infarction) ([place]) 06/05/2011   With CHF - probably late presentation for missed STEMI --> CATH- MV CAD;; Echo 07/2001: EF > 55%, mild MAC, mild TR, no WMA  with Post-op Setpal dyskinesis, [place] not seen)  . PAD (peripheral artery disease) ([place]) 06/08/2011   a) R Common Iliac 100%, L Common Iliac 80+% occlusion; b) L CIA - Aterectomy- 10 mm x 40 mm self-extending Stent (postdilated to 7 mm with 20-30% residual)--> Deferred R AoFem bypass;; c) LEA Dopplers 04/2012 & 04/2014 - RABI 0.8 & LABI 1.1 - STABLE, no change  . Pneumonia 1940s X 1  . Presence of drug coated stent in right coronary artery 11/24/2016   p-mRCA: Resolute Onyx DES 2.25 mm x 38 mm --> 2.5 mm x 18 mm (post-dilated from 2.7-2.8 mm)    Past Surgical History:  Procedure Laterality Date  . CARDIAC CATHETERIZATION  06/09/2011  . CORONARY ANGIOPLASTY WITH STENT PLACEMENT  11/24/2016    with 100% SVG-RCA & flow in native RCA now present --> PCI-p-m RCA 2 Overlapping Resolute DES (2.25 x 38, 1.5 x 18)  . CORONARY ARTERY BYPASS GRAFT  06/10/2011   oLIMA to LAD, SVG to PDA (Dr. Servando Snare)  . CORONARY STENT INTERVENTION N/A 11/24/2016   Procedure: Coronary Stent Intervention;  Surgeon: Leonie Man, MD;  Location: [facility];  Service: Cardiovascular;  Laterality: N/A;  . ILIAC ARTERY STENT  10/27/2011   left CIA - 46mx40mm self-expanding stent, decreasing downt to 20-30% lesion, 100% occluded R CIA with extensive lumbar collateralization to R internal iliac;  05/2016: Ao-Ileac & LEA Dopplers: RABI 0.8, LABI 1.2. B TBI 0.77 -- he has known B disease s/p L CIA stent. RLABI decreased and LABI is normal; aortoiliac atherosclerosis w/ CTO of R CIA but normal REIA.  .Marland KitchenLEFT HEART CATH AND CORS/GRAFTS ANGIOGRAPHY N/A 11/24/2016   Procedure: LEFT HEART CATH AND CORS/GRAFTS ANGIOGRAPHY;  Surgeon: DLeonie Man MD;  [facility]: 100% SVG-RCA, but now native RCA only  sub-CTO with antegrade flow.  85% mLAD after D1. 40% oOM1 --> PCI of Native RCA  . NM MYOVIEW LTD  09/2016    EF 62%. Medium sized, moderate severity defect in the basal inferior, mid inferoseptal and mid inferior walls is partially reversible. - INTERMEDIATE RISK -- CATH  . PERCUTANEOUS STENT INTERVENTION Left 10/27/2011   Procedure: PERCUTANEOUS STENT INTERVENTION;  Surgeon: Lorretta Harp, MD;  Location: [facility] CATH [facility];  Service: Cardiovascular;  Laterality: Left;  . TONSILLECTOMY  ~ 1963  . TRANSTHORACIC ECHOCARDIOGRAM  08/19/2011   EF=>55%; mild mitral annular calcif; mild TR    Current Meds  Medication Sig  . aspirin 81 MG chewable tablet Chew 1 tablet (81 mg total) by mouth daily.  Marland Kitchen atorvastatin (LIPITOR) 20 MG tablet ALTERNATE 40 MG (2 TABLETS) EVERY OTHER DAY WITH 20 MG ( 1 TABLET) AT BEDTIME.  . clopidogrel (PLAVIX) 75 MG tablet Take 1 tablet (75 mg total) by mouth daily.  Marland Kitchen levothyroxine  (SYNTHROID, LEVOTHROID) 88 MCG tablet Take 88 mcg by mouth daily before breakfast.   . losartan (COZAAR) 25 MG tablet TAKE 1 TABLET BY MOUTH ONCE DAILY  . metoprolol tartrate (LOPRESSOR) 25 MG tablet TAKE 1 TABLET BY MOUTH TWICE DAILY  . nitroGLYCERIN (NITROSTAT) 0.4 MG SL tablet Place 1 tablet (0.4 mg total) under the tongue every 5 (five) minutes as needed.  . [DISCONTINUED] fenofibrate (TRICOR) 48 MG tablet TAKE 1 TABLET BY MOUTH DAILY    Allergies  Allergen Reactions  . Ace Inhibitors Cough    Social History   Social History  . Marital status: Divorced    Spouse name: N/A  . Number of children: 3  . Years of education: N/A   Social History Main Topics  . Smoking status: Former Smoker    Packs/day: 3.00    Years: 50.00    Types: Cigarettes    Quit date: 06/08/2011  . Smokeless tobacco: Former Systems developer    Types: Chew    Quit date: 07/13/2003  . Alcohol use 0.0 oz/week     Comment: 11/24/2016 "nothing in 2 1/2 years; never an alcoholic"  . Drug use: No  . Sexual activity: Not Asked   Other Topics Concern  . None   Social History Narrative  . None    family history includes CAD in his sister; Diabetes in his mother; Heart disease in his mother; Heart failure in his mother; Pneumonia in his father; Stroke in his sister.  Wt Readings from Last 3 Encounters:  03/09/17 215 lb (97.5 kg)  12/03/16 213 lb 3.2 oz (96.7 kg)  11/25/16 215 lb 13.3 oz (97.9 kg)    PHYSICAL EXAM BP (!) 145/87   Pulse 75   Ht _0  (1.702 m)   Wt 215 lb (97.5 kg)   SpO2 94%   BMI 33.67 kg/m  General appearance: alert, cooperative, appears stated age, no distress. Mildly obese - truncal. Otherwise well-groomed. HEENT: [place]/AT, EOMI, MMM, anicteric sclera Neck: no adenopathy, soft right carotid bruit and no JVD Lungs: Based on accessory muscle use. But nonlabored. Increased AP diameter. He does have diffuse interstitial sounds but no true W/R/R. Diminished breath sounds throughout. Heart: regular  rate and rhythm, S1 &S2 normal, no murmur, click, rub or gallop; normal PMI Abdomen: soft, non-tender; bowel sounds normal; no masses,  no organomegaly; no HJR Extremities: extremities normal, atraumatic, no cyanosis, or edema  Pulses: 2+ and symmetric radial and left leg pulses, but 1+ lower extremity distal pulses;  Skin: mobility  and turgor normal and no evidence of bleeding or bruising  Neurologic: Mental status: Alert & oriented x 3, thought content appropriate; non-focal exam.  Pleasant mood & affect.    Adult ECG Report n/a  Other studies Reviewed: Additional studies/ records that were reviewed today include:  Recent Labs:  Followed by PCP (hopes to change to a PCP in [place] closer to home)   ASSESSMENT / PLAN: Problem List Items Addressed This Visit    CAD, multiple vessel - status post CABG: LIMA-LAD, SVG-RPDA; PCI native RCA for 100% SVG-RCA - Primary (Chronic)    Again, he never really had angina, was more related to heart failure symptoms and dyspnea. His dyspnea doesn't seem to change probably more related to COPD now. That more mobile now having had his PCI although he doesn't truly much change in symptoms. He had been on Plavix every other day before because of GI issues and ears for renal problems and GI problems. I think we can probably have him come off of aspirin after the next month or so. I will have him take it every other day and then stop it at the end of the month and discontinue with Plavix alone. He is on low-dose ARB and Toprol. Tolerating relatively well.      Chronic respiratory failure with hypoxia (HCC) - SaO2 drops to 87% with ambulation (Chronic)    Finally, we have him on home oxygen. He is using at home, and notes it helps, but he is just refusing to take a portable tank. I think he would get it today because he had to do a lot of walking during the fire drill. I encouraged him to take his tank along when he goes out.  I will defer management  to his PCP, he has refused going to lots, and never tries any pulmonary medicines. He stated really get much benefit from all the extensive inhalers etc.      Essential hypertension (Chronic)    Actually low but high today, not usually an issue for him. Will simply continue current dose of ARB and beta blocker.      Hyperlipidemia with target LDL less than 70 (Chronic)    I have not seen a long time. Monitored by PCP on atorvastatin. He is now alternating 20 mg and 40 mg every other day.      PAD (peripheral artery disease) - Right Common Iliac Occlusion, Left Common Iliac 80+% occlusion (Chronic)    Stable Dopplers. I will see him back after the next set. Continue Plavix, statin and beta blocker/ARB.      Relevant Orders   VAS [place] LOWER EXTREMITY ARTERIAL DUPLEX   Presence of drug coated stent in right coronary artery (Chronic)      Current medicines are reviewed at length with the patient today. (+/- concerns) n/a The following changes have been made: see below  Patient Instructions  Your physician has recommended you make the following change in your medication:  - HOLD aspirin for 1 week  - Then resume it every other day for 2 months    - Then STOP  Your physician recommends that you schedule a follow-up appointment in: October 2018 with Dr. Ellyn Hack  Your physician has requested that you have a lower extremity arterial duplex prior to October 2018 visit with Dr. Ellyn Hack. This test is an ultrasound of the arteries in the legs. It looks at arterial blood flow in the legs. Allow one hour for Lower Arterial scans.  There are no restrictions or special instructions     Studies Ordered:   No orders of the defined types were placed in this encounter.     Glenetta Hew, M.D., M.S. Interventional Cardiologist   Pager # [phone] Phone # [phone] [address]. [place], [place] [address]

## 2017-03-09 NOTE — Patient Instructions (Signed)
Your physician has recommended you make the following change in your medication:  - HOLD aspirin for 1 week  - Then resume it every other day for 2 months    - Then STOP  Your physician recommends that you schedule a follow-up appointment in: October 2018 with Dr. Herbie BaltimoreHarding  Your physician has requested that you have a lower extremity arterial duplex prior to October 2018 visit with Dr. Herbie BaltimoreHarding. This test is an ultrasound of the arteries in the legs. It looks at arterial blood flow in the legs. Allow one hour for Lower Arterial scans. There are no restrictions or special instructions

## 2017-03-09 NOTE — Telephone Encounter (Signed)
Rx has been sent to the pharmacy electronically. ° °

## 2017-03-11 ENCOUNTER — Encounter: Payer: Self-pay | Admitting: Cardiology

## 2017-03-11 NOTE — Assessment & Plan Note (Addendum)
Finally, we have him on home oxygen. He is using at home, and notes it helps, but he is just refusing to take a portable tank. I think he would get it today because he had to do a lot of walking during the fire drill. I encouraged him to take his tank along when he goes out.  I will defer management to his PCP, he has refused going to lots, and never tries any pulmonary medicines. He stated really get much benefit from all the extensive inhalers etc.

## 2017-03-11 NOTE — Assessment & Plan Note (Addendum)
Again, he never really had angina, was more related to heart failure symptoms and dyspnea. His dyspnea doesn't seem to change probably more related to COPD now. That more mobile now having had his PCI although he doesn't truly much change in symptoms. He had been on Plavix every other day before because of GI issues and ears for renal problems and GI problems. I think we can probably have him come off of aspirin after the next month or so. I will have him take it every other day and then stop it at the end of the month and discontinue with Plavix alone. He is on low-dose ARB and Toprol. Tolerating relatively well.

## 2017-03-11 NOTE — Assessment & Plan Note (Signed)
Stable Dopplers. I will see him back after the next set. Continue Plavix, statin and beta blocker/ARB.

## 2017-03-11 NOTE — Assessment & Plan Note (Signed)
Actually low but high today, not usually an issue for him. Will simply continue current dose of ARB and beta blocker.

## 2017-03-11 NOTE — Assessment & Plan Note (Signed)
I have not seen a long time. Monitored by PCP on atorvastatin. He is now alternating 20 mg and 40 mg every other day.

## 2017-05-24 ENCOUNTER — Encounter: Payer: Self-pay | Admitting: Family Medicine

## 2017-05-24 ENCOUNTER — Ambulatory Visit (INDEPENDENT_AMBULATORY_CARE_PROVIDER_SITE_OTHER): Payer: Medicare HMO | Admitting: Family Medicine

## 2017-05-24 VITALS — BP 146/82 | HR 68 | Ht 67.0 in | Wt 211.9 lb

## 2017-05-24 DIAGNOSIS — I2510 Atherosclerotic heart disease of native coronary artery without angina pectoris: Secondary | ICD-10-CM

## 2017-05-24 DIAGNOSIS — I10 Essential (primary) hypertension: Secondary | ICD-10-CM

## 2017-05-24 DIAGNOSIS — Z5320 Procedure and treatment not carried out because of patient's decision for unspecified reasons: Secondary | ICD-10-CM | POA: Insufficient documentation

## 2017-05-24 DIAGNOSIS — I70213 Atherosclerosis of native arteries of extremities with intermittent claudication, bilateral legs: Secondary | ICD-10-CM | POA: Diagnosis not present

## 2017-05-24 DIAGNOSIS — E039 Hypothyroidism, unspecified: Secondary | ICD-10-CM | POA: Diagnosis not present

## 2017-05-24 DIAGNOSIS — E785 Hyperlipidemia, unspecified: Secondary | ICD-10-CM

## 2017-05-24 DIAGNOSIS — I25118 Atherosclerotic heart disease of native coronary artery with other forms of angina pectoris: Secondary | ICD-10-CM | POA: Diagnosis not present

## 2017-05-24 DIAGNOSIS — Z87891 Personal history of nicotine dependence: Secondary | ICD-10-CM | POA: Diagnosis not present

## 2017-05-24 DIAGNOSIS — Z9119 Patient's noncompliance with other medical treatment and regimen: Secondary | ICD-10-CM | POA: Diagnosis not present

## 2017-05-24 DIAGNOSIS — J439 Emphysema, unspecified: Secondary | ICD-10-CM | POA: Diagnosis not present

## 2017-05-24 DIAGNOSIS — I779 Disorder of arteries and arterioles, unspecified: Secondary | ICD-10-CM | POA: Diagnosis not present

## 2017-05-24 DIAGNOSIS — Z91199 Patient's noncompliance with other medical treatment and regimen due to unspecified reason: Secondary | ICD-10-CM | POA: Insufficient documentation

## 2017-05-24 MED ORDER — LEVOTHYROXINE SODIUM 88 MCG PO TABS: 88.0000 ug | ORAL_TABLET | Freq: Every day | ORAL | 1 refills | Status: DC

## 2017-05-24 NOTE — Assessment & Plan Note (Signed)
Patient follows with cardiology regularly

## 2017-05-24 NOTE — Assessment & Plan Note (Signed)
Patient declines evaluation of his COPD with PFTs etc.    Patient declines COPD medicines  - Patient is on supplemental oxygen per cardiology.  Continue treatment per cardiology

## 2017-05-24 NOTE — Assessment & Plan Note (Signed)
Continue to be followed by cardiology.

## 2017-05-24 NOTE — Patient Instructions (Signed)

## 2017-05-24 NOTE — Assessment & Plan Note (Addendum)
On 3\26\18 patients on his prior PCP for a complete physical and labs.  T3 uptake was 43.8 and T4 total was 9.6.  They continued hot him on the 88 g tablet of Synthroid.    He needs refills.  No new concerns or complaints

## 2017-05-24 NOTE — Assessment & Plan Note (Signed)
Patient understands risks and only agrees to doing Hemoccult card test yearly

## 2017-05-24 NOTE — Progress Notes (Signed)
New patient office visit note:  Impression and Recommendations:    1. Pulmonary emphysema, unspecified emphysema type ([place])   2. History of tobacco abuse- 2+ppd for 50+ yrs- quit in Jun 08 2011   3. Acquired hypothyroidism   4. Personal history of noncompliance with medical treatment   5. Noncompliance with immunization regimen   6. Colonoscopy refused   7. CAD, multiple vessel - status post CABG: LIMA-LAD, SVG-RPDA; PCI native RCA for 100% SVG-RCA   8. Atherosclerosis of native artery of both lower extremities with intermittent claudication ([place])   9. Atherosclerosis of coronary artery of native heart without angina pectoris, unspecified vessel or lesion type   10. Peripheral arterial occlusive disease ([place])   11. Essential hypertension   12. Hyperlipidemia with target LDL less than 70      Hypothyroidism On 3\26\18 patients on his prior PCP for a complete physical and labs.  T3 uptake was 43.8 and T4 total was 9.6.  They continued hot him on the 88 g tablet of Synthroid.    He needs refills.  No new concerns or complaints  Peripheral arterial occlusive disease ([place]) Continue to be followed by cardiology.  Atherosclerosis of native artery of extremity ([employer]) Continue assessment and treatment per cardiology  Colonoscopy refused Patient understands risks and only agrees to doing Hemoccult card test yearly  COPD (chronic obstructive pulmonary disease) ([place]) Patient declines evaluation of his COPD with PFTs etc.    Patient declines COPD medicines  - Patient is on supplemental oxygen per cardiology.  Continue treatment per cardiology  Essential hypertension Blood pressure treatment / medications per cardiology. - BP stable, patient declines symptoms today.  History of tobacco abuse- 2+ppd for 50+ yrs- quit in Jun 08 2011 Patient understands his significant risk associated with lung cancer, peripheral arterial disease, COPD etc.  - Importance of yearly physical exam  and screening test discussed with patient.  Recommend in addition to seeing me for chronic follow-up, he get a yearly examination in addition to Medicare wellness  Hyperlipidemia with target LDL less than 70 Treatment per cardiology.   - When patient comes in fasting for next office visit in 4-6 months for CPE, we will obtain fasting lipid profile if not already done as this can be shared with cardiology   CAD, multiple vessel - status post CABG: LIMA-LAD, SVG-RPDA; PCI native RCA for 100% SVG-RCA Patient follows with cardiology regularly   The patient was counseled, risk factors were discussed, anticipatory guidance given.   New Prescriptions   No medications on file    Meds ordered this encounter  Medications  . levothyroxine (SYNTHROID, LEVOTHROID) 88 MCG tablet    Sig: Take 1 tablet (88 mcg total) by mouth daily before breakfast.    Dispense:  90 tablet    Refill:  1    Discontinued Medications   LOSARTAN (COZAAR) 25 MG TABLET    TAKE 1 TABLET BY MOUTH ONCE DAILY    Modified Medications   Modified Medication Previous Medication   LEVOTHYROXINE (SYNTHROID, LEVOTHROID) 88 MCG TABLET levothyroxine (SYNTHROID, LEVOTHROID) 88 MCG tablet      Take 1 tablet (88 mcg total) by mouth daily before breakfast.    Take 88 mcg by mouth daily before breakfast.     No orders of the defined types were placed in this encounter.    Gross side effects, risk and benefits, and alternatives of medications discussed with patient.  Patient is aware that all medications have potential side effects  and we are unable to predict every side effect or drug-drug interaction that may occur.  Expresses verbal understanding and consents to current therapy plan and treatment regimen.  Return for CPE/ yrly physical, come fasting 42mo f/up Cardiologist sooner as planned.  Please see AVS handed out to patient at the end of our visit for further patient instructions/ counseling done pertaining to today's  office visit.    Note: This document was prepared using Dragon voice recognition software and may include unintentional dictation errors.  ----------------------------------------------------------------------------------------------------------------------    Subjective:    Chief complaint:   Chief Complaint  Patient presents with  . Establish Care     HPI: Michael [name] a pleasant 71y.o. male [facility] review their medical history with me and establish care.   I asked the patient to review their chronic problem list with me to ensure everything was updated and accurate.    All recent office visits with other providers, any medical records that patient brought in etc  - I reviewed today.     Also asked pt to get me medical records [facility]/ specialists that they had seen within the past 3-5 years- if they are in private practice and/or do not work for a [facility] [employer] practice.  Told them to call their specialists to clarify this if they are not sure.   [place] [address] [place]    Per pt, his PCp who he last seen in June quit.   Pt is single for past 21 yrs and claims he " is difficult to get along with" and will be "a difficult pt".   - Pt quite a loner. Has daughter in area- rarely sees her- only once a month.  Only gets long with neighbors b/c "he doesn't speak with them."   Reason he is over here is to get his thyroid medicine- he sees his cardiologist who gives him all his other medicines.  He will run out in the next couple of weeks.   Problem  History of tobacco abuse- 2+ppd for 50+ yrs- quit in Jun 08 2011  Hyperlipidemia With Target Ldl Less Than 70  Essential Hypertension  Hypothyroidism   Pt has had thyroid issues for 20 + yrs now.  Found on labs.   Last yr- they had a diff time regulating it.  Went from 100 to 88 to 75  mcg, back to 88.   Last time he had this checked was in April.  Then it was normal.  He's been on the same dose since then which is 88 g daily.   Copd (Chronic Obstructive Pulmonary Disease) (Hcc)   pfts done 06/09/2011  This past April 2018 pt went on home O2- 2L continuous, per his cardiologist.    - Pt declines seeing a Pulmonary doctor or going for PFTs.  He tells me that the breathing medicine and inhalers are "junk" and he will not do it.  He admits that his breathing is bad but yet declines further evaluation or to take medicines.  He tells me his prior PCP also tried to put him on COPD medicines\inhalers the patient said they did not help after several days of taking them.  He declines evaluation or treatment again today   Chronic respiratory failure with hypoxia (HCC) - SaO2 drops to 87% with ambulation  Atherosclerosis of Native Artery  of Extremity (Hcc)   Overview:  Last Assessment & Plan:  His claudication is not limiting. In his right iliac is not very amenable to treatment percutaneously. The left-sided iliac was extremely calcified and despite rotational atherectomy wasn't very difficult reduced to below 30% stenosis.  Gets yrly testing through [facility]   Peripheral Arterial Occlusive Disease (Hcc)   Overview:  Last Assessment & Plan:  No claudication despite reduced ABIs. Stable overall findings. Follow up annually. Since he is limited more by exertional dyspnea than claudication, I agree with the concept of expected management and not proceeding with femoral bypass. Recommended continued walking. Other cardiovascular risk modification as the   CAD, multiple vessel - status post CABG: LIMA-LAD, SVG-RPDA; PCI native RCA for 100% SVG-RCA   H/o 4-5 AMI's in his life.  Oct 13th, 2012 was first AMI.  S/p bypass and mulitple stents- couple in heart and one in L leg.   Cardiologist- Ellyn Hack- [facility].    Colonoscopy Refused  PAD (peripheral artery disease) - Right Common Iliac  Occlusion, Left Common Iliac 80+% occlusion  Cad (Coronary Atherosclerotic Disease)  Noncompliance With Immunization Regimen  Personal History of Noncompliance With Medical Treatment      Wt Readings from Last 3 Encounters:  05/24/17 211 lb 14.4 oz (96.1 kg)  03/09/17 215 lb (97.5 kg)  12/03/16 213 lb 3.2 oz (96.7 kg)   BP Readings from Last 3 Encounters:  05/24/17 (!) 146/82  03/09/17 (!) 145/87  12/03/16 137/79   Pulse Readings from Last 3 Encounters:  05/24/17 68  03/09/17 75  12/03/16 65   BMI Readings from Last 3 Encounters:  05/24/17 33.19 kg/m  03/09/17 33.67 kg/m  12/03/16 33.39 kg/m    Patient Care Team    Relationship Specialty Notifications Start End  Mellody Dance, DO PCP - General Family Medicine  05/24/17   Leonie Man, MD Consulting Physician Cardiology  05/24/17     Patient Active Problem List   Diagnosis Date Noted  . History of tobacco abuse- 2+ppd for 50+ yrs- quit in Jun 08 2011 05/24/2017    Priority: High  . Hyperlipidemia with target LDL less than 70 05/21/2013    Priority: High  . Essential hypertension     Priority: High    Class: Diagnosis of  . Hypothyroidism     Priority: High    Class: Diagnosis of  . COPD (chronic obstructive pulmonary disease) ([place]) 06/10/2011    Priority: High    Class: Diagnosis of  . Chronic respiratory failure with hypoxia (HCC) - SaO2 drops to 87% with ambulation 06/18/2015    Priority: Medium  . Atherosclerosis of native artery of extremity ([place]) 12/10/2011    Priority: Medium  . Peripheral arterial occlusive disease ([place]) 10/22/2011    Priority: Medium  . CAD, multiple vessel - status post CABG: LIMA-LAD, SVG-RPDA; PCI native RCA for 100% SVG-RCA 06/08/2011    Priority: Medium    Class: History of  . Colonoscopy refused 05/24/2017    Priority: Low  . PAD (peripheral artery disease) - Right Common Iliac Occlusion, Left Common Iliac 80+% occlusion 10/22/2011    Priority: Low    Class:  Diagnosis of  . CAD (coronary atherosclerotic disease) 05/24/2017  . Noncompliance with immunization regimen 05/24/2017  . Personal history of noncompliance with medical treatment 05/24/2017  . Presence of drug coated stent in right coronary artery 11/24/2016  . Abnormal nuclear stress test 11/12/2016  . HOH (hard of hearing) 11/25/2015  . Encounter for long-term (current)  use of other medications 05/21/2013  . Atherosclerosis 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 12/10/2011  . S/P CABG x 2 06/10/2011    Class: History of  . Multiple vessel coronary artery disease 06/08/2011     Past Medical History:  Diagnosis Date  . Arthritis    "right hip; right wrist" (11/24/2016)  . CAD, multiple vessel 05/2011   90% mid LAD with diffuse proximal stenosis, 100% proximal RCA --> s/p CABG x 2 (LIMA-LAD, SVG-dRCA); b) Lexiscan Myoview 09/2016 w/ Inferior-Inferolateral defect -> Cath:  11/24/16: 100% CTO of SVG-RCA (but nagtive /RCA only subtotal occlusion) --> PCI with DES x2 (resolute onyx DES 2.25 mm x 30 mm, 2.5 mm x 20 mm) RCA  . Carotid artery occlusion   . COPD (chronic obstructive pulmonary disease) with emphysema ([place])   . Essential hypertension   . History of gout   . History of tobacco abuse    quit 05/2011  . Hyperlipidemia with target LDL less than 70   . Hypothyroidism   . MI (myocardial infarction) ([place]) 05/2011   "I had 3 in 2 days"  . NSTEMI (non-ST elevated myocardial infarction) ([place]) 06/05/2011   With CHF - probably late presentation for missed STEMI --> CATH- MV CAD;; Echo 07/2001: EF > 55%, mild MAC, mild TR, no WMA  with Post-op Setpal dyskinesis, Apex not seen)  . PAD (peripheral artery disease) ([place]) 06/08/2011   a) R Common Iliac 100%, L Common Iliac 80+% occlusion; b) L CIA - Aterectomy- 10 mm x 40 mm self-extending Stent (postdilated to 7 mm with 20-30% residual)--> Deferred R AoFem bypass;; c) LEA Dopplers 04/2012 & 04/2014 - RABI 0.8 & LABI 1.1  - STABLE, no change  . Pneumonia 1940s X 1  . Presence of drug coated stent in right coronary artery 11/24/2016   p-mRCA: Resolute Onyx DES 2.25 mm x 38 mm --> 2.5 mm x 18 mm (post-dilated from 2.7-2.8 mm)     Past Medical History:  Diagnosis Date  . Arthritis    "right hip; right wrist" (11/24/2016)  . CAD, multiple vessel 05/2011   90% mid LAD with diffuse proximal stenosis, 100% proximal RCA --> s/p CABG x 2 (LIMA-LAD, SVG-dRCA); b) Lexiscan Myoview 09/2016 w/ Inferior-Inferolateral defect -> Cath:  11/24/16: 100% CTO of SVG-RCA (but nagtive /RCA only subtotal occlusion) --> PCI with DES x2 (resolute onyx DES 2.25 mm x 30 mm, 2.5 mm x 20 mm) RCA  . Carotid artery occlusion   . COPD (chronic obstructive pulmonary disease) with emphysema ([place])   . Essential hypertension   . History of gout   . History of tobacco abuse    quit 05/2011  . Hyperlipidemia with target LDL less than 70   . Hypothyroidism   . MI (myocardial infarction) ([place]) 05/2011   "I had 3 in 2 days"  . NSTEMI (non-ST elevated myocardial infarction) ([place]) 06/05/2011   With CHF - probably late presentation for missed STEMI --> CATH- MV CAD;; Echo 07/2001: EF > 55%, mild MAC, mild TR, no WMA  with Post-op Setpal dyskinesis, Apex not seen)  . PAD (peripheral artery disease) ([place]) 06/08/2011   a) R Common Iliac 100%, L Common Iliac 80+% occlusion; b) L CIA - Aterectomy- 10 mm x 40 mm self-extending Stent (postdilated to 7 mm with 20-30% residual)--> Deferred R AoFem bypass;; c) LEA Dopplers 04/2012 & 04/2014 - RABI 0.8 & LABI 1.1 - STABLE, no change  . Pneumonia 1940s X 1  . Presence  of drug coated stent in right coronary artery 11/24/2016   p-mRCA: Resolute Onyx DES 2.25 mm x 38 mm --> 2.5 mm x 18 mm (post-dilated from 2.7-2.8 mm)     Past Surgical History:  Procedure Laterality Date  . CARDIAC CATHETERIZATION  06/09/2011  . CORONARY ANGIOPLASTY WITH STENT PLACEMENT  11/24/2016   with 100% SVG-RCA & flow in native RCA now  present --> PCI-p-m RCA 2 Overlapping Resolute DES (2.25 x 38, 1.5 x 18)  . CORONARY ARTERY BYPASS GRAFT  06/10/2011   oLIMA to LAD, SVG to PDA (Dr. Servando Snare)  . CORONARY STENT INTERVENTION N/A 11/24/2016   Procedure: Coronary Stent Intervention;  Surgeon: Leonie Man, MD;  Location: [facility];  Service: Cardiovascular;  Laterality: N/A;  . ILIAC ARTERY STENT  10/27/2011   left CIA - 29mx40mm self-expanding stent, decreasing downt to 20-30% lesion, 100% occluded R CIA with extensive lumbar collateralization to R internal iliac;  05/2016: Ao-Ileac & LEA Dopplers: RABI 0.8, LABI 1.2. B TBI 0.77 -- he has known B disease s/p L CIA stent. RLABI decreased and LABI is normal; aortoiliac atherosclerosis w/ CTO of R CIA but normal REIA.  .Marland KitchenLEFT HEART CATH AND CORS/GRAFTS ANGIOGRAPHY N/A 11/24/2016   Procedure: LEFT HEART CATH AND CORS/GRAFTS ANGIOGRAPHY;  Surgeon: DLeonie Man MD;  [facility]: 100% SVG-RCA, but now native RCA only sub-CTO with antegrade flow.  85% mLAD after D1. 40% oOM1 --> PCI of Native RCA  . NM MYOVIEW LTD  09/2016    EF 62%. Medium sized, moderate severity defect in the basal inferior, mid inferoseptal and mid inferior walls is partially reversible. - INTERMEDIATE RISK -- CATH  . PERCUTANEOUS STENT INTERVENTION Left 10/27/2011   Procedure: PERCUTANEOUS STENT INTERVENTION;  Surgeon: JLorretta Harp MD;  [facility];  Service: Cardiovascular;  Laterality: Left;  . TONSILLECTOMY  ~ 1963  . TRANSTHORACIC ECHOCARDIOGRAM  08/19/2011   EF=>55%; mild mitral annular calcif; mild TR     Family History  Problem Relation Age of Onset  . Heart disease Mother        valve replacement  . Diabetes Mother   . Heart failure Mother   . CAD Sister        cardiac stent  . Stroke Sister        stents in neck  . Pneumonia Father      History  Drug Use No     History  Alcohol Use No    Comment: 11/24/2016 "nothing in 2 1/2 years; never an  alcoholic"     History  Smoking Status  . Former Smoker  . Packs/day: 3.00  . Years: 50.00  . Types: Cigarettes  . Quit date: 06/08/2011  Smokeless Tobacco  . Former [employer] developer . Types: Chew  . Quit date: 07/13/2003     Outpatient Encounter Prescriptions as of 05/24/2017  Medication Sig Note  . atorvastatin (LIPITOR) 20 MG tablet ALTERNATE 40 MG (2 TABLETS) EVERY OTHER DAY WITH 20 MG ( 1 TABLET) AT BEDTIME.   . clopidogrel (PLAVIX) 75 MG tablet Take 1 tablet (75 mg total) by mouth daily.   . fenofibrate (TRICOR) 48 MG tablet TAKE 1 TABLET BY MOUTH DAILY   . levothyroxine (SYNTHROID, LEVOTHROID) 88 MCG tablet Take 1 tablet (88 mcg total) by mouth daily before breakfast.   . metoprolol tartrate (LOPRESSOR) 25 MG tablet TAKE 1 TABLET BY MOUTH TWICE DAILY   . nitroGLYCERIN (NITROSTAT) 0.4 MG SL  tablet Place 1 tablet (0.4 mg total) under the tongue every 5 (five) minutes as needed.   . [DISCONTINUED] levothyroxine (SYNTHROID, LEVOTHROID) 88 MCG tablet Take 88 mcg by mouth daily before breakfast.    . aspirin 81 MG chewable tablet Chew 1 tablet (81 mg total) by mouth daily. (Patient not taking: Reported on 05/24/2017) 05/24/2017: Patient stopped 1 day ago   . [DISCONTINUED] losartan (COZAAR) 25 MG tablet TAKE 1 TABLET BY MOUTH ONCE DAILY    No facility-administered encounter medications on file as of 05/24/2017.     Allergies: Ace inhibitors   ROS   Objective:   Blood pressure (!) 146/82, pulse 68, height 5' 7"  (1.702 m), weight 211 lb 14.4 oz (96.1 kg), SpO2 95 %. Body mass index is 33.19 kg/m. General: Well Developed, well nourished, and in no acute distress.  Neuro: Alert and oriented x3, extra-ocular muscles intact, sensation grossly intact.  HEENT:[place]/AT, PERRLA, neck supple, No carotid bruits Skin: no gross rashes  Cardiac: Regular rate and rhythm Respiratory: Essentially clear to auscultation bilaterally. Not using accessory muscles, speaking in full sentences.  Abdominal:  not grossly distended Musculoskeletal: Ambulates w/o diff, FROM * 4 ext.  Vasc: less 2 sec cap RF, warm and pink  Psych:  No HI/SI, judgement and insight good, Euthymic mood. Full Affect.    No results found for this or any previous visit (from the past 2160 hour(s)).

## 2017-05-24 NOTE — Assessment & Plan Note (Signed)
Continue assessment and treatment per cardiology

## 2017-05-24 NOTE — Assessment & Plan Note (Signed)
Patient understands his significant risk associated with lung cancer, peripheral arterial disease, COPD etc.  - Importance of yearly physical exam and screening test discussed with patient.  Recommend in addition to seeing me for chronic follow-up, he get a yearly examination in addition to Medicare wellness

## 2017-05-24 NOTE — Assessment & Plan Note (Signed)
Blood pressure treatment / medications per cardiology. - BP stable, patient declines symptoms today.

## 2017-05-24 NOTE — Assessment & Plan Note (Signed)
Treatment per cardiology.   - When patient comes in fasting for next office visit in 4-6 months for CPE, we will obtain fasting lipid profile if not already done as this can be shared with cardiology

## 2017-05-27 ENCOUNTER — Other Ambulatory Visit: Payer: Self-pay | Admitting: Cardiology

## 2017-05-27 DIAGNOSIS — I739 Peripheral vascular disease, unspecified: Secondary | ICD-10-CM

## 2017-06-09 ENCOUNTER — Ambulatory Visit (HOSPITAL_COMMUNITY)
Admission: RE | Admit: 2017-06-09 | Discharge: 2017-06-09 | Disposition: A | Discharge status: Home / Self Care | Payer: Medicare HMO | Source: Ambulatory Visit | Attending: Cardiovascular Disease | Admitting: Cardiovascular Disease

## 2017-06-09 DIAGNOSIS — I739 Peripheral vascular disease, unspecified: Secondary | ICD-10-CM | POA: Insufficient documentation

## 2017-06-09 DIAGNOSIS — E785 Hyperlipidemia, unspecified: Secondary | ICD-10-CM | POA: Insufficient documentation

## 2017-06-09 DIAGNOSIS — I2510 Atherosclerotic heart disease of native coronary artery without angina pectoris: Secondary | ICD-10-CM | POA: Insufficient documentation

## 2017-06-09 DIAGNOSIS — I10 Essential (primary) hypertension: Secondary | ICD-10-CM | POA: Insufficient documentation

## 2017-06-09 DIAGNOSIS — Z87891 Personal history of nicotine dependence: Secondary | ICD-10-CM | POA: Insufficient documentation

## 2017-06-21 ENCOUNTER — Ambulatory Visit (INDEPENDENT_AMBULATORY_CARE_PROVIDER_SITE_OTHER): Payer: Medicare HMO | Admitting: Cardiology

## 2017-06-21 ENCOUNTER — Encounter: Payer: Self-pay | Admitting: Cardiology

## 2017-06-21 VITALS — BP 124/78 | HR 72 | Ht 67.0 in | Wt 212.0 lb

## 2017-06-21 DIAGNOSIS — I10 Essential (primary) hypertension: Secondary | ICD-10-CM

## 2017-06-21 DIAGNOSIS — I779 Disorder of arteries and arterioles, unspecified: Secondary | ICD-10-CM | POA: Diagnosis not present

## 2017-06-21 DIAGNOSIS — Z79899 Other long term (current) drug therapy: Secondary | ICD-10-CM | POA: Diagnosis not present

## 2017-06-21 DIAGNOSIS — E785 Hyperlipidemia, unspecified: Secondary | ICD-10-CM

## 2017-06-21 DIAGNOSIS — I25118 Atherosclerotic heart disease of native coronary artery with other forms of angina pectoris: Secondary | ICD-10-CM | POA: Diagnosis not present

## 2017-06-21 DIAGNOSIS — I2510 Atherosclerotic heart disease of native coronary artery without angina pectoris: Secondary | ICD-10-CM | POA: Diagnosis not present

## 2017-06-21 DIAGNOSIS — I739 Peripheral vascular disease, unspecified: Secondary | ICD-10-CM

## 2017-06-21 DIAGNOSIS — J9611 Chronic respiratory failure with hypoxia: Secondary | ICD-10-CM | POA: Diagnosis not present

## 2017-06-21 LAB — LIPID PANEL
CHOL/HDL RATIO: 3.9 ratio (ref 0.0–5.0)
Cholesterol, Total: 143 mg/dL (ref 100–199)
HDL: 37 mg/dL — AB (ref >39)
LDL Calculated: 72 mg/dL (ref 0–99)
TRIGLYCERIDES: 169 mg/dL — AB (ref 0–149)
VLDL CHOLESTEROL CAL: 34 mg/dL (ref 5–40)

## 2017-06-21 LAB — COMPREHENSIVE METABOLIC PANEL
A/G RATIO: 1.5 (ref 1.2–2.2)
ALT: 35 IU/L (ref 0–44)
AST: 27 IU/L (ref 0–40)
Albumin: 4.3 g/dL (ref 3.5–4.8)
Alkaline Phosphatase: 65 IU/L (ref 39–117)
BUN / CREAT RATIO: 14 (ref 10–24)
BUN: 20 mg/dL (ref 8–27)
Bilirubin Total: 0.4 mg/dL (ref 0.0–1.2)
CO2: 22 mmol/L (ref 20–29)
CREATININE: 1.44 mg/dL — AB (ref 0.76–1.27)
Calcium: 9.3 mg/dL (ref 8.6–10.2)
Chloride: 105 mmol/L (ref 96–106)
GFR, EST AFRICAN AMERICAN: 55 mL/min/{1.73_m2} — AB (ref >59)
GFR, EST NON AFRICAN AMERICAN: 48 mL/min/{1.73_m2} — AB (ref >59)
GLOBULIN, TOTAL: 2.8 g/dL (ref 1.5–4.5)
GLUCOSE: 101 mg/dL — AB (ref 65–99)
POTASSIUM: 5.6 mmol/L — AB (ref 3.5–5.2)
SODIUM: 142 mmol/L (ref 134–144)
Total Protein: 7.1 g/dL (ref 6.0–8.5)

## 2017-06-21 NOTE — Assessment & Plan Note (Signed)
Well-controlled on current dose of beta-blocker.  Had been on ARB in the past, not sure when this was discontinued.

## 2017-06-21 NOTE — Patient Instructions (Addendum)
[facility]-- DI NOT EAT OR DRINK THE MORNING OF TEST --CMP --LIPID    NO CHANGE WITH MEDICATIONS     Your physician wants you to follow-up in 12 MONTHS WITH DR HARDING. You will receive a reminder letter in the mail two months in advance. If you don't receive a letter, please call our office to schedule the follow-up appointment.    If you need a refill on your cardiac medications before your next appointment, please call your pharmacy.

## 2017-06-21 NOTE — Assessment & Plan Note (Addendum)
Distant history of MI.  Post CABG now with occluded vein graft to the right.  Interestingly, his RCA recanalized and was then stented during his last catheterization early this summer.  We will follow-up with routine Myoview likely check in 2 years as opposed to 4.  Plan: Continue aspirin Plavix and statin along with beta-blocker.

## 2017-06-21 NOTE — Assessment & Plan Note (Signed)
I admonished him that he should be using his home oxygen.  He tells me that he short of breath when he walks, but he does not use his oxygen as directed.  He should be wearing it at nighttime and with activity.  He does not want to wear it with baseline rest, that is fine, but with any walking or at sleep he should wear it.  He also has declined using any pulmonary medications.

## 2017-06-21 NOTE — Assessment & Plan Note (Signed)
Essentially stable Dopplers.  Can you aspirin Plavix and statin. He is taking a beta Blocker, but not ARB as he previously had been.

## 2017-06-21 NOTE — Progress Notes (Signed)
PCP: Mellody Dance, DO  Clinic Note: Chief Complaint  Patient presents with  . Follow-up    3 months  . Shortness of Breath    Long-standing  . Coronary Artery Disease  . PAD    HPI: Michael [name] is a 71 y.o. male with a PMH below who presents today for 3 month follow-up for CAD and cardiomyopathy history of CABG x 2 & now PCI of previously CTO RCA with 100% SVG-RCA 12/03/2016 by Bernerd Pho, PA. This was a hospital follow-up visit. He had a heart catheterization on April 3 in response to an abnormal Myoview showing inferior ischemia.  Michael [name] was last seen on   As usual, he denied any chest tightness or pressure. He was on home oxygen that he did not wear to the office. He has continued to refuse pulmonology evaluation, and barely uses any of his  Recent Hospitalizations: 04/03-11/2016 for cardiac catheterization  Studies Personally Reviewed - (if available, images/films reviewed: From Epic Chart or Care Everywhere)  Aorto-iliac Dopplers June 09, 2017 : Known right common iliac occlusion with biphasic flow elsewhere.   Stable 50%> left common iliac stenosis status post stent.  Biphasic flow pre-and post stent.  Interval History: Michael [name] returns today somewhat cynical as usual.  He notes that initially after the PCI, he did note some improvement in his exertional dyspnea, but slowly it has also faded in its benefit.  He thinks it may also probably be related to the fact that he is no longer using oxygen.  Otherwise, he denies any anginal symptoms of chest tightness or pressure, indicating that he never really had any angina in the past. He still is dyspneic with talking and walking.  He is still reluctant to use oxygen.  He used to use oxygen at night when sleeping, but is still not doing that frequently. He really denies much the way of any claudication, indicating that he does not walk enough to really get claudication.  If he does he will note right buttock and calf  claudication that is stable. He denies any heart failure symptoms of PND, orthopnea, and the edema is well controlled with out any Lasix.  He indicates that the best improvement of symptoms he noted was reduction in his edema since PCI. He denies any rapid irregular heartbeats or palpitations.  No lightheadedness, dizziness or syncope/near syncope, TIA/am he seems to be tolerating the combination of atorvastatin aurosis fugax Symptoms.  He seems to be tolerating the combination of atorvastatin and fenofibrate.  Unfortunately, he never did get his labs checked,   So I do not have any recent labs.  Apparently his PCP check blood, but there are no lipid results. He denies any melena, hematochezia, hematuria, epistaxis or significant bruising.  ROS: A comprehensive was performed. Review of Systems  Constitutional: Negative for malaise/fatigue (Deconditioned).  HENT: Negative for congestion, hearing loss, nosebleeds and sinus pain.   Eyes: Negative.   Respiratory: Positive for cough, shortness of breath and wheezing. Negative for hemoptysis.   Cardiovascular: Negative.        Per history of present illness  Gastrointestinal: Positive for heartburn. Negative for constipation, nausea and vomiting.  Genitourinary: Negative for dysuria and frequency.  Musculoskeletal: Positive for joint pain (Hip pain). Negative for myalgias.  Neurological: Positive for dizziness (Only if he is hypoxic -- better if he actually uses home oxygen.). Negative for weakness.  Endo/Heme/Allergies: Does not bruise/bleed easily.  Psychiatric/Behavioral: Negative for depression and memory loss. The patient is not  nervous/anxious and does not have insomnia.   All other systems reviewed and are negative.  I have reviewed and (if needed) personally updated the patient's problem list, medications, allergies, past medical and surgical history, social and family history.   Past Medical History:  Diagnosis Date  . Arthritis     "right hip; right wrist" (11/24/2016)  . CAD, multiple vessel 05/2011   90% mid LAD with diffuse proximal stenosis, 100% proximal RCA --> s/p CABG x 2 (LIMA-LAD, SVG-dRCA); b) Lexiscan Myoview 09/2016 w/ Inferior-Inferolateral defect -> Cath:  11/24/16: 100% CTO of SVG-RCA (but nagtive /RCA only subtotal occlusion) --> PCI with DES x2 (resolute onyx DES 2.25 mm x 30 mm, 2.5 mm x 20 mm) RCA  . Carotid artery occlusion   . COPD (chronic obstructive pulmonary disease) with emphysema ([place])   . Essential hypertension   . History of gout   . History of tobacco abuse    quit 05/2011  . Hyperlipidemia with target LDL less than 70   . Hypothyroidism   . MI (myocardial infarction) ([place]) 05/2011   "I had 3 in 2 days"  . NSTEMI (non-ST elevated myocardial infarction) ([place]) 06/05/2011   With CHF - probably late presentation for missed STEMI --> CATH- MV CAD;; Echo 07/2001: EF > 55%, mild MAC, mild TR, no WMA  with Post-op Setpal dyskinesis, [place] not seen)  . PAD (peripheral artery disease) ([place]) 06/08/2011   a) R Common Iliac 100%, L Common Iliac 80+% occlusion; b) L CIA - Aterectomy- 10 mm x 40 mm self-extending Stent (postdilated to 7 mm with 20-30% residual)--> Deferred R AoFem bypass;; c) LEA Dopplers 04/2012 & 04/2014 - RABI 0.8 & LABI 1.1 - STABLE, no change  . Pneumonia 1940s X 1  . Presence of drug coated stent in right coronary artery 11/24/2016   p-mRCA: Resolute Onyx DES 2.25 mm x 38 mm --> 2.5 mm x 18 mm (post-dilated from 2.7-2.8 mm)    Past Surgical History:  Procedure Laterality Date  . CARDIAC CATHETERIZATION  06/09/2011  . CORONARY ANGIOPLASTY WITH STENT PLACEMENT  11/24/2016   with 100% SVG-RCA & flow in native RCA now present --> PCI-p-m RCA 2 Overlapping Resolute DES (2.25 x 38, 1.5 x 18)  . CORONARY ARTERY BYPASS GRAFT  06/10/2011   oLIMA to LAD, SVG to PDA (Dr. Servando Snare)  . CORONARY STENT INTERVENTION N/A 11/24/2016   Procedure: Coronary Stent Intervention;  Surgeon: Leonie Man, MD;   Location: [facility] INVASIVE CV LAB: [place] 2.25X38 & overlapping DES RESOLUTE ONYX 2.5X18   . ILIAC ARTERY STENT  10/27/2011   left CIA - 25mx40mm self-expanding stent, decreasing downt to 20-30% lesion, 100% occluded R CIA with extensive lumbar collateralization to R internal iliac;  05/2016: Ao-Ileac & LEA Dopplers: RABI 0.8, LABI 1.2. B TBI 0.77 -- he has known B disease s/p L CIA stent. RLABI decreased and LABI is normal; aortoiliac atherosclerosis w/ CTO of R CIA but normal REIA.  .Marland KitchenLEFT HEART CATH AND CORS/GRAFTS ANGIOGRAPHY N/A 11/24/2016   Procedure: LEFT HEART CATH AND CORS/GRAFTS ANGIOGRAPHY;  Surgeon: DLeonie Man MD;  Location: [facility] INVASIVE CV LAB: 100% SVG-RCA, but now native RCA only sub-CTO with antegrade flow.  85% mLAD after D1. 40% oOM1 --> PCI of Native RCA  . NM MYOVIEW LTD  09/2016    EF 62%. Medium sized, moderate severity defect in the basal inferior, mid inferoseptal and mid inferior walls is partially reversible. - INTERMEDIATE RISK -- CATH  .  PERCUTANEOUS STENT INTERVENTION Left 10/27/2011   Procedure: PERCUTANEOUS STENT INTERVENTION;  Surgeon: Lorretta Harp, MD;  Location: [facility] CATH LAB;  Service: Cardiovascular;  Laterality: Left;  . TONSILLECTOMY  ~ 1963  . TRANSTHORACIC ECHOCARDIOGRAM  08/19/2011   EF=>55%; mild mitral annular calcif; mild TR    11/24/2016  LEFT HEART CATH AND CORS/GRAFTS ANGIOGRAPHY with Coronary Stent Intervention    LV end diastolic pressure is normal. The left ventricular ejection fraction is 55-65% by visual estimate.  ________________________________  Prox LAD to Mid LAD lesion, 65 %stenosed after D1. Then Mid LAD lesion, 100 %stenosed. Distal LAD fills via LIMA  LIMA-LAD and is normal in caliber and anatomically normal.  SVG-RPDA: Origin to Prox Graft lesion, 100 %stenosed.  Prox RCA lesion, 70 %stenosed. Mid RCA-1 lesion, 99 %stenosed. Mid RCA-2 lesion, 65 %stenosed.  A STENT RESOLUTE ONYX 5.39J67 drug eluting stent  &  overlapping STENT RESOLUTE ONYX 2.5X18 drug eluting stent were successfully placed, --> Post intervention, there is a 0% residual stenosis.   Dx Angio       Post PCI        Current Meds  Medication Sig  . atorvastatin (LIPITOR) 20 MG tablet ALTERNATE 40 MG (2 TABLETS) EVERY OTHER DAY WITH 20 MG ( 1 TABLET) AT BEDTIME.  . clopidogrel (PLAVIX) 75 MG tablet Take 1 tablet (75 mg total) by mouth daily.  . fenofibrate (TRICOR) 48 MG tablet TAKE 1 TABLET BY MOUTH DAILY  . levothyroxine (SYNTHROID, LEVOTHROID) 88 MCG tablet Take 1 tablet (88 mcg total) by mouth daily before breakfast.  . metoprolol tartrate (LOPRESSOR) 25 MG tablet TAKE 1 TABLET BY MOUTH TWICE DAILY  . nitroGLYCERIN (NITROSTAT) 0.4 MG SL tablet Place 1 tablet (0.4 mg total) under the tongue every 5 (five) minutes as needed.  . [DISCONTINUED] aspirin 81 MG chewable tablet Chew 1 tablet (81 mg total) by mouth daily.    Allergies  Allergen Reactions  . Ace Inhibitors Cough    Social History   Social History  . Marital status: Divorced    Spouse name: N/A  . Number of children: 3  . Years of education: N/A   Social History Main Topics  . Smoking status: Former Smoker    Packs/day: 3.00    Years: 50.00    Types: Cigarettes    Quit date: 06/08/2011  . Smokeless tobacco: Former Systems developer    Types: Chew    Quit date: 07/13/2003  . Alcohol use No     Comment: 11/24/2016 "nothing in 2 1/2 years; never an alcoholic"  . Drug use: No  . Sexual activity: No   Other Topics Concern  . None   Social History Narrative  . None    family history includes CAD in his sister; Diabetes in his mother; Heart disease in his mother; Heart failure in his mother; Pneumonia in his father; Stroke in his sister.  Wt Readings from Last 3 Encounters:  06/21/17 212 lb (96.2 kg)  05/24/17 211 lb 14.4 oz (96.1 kg)  03/09/17 215 lb (97.5 kg)    PHYSICAL EXAM BP 124/78   Pulse 72   Ht _0  (1.702 m)   Wt 212 lb (96.2 kg)   BMI 33.20 kg/m     Physical Exam  Constitutional: He is oriented to person, place, and time. He appears well-developed and well-nourished.  Truncal obesity; chronically ill-appearing  HENT:  Head: Normocephalic and atraumatic.  Neck: No hepatojugular reflux and no JVD present. Carotid bruit is present (Soft bilateral  bruits).  Cardiovascular: Normal rate, regular rhythm, normal heart sounds and intact distal pulses.  PMI is not displaced.  Exam reveals decreased pulses (Decreased pedal pulses in the right foot). Exam reveals no gallop and no friction rub.   No murmur heard. Pulmonary/Chest: He exhibits no tenderness.  Baseline accessory muscle use.  Not labored however.  Increased AP diameter with diffuse interstitial sounds/crackles bilaterally without wheezes rales or rhonchi.  Diminished breath sounds throughout.  Abdominal: Soft. Bowel sounds are normal. He exhibits no distension. There is no tenderness. There is no rebound.  Musculoskeletal: Normal range of motion. He exhibits edema.  Neurological: He is alert and oriented to person, place, and time.  Skin: Skin is warm and dry. No rash noted. No erythema.  Psychiatric: He has a normal mood and affect. His behavior is normal. Judgment and thought content normal.     Adult ECG Report n/a  Other studies Reviewed: Additional studies/ records that were reviewed today include:  Recent Labs:  Followed by PCP (plan for March 2018)   ASSESSMENT / PLAN: Problem List Items Addressed This Visit    CAD (coronary atherosclerotic disease) (Chronic)   Relevant Orders   Comprehensive metabolic panel (Completed)   CAD, multiple vessel - status post CABG: LIMA-LAD, SVG-RPDA; PCI native RCA for 100% SVG-RCA - Primary (Chronic)    Distant history of MI.  Post CABG now with occluded vein graft to the right.  Interestingly, his RCA recanalized and was then stented during his last catheterization early this summer.  We will follow-up with routine Myoview likely check  in 2 years as opposed to 4.  Plan: Continue aspirin Plavix and statin along with beta-blocker.      Chronic respiratory failure with hypoxia (HCC) - SaO2 drops to 87% with ambulation (Chronic)    I admonished him that he should be using his home oxygen.  He tells me that he short of breath when he walks, but he does not use his oxygen as directed.  He should be wearing it at nighttime and with activity.  He does not want to wear it with baseline rest, that is fine, but with any walking or at sleep he should wear it.  He also has declined using any pulmonary medications.      Essential hypertension (Chronic)    Well-controlled on current dose of beta-blocker.  Had been on ARB in the past, not sure when this was discontinued.      Hyperlipidemia with target LDL less than 70 (Chronic)    Plan had been for him to get lipids checked by his PCP.  At this point I want to check it now Read.  He is on statin plus fenofibrate.      Relevant Orders   Lipid panel (Completed)   Comprehensive metabolic panel (Completed)   PAD (peripheral artery disease) - Right Common Iliac Occlusion, Left Common Iliac 80+% occlusion (Chronic)    Essentially stable Dopplers.  Can you aspirin Plavix and statin. He is taking a beta Blocker, but not ARB as he previously had been.      Peripheral arterial occlusive disease (HCC)   Relevant Orders   Comprehensive metabolic panel (Completed)    Other Visit Diagnoses    Medication management       Relevant Orders   Lipid panel (Completed)   Comprehensive metabolic panel (Completed)      Current medicines are reviewed at length with the patient today. (+/- concerns) n/a The following changes have been made:  see below  Patient Instructions  LAB[facility]- [facility]-- DI NOT EAT OR DRINK THE MORNING OF TEST --CMP --LIPID    NO CHANGE WITH MEDICATIONS     Your physician wants you to follow-up in [place]. You will receive a reminder letter in the  mail two months in advance. If you don't receive a letter, please call our office to schedule the follow-up appointment.    If you need a refill on your cardiac medications before your next appointment, please call your pharmacy.    Studies Ordered:   Orders Placed This Encounter  Procedures  . Lipid panel  . Comprehensive metabolic panel    Glenetta Hew, M.D., M.S. Interventional Cardiologist   Pager # [phone] Phone # [phone] [address] [place], [place] [address]

## 2017-06-21 NOTE — Assessment & Plan Note (Signed)
Plan had been for him to get lipids checked by his PCP.  At this point I want to check it now Read.  He is on statin plus fenofibrate.

## 2017-07-01 ENCOUNTER — Other Ambulatory Visit: Payer: Self-pay | Admitting: Cardiology

## 2017-07-09 ENCOUNTER — Telehealth: Payer: Self-pay | Admitting: *Deleted

## 2017-07-09 DIAGNOSIS — Z79899 Other long term (current) drug therapy: Secondary | ICD-10-CM

## 2017-07-09 DIAGNOSIS — E875 Hyperkalemia: Secondary | ICD-10-CM

## 2017-07-09 NOTE — Telephone Encounter (Signed)
-----   Message from Marykay Lexavid W Harding, MD sent at 06/29/2017  8:57 PM EST ----- Lipid panel: Triglycerides are much better than they were last year.  Almost at goal.  HDL has improved.  LDL is slightly up but stable.  Kidney function seems to have somewhat stabilized out a little bit worse than it was 7 months ago.  Potassium is low but high. -- need to increase hydration.  Probably need to recheck potassium/BMP level next week.   Bryan Lemmaavid Harding, MD

## 2017-07-09 NOTE — Telephone Encounter (Signed)
Spoke to patient. Result given . Verbalized understanding Aware to increase fluids a, come next week for labs--bmp  Order placed for bmp

## 2017-07-12 LAB — BASIC METABOLIC PANEL
BUN / CREAT RATIO: 11 (ref 10–24)
BUN: 16 mg/dL (ref 8–27)
CO2: 24 mmol/L (ref 20–29)
Calcium: 9.6 mg/dL (ref 8.6–10.2)
Chloride: 106 mmol/L (ref 96–106)
Creatinine, Ser: 1.42 mg/dL — ABNORMAL HIGH (ref 0.76–1.27)
GFR calc non Af Amer: 49 mL/min/{1.73_m2} — ABNORMAL LOW (ref >59)
GFR, EST AFRICAN AMERICAN: 56 mL/min/{1.73_m2} — AB (ref >59)
Glucose: 98 mg/dL (ref 65–99)
POTASSIUM: 5.1 mmol/L (ref 3.5–5.2)
SODIUM: 143 mmol/L (ref 134–144)

## 2017-08-02 ENCOUNTER — Other Ambulatory Visit: Payer: Self-pay | Admitting: Cardiology

## 2017-10-22 ENCOUNTER — Encounter: Payer: Self-pay | Admitting: Family Medicine

## 2017-10-22 ENCOUNTER — Ambulatory Visit (INDEPENDENT_AMBULATORY_CARE_PROVIDER_SITE_OTHER): Payer: Medicare HMO | Admitting: Family Medicine

## 2017-10-22 VITALS — BP 132/74 | HR 61 | Ht 67.0 in | Wt 219.8 lb

## 2017-10-22 DIAGNOSIS — J439 Emphysema, unspecified: Secondary | ICD-10-CM

## 2017-10-22 DIAGNOSIS — Z122 Encounter for screening for malignant neoplasm of respiratory organs: Secondary | ICD-10-CM

## 2017-10-22 DIAGNOSIS — Z5320 Procedure and treatment not carried out because of patient's decision for unspecified reasons: Secondary | ICD-10-CM | POA: Diagnosis not present

## 2017-10-22 DIAGNOSIS — Z1211 Encounter for screening for malignant neoplasm of colon: Secondary | ICD-10-CM | POA: Diagnosis not present

## 2017-10-22 DIAGNOSIS — Z0000 Encounter for general adult medical examination without abnormal findings: Secondary | ICD-10-CM | POA: Diagnosis not present

## 2017-10-22 DIAGNOSIS — I10 Essential (primary) hypertension: Secondary | ICD-10-CM

## 2017-10-22 DIAGNOSIS — E785 Hyperlipidemia, unspecified: Secondary | ICD-10-CM | POA: Diagnosis not present

## 2017-10-22 DIAGNOSIS — Z9119 Patient's noncompliance with other medical treatment and regimen: Secondary | ICD-10-CM

## 2017-10-22 DIAGNOSIS — E039 Hypothyroidism, unspecified: Secondary | ICD-10-CM | POA: Diagnosis not present

## 2017-10-22 DIAGNOSIS — I779 Disorder of arteries and arterioles, unspecified: Secondary | ICD-10-CM

## 2017-10-22 DIAGNOSIS — I70213 Atherosclerosis of native arteries of extremities with intermittent claudication, bilateral legs: Secondary | ICD-10-CM

## 2017-10-22 DIAGNOSIS — Z719 Counseling, unspecified: Secondary | ICD-10-CM

## 2017-10-22 DIAGNOSIS — Z91199 Patient's noncompliance with other medical treatment and regimen due to unspecified reason: Secondary | ICD-10-CM

## 2017-10-22 DIAGNOSIS — Z87891 Personal history of nicotine dependence: Secondary | ICD-10-CM

## 2017-10-22 NOTE — Progress Notes (Signed)
Male physical  Impression and Recommendations:    1. Encounter for wellness examination   2. Health education/counseling   3. Colonoscopy refused   4. Screening for colon cancer- cologuard   5. Encounter for screening for malignant neoplasm of respiratory organs   6. Personal history of noncompliance with medical treatment   7. Noncompliance with immunization regimen   8. Pulmonary emphysema, unspecified emphysema type ([place])   9. Hyperlipidemia with target LDL less than 70   10. History of tobacco abuse- 2+ppd for 50+ yrs- quit in Jun 08 2011   11. Acquired hypothyroidism   12. Essential hypertension   13. Peripheral arterial occlusive disease ([place])   14. Atherosclerosis of native artery of both lower extremities with intermittent claudication ([place])     - Patient is non-compliant.  Per patient, "I only go to doctors when I absolutely have to."  1) Anticipatory Guidance: Discussed importance of wearing a seatbelt while driving, not texting while driving;   sunscreen when outside along with skin surveillance; eating a balanced and modest diet; physical activity at least 25 minutes per day or 150 min/ week moderate to intense activity.  2) Immunizations / Screenings / Labs:  All immunizations are up-to-date per recommendations or will be updated today. Patient is due for dental and vision screens which pt will schedule independently. Will obtain CBC, CMP, HgA1c, Lipid panel, TSH and vit D when fasting, if not already done recently.   3) Weight:  BMI meaning discussed with patient.  Discussed goal of losing 5-10% of current body weight which would improve overall feelings of well being and improve objective health data. Improve nutrient density of diet through increasing intake of fruits and vegetables and decreasing saturated fats, white flour products and refined sugars.    4.) Non-Compliance - Patient declines further evaluations at this time, however education was provided at  length, and several recommendations were made for screenings and preventative care.  - Advised the patient at length that his risk for several health disorders is significantly increased due to tobacco use (history of 150 pack-year at 3 packs a day).    - Patient declines screening universally.  Per patient, "might do it one day, but not now."   5.) COPD - STRONGLY advised the patient to wear his oxygen wherever he goes.   6.) Colonoscopy Screening - Recommended colonoscopy to the patient.  Patient declines at this time. - Educated patient about the importance of obtaining a colonoscopy, especially given his history.  - As an alternative to colonoscopy, recommended ColoGuard to the patient.  Agrees to [facility], CMA :  to setup cologuard   7.) CT Lung Cancer Screening - Recommended CT scan of the chest for lung cancer.  Patient agrees to CT screen. - Educated the patient about the importance of screening for lung cancer given his history.   8.) Hearing Evaluation - Recommended a hearing evaluation.  Patient declines at this time.    9.) Cardiovascular Health - Patient should continue to follow up with Dr. Leonie Man for Cardiovascular.  - Recommended continued Triple A screen for the patient through Cardio.   10.) [place] patient to continue receiving regular dilated eye exams.   11.) Fasting blood work drawn today.   Orders Placed This Encounter  Procedures  . CT CHEST LUNG CANCER SCREENING LOW DOSE WO CONTRAST    Epic order Wt 219/ no needs/ fwp and [facility] [employer]  Standing Status:   Future    Standing Expiration Date:   12/23/2018    Order Specific Question:   Preferred Imaging Location?    Answer:   [id number]    Order Specific Question:   Preferred imaging location?    Answer:   [facility]    Order Specific Question:   Call Results- Best Contact Number?    Answer:   [phone]    Order  Specific Question:   Radiology Contrast Protocol - do NOT remove file path    Answer:   [email or web address]  . Cologuard    Standing Status:   Future    Standing Expiration Date:   04/24/2018  . CBC with Differential/Platelet  . Comprehensive metabolic panel    Order Specific Question:   Has the patient fasted?    Answer:   Yes  . Hemoglobin A1c  . Hepatitis C antibody  . HIV antibody  . Lipid panel    Order Specific Question:   Has the patient fasted?    Answer:   Yes  . Vitamin B12  . VITAMIN D 25 Hydroxy (Vit-D Deficiency, Fractures)  . TSH  . T4, free  . Visual acuity screening    Gross side effects, risk and benefits, and alternatives of medications discussed with patient.  Patient is aware that all medications have potential side effects and we are unable to predict every side effect or drug-drug interaction that may occur.  Expresses verbal understanding and consents to current therapy plan and treatment regimen.  Please see AVS handed out to patient at the end of our visit for further patient instructions/ counseling done pertaining to today's office visit.  Follow-up preventative CPE in 1 year. Follow-up office visit pending lab work.  F/up sooner for chronic care management and/or prn  This document serves as a record of services personally performed by Mellody Dance, DO. It was created on her behalf by Toni Amend, a trained medical scribe. The creation of this record is based on the scribe's personal observations and the provider's statements to them.   I have reviewed the above medical documentation for accuracy and completeness and I concur.  Mellody Dance 10/22/17 12:37 PM     Subjective:    CC: CPE  HPI: Michael [name] is a 72 y.o. male who presents to [place] [facility] today for a yearly health maintenance exam.    Physician re-measured blood pressure in left arm in office: 132/74  Health Maintenance  Summary Reviewed and updated, unless pt declines services.  Colonoscopy:    Patient has never had a colonoscopy. Tobacco History Reviewed:   150 pack-year history, used to smoke 3 packs a day.  Quit smoking in 2012. CT scan for screening lung CA:   Has never had CT screen for lung CA. Abdominal Ultrasound:  Patient has had abdominal ultrasound in the past. Alcohol:    No concerns, no excessive use Exercise Habits:   Not meeting AHA guidelines. STD concerns:   none Drug Use:   None Birth control method:   n/a Testicular/penile concerns:     Not at this time.  On oxygen for COPD.  Anything he has to do without the oxygen tank, he gets SOB and has to rest.  Notes that he feels tired because of his COPD - "I ain't never been depressed."  Patient did not wear his oxygen to his appointment today, noting that he lives close by. Drove himself here today,  noting "I can drive like a champ."  Notes "I don't eat well, I don't exercise."  Does mow his backyard.  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  Says that he "always will."  He says that since he quit drinking, he "doesn't have a bit of problems" getting around the house. He quit drinking three years ago, and quit smoking in 2012 (after his heart attacks).  150 pack-year history, 3 packs a day.  He says "I don't do shots and I don't do anything up my butt." Declines colonoscopy because "I don't want anything stuck up there."  He follows up with Dr. Leonie Man for Cardiovascular health.  Patient has never had a CT scan of the chest to screen for lung cancer.  Regularly obtains dilated eye exams.  Notes that he has cataracts.  Wears his upper dentures all the time, but not his lower.  Does not follow up with a dentist.  Does not visit  dermatology.  Denies vomiting, constipation, nausea, diarrhea, or GI discomfort.  6CIT Screen 10/22/2017  What Year? 0 points  What month? 0 points  What time? 0 points  Count back from 20 0 points  Months in reverse 0 points  Repeat phrase 10 points  Total Score 10     Health Maintenance  Topic Date Due  . INFLUENZA VACCINE  05/24/2018 (Originally 03/24/2017)  . COLONOSCOPY  05/24/2018 (Originally 04/24/1994)  . TETANUS/TDAP  05/24/2018 (Originally 04/25/1963)  . Hepatitis C Screening  05/24/2018 (Originally 12/18/1945)  . PNA vac Low Risk Adult (1 of 2 - PCV13) 05/24/2018 (Originally 04/24/2009)     Depression screen [facility] 2/9 10/22/2017 10/22/2017 05/24/2017  Decreased Interest 0 0 3  Down, Depressed, Hopeless 0 0 0  PHQ - 2 Score 0 0 3  Altered sleeping 0 - 0  Tired, decreased energy 0 - 3  Change in appetite 0 - 0  Feeling bad or failure about yourself  0 - 0  Trouble concentrating 3 - 3  Moving slowly or fidgety/restless 0 - 0  Suicidal thoughts 0 - 0  PHQ-9 Score 3 - 9  Difficult doing work/chores Not difficult at all - Not difficult at all    Wt Readings from Last 3 Encounters:  10/22/17 219 lb 12.8 oz (99.7 kg)  06/21/17 212 lb (96.2 kg)  05/24/17 211 lb 14.4 oz (96.1 kg)   BP Readings from Last 3 Encounters:  10/22/17 132/74  06/21/17 124/78  05/24/17 (!) 146/82   Pulse Readings from Last 3 Encounters:  10/22/17 61  06/21/17 72  05/24/17 68    Patient Active Problem List   Diagnosis Date Noted  . History of tobacco abuse- 2+ppd for 50+ yrs- quit in Jun 08 2011 05/24/2017    Priority: High  . Hyperlipidemia with target LDL less than 70 05/21/2013    Priority: High  . Essential hypertension     Priority: High    Class: Diagnosis of  . Hypothyroidism     Priority: High    Class: Diagnosis of  . COPD (chronic obstructive pulmonary disease) ([place]) 06/10/2011    Priority: High    Class: Diagnosis of  . Chronic respiratory failure with hypoxia (HCC) - SaO2 drops  to 87% with ambulation 06/18/2015    Priority: Medium  .  Atherosclerosis of native artery of extremity ([place]) 12/10/2011    Priority: Medium  . Peripheral arterial occlusive disease ([place]) 10/22/2011    Priority: Medium  . CAD, multiple vessel - status post CABG: LIMA-LAD, SVG-RPDA; PCI native RCA for 100% SVG-RCA 06/08/2011    Priority: Medium    Class: History of  . Colonoscopy refused 05/24/2017    Priority: Low  . PAD (peripheral artery disease) - Right Common Iliac Occlusion, Left Common Iliac 80+% occlusion 10/22/2011    Priority: Low    Class: Diagnosis of  . Hyperkalemia 07/09/2017  . CAD (coronary atherosclerotic disease) 05/24/2017  . Noncompliance with immunization regimen 05/24/2017  . Personal history of noncompliance with medical treatment 05/24/2017  . Presence of drug coated stent in right coronary artery 11/24/2016  . Abnormal nuclear stress test 11/12/2016  . HOH (hard of hearing) 11/25/2015  . Encounter for long-term (current) use of other medications 05/21/2013  . Atherosclerosis 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 12/10/2011  . S/P CABG x 2 06/10/2011    Class: History of  . Multiple vessel coronary artery disease 06/08/2011    Past Medical History:  Diagnosis Date  . Arthritis    "right hip; right wrist" (11/24/2016)  . CAD, multiple vessel 05/2011   90% mid LAD with diffuse proximal stenosis, 100% proximal RCA --> s/p CABG x 2 (LIMA-LAD, SVG-dRCA); b) Lexiscan Myoview 09/2016 w/ Inferior-Inferolateral defect -> Cath:  11/24/16: 100% CTO of SVG-RCA (but nagtive /RCA only subtotal occlusion) --> PCI with DES x2 (resolute onyx DES 2.25 mm x 30 mm, 2.5 mm x 20 mm) RCA  . Carotid artery occlusion   . COPD (chronic obstructive pulmonary disease) with emphysema ([place])   . Essential hypertension   . History of gout   . History of tobacco abuse    quit 05/2011  . Hyperlipidemia with target LDL less than 70   . Hypothyroidism   . MI  (myocardial infarction) ([place]) 05/2011   "I had 3 in 2 days"  . NSTEMI (non-ST elevated myocardial infarction) ([place]) 06/05/2011   With CHF - probably late presentation for missed STEMI --> CATH- MV CAD;; Echo 07/2001: EF > 55%, mild MAC, mild TR, no WMA  with Post-op Setpal dyskinesis, [place] not seen)  . PAD (peripheral artery disease) ([place]) 06/08/2011   a) R Common Iliac 100%, L Common Iliac 80+% occlusion; b) L CIA - Aterectomy- 10 mm x 40 mm self-extending Stent (postdilated to 7 mm with 20-30% residual)--> Deferred R AoFem bypass;; c) LEA Dopplers 04/2012 & 04/2014 - RABI 0.8 & LABI 1.1 - STABLE, no change  . Pneumonia 1940s X 1  . Presence of drug coated stent in right coronary artery 11/24/2016   p-mRCA: Resolute Onyx DES 2.25 mm x 38 mm --> 2.5 mm x 18 mm (post-dilated from 2.7-2.8 mm)    Past Surgical History:  Procedure Laterality Date  . CARDIAC CATHETERIZATION  06/09/2011  . CORONARY ANGIOPLASTY WITH STENT PLACEMENT  11/24/2016   with 100% SVG-RCA & flow in native RCA now present --> PCI-p-m RCA 2 Overlapping Resolute DES (2.25 x 38, 1.5 x 18)  . CORONARY ARTERY BYPASS GRAFT  06/10/2011   oLIMA to LAD, SVG to PDA (Dr. Servando Snare)  . CORONARY STENT INTERVENTION N/A 11/24/2016   Procedure: Coronary Stent Intervention;  Surgeon: Leonie Man, MD;  Location: [facility] INVASIVE CV LAB: [place] 2.25X38 & overlapping DES RESOLUTE ONYX 2.5X18   . ILIAC ARTERY STENT  10/27/2011  left CIA - 11mx40mm self-expanding stent, decreasing downt to 20-30% lesion, 100% occluded R CIA with extensive lumbar collateralization to R internal iliac;  05/2016: Ao-Ileac & LEA Dopplers: RABI 0.8, LABI 1.2. B TBI 0.77 -- he has known B disease s/p L CIA stent. RLABI decreased and LABI is normal; aortoiliac atherosclerosis w/ CTO of R CIA but normal REIA.  .Marland KitchenLEFT HEART CATH AND CORS/GRAFTS ANGIOGRAPHY N/A 11/24/2016   Procedure: LEFT HEART CATH AND CORS/GRAFTS ANGIOGRAPHY;  Surgeon: DLeonie Man MD;  Location:  [facility] INVASIVE CV LAB: 100% SVG-RCA, but now native RCA only sub-CTO with antegrade flow.  85% mLAD after D1. 40% oOM1 --> PCI of Native RCA  . NM MYOVIEW LTD  09/2016    EF 62%. Medium sized, moderate severity defect in the basal inferior, mid inferoseptal and mid inferior walls is partially reversible. - INTERMEDIATE RISK -- CATH  . PERCUTANEOUS STENT INTERVENTION Left 10/27/2011   Procedure: PERCUTANEOUS STENT INTERVENTION;  Surgeon: JLorretta Harp MD;  [facility];  Service: Cardiovascular;  Laterality: Left;  . TONSILLECTOMY  ~ 1963  . TRANSTHORACIC ECHOCARDIOGRAM  08/19/2011   EF=>55%; mild mitral annular calcif; mild TR    Family History  Problem Relation Age of Onset  . Heart disease Mother        valve replacement  . Diabetes Mother   . Heart failure Mother   . CAD Sister        cardiac stent  . Stroke Sister        stents in neck  . Pneumonia Father     Social History   Substance and Sexual Activity  Drug Use No  ,  Social History   Substance and Sexual Activity  Alcohol Use No  . Alcohol/week: 0.0 oz   Comment: 11/24/2016 "nothing in 2 1/2 years; never an alcoholic"  ,  Social History   Tobacco Use  Smoking Status Former Smoker  . Packs/day: 3.00  . Years: 50.00  . Pack years: 150.00  . Types: Cigarettes  . Last attempt to quit: 06/08/2011  . 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 Types: Chew  . Quit date: 07/13/2003  ,  Social History   Substance and Sexual Activity  Sexual Activity No    Patient's Medications  New Prescriptions   No medications on file  Previous Medications   ATORVASTATIN (LIPITOR) 20 MG TABLET    TAKE 1 TABLET BY MOUTH ALTERNATING WITH TWO TABLETS EVERY OTHER DAY AT BEDTIME   CLOPIDOGREL (PLAVIX) 75 MG TABLET    Take 1 tablet (75 mg total) by mouth daily.   FENOFIBRATE (TRICOR) 48 MG TABLET    TAKE 1 TABLET BY MOUTH DAILY   LEVOTHYROXINE (SYNTHROID, LEVOTHROID) 88 MCG TABLET    Take 1 tablet (88  mcg total) by mouth daily before breakfast.   METOPROLOL TARTRATE (LOPRESSOR) 25 MG TABLET    TAKE 1 TABLET BY MOUTH TWICE DAILY   NITROGLYCERIN (NITROSTAT) 0.4 MG SL TABLET    Place 1 tablet (0.4 mg total) under the tongue every 5 (five) minutes as needed.  Modified Medications   No medications on file  Discontinued Medications   No medications on file    Ace inhibitors  Review of Systems: General:   Denies fever, chills, unexplained weight loss.  Optho/Auditory:   Denies visual changes, blurred vision/LOV Respiratory:   Denies SOB, DOE more than baseline levels.  Cardiovascular:   Denies chest pain, palpitations, new onset peripheral  edema  Gastrointestinal:   Denies nausea, vomiting, diarrhea.  Genitourinary: Denies dysuria, freq/ urgency, flank pain or discharge from genitals.  Endocrine:     Denies hot or cold intolerance, polyuria, polydipsia. Musculoskeletal:   Denies unexplained myalgias, joint swelling, unexplained arthralgias, gait problems.  Skin:  Denies rash, suspicious lesions Neurological:     Denies dizziness, unexplained weakness, numbness  Psychiatric/Behavioral:   Denies mood changes, suicidal or homicidal ideations, hallucinations    Objective:     Blood pressure 132/74, pulse 61, height 5' 7"  (1.702 m), weight 219 lb 12.8 oz (99.7 kg), SpO2 94 %. Body mass index is 34.43 kg/m.   General Appearance:    Alert, cooperative, no distress, appears stated age  Head:    Normocephalic, without obvious abnormality, atraumatic  Eyes:    PERRL, conjunctiva/corneas clear, EOM's intact, fundi    benign, both eyes  Ears:    Normal TM's and external ear canals, both ears  Nose:   Nares normal, septum midline, mucosa normal, no drainage    or sinus tenderness  Throat:   Lips w/o lesion, mucosa moist, and tongue normal; edentulous.  Wears upper dentures, but not lower.  Gums normal  Neck:   Supple, symmetrical, trachea midline, no adenopathy;    thyroid:  no  enlargement/tenderness/nodules; no carotid   bruit or JVD  Back:     Symmetric, no curvature, ROM normal, no CVA tenderness  Lungs:     Crackles appreciated bilaterally, right base greater than left base.  Breath sounds distant with increased exhalation phase. Positive conversational dyspnea (respiratoins slightly labors), no Wh/ R/ R  Chest Wall:    No tenderness or gross deformity; normal excursion   Heart:    Regular rate and rhythm, S1 and S2 normal, but distant.  No murmur, rub or gallop, no carotid bruits.  Abdomen:     Mildly distended, soft, non-tender, bowel sounds active all four quadrants, NO   G/R/R, no masses, no organomegaly  Genitalia:    Patient declined exam today.  Rectal:    Patient declined exam today.  Extremities:   Extremities normal, atraumatic, no cyanosis or gross edema  Pulses:   2+ and symmetric all extremities  Skin:   Warm, dry, Skin color, texture, turgor normal, no obvious rashes or lesions  M-Sk:   Ambulates * 4 w/o difficulty, no gross deformities, tone WNL  Neurologic:   CNII-XII intact, normal strength, sensation and reflexes    Throughout Psych:  No HI/SI, judgement and insight good, Euthymic mood. Full Affect.

## 2017-10-22 NOTE — Patient Instructions (Signed)
Preventive Care for Adults, Male A healthy lifestyle and preventive care can promote health and wellness. Preventive health guidelines for men include the following key practices:  A routine yearly physical is a good way to check with your health care provider about your health and preventative screening. It is a chance to share any concerns and updates on your health and to receive a thorough exam.  Visit your dentist for a routine exam and preventative care every 6 months. Brush your teeth twice a day and floss once a day. Good oral hygiene prevents tooth decay and gum disease.  The frequency of eye exams is based on your age, health, family medical history, use of contact lenses, and other factors. Follow your health care provider's recommendations for frequency of eye exams.  Eat a healthy diet. Foods such as vegetables, fruits, whole grains, low-fat dairy products, and lean protein foods contain the nutrients you need without too many calories. Decrease your intake of foods high in solid fats, added sugars, and salt. Eat the right amount of calories for you.Get information about a proper diet from your health care provider, if necessary.  Regular physical exercise is one of the most important things you can do for your health. Most adults should get at least 150 minutes of moderate-intensity exercise (any activity that increases your heart rate and causes you to sweat) each week. In addition, most adults need muscle-strengthening exercises on 2 or more days a week.  Maintain a healthy weight. The body mass index (BMI) is a screening tool to identify possible weight problems. It provides an estimate of body fat based on height and weight. Your health care provider can find your BMI and can help you achieve or maintain a healthy weight.For adults 20 years and older:  A BMI below 18.5 is considered underweight.  A BMI of 18.5 to 24.9 is normal.  A BMI of 25 to 29.9 is considered  overweight.  A BMI of 30 and above is considered obese.  Maintain normal blood lipids and cholesterol levels by exercising and minimizing your intake of saturated fat. Eat a balanced diet with plenty of fruit and vegetables. Blood tests for lipids and cholesterol should begin at age 55 and be repeated every 5 years. If your lipid or cholesterol levels are high, you are over 50, or you are at high risk for heart disease, you may need your cholesterol levels checked more frequently.Ongoing high lipid and cholesterol levels should be treated with medicines if diet and exercise are not working.  If you smoke, find out from your health care provider how to quit. If you do not use tobacco, do not start.  Lung cancer screening is recommended for adults aged 90-80 years who are at high risk for developing lung cancer because of a history of smoking. A yearly low-dose CT scan of the lungs is recommended for people who have at least a 30-pack-year history of smoking and are a current smoker or have quit within the past 15 years. A pack year of smoking is smoking an average of 1 pack of cigarettes a day for 1 year (for example: 1 pack a day for 30 years or 2 packs a day for 15 years). Yearly screening should continue until the smoker has stopped smoking for at least 15 years. Yearly screening should be stopped for people who develop a health problem that would prevent them from having lung cancer treatment.  If you choose to drink alcohol, do not have more  than 2 drinks per day. One drink is considered to be 12 ounces (355 mL) of beer, 5 ounces (148 mL) of wine, or 1.5 ounces (44 mL) of liquor.  Avoid use of street drugs. Do not share needles with anyone. Ask for help if you need support or instructions about stopping the use of drugs.  High blood pressure causes heart disease and increases the risk of stroke. Your blood pressure should be checked at least every 1-2 years. Ongoing high blood pressure should be  treated with medicines, if weight loss and exercise are not effective.  If you are 34-90 years old, ask your health care provider if you should take aspirin to prevent heart disease.  Diabetes screening is done by taking a blood sample to check your blood glucose level after you have not eaten for a certain period of time (fasting). If you are not overweight and you do not have risk factors for diabetes, you should be screened once every 3 years starting at age 35. If you are overweight or obese and you are 70-84 years of age, you should be screened for diabetes every year as part of your cardiovascular risk assessment.  Colorectal cancer can be detected and often prevented. Most routine colorectal cancer screening begins at the age of 18 and continues through age 69. However, your health care provider may recommend screening at an earlier age if you have risk factors for colon cancer. On a yearly basis, your health care provider may provide home test kits to check for hidden blood in the stool. Use of a small camera at the end of a tube to directly examine the colon (sigmoidoscopy or colonoscopy) can detect the earliest forms of colorectal cancer. Talk to your health care provider about this at age 71, when routine screening begins. Direct exam of the colon should be repeated every 5-10 years through age 18, unless early forms of precancerous polyps or small growths are found.  People who are at an increased risk for hepatitis B should be screened for this virus. You are considered at high risk for hepatitis B if:  You were born in a country where hepatitis B occurs often. Talk with your health care provider about which countries are considered high risk.  Your parents were born in a high-risk country and you have not received a shot to protect against hepatitis B (hepatitis B vaccine).  You have HIV or AIDS.  You use needles to inject street drugs.  You live with, or have sex with, someone who  has hepatitis B.  You are a man who has sex with other men (MSM).  You get hemodialysis treatment.  You take certain medicines for conditions such as cancer, organ transplantation, and autoimmune conditions.  Hepatitis C blood testing is recommended for all people born from [birth year] through 1965 and any individual with known risks for hepatitis C.  Practice safe sex. Use condoms and avoid high-risk sexual practices to reduce the spread of sexually transmitted infections (STIs). STIs include gonorrhea, chlamydia, syphilis, trichomonas, herpes, HPV, and human immunodeficiency virus (HIV). Herpes, HIV, and HPV are viral illnesses that have no cure. They can result in disability, cancer, and death.  If you are a man who has sex with other men, you should be screened at least once per year for:  HIV.  Urethral, rectal, and pharyngeal infection of gonorrhea, chlamydia, or both.  If you are at risk of being infected with HIV, it is recommended that you take a  prescription medicine daily to prevent HIV infection. This is called preexposure prophylaxis (PrEP). You are considered at risk if:  You are a man who has sex with other men (MSM) and have other risk factors.  You are a heterosexual man, are sexually active, and are at increased risk for HIV infection.  You take drugs by injection.  You are sexually active with a partner who has HIV.  Talk with your health care provider about whether you are at high risk of being infected with HIV. If you choose to begin PrEP, you should first be tested for HIV. You should then be tested every 3 months for as long as you are taking PrEP.  A one-time screening for abdominal aortic aneurysm (AAA) and surgical repair of large AAAs by ultrasound are recommended for men ages 44 to 66 years who are current or former smokers.  Healthy men should no longer receive prostate-specific antigen (PSA) blood tests as part of routine cancer screening. Talk with your health  care provider about prostate cancer screening.  Testicular cancer screening is not recommended for adult males who have no symptoms. Screening includes self-exam, a health care provider exam, and other screening tests. Consult with your health care provider about any symptoms you have or any concerns you have about testicular cancer.  Use sunscreen. Apply sunscreen liberally and repeatedly throughout the day. You should seek shade when your shadow is shorter than you. Protect yourself by wearing long sleeves, pants, a wide-brimmed hat, and sunglasses year round, whenever you are outdoors.  Once a month, do a whole-body skin exam, using a mirror to look at the skin on your back. Tell your health care provider about new moles, moles that have irregular borders, moles that are larger than a pencil eraser, or moles that have changed in shape or color.  Stay current with required vaccines (immunizations).  Influenza vaccine. All adults should be immunized every year.  Tetanus, diphtheria, and acellular pertussis (Td, Tdap) vaccine. An adult who has not previously received Tdap or who does not know his vaccine status should receive 1 dose of Tdap. This initial dose should be followed by tetanus and diphtheria toxoids (Td) booster doses every 10 years. Adults with an unknown or incomplete history of completing a 3-dose immunization series with Td-containing vaccines should begin or complete a primary immunization series including a Tdap dose. Adults should receive a Td booster every 10 years.  Varicella vaccine. An adult without evidence of immunity to varicella should receive 2 doses or a second dose if he has previously received 1 dose.  Human papillomavirus (HPV) vaccine. Males aged 11-21 years who have not received the vaccine previously should receive the 3-dose series. Males aged 22-26 years may be immunized. Immunization is recommended through the age of 23 years for any male who has sex with males  and did not get any or all doses earlier. Immunization is recommended for any person with an immunocompromised condition through the age of 72 years if he did not get any or all doses earlier. During the 3-dose series, the second dose should be obtained 4-8 weeks after the first dose. The third dose should be obtained 24 weeks after the first dose and 16 weeks after the second dose.  Zoster vaccine. One dose is recommended for adults aged 23 years or older unless certain conditions are present.  Measles, mumps, and rubella (MMR) vaccine. Adults born before [birth year] generally are considered immune to measles and mumps. Adults born in [birth year]  or later should have 1 or more doses of MMR vaccine unless there is a contraindication to the vaccine or there is laboratory evidence of immunity to each of the three diseases. A routine second dose of MMR vaccine should be obtained at least 28 days after the first dose for students attending postsecondary schools, health care workers, or international travelers. People who received inactivated measles vaccine or an unknown type of measles vaccine during 1963-1967 should receive 2 doses of MMR vaccine. People who received inactivated mumps vaccine or an unknown type of mumps vaccine before 1979 and are at high risk for mumps infection should consider immunization with 2 doses of MMR vaccine. Unvaccinated health care workers born before [birth year] who lack laboratory evidence of measles, mumps, or rubella immunity or laboratory confirmation of disease should consider measles and mumps immunization with 2 doses of MMR vaccine or rubella immunization with 1 dose of MMR vaccine.  Pneumococcal 13-valent conjugate (PCV13) vaccine. When indicated, a person who is uncertain of his immunization history and has no record of immunization should receive the PCV13 vaccine. All adults 9 years of age and older should receive this vaccine. An adult aged 69 years or older who has certain medical  conditions and has not been previously immunized should receive 1 dose of PCV13 vaccine. This PCV13 should be followed with a dose of pneumococcal polysaccharide (PPSV23) vaccine. Adults who are at high risk for pneumococcal disease should obtain the PPSV23 vaccine at least 8 weeks after the dose of PCV13 vaccine. Adults older than 74 years of age who have normal immune system function should obtain the PPSV23 vaccine dose at least 1 year after the dose of PCV13 vaccine.  Pneumococcal polysaccharide (PPSV23) vaccine. When PCV13 is also indicated, PCV13 should be obtained first. All adults aged 79 years and older should be immunized. An adult younger than age 43 years who has certain medical conditions should be immunized. Any person who resides in a nursing home or long-term care facility should be immunized. An adult smoker should be immunized. People with an immunocompromised condition and certain other conditions should receive both PCV13 and PPSV23 vaccines. People with human immunodeficiency virus (HIV) infection should be immunized as soon as possible after diagnosis. Immunization during chemotherapy or radiation therapy should be avoided. Routine use of PPSV23 vaccine is not recommended for American Indians, [place] Natives, or people younger than 65 years unless there are medical conditions that require PPSV23 vaccine. When indicated, people who have unknown immunization and have no record of immunization should receive PPSV23 vaccine. One-time revaccination 5 years after the first dose of PPSV23 is recommended for people aged 19-64 years who have chronic kidney failure, nephrotic syndrome, asplenia, or immunocompromised conditions. People who received 1-2 doses of PPSV23 before age 70 years should receive another dose of PPSV23 vaccine at age 79 years or later if at least 5 years have passed since the previous dose. Doses of PPSV23 are not needed for people immunized with PPSV23 at or after age 55  years.  Meningococcal vaccine. Adults with asplenia or persistent complement component deficiencies should receive 2 doses of quadrivalent meningococcal conjugate (MenACWY-D) vaccine. The doses should be obtained at least 2 months apart. Microbiologists working with certain meningococcal bacteria, [employer] recruits, people at risk during an outbreak, and people who travel to or live in countries with a high rate of meningitis should be immunized. A first-year college student up through age 64 years who is living in a residence hall should receive a  dose if he did not receive a dose on or after his 16th birthday. Adults who have certain high-risk conditions should receive one or more doses of vaccine.  Hepatitis A vaccine. Adults who wish to be protected from this disease, have chronic liver disease, work with hepatitis A-infected animals, work in hepatitis A research labs, or travel to or work in countries with a high rate of hepatitis A should be immunized. Adults who were previously unvaccinated and who anticipate close contact with an international adoptee during the first 60 days after arrival in the [place] from a country with a high rate of hepatitis A should be immunized.  Hepatitis B vaccine. Adults should be immunized if they wish to be protected from this disease, are under age 36 years and have diabetes, have chronic liver disease, have had more than one sex partner in the past 6 months, may be exposed to blood or other infectious body fluids, are household contacts or sex partners of hepatitis B positive people, are clients or workers in certain care facilities, or travel to or work in countries with a high rate of hepatitis B.  Haemophilus influenzae type b (Hib) vaccine. A previously unvaccinated person with asplenia or sickle cell disease or having a scheduled splenectomy should receive 1 dose of Hib vaccine. Regardless of previous immunization, a recipient of a hematopoietic stem cell  transplant should receive a 3-dose series 6-12 months after his successful transplant. Hib vaccine is not recommended for adults with HIV infection. Preventive Service / Frequency Ages 47 and over  Blood pressure check.** / Every year.  Lipid and cholesterol check.**/ Every 5 years beginning at age 46.  Lung cancer screening. / Every year if you are aged 70-80 years and have a 30-pack-year history of smoking and currently smoke or have quit within the past 15 years. Yearly screening is stopped once you have quit smoking for at least 15 years or develop a health problem that would prevent you from having lung cancer treatment.  Fecal occult blood test (FOBT) of stool. / Every year beginning at age 28 and continuing until age 49. You may not have to do this test if you get a colonoscopy every 10 years.  Flexible sigmoidoscopy** or colonoscopy.** / Every 5 years for a flexible sigmoidoscopy or every 10 years for a colonoscopy beginning at age 70 and continuing until age 70.  Hepatitis C blood test.** / For all people born from [birth year] through 1965 and any individual with known risks for hepatitis C.  Abdominal aortic aneurysm (AAA) screening.** / A one-time screening for ages 16 to 31 years who are current or former smokers.  Skin self-exam. / Monthly.  Influenza vaccine. / Every year.  Tetanus, diphtheria, and acellular pertussis (Tdap/Td) vaccine.** / 1 dose of Td every 10 years.  Varicella vaccine.** / Consult your health care provider.  Zoster vaccine.** / 1 dose for adults aged 70 years or older.  Pneumococcal 13-valent conjugate (PCV13) vaccine.** / 1 dose for all adults aged 92 years and older.  Pneumococcal polysaccharide (PPSV23) vaccine.** / 1 dose for all adults aged 54 years and older.  Meningococcal vaccine.** / Consult your health care provider.  Hepatitis A vaccine.** / Consult your health care provider.  Hepatitis B vaccine.** / Consult your health care  provider.  Haemophilus influenzae type b (Hib) vaccine.** / Consult your health care provider. **Family history and personal history of risk and conditions may change your health care provider's recommendations.   This information is  not intended to replace advice given to you by your health care provider. Make sure you discuss any questions you have with your health care provider.   Document Released: 10/06/2001 Document Revised: 08/31/2014 Document Reviewed: 01/05/2011 Elsevier Interactive Patient Education [employer].

## 2017-10-23 LAB — CBC WITH DIFFERENTIAL/PLATELET
BASOS ABS: 0 10*3/uL (ref 0.0–0.2)
Basos: 1 %
EOS (ABSOLUTE): 0.1 10*3/uL (ref 0.0–0.4)
Eos: 2 %
Hematocrit: 42.9 % (ref 37.5–51.0)
Hemoglobin: 14.3 g/dL (ref 13.0–17.7)
Immature Grans (Abs): 0 10*3/uL (ref 0.0–0.1)
Immature Granulocytes: 0 %
LYMPHS ABS: 1.2 10*3/uL (ref 0.7–3.1)
LYMPHS: 21 %
MCH: 31.6 pg (ref 26.6–33.0)
MCHC: 33.3 g/dL (ref 31.5–35.7)
MCV: 95 fL (ref 79–97)
MONOCYTES: 6 %
Monocytes Absolute: 0.3 10*3/uL (ref 0.1–0.9)
NEUTROS ABS: 4 10*3/uL (ref 1.4–7.0)
Neutrophils: 70 %
PLATELETS: 294 10*3/uL (ref 150–379)
RBC: 4.53 x10E6/uL (ref 4.14–5.80)
RDW: 13.8 % (ref 12.3–15.4)
WBC: 5.7 10*3/uL (ref 3.4–10.8)

## 2017-10-23 LAB — COMPREHENSIVE METABOLIC PANEL
ALK PHOS: 62 IU/L (ref 39–117)
ALT: 40 IU/L (ref 0–44)
AST: 29 IU/L (ref 0–40)
Albumin/Globulin Ratio: 1.5 (ref 1.2–2.2)
Albumin: 4.3 g/dL (ref 3.5–4.8)
BILIRUBIN TOTAL: 0.5 mg/dL (ref 0.0–1.2)
BUN/Creatinine Ratio: 12 (ref 10–24)
BUN: 17 mg/dL (ref 8–27)
CHLORIDE: 107 mmol/L — AB (ref 96–106)
CO2: 19 mmol/L — AB (ref 20–29)
Calcium: 9.3 mg/dL (ref 8.6–10.2)
Creatinine, Ser: 1.46 mg/dL — ABNORMAL HIGH (ref 0.76–1.27)
GFR calc Af Amer: 54 mL/min/{1.73_m2} — ABNORMAL LOW (ref >59)
GFR calc non Af Amer: 47 mL/min/{1.73_m2} — ABNORMAL LOW (ref >59)
GLUCOSE: 110 mg/dL — AB (ref 65–99)
Globulin, Total: 2.9 g/dL (ref 1.5–4.5)
Potassium: 4.6 mmol/L (ref 3.5–5.2)
Sodium: 142 mmol/L (ref 134–144)
Total Protein: 7.2 g/dL (ref 6.0–8.5)

## 2017-10-23 LAB — LIPID PANEL
Chol/HDL Ratio: 3.8 ratio (ref 0.0–5.0)
Cholesterol, Total: 141 mg/dL (ref 100–199)
HDL: 37 mg/dL — ABNORMAL LOW (ref >39)
LDL Calculated: 75 mg/dL (ref 0–99)
Triglycerides: 145 mg/dL (ref 0–149)
VLDL CHOLESTEROL CAL: 29 mg/dL (ref 5–40)

## 2017-10-23 LAB — VITAMIN D 25 HYDROXY (VIT D DEFICIENCY, FRACTURES): VIT D 25 HYDROXY: 18.2 ng/mL — AB (ref 30.0–100.0)

## 2017-10-23 LAB — HEMOGLOBIN A1C
ESTIMATED AVERAGE GLUCOSE: 123 mg/dL
HEMOGLOBIN A1C: 5.9 % — AB (ref 4.8–5.6)

## 2017-10-23 LAB — VITAMIN B12: VITAMIN B 12: 344 pg/mL (ref 232–1245)

## 2017-10-23 LAB — HEPATITIS C ANTIBODY

## 2017-10-23 LAB — TSH: TSH: 3.43 u[IU]/mL (ref 0.450–4.500)

## 2017-10-23 LAB — T4, FREE: FREE T4: 1.6 ng/dL (ref 0.82–1.77)

## 2017-10-23 LAB — HIV ANTIBODY (ROUTINE TESTING W REFLEX): HIV SCREEN 4TH GENERATION: NONREACTIVE

## 2017-10-31 LAB — COLOGUARD

## 2017-11-03 ENCOUNTER — Other Ambulatory Visit: Payer: Self-pay

## 2017-11-03 DIAGNOSIS — E559 Vitamin D deficiency, unspecified: Secondary | ICD-10-CM

## 2017-11-03 MED ORDER — VITAMIN D (ERGOCALCIFEROL) 1.25 MG (50000 UNIT) PO CAPS: 50000.0000 [IU] | ORAL_CAPSULE | ORAL | 3 refills | Status: DC

## 2017-11-03 NOTE — Progress Notes (Signed)
Per recent lab result note Dr. Sharee Holsterpalski instructed Vit D 50,000 units weekly be sent in.  Patient was called and notified.   From result note.  "Vit D, which is too low. Please send in ergocalciferol 50 K international units's 1 p.o. weekly, dispense 12, 3 refills."  MPulliam, CMA/RT(R)

## 2017-11-04 ENCOUNTER — Ambulatory Visit
Admission: RE | Admit: 2017-11-04 | Discharge: 2017-11-04 | Disposition: A | Discharge status: Home / Self Care | Payer: Medicare HMO | Source: Ambulatory Visit | Attending: Family Medicine | Admitting: Family Medicine

## 2017-11-04 DIAGNOSIS — Z87891 Personal history of nicotine dependence: Secondary | ICD-10-CM

## 2017-11-04 DIAGNOSIS — Z122 Encounter for screening for malignant neoplasm of respiratory organs: Secondary | ICD-10-CM

## 2017-11-04 DIAGNOSIS — Z0000 Encounter for general adult medical examination without abnormal findings: Secondary | ICD-10-CM

## 2017-11-04 DIAGNOSIS — J439 Emphysema, unspecified: Secondary | ICD-10-CM

## 2017-11-04 DIAGNOSIS — I779 Disorder of arteries and arterioles, unspecified: Secondary | ICD-10-CM

## 2017-11-04 DIAGNOSIS — Z5320 Procedure and treatment not carried out because of patient's decision for unspecified reasons: Secondary | ICD-10-CM

## 2017-11-05 ENCOUNTER — Encounter: Payer: Self-pay | Admitting: Family Medicine

## 2017-11-05 DIAGNOSIS — IMO0001 Reserved for inherently not codable concepts without codable children: Secondary | ICD-10-CM | POA: Insufficient documentation

## 2017-11-05 DIAGNOSIS — R911 Solitary pulmonary nodule: Secondary | ICD-10-CM | POA: Insufficient documentation

## 2017-11-05 DIAGNOSIS — I700 Atherosclerosis of aorta: Secondary | ICD-10-CM | POA: Insufficient documentation

## 2017-11-09 ENCOUNTER — Telehealth: Payer: Self-pay | Admitting: Cardiology

## 2017-11-09 NOTE — Telephone Encounter (Signed)
Michael [name] is calling because he had a CT Scan on last Thursday and per his PCP that he needed to speak to his Cardiologist because they found a problem with his arteries around his lungs. Please call

## 2017-11-09 NOTE — Telephone Encounter (Signed)
Returned call to patient who reports his PCP recommended he call Dr. Herbie BaltimoreHarding to have him look at his recent CT test. Per report, aortic atherosclerosis was noted. Advised patient that MD is out of the office this week, so it would be next week at the earliest before he would be able to review and report. He is aware that he will get a call back with any advice/follow up needed from Dr. Elissa HeftyHarding's nurse after the MD has reviewed the report. He voiced understanding.

## 2017-11-10 NOTE — Telephone Encounter (Signed)
Spoke to patient. Aware of Dr Herbie BaltimoreHARDING RECOMMENDATION  Keep risk factors under control.  No change with current treatment . Patient verbalized understanding.

## 2017-11-10 NOTE — Telephone Encounter (Signed)
I am not at all surprised that CT scan shows atherosclerotic calcification of both the aorta and the coronary arteries.  This is been well documented in his cath reports and he has multivessel disease status post CABG. Based on this I would not necessarily change anything to be doing differently.  He needs aggressive risk factor modification with lipid management and blood pressure control, but he has been relatively intolerant of lipid management in the past.  Would not change management based upon these somewhat expected results.  Bryan Lemmaavid Harding, MD

## 2017-11-10 NOTE — Telephone Encounter (Signed)
-  Thank you for the heads up on your communication with our mutual patient.     He was never told by me, or my staff to call you regarding his CT scan and the atherosclerosis that was demonstrated.   Please see below notes of what was communicated to the patient regarding this test.   Thanks     Notes recorded by [place] CranePulliam, Melissa D, CMA on 11/08/2017 at 5:17 PM EDT Patient called. Patient aware.. MPulliam, CMA/RT(R)  ------  Notes recorded by Thomasene Lotpalski, Bryana Froemming, DO on 11/05/2017 at 1:06 PM EDT Findings of recent CT:  1) 4.7 cm rounded masslike opacity in the posterior left lower lobe.  - Please tell patient it favors a benign etiology but since he has not had any previous imaging, the radiologist recommended repeat CT scan in 6 months. Please place that order for repeat CT scan 6 mo from now..   2. 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  3. Aortic Atherosclerois  -Tell patient other than repeating the CT scan 6 months from now, this does not change his management otherwise, however I recommend he doing everything he can from a diet and lifestyle standpoint to eat healthier, exercise in order to reduce his risk of heart attack and stroke.

## 2017-11-12 NOTE — Telephone Encounter (Signed)
OK.   Just wanted to make sure that this did not raise any alarms.   Bryan Lemmaavid Harding, MD

## 2017-12-06 ENCOUNTER — Other Ambulatory Visit: Payer: Self-pay | Admitting: Cardiology

## 2017-12-08 ENCOUNTER — Other Ambulatory Visit: Payer: Self-pay | Admitting: Family Medicine

## 2017-12-08 DIAGNOSIS — E039 Hypothyroidism, unspecified: Secondary | ICD-10-CM

## 2017-12-22 ENCOUNTER — Telehealth: Payer: Self-pay | Admitting: Family Medicine

## 2017-12-22 NOTE — Telephone Encounter (Signed)
Patient called to request results of the Cologuard.  -- Forwarding message to medical assistant.  Please call [phone]  --glh

## 2017-12-27 NOTE — Telephone Encounter (Signed)
Called patient and left message to call the office back. MPulliam, CMA/RT(R)  

## 2017-12-27 NOTE — Telephone Encounter (Signed)
Patient notified of the results. MPulliam, CMA/RT(R)

## 2018-01-05 ENCOUNTER — Other Ambulatory Visit: Payer: Self-pay | Admitting: Cardiology

## 2018-03-02 ENCOUNTER — Telehealth: Payer: Self-pay | Admitting: Cardiology

## 2018-03-02 DIAGNOSIS — I739 Peripheral vascular disease, unspecified: Secondary | ICD-10-CM

## 2018-03-02 NOTE — Telephone Encounter (Signed)
New message   Patient requesting order for echo. Please call

## 2018-03-02 NOTE — Telephone Encounter (Signed)
Called patient, he stated he needed the US's ordered before his appointment in October. Patient notified and that someone would contact him to schedule. No questions or concerns.   Orders placed, message sent to scheduling and [facility] to verify it gets scheduled.

## 2018-03-02 NOTE — Telephone Encounter (Signed)
spoke to scheduler okay scheduletest for oct 2019 and [facility].

## 2018-03-10 ENCOUNTER — Other Ambulatory Visit: Payer: Self-pay

## 2018-03-10 ENCOUNTER — Telehealth: Payer: Self-pay | Admitting: Family Medicine

## 2018-03-10 DIAGNOSIS — R911 Solitary pulmonary nodule: Secondary | ICD-10-CM

## 2018-03-10 NOTE — Telephone Encounter (Signed)
Michael [name] I placed order for CT Scan for Sept.  Please let me know if I need to do anything else for this patient. Thanks. MPulliam, CMA/RT(R)

## 2018-03-10 NOTE — Telephone Encounter (Signed)
Patient cld states he was checking to see who he should call to set up Ct scan with [facility] or PCP-- advised would forward message to medical assistant to review orders w/ provider &  Someone would contact him for either our office or [facility] .  --Message to medical assistant unable to locate order for 2nd/ follow-up 6 month CT Scan, but it is noted in pt's chart after provider reviewed results.   --glh

## 2018-04-26 ENCOUNTER — Other Ambulatory Visit: Payer: Self-pay | Admitting: Cardiology

## 2018-04-26 DIAGNOSIS — Z95820 Peripheral vascular angioplasty status with implants and grafts: Secondary | ICD-10-CM

## 2018-04-26 DIAGNOSIS — I739 Peripheral vascular disease, unspecified: Secondary | ICD-10-CM

## 2018-04-28 ENCOUNTER — Ambulatory Visit
Admission: RE | Admit: 2018-04-28 | Discharge: 2018-04-28 | Disposition: A | Discharge status: Home / Self Care | Payer: Medicare HMO | Source: Ambulatory Visit | Attending: Family Medicine | Admitting: Family Medicine

## 2018-04-28 DIAGNOSIS — R911 Solitary pulmonary nodule: Secondary | ICD-10-CM

## 2018-05-05 ENCOUNTER — Encounter (HOSPITAL_COMMUNITY): Payer: Medicare HMO

## 2018-05-24 ENCOUNTER — Ambulatory Visit (INDEPENDENT_AMBULATORY_CARE_PROVIDER_SITE_OTHER): Payer: Medicare HMO | Admitting: Family Medicine

## 2018-05-24 ENCOUNTER — Encounter: Payer: Self-pay | Admitting: Family Medicine

## 2018-05-24 VITALS — BP 146/68 | HR 56 | Ht 67.0 in | Wt 216.9 lb

## 2018-05-24 DIAGNOSIS — J439 Emphysema, unspecified: Secondary | ICD-10-CM

## 2018-05-24 DIAGNOSIS — R7303 Prediabetes: Secondary | ICD-10-CM | POA: Insufficient documentation

## 2018-05-24 DIAGNOSIS — E039 Hypothyroidism, unspecified: Secondary | ICD-10-CM

## 2018-05-24 DIAGNOSIS — N183 Chronic kidney disease, stage 3 unspecified: Secondary | ICD-10-CM | POA: Insufficient documentation

## 2018-05-24 DIAGNOSIS — I25118 Atherosclerotic heart disease of native coronary artery with other forms of angina pectoris: Secondary | ICD-10-CM

## 2018-05-24 DIAGNOSIS — E559 Vitamin D deficiency, unspecified: Secondary | ICD-10-CM | POA: Insufficient documentation

## 2018-05-24 DIAGNOSIS — I10 Essential (primary) hypertension: Secondary | ICD-10-CM

## 2018-05-24 DIAGNOSIS — E785 Hyperlipidemia, unspecified: Secondary | ICD-10-CM

## 2018-05-24 NOTE — Patient Instructions (Addendum)
Told pt to check BP at home and BRING IN LOG NEXT OV.     Risk factors for prediabetes and type 2 diabetes  Researchers don't fully understand why some people develop prediabetes and type 2 diabetes and others don't.  It's clear that certain factors increase the risk, however, including:  Weight. The more fatty tissue you have, the more resistant your cells become to insulin.  Inactivity. The less active you are, the greater your risk. Physical activity helps you control your weight, uses up glucose as energy and makes your cells more sensitive to insulin.  Family history. Your risk increases if a parent or sibling has type 2 diabetes.  Race. Although it's unclear why, people of certain races - including blacks, Hispanics, American Indians and Asian-Americans - are at higher risk.  Age. Your risk increases as you get older. This may be because you tend to exercise less, lose muscle mass and gain weight as you age. But type 2 diabetes is also increasing dramatically among children, adolescents and younger adults.  Gestational diabetes. If you developed gestational diabetes when you were pregnant, your risk of developing prediabetes and type 2 diabetes later increases. If you gave birth to a baby weighing more than 9 pounds (4 kilograms), you're also at risk of type 2 diabetes.  Polycystic ovary syndrome. For women, having polycystic ovary syndrome - a common condition characterized by irregular menstrual periods, excess hair growth and obesity - increases the risk of diabetes.  High blood pressure. Having blood pressure over 140/90 millimeters of mercury (mm Hg) is linked to an increased risk of type 2 diabetes.  Abnormal cholesterol and triglyceride levels. If you have low levels of high-density lipoprotein (HDL), or "good," cholesterol, your risk of type 2 diabetes is higher. Triglycerides are another type of fat carried in the blood. People with high levels of triglycerides have an increased risk  of type 2 diabetes. Your doctor can let you know what your cholesterol and triglyceride levels are.  A good guide to good carbs: The glycemic index ---If you have diabetes, or at risk for diabetes, you know all too well that when you eat carbohydrates, your blood sugar goes up. The total amount of carbs you consume at a meal or in a snack mostly determines what your blood sugar will do. But the food itself also plays a role. A serving of white rice has almost the same effect as eating pure table sugar - a quick, high spike in blood sugar. A serving of lentils has a slower, smaller effect.  ---Picking good sources of carbs can help you control your blood sugar and your weight. Even if you don't have diabetes, eating healthier carbohydrate-rich foods can help ward off a host of chronic conditions, from heart disease to various cancers to, well, diabetes.  ---One way to choose foods is with the glycemic index (GI). This tool measures how much a food boosts blood sugar.  The glycemic index rates the effect of a specific amount of a food on blood sugar compared with the same amount of pure glucose. A food with a glycemic index of 28 boosts blood sugar only 28% as much as pure glucose. One with a GI of 95 acts like pure glucose.    High glycemic foods result in a quick spike in insulin and blood sugar (also known as blood glucose).  Low glycemic foods have a slower, smaller effect- these are healthier for you.   Using the glycemic index Using the  glycemic index is easy: choose foods in the low GI category instead of those in the high GI category (see below), and go easy on those in between. Low glycemic index (GI of 55 or less): Most fruits and vegetables, beans, minimally processed grains, pasta, low-fat dairy foods, and nuts.  Moderate glycemic index (GI 56 to 69): White and sweet potatoes, corn, white rice, couscous, breakfast cereals such as Cream of Wheat and Mini Wheats.  High glycemic index (GI of  70 or higher): White bread, rice cakes, most crackers, bagels, cakes, doughnuts, croissants, most packaged breakfast cereals. You can see the values for 100 commons foods and get links to more at www.health.RecordDebt.hu.  Swaps for lowering glycemic index  Instead of this high-glycemic index food Eat this lower-glycemic index food  White rice Brown rice or converted rice  Instant oatmeal Steel-cut oats  Cornflakes Bran flakes  Baked potato Pasta, bulgur  White bread Whole-grain bread  Corn Peas or leafy greens       Prediabetes Eating Plan  Prediabetes--also called impaired glucose tolerance or impaired fasting glucose--is a condition that causes blood sugar (blood glucose) levels to be higher than normal. Following a healthy diet can help to keep prediabetes under control. It can also help to lower the risk of type 2 diabetes and heart disease, which are increased in people who have prediabetes. Along with regular exercise, a healthy diet:  Promotes weight loss.  Helps to control blood sugar levels.  Helps to improve the way that the body uses insulin.   WHAT DO I NEED TO KNOW ABOUT THIS EATING PLAN?   Use the glycemic index (GI) to plan your meals. The index tells you how quickly a food will raise your blood sugar. Choose low-GI foods. These foods take a longer time to raise blood sugar.  Pay close attention to the amount of carbohydrates in the food that you eat. Carbohydrates increase blood sugar levels.  Keep track of how many calories you take in. Eating the right amount of calories will help you to achieve a healthy weight. Losing about 7 percent of your starting weight can help to prevent type 2 diabetes.  You may want to follow a Mediterranean diet. This diet includes a lot of vegetables, lean meats or fish, whole grains, fruits, and healthy oils and fats.   WHAT FOODS CAN I EAT?  Grains Whole grains, such as whole-wheat or whole-grain breads, crackers,  cereals, and pasta. Unsweetened oatmeal. Bulgur. Barley. Quinoa. Brown rice. Corn or whole-wheat flour tortillas or taco shells. Vegetables Lettuce. Spinach. Peas. Beets. Cauliflower. Cabbage. Broccoli. Carrots. Tomatoes. Squash. Eggplant. Herbs. Peppers. Onions. Cucumbers. Brussels sprouts. Fruits Berries. Bananas. Apples. Oranges. Grapes. Papaya. Mango. Pomegranate. Kiwi. Grapefruit. Cherries. Meats and Other Protein Sources Seafood. Lean meats, such as chicken and [place] or lean cuts of pork and beef. Tofu. Eggs. Nuts. Beans. Dairy Low-fat or fat-free dairy products, such as yogurt, cottage cheese, and cheese. Beverages Water. Tea. Coffee. Sugar-free or diet soda. Seltzer water. Milk. Milk alternatives, such as soy or almond milk. Condiments Mustard. Relish. Low-fat, low-sugar ketchup. Low-fat, low-sugar barbecue sauce. Low-fat or fat-free mayonnaise. Sweets and Desserts Sugar-free or low-fat pudding. Sugar-free or low-fat ice cream and other frozen treats. Fats and Oils Avocado. Walnuts. Olive oil. The items listed above may not be a complete list of recommended foods or beverages. Contact your dietitian for more options.    WHAT FOODS ARE NOT RECOMMENDED?  Grains Refined white flour and flour products, such as bread, pasta,  snack foods, and cereals. Beverages Sweetened drinks, such as sweet iced tea and soda. Sweets and Desserts Baked goods, such as cake, cupcakes, pastries, cookies, and cheesecake. The items listed above may not be a complete list of foods and beverages to avoid. Contact your dietitian for more information.   This information is not intended to replace advice given to you by your health care provider. Make sure you discuss any questions you have with your health care provider.   Document Released: 12/25/2014 Document Reviewed: 12/25/2014 Elsevier Interactive Patient Education 2016 [employer]       Chronic Kidney Disease, Adult Chronic kidney disease  (CKD) occurs when the kidneys become damaged slowly over a long period of time. The kidneys are a pair of organs that do many important jobs in the body, including:  Removing waste and extra fluid from the blood to make urine.  Making hormones that maintain the amount of fluid in tissues and blood vessels.  Maintaining the right amount of fluids and chemicals in the body.  A small amount of kidney damage may not cause problems, but a large amount of damage may make it hard or impossible for the kidneys to work the way they should. If steps are not taken to slow down kidney damage or to stop it from getting worse, the kidneys may stop working permanently (end-stage renal disease or ESRD). Most of the time, CKD does not go away, but it can often be controlled. People who have CKD are usually able to live normal lives. What are the causes? The most common causes of this condition are diabetes and high blood pressure (hypertension). Other causes include:  Heart and blood vessel (cardiovascular) disease.  Kidney diseases, such as: ? Glomerulonephritis. ? Interstitial nephritis. ? Polycystic kidney disease. ? Renal vascular disease.  Diseases that affect the immune system.  Genetic diseases.  Medicines that damage the kidneys, such as anti-inflammatory medicines.  Being around or being in contact with poisonous (toxic) substances.  A kidney or urinary infection that occurs again and again (recurs).  Vasculitis. This is swelling or inflammation of the blood vessels.  A problem with urine flow that may be caused by: ? Cancer. ? Having kidney stones more than one time. ? An enlarged prostate, in males.  What increases the risk? You are more likely to develop this condition if you:  Are older than age 1.  Are male.  Are African-American, Hispanic, Asian, [place]er, or American [place].  Are a current or former smoker.  Are obese.  Have a family history of kidney  disease or failure.  Often take medicines that are damaging to the kidneys.  What are the signs or symptoms? Symptoms of this condition include:  Swelling (edema) of the face, legs, ankles, or feet.  Tiredness (lethargy) and having less energy.  Nausea or vomiting.  Confusion or trouble concentrating.  Problems with urination, such as: ? Painful or burning feeling during urination. ? Decreased urine production. ? Frequent urination, especially at night. ? Bloody urine.  Muscle twitches and cramps, especially in the legs.  Shortness of breath.  Weakness.  Loss of appetite.  Metallic taste in the mouth.  Trouble sleeping.  Dry, itchy skin.  A low blood count (anemia).  Pale lining of the eyelids and surface of the eye (conjunctiva).  Symptoms develop slowly and may not be obvious until the kidney damage becomes severe. It is possible to have kidney disease for years without having any symptoms. How is this  diagnosed? This condition may be diagnosed based on:  Blood tests.  Urine tests.  Imaging tests, such as an ultrasound or CT scan.  A test in which a sample of tissue is removed from the kidneys to be examined under a microscope (kidney biopsy).  These test results will help your health care provider determine how serious the CKD is. How is this treated? There is no cure for most cases of this condition, but treatment usually relieves symptoms and prevents or slows the progression of the disease. Treatment may include:  Making diet changes, which may require you to avoid alcohol, salty foods (sodium), and foods that are high in potassium, calcium, and protein.  Medicines: ? To lower blood pressure. ? To control blood glucose. ? To relieve anemia. ? To relieve swelling. ? To protect your bones. ? To improve the balance of electrolytes in your blood.  Removing toxic waste from the body through types of dialysis, if the kidneys can no longer do their job  (kidney failure).  Managing any other conditions that are causing your CKD or making it worse.  Follow these instructions at home: Medicines  Take over-the-counter and prescription medicines only as told by your health care provider. The dose of some medicines that you take may need to be adjusted.  Do not take any new medicines unless approved by your health care provider. Many medicines can worsen your kidney damage.  Do not take any vitamin and mineral supplements unless approved by your health care provider. Many nutritional supplements can worsen your kidney damage. General instructions  Follow your prescribed diet as told by your health care provider.  Do not use any products that contain nicotine or tobacco, such as cigarettes and e-cigarettes. If you need help quitting, ask your health care provider.  Monitor and track your blood pressure at home. Report changes in your blood pressure as told by your health care provider.  If you are being treated for diabetes, monitor and track your blood sugar (blood glucose) levels as told by your health care provider.  Maintain a healthy weight. If you need help with this, ask your health care provider.  Start or continue an exercise plan. Exercise at least 30 minutes a day, 5 days a week.  Keep your immunizations up to date as told by your health care provider.  Keep all follow-up visits as told by your health care provider. This is important. Where to find more information:  American Association of Kidney Patients: ResidentialShow.is  [employer]: www.kidney.org  [employer]: FightingMatch.com.ee  [facility]: www.lifeoptions.org and www.kidneyschool.org Contact a health care provider if:  Your symptoms get worse.  You develop new symptoms. Get help right away if:  You develop symptoms of ESRD, which include: ? Headaches. ? Numbness in the hands or feet. ? Easy bruising. ? Frequent  hiccups. ? Chest pain. ? Shortness of breath. ? Lack of menstruation, in women.  You have a fever.  You have decreased urine production.  You have pain or bleeding when you urinate. Summary  Chronic kidney disease (CKD) occurs when the kidneys become damaged slowly over a long period of time.  The most common causes of this condition are diabetes and high blood pressure (hypertension).  There is no cure for most cases of this condition, but treatment usually relieves symptoms and prevents or slows the progression of the disease. Treatment may include a combination of medicines and lifestyle changes. This information is not intended to  replace advice given to you by your health care provider. Make sure you discuss any questions you have with your health care provider. Document Released: 05/19/2008 Document Revised: 09/17/2016 Document Reviewed: 09/17/2016 Elsevier Interactive Patient Education  [employer].

## 2018-05-24 NOTE — Progress Notes (Signed)
Impression and Recommendations:    1. Essential hypertension   2. Chronic kidney disease, stage 3, mod decreased GFR (HCC)   3. CAD, multiple vessel - status post CABG: LIMA-LAD, SVG-RPDA; PCI native RCA for 100% SVG-RCA   4. Pulmonary emphysema, unspecified emphysema type ([place])   5. Hyperlipidemia with target LDL less than 70   6. Acquired hypothyroidism   7. Vitamin D deficiency   8. Prediabetes     1. Essential hypertension - Plan: BASIC METABOLIC PANEL WITH GFR -Discussed with the patient regarding her elevated blood pressure and discussed that the goal blood pressure for his age should be 140/90.  -Lifestyle changes such as dash diet and engaging in a regular exercise program discussed with patient.  Educational handouts provided -Ambulatory BP monitoring encouraged. Keep log and bring in next OV -Continue current medication(s).   Also, risks and benefits of medications discussed with patient, including alternative treatments.   Encouraged patient to read drug information handouts to further educate self about the medicine prior to starting it.   2. Hyperlipidemia with target LDL less than 70 -Per prior labs, patient LDL was at 75 in March 2019, discussed with the patient that the goal for his LDL would ideally be 70 or below.   -Advised and recommended the patient being referred to a Nutritionist, to which the patient declined today.  -Dietary changes such as low saturated & trans fat and low carb/ ketogenic diets discussed with patient. Encouraged regular exercise and weight loss when appropriate.  Continue current medication(s).   Also, risks and benefits of medications discussed with patient, including alternative treatments.   Encouraged patient to read drug information handouts to further educate self about the medicine prior to starting it.   3. CAD, multiple vessel - status post CABG: LIMA-LAD, SVG-RPDA; PCI native RCA for 100% SVG-RCA -Maintain follow up with  Cardiologist as scheduled.   4. Acquired hypothyroidism - Plan: TSH+T4F+T3Free Patient continuing on Synthroid without any issues. Advised the patient to continue with this medication as prescribed.   5. Vitamin D deficiency - Plan: VITAMIN D 25 Hydroxy (Vit-D Deficiency, Fractures) -Discussed importance of vitamin D (as well as calcium) to their health and well-being. We reviewed possible symptoms of low Vitamin D including low energy, muscle aches, joint aches etc.  Weekly and/or daily prescriptions discussed with patient.  See med list. -Patient to continue on medications as prescribed.  -Will check vitamin D lab today.    6. Prediabetes - Plan: Hemoglobin A1c -Discussed with patient regarding his most recent Hemoglobin A1C from March that was mildly elevated at 5.9 (see labs below). -Counseled patient on pathophysiology of disease and discussed various treatment options, which often includes dietary and lifestyle modifications as first line.  Importance of low carb/ketogenic diet discussed with patient in addition to regular exercise.  -We will continue to monitor his hemoglobin A1c with labs drawn today.  Lab Results  Component Value Date   HGBA1C 5.9 (H) 10/22/2017   HGBA1C 5.6 06/08/2011    7. COPD:  -Patient using oxygen during the day only.  -Discussed with the patient to maintain follow up with his Pulmonologist as scheduled.    Will check vitamin D, TSH, and Hemoglobin A1c today.   Follow up in 6 months for chronic follow up  -which is the soonest I can get him to return as patient would prefer once yearly   Education and routine counseling performed. Handouts provided.  Orders Placed This Encounter  Procedures  . VITAMIN D 25 Hydroxy (Vit-D Deficiency, Fractures)  . Hemoglobin A1c  . TSH+T4F+T3Free  . Basic Metabolic Panel (BMET)    The patient was counseled, risk factors were discussed, anticipatory guidance given.  Gross side effects, risk and benefits, and  alternatives of medications discussed with patient.  Patient is aware that all medications have potential side effects and we are unable to predict every side effect or drug-drug interaction that may occur.  Expresses verbal understanding and consents to current therapy plan and treatment regimen.  Return for Follow-up 26-monthor sooner if needed.  Please see AVS handed out to patient at the end of our visit for further patient instructions/ counseling done pertaining to today's office visit.    Note:  This document was prepared using Dragon voice recognition software and may include unintentional dictation errors.  This document serves as a record of services personally performed by DMellody Dance DO. It was created on her behalf by SSteva Colder a trained medical scribe. The creation of this record is based on the scribe's personal observations and the provider's statements to them.   I have reviewed the above medical documentation for accuracy and completeness and I concur.  DMellody Dance10/01/19 9:34 AM    Subjective:    HPI: Michael Zagamiis a 72y.o. male [facility] [place]  He has been doing well overall. He notes that he has had 4 heart attacks in the past with stent placement.   Vitamin D Deficiency:  He is compliant with taking his vitamin D medication every Friday as prescribed. However, he is unable to tell the difference with this treatment.   Thyroid:  He notes that he hasn't had any energy within the past 10 years, however, he has been compliant with his medication.   Cholesterol:  He is compliant with his medications. However, he notes that he still consumes an unhealthy diet and hasn't changed his diet.    COPD:  He notes that he has been using his oxygen machine during the day only. His breathing has been stable thus far. He is still able to cut his grass every week without any  issues. He denies increased swelling to his LE.   HTN:  -  He is unsure if his blood pressure has been controlled at home. Pt has not been checking it regularly due to not having a blood pressure cuff. He is  - Patient reports good compliance with blood pressure medications  - Denies medication S-E   - Smoking Status noted   - He denies new onset of: chest pain, exercise intolerance, shortness of breath, dizziness, visual changes, headache, lower extremity swelling or claudication.    Last 3 blood pressure readings in our office are as follows: BP Readings from Last 3 Encounters:  05/24/18 (!) 146/68  10/22/17 132/74  06/21/17 124/78    Pulse Readings from Last 3 Encounters:  05/24/18 (!) 56  10/22/17 61  06/21/17 72    Filed Weights   05/24/18 0859  Weight: 216 lb 14.4 oz (98.4 kg)      Patient Care Team    Relationship Specialty Notifications Start End  OMellody Dance DO PCP - General Family Medicine  05/24/17   HLeonie Man MD Consulting Physician Cardiology  05/24/17      Lab Results  Component Value Date   CREATININE 1.46 (H) 10/22/2017   BUN 17 10/22/2017  NA 142 10/22/2017   K 4.6 10/22/2017   CL 107 (H) 10/22/2017   CO2 19 (L) 10/22/2017    Lab Results  Component Value Date   CHOL 141 10/22/2017   CHOL 143 06/21/2017   CHOL 154 05/16/2013    Lab Results  Component Value Date   HDL 37 (L) 10/22/2017   HDL 37 (L) 06/21/2017   HDL 32 (L) 05/16/2013    Lab Results  Component Value Date   LDLCALC 75 10/22/2017   LDLCALC 72 06/21/2017   LDLCALC 61 05/16/2013    Lab Results  Component Value Date   TRIG 145 10/22/2017   TRIG 169 (H) 06/21/2017   TRIG 305 (H) 05/16/2013    Lab Results  Component Value Date   CHOLHDL 3.8 10/22/2017   CHOLHDL 3.9 06/21/2017   CHOLHDL 4.8 05/16/2013    No results found for: LDLDIRECT ===================================================================   Patient Active Problem List    Diagnosis Date Noted  . History of tobacco abuse- 2+ppd for 50+ yrs- quit in Jun 08 2011 05/24/2017    Priority: High  . Hyperlipidemia with target LDL less than 70 05/21/2013    Priority: High  . Essential hypertension     Priority: High    Class: Diagnosis of  . Hypothyroidism     Priority: High    Class: Diagnosis of  . COPD (chronic obstructive pulmonary disease) ([place]) 06/10/2011    Priority: High    Class: Diagnosis of  . Lung nodule < 6cm on CT 10/2017-   Post L Lower Lobe 11/05/2017    Priority: Medium  . Chronic respiratory failure with hypoxia (HCC) - SaO2 drops to 87% with ambulation 06/18/2015    Priority: Medium  . Atherosclerosis of native artery of extremity ([place]) 12/10/2011    Priority: Medium  . Peripheral arterial occlusive disease ([place]) 10/22/2011    Priority: Medium  . CAD, multiple vessel - status post CABG: LIMA-LAD, SVG-RPDA; PCI native RCA for 100% SVG-RCA 06/08/2011    Priority: Medium    Class: History of  . Aortic atherosclerosis (HCC)-found on CT scan done March 2019 11/05/2017    Priority: Low  . Colonoscopy refused 05/24/2017    Priority: Low  . PAD (peripheral artery disease) - Right Common Iliac Occlusion, Left Common Iliac 80+% occlusion 10/22/2011    Priority: Low    Class: Diagnosis of  . Prediabetes 05/24/2018  . Vitamin D deficiency 05/24/2018  . Chronic kidney disease, stage 3, mod decreased GFR (HCC) 05/24/2018  . Hyperkalemia 07/09/2017  . CAD (coronary atherosclerotic disease) 05/24/2017  . Noncompliance with immunization regimen 05/24/2017  . Personal history of noncompliance with medical treatment 05/24/2017  . Presence of drug coated stent in right coronary artery 11/24/2016  . Abnormal nuclear stress test 11/12/2016  . HOH (hard of hearing) 11/25/2015  . Encounter for long-term (current) use of other medications 05/21/2013  . Atherosclerosis 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 12/10/2011  . S/P  CABG x 2 06/10/2011    Class: History of  . Multiple vessel coronary artery disease 06/08/2011     Past Medical History:  Diagnosis Date  . Arthritis    "right hip; right wrist" (11/24/2016)  . CAD, multiple vessel 05/2011   90% mid LAD with diffuse proximal stenosis, 100% proximal RCA --> s/p CABG x 2 (LIMA-LAD, SVG-dRCA); b) Lexiscan Myoview 09/2016 w/ Inferior-Inferolateral defect -> Cath:  11/24/16: 100% CTO of SVG-RCA (but nagtive /RCA only subtotal occlusion) --> PCI with DES  x2 (resolute onyx DES 2.25 mm x 30 mm, 2.5 mm x 20 mm) RCA  . Carotid artery occlusion   . COPD (chronic obstructive pulmonary disease) with emphysema ([place])   . Essential hypertension   . History of gout   . History of tobacco abuse    quit 05/2011  . Hyperlipidemia with target LDL less than 70   . Hypothyroidism   . MI (myocardial infarction) ([place]) 05/2011   "I had 3 in 2 days"  . NSTEMI (non-ST elevated myocardial infarction) ([place]) 06/05/2011   With CHF - probably late presentation for missed STEMI --> CATH- MV CAD;; Echo 07/2001: EF > 55%, mild MAC, mild TR, no WMA  with Post-op Setpal dyskinesis, [place] not seen)  . PAD (peripheral artery disease) ([place]) 06/08/2011   a) R Common Iliac 100%, L Common Iliac 80+% occlusion; b) L CIA - Aterectomy- 10 mm x 40 mm self-extending Stent (postdilated to 7 mm with 20-30% residual)--> Deferred R AoFem bypass;; c) LEA Dopplers 04/2012 & 04/2014 - RABI 0.8 & LABI 1.1 - STABLE, no change  . Pneumonia 1940s X 1  . Presence of drug coated stent in right coronary artery 11/24/2016   p-mRCA: Resolute Onyx DES 2.25 mm x 38 mm --> 2.5 mm x 18 mm (post-dilated from 2.7-2.8 mm)     Past Surgical History:  Procedure Laterality Date  . CARDIAC CATHETERIZATION  06/09/2011  . CORONARY ANGIOPLASTY WITH STENT PLACEMENT  11/24/2016   with 100% SVG-RCA & flow in native RCA now present --> PCI-p-m RCA 2 Overlapping Resolute DES (2.25 x 38, 1.5 x 18)  . CORONARY ARTERY BYPASS GRAFT   06/10/2011   oLIMA to LAD, SVG to PDA (Dr. Servando Snare)  . CORONARY STENT INTERVENTION N/A 11/24/2016   Procedure: Coronary Stent Intervention;  Surgeon: Leonie Man, MD;  Location: [facility] INVASIVE CV LAB: [place] 2.25X38 & overlapping DES RESOLUTE ONYX 2.5X18   . ILIAC ARTERY STENT  10/27/2011   left CIA - 48mx40mm self-expanding stent, decreasing downt to 20-30% lesion, 100% occluded R CIA with extensive lumbar collateralization to R internal iliac;  05/2016: Ao-Ileac & LEA Dopplers: RABI 0.8, LABI 1.2. B TBI 0.77 -- he has known B disease s/p L CIA stent. RLABI decreased and LABI is normal; aortoiliac atherosclerosis w/ CTO of R CIA but normal REIA.  .Marland KitchenLEFT HEART CATH AND CORS/GRAFTS ANGIOGRAPHY N/A 11/24/2016   Procedure: LEFT HEART CATH AND CORS/GRAFTS ANGIOGRAPHY;  Surgeon: DLeonie Man MD;  Location: [facility] INVASIVE CV LAB: 100% SVG-RCA, but now native RCA only sub-CTO with antegrade flow.  85% mLAD after D1. 40% oOM1 --> PCI of Native RCA  . NM MYOVIEW LTD  09/2016    EF 62%. Medium sized, moderate severity defect in the basal inferior, mid inferoseptal and mid inferior walls is partially reversible. - INTERMEDIATE RISK -- CATH  . PERCUTANEOUS STENT INTERVENTION Left 10/27/2011   Procedure: PERCUTANEOUS STENT INTERVENTION;  Surgeon: JLorretta Harp MD;  [facility];  Service: Cardiovascular;  Laterality: Left;  . TONSILLECTOMY  ~ 1963  . TRANSTHORACIC ECHOCARDIOGRAM  08/19/2011   EF=>55%; mild mitral annular calcif; mild TR     Family History  Problem Relation Age of Onset  . Heart disease Mother        valve replacement  . Diabetes Mother   . Heart failure Mother   . CAD Sister        cardiac stent  . Stroke Sister  stents in neck  . Pneumonia Father      Social History   Substance and Sexual Activity  Drug Use No  ,  Social History   Substance and Sexual Activity  Alcohol Use No   Comment: 11/24/2016 "nothing in 2 1/2 years; never an alcoholic"  ,   Social History   Tobacco Use  Smoking Status Former Smoker  . Packs/day: 3.00  . Years: 50.00  . Pack years: 150.00  . Types: Cigarettes  . Last attempt to quit: 06/08/2011  . Years since quitting: 6.9  Smokeless Tobacco Former Systems developer  . Types: Chew  . Quit date: 07/13/2003  ,    Current Outpatient Medications on File Prior to Visit  Medication Sig Dispense Refill  . atorvastatin (LIPITOR) 20 MG tablet TAKE 1 TABLET BY MOUTH ALTERNATING WITH TWO TABLETS EVERY OTHER DAY AT BEDTIME 180 tablet 3  . clopidogrel (PLAVIX) 75 MG tablet TAKE 1 TABLET BY MOUTH DAILY 90 tablet 2  . fenofibrate (TRICOR) 48 MG tablet TAKE 1 TABLET BY MOUTH DAILY 90 tablet 2  . levothyroxine (SYNTHROID, LEVOTHROID) 88 MCG tablet TAKE 1 TABLET BY MOUTH DAILY BEFORE BREAKFAST 90 tablet 1  . metoprolol tartrate (LOPRESSOR) 25 MG tablet TAKE 1 TABLET BY MOUTH TWICE DAILY 60 tablet 11  . nitroGLYCERIN (NITROSTAT) 0.4 MG SL tablet Place 1 tablet (0.4 mg total) under the tongue every 5 (five) minutes as needed. 25 tablet 3  . Vitamin D, Ergocalciferol, (DRISDOL) 50000 units CAPS capsule Take 1 capsule (50,000 Units total) by mouth every 7 (seven) days. 12 capsule 3   No current facility-administered medications on file prior to visit.      Allergies  Allergen Reactions  . Ace Inhibitors Cough     Review of Systems:   General:  Denies fever, chills Optho/Auditory:   Denies visual changes, blurred vision Respiratory:   Denies SOB, cough, wheeze, DIB  Cardiovascular:   Denies chest pain, palpitations, painful respirations Gastrointestinal:   Denies nausea, vomiting, diarrhea.  Endocrine:     Denies new hot or cold intolerance Musculoskeletal:  Denies joint swelling, gait issues, or new unexplained myalgias/ arthralgias Skin:  Denies rash, suspicious lesions  Neurological:    Denies dizziness, unexplained weakness, numbness  Psychiatric/Behavioral:   Denies mood changes  Objective:    Blood pressure (!)  146/68, pulse (!) 56, height _0  (1.702 m), weight 216 lb 14.4 oz (98.4 kg), SpO2 93 %.  Body mass index is 33.97 kg/m.  General: Well Developed, well nourished, and in no acute distress.  HEENT: Normocephalic, atraumatic, pupils equal round reactive to light, neck supple, No carotid bruits, no JVD Skin: Warm and dry, cap RF less 2 sec Cardiac: Regular rate and rhythm, S1, S2 WNL's, no murmurs rubs or gallops Respiratory: Decreased aeration globally with fine crackle and fine rhonchorous breath sounds, Not using accessory muscles, speaking in full sentences. NeuroM-Sk: Ambulates w/o assistance, moves ext * 4 w/o difficulty, sensation grossly intact.  Ext: scant edema b/l lower ext Psych: No HI/SI, judgement and insight good, Euthymic mood. Full Affect.

## 2018-05-25 ENCOUNTER — Other Ambulatory Visit: Payer: Self-pay

## 2018-05-25 DIAGNOSIS — E039 Hypothyroidism, unspecified: Secondary | ICD-10-CM

## 2018-05-25 LAB — BASIC METABOLIC PANEL
BUN / CREAT RATIO: 11 (ref 10–24)
BUN: 16 mg/dL (ref 8–27)
CO2: 24 mmol/L (ref 20–29)
Calcium: 9.7 mg/dL (ref 8.6–10.2)
Chloride: 103 mmol/L (ref 96–106)
Creatinine, Ser: 1.46 mg/dL — ABNORMAL HIGH (ref 0.76–1.27)
GFR calc Af Amer: 54 mL/min/{1.73_m2} — ABNORMAL LOW (ref >59)
GFR, EST NON AFRICAN AMERICAN: 47 mL/min/{1.73_m2} — AB (ref >59)
Glucose: 99 mg/dL (ref 65–99)
POTASSIUM: 5.1 mmol/L (ref 3.5–5.2)
SODIUM: 142 mmol/L (ref 134–144)

## 2018-05-25 LAB — HEMOGLOBIN A1C
ESTIMATED AVERAGE GLUCOSE: 120 mg/dL
Hgb A1c MFr Bld: 5.8 % — ABNORMAL HIGH (ref 4.8–5.6)

## 2018-05-25 LAB — VITAMIN D 25 HYDROXY (VIT D DEFICIENCY, FRACTURES): Vit D, 25-Hydroxy: 38.8 ng/mL (ref 30.0–100.0)

## 2018-05-25 LAB — TSH+T4F+T3FREE
Free T4: 1.85 ng/dL — ABNORMAL HIGH (ref 0.82–1.77)
T3 FREE: 2.7 pg/mL (ref 2.0–4.4)
TSH: 0.374 u[IU]/mL — AB (ref 0.450–4.500)

## 2018-05-25 MED ORDER — LEVOTHYROXINE SODIUM 75 MCG PO TABS: 75.0000 ug | ORAL_TABLET | Freq: Every day | ORAL | 1 refills | Status: DC

## 2018-06-10 ENCOUNTER — Ambulatory Visit (HOSPITAL_COMMUNITY)
Admission: RE | Admit: 2018-06-10 | Discharge: 2018-06-10 | Disposition: A | Discharge status: Home / Self Care | Payer: Medicare HMO | Source: Ambulatory Visit | Attending: Cardiovascular Disease | Admitting: Cardiovascular Disease

## 2018-06-10 ENCOUNTER — Ambulatory Visit (HOSPITAL_BASED_OUTPATIENT_CLINIC_OR_DEPARTMENT_OTHER)
Admission: RE | Admit: 2018-06-10 | Discharge: 2018-06-10 | Disposition: A | Discharge status: Home / Self Care | Payer: Medicare HMO | Source: Ambulatory Visit | Attending: Cardiology | Admitting: Cardiology

## 2018-06-10 DIAGNOSIS — Z95820 Peripheral vascular angioplasty status with implants and grafts: Secondary | ICD-10-CM

## 2018-06-10 DIAGNOSIS — I739 Peripheral vascular disease, unspecified: Secondary | ICD-10-CM

## 2018-06-16 ENCOUNTER — Telehealth: Payer: Self-pay | Admitting: *Deleted

## 2018-06-16 DIAGNOSIS — I739 Peripheral vascular disease, unspecified: Secondary | ICD-10-CM

## 2018-06-16 DIAGNOSIS — I779 Disorder of arteries and arterioles, unspecified: Secondary | ICD-10-CM

## 2018-06-16 NOTE — Telephone Encounter (Signed)
-----   Message from Marykay Lex, MD sent at 06/13/2018  5:08 PM EDT ----- Bilateral lower extremity arterial Dopplers would suggest no significant disease in the lower legs.  Bryan Lemma, MD

## 2018-06-16 NOTE — Telephone Encounter (Signed)
Late entry - called patient 06/15/18- left message - for result . Will follow up at appointment 06/22/18.

## 2018-06-16 NOTE — Telephone Encounter (Signed)
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  Stable.  Follow-up in roughly 1 year.  Bryan Lemma, MD

## 2018-06-22 ENCOUNTER — Ambulatory Visit: Payer: Medicare HMO | Admitting: Cardiology

## 2018-06-22 ENCOUNTER — Encounter: Payer: Self-pay | Admitting: Cardiology

## 2018-06-22 VITALS — BP 126/60 | HR 77 | Ht 67.0 in | Wt 219.4 lb

## 2018-06-22 DIAGNOSIS — E785 Hyperlipidemia, unspecified: Secondary | ICD-10-CM | POA: Diagnosis not present

## 2018-06-22 DIAGNOSIS — J9611 Chronic respiratory failure with hypoxia: Secondary | ICD-10-CM

## 2018-06-22 DIAGNOSIS — I739 Peripheral vascular disease, unspecified: Secondary | ICD-10-CM | POA: Diagnosis not present

## 2018-06-22 DIAGNOSIS — I10 Essential (primary) hypertension: Secondary | ICD-10-CM

## 2018-06-22 DIAGNOSIS — I25118 Atherosclerotic heart disease of native coronary artery with other forms of angina pectoris: Secondary | ICD-10-CM | POA: Diagnosis not present

## 2018-06-22 DIAGNOSIS — Z955 Presence of coronary angioplasty implant and graft: Secondary | ICD-10-CM

## 2018-06-22 NOTE — Patient Instructions (Signed)
Medication Instructions:  NOT NEEDED If you need a refill on your cardiac medications before your next appointment, please call your pharmacy.   Lab work: NOT NEEDED If you have labs (blood work) drawn today and your tests are completely normal, you will receive your results only by: Marland Kitchen MyChart Message (if you have MyChart) OR . A paper copy in the mail If you have any lab test that is abnormal or we need to change your treatment, we will call you to review the results.  Testing/Procedures: NOT NEEDED  Follow-Up: At [facility], you and your health needs are our priority.  As part of our continuing mission to provide you with exceptional heart care, we have created designated Provider Care Teams.  These Care Teams include your primary Cardiologist (physician) and Advanced Practice Providers (APPs -  Physician Assistants and Nurse Practitioners) who all work together to provide you with the care you need, when you need it. You will need a follow up appointment in 12 months.  Please call our office 2 months in advance to schedule this appointment.  You may see Bryan Lemma, MD or one of the following Advanced Practice Providers on your designated Care Team:   Theodore Demark, PA-C . Joni Reining, DNP, ANP  Any Other Special Instructions Will Be Listed Below (If Applica

## 2018-06-22 NOTE — Progress Notes (Signed)
PCP: Mellody Dance, DO  Clinic Note: Chief Complaint  Patient presents with  . Follow-up    Still short of breath, but nothing new  . Coronary Artery Disease    No angina  . PAD    No claudication    HPI: Michael [name] is a 72 y.o. male with a PMH below who presents today for annual follow-up of CAD, PAD and cardiomyopathy.Michael [name] was last seen on June 21, 2017.  [place] as usual.  Continues indicate that his breathing problems all started after we did surgery.  He noted loss of the initial effect of post PCI benefit as far as exertional dyspnea.  Rarely uses oxygen unless he is out and about.  Very much limited by dyspnea.  No angina or claudication.  No PND orthopnea.  No edema  Recent Hospitalizations: None  Studies Personally Reviewed - (if available, images/films reviewed: From Epic Chart or Care Everywhere)  06/10/2018 ABIs/duplex: Right 0.83, left 1.02.  Normal TBI's bilaterally.  Suggest patent left iliac stent and no significant extremity disease -adequate lower extremity perfusion with right common iliac occlusion..  Interval History: Michael [name] returns today again pretty much with no major new complaints.  He still is limited with dyspnea.  He does not use nighttime oxygen anymore but does use oxygen if he is out and about.  He denies any PND orthopnea, and has no significant edema.  He does not have any anginal or claudication symptoms at rest or with exertion. He denies any rapid irregular heartbeats or palpitations.  No syncope/near syncope or TIA/amaurosis fugax.  He said about a month or so ago he had an episode where he felt poorly and was little bit concerned so went to the fire department and they did EKG that did not suggest heart attack.  His most notable complaint is that he just cannot do anything because of dyspnea, but acknowledges that this is related to his lung disease and not his heart disease because he is not having any of the MI or heart failure  type symptoms.  No claudication.  ROS: A comprehensive was performed. Review of Systems  Constitutional: Positive for malaise/fatigue (Does not have much energy to do things, because he is breathing so hard).  HENT: Negative for congestion and nosebleeds.   Respiratory: Positive for cough (Only in the mornings but much less since he quit smoking), shortness of breath and wheezing.   Gastrointestinal: Negative for blood in stool, heartburn and melena.  Genitourinary: Negative for dysuria and hematuria.  Musculoskeletal: Positive for joint pain. Negative for myalgias.  Neurological: Positive for dizziness (Sometimes if he stands up too fast).  Psychiatric/Behavioral: Negative for depression and memory loss. The patient is not nervous/anxious and does not have insomnia.   All other systems reviewed and are negative.   I have reviewed and (if needed) personally updated the patient's problem list, medications, allergies, past medical and surgical history, social and family history.   Past Medical History:  Diagnosis Date  . Arthritis    "right hip; right wrist" (11/24/2016)  . CAD, multiple vessel 05/2011   90% mid LAD with diffuse proximal stenosis, 100% proximal RCA --> s/p CABG x 2 (LIMA-LAD, SVG-dRCA); b) Lexiscan Myoview 09/2016 w/ Inferior-Inferolateral defect -> Cath:  11/24/16: 100% CTO of SVG-RCA (but nagtive /RCA only subtotal occlusion) --> PCI with DES x2 (resolute onyx DES 2.25 mm x 30 mm, 2.5 mm x 20 mm) RCA  . Carotid artery occlusion   . 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 Essential hypertension   . History of gout   . History of tobacco abuse    quit 05/2011  . Hyperlipidemia with target LDL less than 70   . Hypothyroidism   . MI (myocardial infarction) ([place]) 05/2011   "I had 3 in 2 days"  . NSTEMI (non-ST elevated myocardial infarction) ([place]) 06/05/2011   With CHF - probably late presentation for missed STEMI --> CATH- MV CAD;; Echo  07/2001: EF > 55%, mild MAC, mild TR, no WMA  with Post-op Setpal dyskinesis, [place] not seen)  . PAD (peripheral artery disease) ([place]) 06/08/2011   a) R Common Iliac 100%, L Common Iliac 80+% occlusion; b) L CIA - Aterectomy- 10 mm x 40 mm self-extending Stent (postdilated to 7 mm with 20-30% residual)--> Deferred R AoFem bypass;; c) LEA Dopplers 04/2012 & 04/2014 - RABI 0.8 & LABI 1.1 - STABLE, no change  . Pneumonia 1940s X 1  . Presence of drug coated stent in right coronary artery 11/24/2016   p-mRCA: Resolute Onyx DES 2.25 mm x 38 mm --> 2.5 mm x 18 mm (post-dilated from 2.7-2.8 mm)    Past Surgical History:  Procedure Laterality Date  . CARDIAC CATHETERIZATION  06/09/2011  . CORONARY ANGIOPLASTY WITH STENT PLACEMENT  11/24/2016   with 100% SVG-RCA & flow in native RCA now present --> PCI-p-m RCA 2 Overlapping Resolute DES (2.25 x 38, 1.5 x 18)  . CORONARY ARTERY BYPASS GRAFT  06/10/2011   oLIMA to LAD, SVG to PDA (Dr. Servando Snare)  . CORONARY STENT INTERVENTION N/A 11/24/2016   Procedure: Coronary Stent Intervention;  Surgeon: Leonie Man, MD;  [facility] INVASIVE CV LAB: [place] 2.25X38 & overlapping DES RESOLUTE ONYX 2.5X18   . ILIAC ARTERY STENT  10/27/2011   left CIA - 28mx40mm self-expanding stent, decreasing downt to 20-30% lesion, 100% occluded R CIA with extensive lumbar collateralization to R internal iliac;  05/2016: Ao-Ileac & LEA Dopplers: RABI 0.8, LABI 1.2. B TBI 0.77 -- he has known B disease s/p L CIA stent. RLABI decreased and LABI is normal; aortoiliac atherosclerosis w/ CTO of R CIA but normal REIA.  .Marland KitchenLEFT HEART CATH AND CORS/GRAFTS ANGIOGRAPHY N/A 11/24/2016   Procedure: LEFT HEART CATH AND CORS/GRAFTS ANGIOGRAPHY;  Surgeon: DLeonie Man MD;  Location: [facility] INVASIVE CV LAB: 100% SVG-RCA, but now native RCA only sub-CTO with antegrade flow.  85% mLAD after D1. 40% oOM1 --> PCI of Native RCA  . NM MYOVIEW LTD  09/2016    EF 62%. Medium sized, moderate severity  defect in the basal inferior, mid inferoseptal and mid inferior walls is partially reversible. - INTERMEDIATE RISK -- CATH  . PERCUTANEOUS STENT INTERVENTION Left 10/27/2011   Procedure: PERCUTANEOUS STENT INTERVENTION;  Surgeon: JLorretta Harp MD;  [facility];  Service: Cardiovascular;  Laterality: Left;  . TONSILLECTOMY  ~ 1963  . TRANSTHORACIC ECHOCARDIOGRAM  08/19/2011   EF=>55%; mild mitral annular calcif; mild TR   April 2018    Current Meds  Medication Sig  . atorvastatin (LIPITOR) 20 MG tablet TAKE 1 TABLET BY MOUTH ALTERNATING WITH TWO TABLETS EVERY OTHER DAY AT BEDTIME  . clopidogrel (PLAVIX) 75 MG tablet TAKE 1 TABLET BY MOUTH DAILY  . fenofibrate (TRICOR) 48 MG tablet TAKE 1 TABLET BY MOUTH DAILY  . levothyroxine (SYNTHROID, LEVOTHROID) 75 MCG tablet Take 1 tablet (75 mcg total) by mouth daily.  . metoprolol tartrate (LOPRESSOR) 25 MG  tablet TAKE 1 TABLET BY MOUTH TWICE DAILY  . Vitamin D, Ergocalciferol, (DRISDOL) 50000 units CAPS capsule Take 1 capsule (50,000 Units total) by mouth every 7 (seven) days.    Allergies  Allergen Reactions  . Ace Inhibitors Cough    Social History   Tobacco Use  . Smoking status: Former Smoker    Packs/day: 3.00    Years: 50.00    Pack years: 150.00    Types: Cigarettes    Last attempt to quit: 06/08/2011    Years since quitting: 7.0  . Smokeless tobacco: Former Systems developer    Types: Chew    Quit date: 07/13/2003  Substance Use Topics  . Alcohol use: No    Comment: 11/24/2016 "nothing in 2 1/2 years; never an alcoholic"  . Drug use: No   Social History   Social History Narrative  . Not on file    family history includes CAD in his sister; Diabetes in his mother; Heart disease in his mother; Heart failure in his mother; Pneumonia in his father; Stroke in his sister.  Wt Readings from Last 3 Encounters:  06/22/18 219 lb 6.4 oz (99.5 kg)  05/24/18 216 lb 14.4 oz (98.4 kg)  10/22/17 219 lb 12.8 oz (99.7 kg)     PHYSICAL EXAM BP 126/60   Pulse 77   Ht _0  (1.702 m)   Wt 219 lb 6.4 oz (99.5 kg)   BMI 34.36 kg/m  Physical Exam  Constitutional: He is oriented to person, place, and time. He appears well-developed and well-nourished.  Truncal obesity.  Well-groomed.  No acute distress  HENT:  Head: Normocephalic and atraumatic.  Neck: Normal range of motion. Neck supple. No hepatojugular reflux and no JVD present. Carotid bruit is present (Soft bilateral). No tracheal deviation present.  Cardiovascular: Normal rate, regular rhythm, S1 normal and S2 normal.  No extrasystoles are present. PMI is not displaced. Exam reveals distant heart sounds and decreased pulses (Mildly decreased right pedal pulses but palpable). Exam reveals no gallop and no friction rub.  No murmur heard. Pulmonary/Chest: Effort normal. He exhibits no tenderness.  Distant breath/diminished sounds bilaterally.  Increased AP diameter.  Mild diffuse interstitial crackles but no wheezes, rales or rhonchi. Mild baseline accessory muscle use but nonlabored.  Abdominal: Soft. Bowel sounds are normal. He exhibits no distension. There is no tenderness.  Protuberant, obese abdomen but no HSM  Musculoskeletal: Normal range of motion. He exhibits no edema.  Neurological: He is alert and oriented to person, place, and time.  Psychiatric: He has a normal mood and affect. His behavior is normal. Judgment and thought content normal.  Vitals reviewed.   Adult ECG Report  Rate: 77;  Rhythm: normal sinus rhythm, premature ventricular contractions (PVC) and Anterior MI, age undetermined.  Borderline pulmonary pattern with rightward axis.  Otherwise normal intervals durations.;   Narrative Interpretation: Stable EKG  Other studies Reviewed: Additional studies/ records that were reviewed today include:  Recent Labs:   Lab Results  Component Value Date   CHOL 141 10/22/2017   HDL 37 (L) 10/22/2017   LDLCALC 75 10/22/2017   TRIG 145  10/22/2017   CHOLHDL 3.8 10/22/2017   Lab Results  Component Value Date   CREATININE 1.46 (H) 05/24/2018   BUN 16 05/24/2018   NA 142 05/24/2018   K 5.1 05/24/2018   CL 103 05/24/2018   CO2 24 05/24/2018    ASSESSMENT / PLAN: Problem List Items Addressed This Visit    CAD, multiple vessel -  status post CABG: LIMA-LAD, SVG-RPDA; PCI native RCA for 100% SVG-RCA - Primary (Chronic)    Multivessel disease now status post CABG as well as PCI to the RCA that was recanalized.  Vein graft to the RCA is now occluded with the RCA is stented. Plan to follow-up Myoview next fall.  Continue Plavix without aspirin. Continue statin plus fenofibrate --> consider increasing atorvastatin to 40 mg every day On stable low-dose beta-blocker.  No longer on ACE inhibitor because of low blood pressures.      Relevant Orders   EKG 12-Lead (Completed)   Chronic respiratory failure with hypoxia (HCC) - SaO2 drops to 87% with ambulation (Chronic)    Probably almost end-stage lung disease.  Very limited as far as any activity because of dyspnea.  Does not like wearing oxygen, but therefore is not able to really do much of any walking.  I try to get him to get up walk around, but he seems to be pretty much excepting his inability to do things.  Is frustrated, but knows that he cannot do anything about it.      Essential hypertension (Chronic)    Well-controlled on beta-blocker.  Discontinued ARB because of hypotension and orthostasis.      Hyperlipidemia with target LDL less than 70 (Chronic)    Close to goal in March 2019.  Reassess her PCP.  If LDL still not at goal, would simply increase atorvastatin to 40 mg daily. Triglycerides are pretty well controlled with fenofibrate.      PAD (peripheral artery disease) - Right Common Iliac Occlusion, Left Common Iliac 80+% occlusion (Chronic)    Known occluded right iliac with patent left iliac stent.  ABIs relatively normal.  Obviously getting enough  collateral flow to the right leg via lumbar-internal iliac collateralization. Continue to follow Dopplers. On adequate cardiac medications.      Relevant Orders   EKG 12-Lead (Completed)   Presence of drug coated stent in right coronary artery (Chronic)    Remains on Plavix without aspirin.  Using Plavix for both CAD and PAD. Okay to hold for procedures.        Current medicines are reviewed at length with the patient today.  (+/- concerns) none The following changes have been made:  None  Patient Instructions  Medication Instructions:  NOT NEEDED If you need a refill on your cardiac medications before your next appointment, please call your pharmacy.   Lab work: NOT NEEDED If you have labs (blood work) drawn today and your tests are completely normal, you will receive your results only by: Marland Kitchen MyChart Message (if you have MyChart) OR . A paper copy in the mail If you have any lab test that is abnormal or we need to change your treatment, we will call you to review the results.  Testing/Procedures: NOT NEEDED  Follow-Up: At [facility], you and your health needs are our priority.  As part of our continuing mission to provide you with exceptional heart care, we have created designated Provider Care Teams.  These Care Teams include your primary Cardiologist (physician) and Advanced Practice Providers (APPs -  Physician Assistants and Nurse Practitioners) who all work together to provide you with the care you need, when you need it. You will need a follow up appointment in 12 months.  Please call our office 2 months in advance to schedule this appointment.  You may see Glenetta Hew, MD or one of the following Advanced Practice Providers on your designated Care Team:  Rhonda Barrett, PA-C . Jory Sims, DNP, ANP  Any Other Special Instructions Will Be Listed Below (If Applica   Has lower extremity arterial Dopplers ordered annually.  Studies Ordered:   Orders Placed  This Encounter  Procedures  . EKG 12-Lead      Glenetta Hew, M.D., M.S. Interventional Cardiologist   Pager # [phone] Phone # [phone] [address] [place], [place] [address]   Thank you for choosing Heartcare at [facility]!!

## 2018-06-24 ENCOUNTER — Encounter: Payer: Self-pay | Admitting: Cardiology

## 2018-06-24 NOTE — Assessment & Plan Note (Signed)
Remains on Plavix without aspirin.  Using Plavix for both CAD and PAD. Okay to hold for procedures.

## 2018-06-24 NOTE — Assessment & Plan Note (Signed)
Multivessel disease now status post CABG as well as PCI to the RCA that was recanalized.  Vein graft to the RCA is now occluded with the RCA is stented. Plan to follow-up Myoview next fall.  Continue Plavix without aspirin. Continue statin plus fenofibrate --> consider increasing atorvastatin to 40 mg every day On stable low-dose beta-blocker.  No longer on ACE inhibitor because of low blood pressures.

## 2018-06-24 NOTE — Assessment & Plan Note (Signed)
Known occluded right iliac with patent left iliac stent.  ABIs relatively normal.  Obviously getting enough collateral flow to the right leg via lumbar-internal iliac collateralization. Continue to follow Dopplers. On adequate cardiac medications.

## 2018-06-24 NOTE — Assessment & Plan Note (Signed)
Close to goal in March 2019.  Reassess her PCP.  If LDL still not at goal, would simply increase atorvastatin to 40 mg daily. Triglycerides are pretty well controlled with fenofibrate.

## 2018-06-24 NOTE — Assessment & Plan Note (Signed)
Probably almost end-stage lung disease.  Very limited as far as any activity because of dyspnea.  Does not like wearing oxygen, but therefore is not able to really do much of any walking.  I try to get him to get up walk around, but he seems to be pretty much excepting his inability to do things.  Is frustrated, but knows that he cannot do anything about it.

## 2018-06-24 NOTE — Assessment & Plan Note (Signed)
Well-controlled on beta-blocker.  Discontinued ARB because of hypotension and orthostasis.

## 2018-06-28 NOTE — Telephone Encounter (Signed)
Patient received results at office appointment 06/22/18.

## 2018-07-11 ENCOUNTER — Other Ambulatory Visit: Payer: Medicare HMO

## 2018-07-11 DIAGNOSIS — E039 Hypothyroidism, unspecified: Secondary | ICD-10-CM

## 2018-07-12 LAB — TSH: TSH: 2.47 u[IU]/mL (ref 0.450–4.500)

## 2018-07-12 LAB — T4, FREE: Free T4: 1.47 ng/dL (ref 0.82–1.77)

## 2018-07-14 ENCOUNTER — Other Ambulatory Visit: Payer: Self-pay | Admitting: Cardiology

## 2018-08-30 ENCOUNTER — Other Ambulatory Visit: Payer: Self-pay | Admitting: Cardiology

## 2018-08-31 NOTE — Telephone Encounter (Signed)
Rx(s) sent to pharmacy electronically.  

## 2018-09-14 ENCOUNTER — Telehealth: Payer: Self-pay | Admitting: Cardiology

## 2018-09-14 NOTE — Telephone Encounter (Signed)
New message      Pt c/o medication issue:  1. Name of Medication: atorvastatin (LIPITOR) 20 MG tablet  2. How are you currently taking this medication (dosage and times per day)? 2 pills every other day and 1 every other   3. Are you having a reaction (difficulty breathing--STAT)? no  4. What is your medication issue? Pt stated that he cant get qty of 117 that he has been getting and just got an refill with only 30 day supply. Pt  got an letter from [facility]  [phone] and  fax [phone] that stated that he can only get 30 day supply from insurance and needs auth .

## 2018-09-14 NOTE — Telephone Encounter (Signed)
Returned call to patient he stated he received a letter from his insurance about Atorvastatin.He needs a prior authorization before insurance will pay.Advised I will send message to Dr.Harding's RN.

## 2018-09-28 ENCOUNTER — Other Ambulatory Visit: Payer: Self-pay | Admitting: Family Medicine

## 2018-09-28 DIAGNOSIS — E559 Vitamin D deficiency, unspecified: Secondary | ICD-10-CM

## 2018-10-10 ENCOUNTER — Other Ambulatory Visit: Payer: Self-pay | Admitting: Cardiology

## 2018-10-24 ENCOUNTER — Ambulatory Visit: Payer: Medicare HMO | Admitting: Family Medicine

## 2018-11-03 ENCOUNTER — Other Ambulatory Visit: Payer: Self-pay | Admitting: Family Medicine

## 2018-11-03 ENCOUNTER — Ambulatory Visit (INDEPENDENT_AMBULATORY_CARE_PROVIDER_SITE_OTHER): Payer: Medicare HMO | Admitting: Family Medicine

## 2018-11-03 ENCOUNTER — Other Ambulatory Visit: Payer: Self-pay

## 2018-11-03 DIAGNOSIS — I10 Essential (primary) hypertension: Secondary | ICD-10-CM | POA: Diagnosis not present

## 2018-11-03 DIAGNOSIS — E785 Hyperlipidemia, unspecified: Secondary | ICD-10-CM

## 2018-11-03 DIAGNOSIS — I25118 Atherosclerotic heart disease of native coronary artery with other forms of angina pectoris: Secondary | ICD-10-CM | POA: Diagnosis not present

## 2018-11-03 DIAGNOSIS — J439 Emphysema, unspecified: Secondary | ICD-10-CM

## 2018-11-04 LAB — CBC WITH DIFFERENTIAL/PLATELET
BASOS ABS: 0.1 10*3/uL (ref 0.0–0.2)
Basos: 1 %
EOS (ABSOLUTE): 0.1 10*3/uL (ref 0.0–0.4)
Eos: 2 %
Hematocrit: 45.5 % (ref 37.5–51.0)
Hemoglobin: 14.8 g/dL (ref 13.0–17.7)
Immature Grans (Abs): 0 10*3/uL (ref 0.0–0.1)
Immature Granulocytes: 0 %
Lymphocytes Absolute: 1.2 10*3/uL (ref 0.7–3.1)
Lymphs: 21 %
MCH: 31.5 pg (ref 26.6–33.0)
MCHC: 32.5 g/dL (ref 31.5–35.7)
MCV: 97 fL (ref 79–97)
MONOS ABS: 0.4 10*3/uL (ref 0.1–0.9)
Monocytes: 6 %
Neutrophils Absolute: 3.9 10*3/uL (ref 1.4–7.0)
Neutrophils: 70 %
Platelets: 304 10*3/uL (ref 150–450)
RBC: 4.7 x10E6/uL (ref 4.14–5.80)
RDW: 13 % (ref 11.6–15.4)
WBC: 5.6 10*3/uL (ref 3.4–10.8)

## 2018-11-04 LAB — COMPREHENSIVE METABOLIC PANEL
A/G RATIO: 1.4 (ref 1.2–2.2)
ALBUMIN: 4.4 g/dL (ref 3.7–4.7)
ALK PHOS: 67 IU/L (ref 39–117)
ALT: 23 IU/L (ref 0–44)
AST: 25 IU/L (ref 0–40)
BUN / CREAT RATIO: 14 (ref 10–24)
BUN: 21 mg/dL (ref 8–27)
Bilirubin Total: 0.5 mg/dL (ref 0.0–1.2)
CALCIUM: 9.7 mg/dL (ref 8.6–10.2)
CO2: 20 mmol/L (ref 20–29)
Chloride: 103 mmol/L (ref 96–106)
Creatinine, Ser: 1.46 mg/dL — ABNORMAL HIGH (ref 0.76–1.27)
GFR calc Af Amer: 54 mL/min/{1.73_m2} — ABNORMAL LOW (ref >59)
GFR, EST NON AFRICAN AMERICAN: 47 mL/min/{1.73_m2} — AB (ref >59)
GLOBULIN, TOTAL: 3.1 g/dL (ref 1.5–4.5)
Glucose: 93 mg/dL (ref 65–99)
POTASSIUM: 4.9 mmol/L (ref 3.5–5.2)
SODIUM: 140 mmol/L (ref 134–144)
Total Protein: 7.5 g/dL (ref 6.0–8.5)

## 2018-11-04 LAB — LIPID PANEL W/O CHOL/HDL RATIO
Cholesterol, Total: 131 mg/dL (ref 100–199)
HDL: 36 mg/dL — ABNORMAL LOW (ref >39)
LDL Calculated: 69 mg/dL (ref 0–99)
TRIGLYCERIDES: 130 mg/dL (ref 0–149)
VLDL Cholesterol Cal: 26 mg/dL (ref 5–40)

## 2018-11-04 LAB — HGB A1C W/O EAG: Hgb A1c MFr Bld: 5.7 % — ABNORMAL HIGH (ref 4.8–5.6)

## 2018-11-04 LAB — VITAMIN D 25 HYDROXY (VIT D DEFICIENCY, FRACTURES): Vit D, 25-Hydroxy: 42.9 ng/mL (ref 30.0–100.0)

## 2018-11-04 LAB — TSH: TSH: 3.72 u[IU]/mL (ref 0.450–4.500)

## 2018-11-04 LAB — T4, FREE: Free T4: 1.59 ng/dL (ref 0.82–1.77)

## 2018-11-16 NOTE — Progress Notes (Signed)
Please see scanned in office visit note for today's visit.  Computers were down and we were unable to chart. 

## 2018-11-21 ENCOUNTER — Other Ambulatory Visit: Payer: Self-pay | Admitting: Family Medicine

## 2018-11-21 DIAGNOSIS — E039 Hypothyroidism, unspecified: Secondary | ICD-10-CM

## 2018-11-26 IMAGING — CT CT CHEST LUNG CANCER SCREENING LOW DOSE W/O CM
1 of 4 series · 10 of 40 positions shown, 13 images · non-contrast
Comparison: None.

CLINICAL DATA: 71-year-old male with 100 pack-year history of
smoking. Lung cancer screening.

EXAM:
CT CHEST WITHOUT CONTRAST LOW-DOSE FOR LUNG CANCER SCREENING
TECHNIQUE: Multidetector CT imaging of the chest was performed following the
standard protocol without IV contrast.

[ct lung segmentation data · axial · 0.77mm/px · z∈[-316,-316]mm · 10 of 278 frames shown]
[frame 1/278  mediastinal]
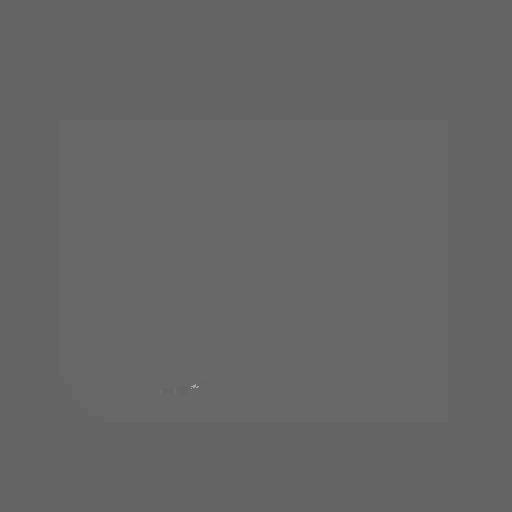
[frame 1/278  lung]
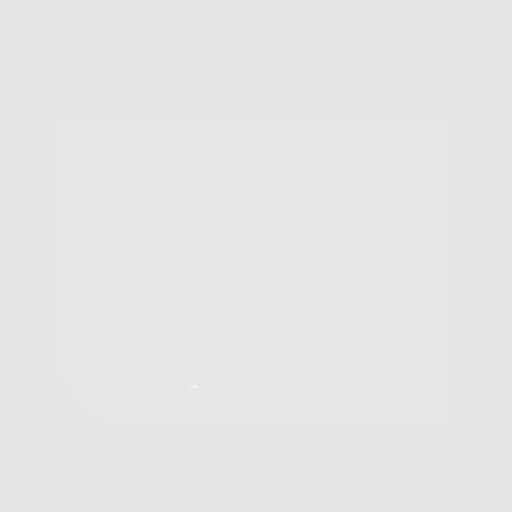
[frame 31/278  lung]
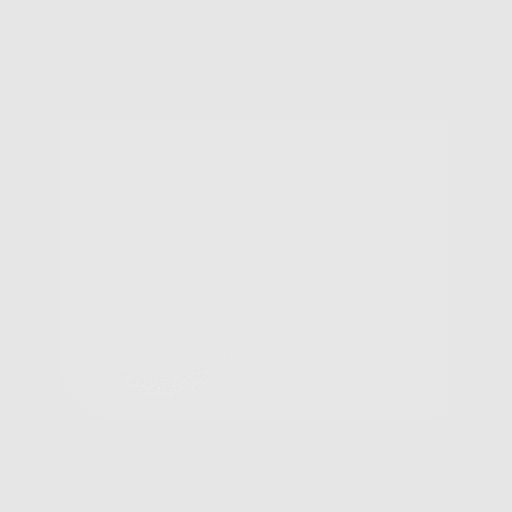
[frame 62/278  lung]
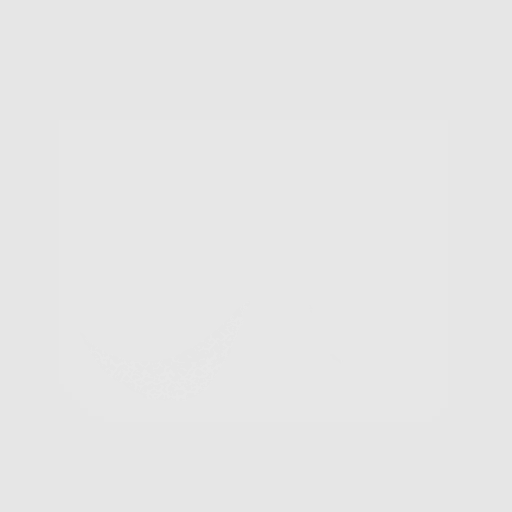
[frame 93/278  lung]
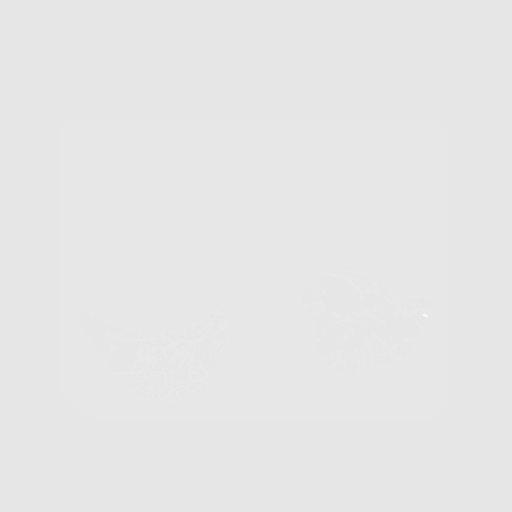
[frame 124/278  mediastinal]
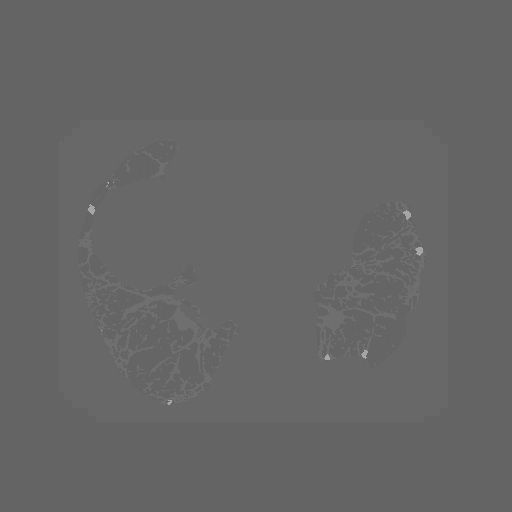
[frame 124/278  lung]
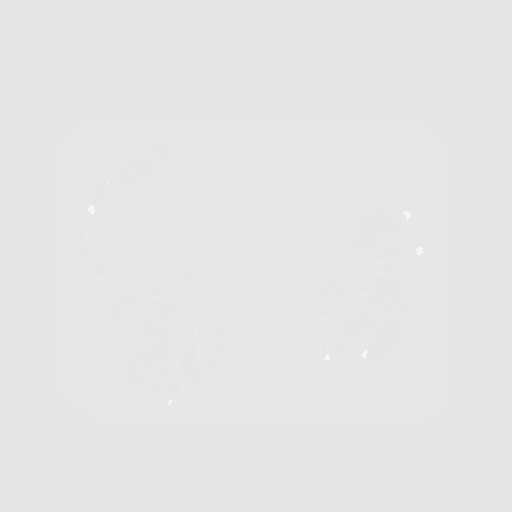
[frame 154/278  lung]
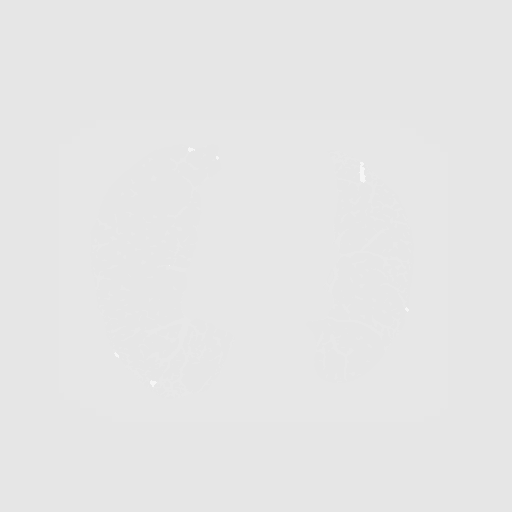
[frame 185/278  lung]
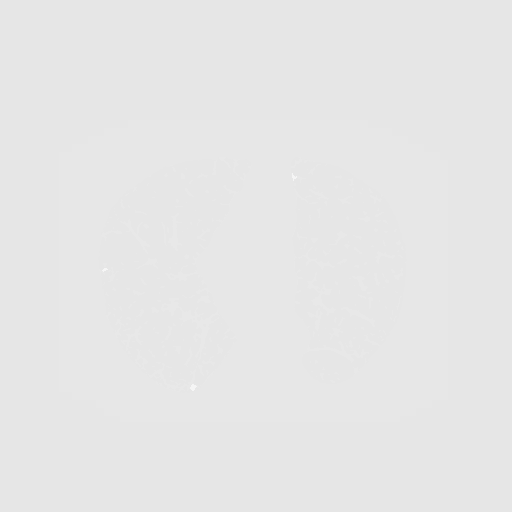
[frame 216/278  lung]
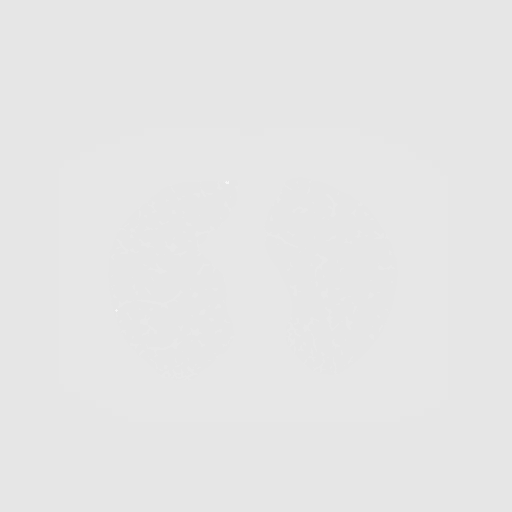
[frame 247/278  mediastinal]
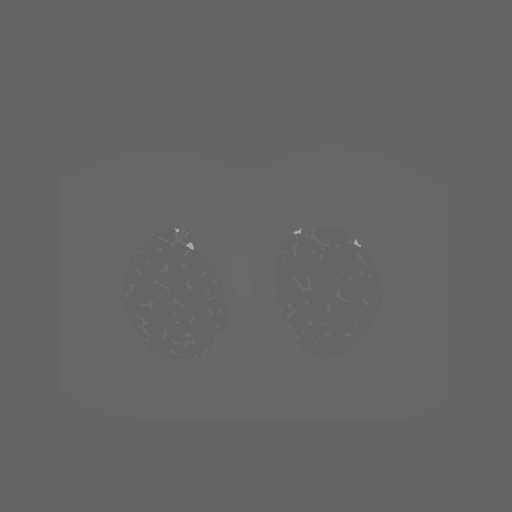
[frame 247/278  lung]
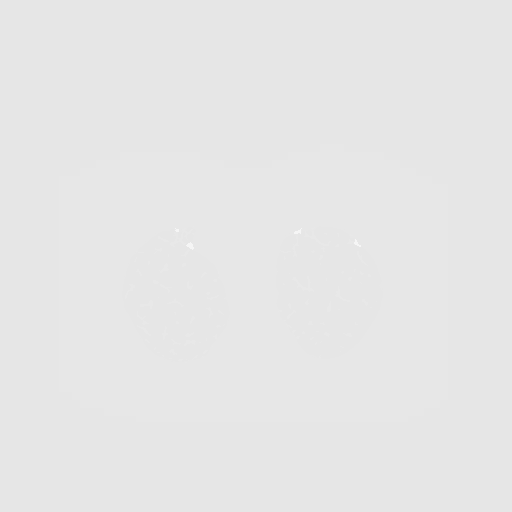
[frame 278/278  lung]
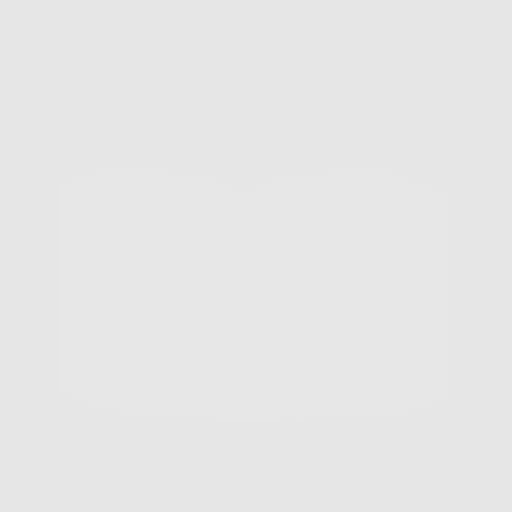

[10 of 40 positions shown; findings below may reference images not displayed]

FINDINGS: Cardiovascular: The heart size is normal. No pericardial effusion.
Coronary artery calcification is evident. Atherosclerotic
calcification is noted in the wall of the thoracic aorta.

Mediastinum/Nodes: No mediastinal lymphadenopathy. No evidence for
gross hilar lymphadenopathy although assessment is limited by the
lack of intravenous contrast on today's study. The esophagus has
normal imaging features.

Lungs/Pleura: Centrilobular and paraseptal emphysema is noted
bilaterally. This is associated with diffuse subpleural reticulation
with a lower lobe predominance. Posterior right lower lobe 4.7 cm
masslike area of focal airspace consolidation is measured on
standard CT images in long axis and demonstrates bronchovascular
tethering with adjacent pleural thickening, features characteristic
for rounded atelectasis.

Upper Abdomen: Unremarkable.

Musculoskeletal: Bone windows reveal no worrisome lytic or sclerotic
osseous lesions.
IMPRESSION: 1. Lung-RADS 3, probably benign findings. Short-term follow-up in 6
months is recommended with repeat low-dose chest CT without contrast
(please use the following order, "CT CHEST LCS NODULE FOLLOW-UP W/O
CM"). 4.7 cm rounded masslike opacity in the posterior left lower
lobe has imaging features very characteristic for rounded
atelectasis. Given no previous imaging studies with which to ensure
chronicity, repeat screening CT recommended to ensure stability.
2. Subpleural reticulation suggesting component of underlying
fibrotic lung disease.
3.  Aortic Atherosclerois ([id number]-170.0)

## 2018-12-13 ENCOUNTER — Telehealth: Payer: Self-pay | Admitting: Family Medicine

## 2018-12-13 NOTE — Telephone Encounter (Signed)
Advised patient to get daughter to bring me the letter from insurance so I can see what all they need. MPulliam, CMA/RT(R)

## 2018-12-13 NOTE — Telephone Encounter (Signed)
Patient is having issues with getting his oxygen from [facility] and states that he is being told he needs approval or letter of necessity for this oxygen therapy to continue. He would like to discuss this further with the nurse when she is available.

## 2018-12-14 ENCOUNTER — Telehealth: Payer: Self-pay

## 2018-12-15 NOTE — Telephone Encounter (Signed)
error 

## 2018-12-29 DIAGNOSIS — J449 Chronic obstructive pulmonary disease, unspecified: Secondary | ICD-10-CM | POA: Diagnosis not present

## 2019-01-11 ENCOUNTER — Other Ambulatory Visit: Payer: Self-pay

## 2019-01-11 ENCOUNTER — Ambulatory Visit (INDEPENDENT_AMBULATORY_CARE_PROVIDER_SITE_OTHER): Payer: Medicare HMO | Admitting: Family Medicine

## 2019-01-11 ENCOUNTER — Encounter: Payer: Self-pay | Admitting: Family Medicine

## 2019-01-11 VITALS — Ht 67.0 in | Wt 210.0 lb

## 2019-01-11 DIAGNOSIS — I70213 Atherosclerosis of native arteries of extremities with intermittent claudication, bilateral legs: Secondary | ICD-10-CM | POA: Diagnosis not present

## 2019-01-11 DIAGNOSIS — Z136 Encounter for screening for cardiovascular disorders: Secondary | ICD-10-CM | POA: Diagnosis not present

## 2019-01-11 DIAGNOSIS — Z87891 Personal history of nicotine dependence: Secondary | ICD-10-CM

## 2019-01-11 DIAGNOSIS — Z0000 Encounter for general adult medical examination without abnormal findings: Secondary | ICD-10-CM

## 2019-01-11 DIAGNOSIS — J439 Emphysema, unspecified: Secondary | ICD-10-CM | POA: Diagnosis not present

## 2019-01-11 DIAGNOSIS — IMO0001 Reserved for inherently not codable concepts without codable children: Secondary | ICD-10-CM

## 2019-01-11 DIAGNOSIS — I739 Peripheral vascular disease, unspecified: Secondary | ICD-10-CM

## 2019-01-11 DIAGNOSIS — J9611 Chronic respiratory failure with hypoxia: Secondary | ICD-10-CM

## 2019-01-11 DIAGNOSIS — I25118 Atherosclerotic heart disease of native coronary artery with other forms of angina pectoris: Secondary | ICD-10-CM

## 2019-01-11 DIAGNOSIS — Z9981 Dependence on supplemental oxygen: Secondary | ICD-10-CM

## 2019-01-11 DIAGNOSIS — R911 Solitary pulmonary nodule: Secondary | ICD-10-CM | POA: Diagnosis not present

## 2019-01-11 NOTE — Progress Notes (Signed)
Telehealth office visit note for Michael [name], D.O- at Primary Care at [facility]   I connected with current patient today and verified that I am speaking with the correct person using two identifiers.   . Location of the patient: Home . Location of the provider: Office Only the patient (+/- their family members at pt's discretion) and myself were participating in the encounter    - This visit type was conducted due to national recommendations for restrictions regarding the COVID-19 Pandemic (e.g. social distancing) in an effort to limit this patient's exposure and mitigate transmission in our community.  This format is felt to be most appropriate for this patient at this time.   - The patient did not have access to video technology or had technical difficulties with video requiring transitioning to audio format only. - No physical exam could be performed with this format, beyond that communicated to [place] by the patient/ family members as noted.   - Additionally my office staff/ schedulers discussed with the patient that there may be a monetary charge related to this service, depending on their medical insurance.   The patient expressed understanding, and agreed to proceed.   Subjective:   Michael [name] is a 73 y.o. male who presents for Medicare Annual/Subsequent preventive examination.  Patient last Cologuard was done in 3 of 2019.  It was negative.  -Low-dose CT scan was also done 04/2018.  Recommendation is to continue annual screening q. 12 months.  -Patient declines all vaccines and understands risks and benefits of this.  -I do not see where patient has ever had a AAA screen  Several yrs ago - pt was discharged from hospital after having two stents put in his heart, with home O2.    He has concerns about his insurance not wanting to pay for it today.   Total bill for his home O2 is 320/mo.     Health Maintenance  Topic Date Due  . Tetanus Vaccine  01/11/2020*  . Pneumonia vaccines  (1 of 2 - PCV13) 01/11/2020*  . Flu Shot  03/25/2019  . Cologuard (Stool DNA test)  10/31/2020  .  Hepatitis C: One time screening is recommended by Center for Disease Control  (CDC) for  adults born from [birth year] through 1965.   Completed  *Topic was postponed. The date shown is not the original due date.     There is no immunization history on file for this patient.- he has refused them all in the past.  Review of Systems:  Globally negative. No acute complaints or concerns     Objective:    Vitals: Ht _0  (1.702 m)   Wt 210 lb (95.3 kg)   BMI 32.89 kg/m   Body mass index is 32.89 kg/m.  Advanced Directives 05/24/2017 11/24/2016 10/27/2011 10/27/2011  Does Patient Have a Medical Advance Directive? Yes No Patient does not have advance directive;Patient would not like information Patient does not have advance directive  Type of Advance Directive Living will;Healthcare Power of Attorney - - -  Would patient like information on creating a medical advance directive? - No - Patient declined - -    Tobacco Social History   Tobacco Use  Smoking Status Former Smoker  . Packs/day: 3.00  . Years: 50.00  . Pack years: 150.00  . Types: Cigarettes  . Last attempt to quit: 06/08/2011  . Years since quitting: 7.6  Smokeless Tobacco Former Systems developer  . Types: Chew  . Quit date: 07/13/2003  Counseling given: Not Answered    Past Medical History:  Diagnosis Date  . Arthritis    "right hip; right wrist" (11/24/2016)  . CAD, multiple vessel 05/2011   90% mid LAD with diffuse proximal stenosis, 100% proximal RCA --> s/p CABG x 2 (LIMA-LAD, SVG-dRCA); b) Lexiscan Myoview 09/2016 w/ Inferior-Inferolateral defect -> Cath:  11/24/16: 100% CTO of SVG-RCA (but nagtive /RCA only subtotal occlusion) --> PCI with DES x2 (resolute onyx DES 2.25 mm x 30 mm, 2.5 mm x 20 mm) RCA  . Carotid artery occlusion   . COPD (chronic obstructive pulmonary disease) with emphysema ([place])   . Essential hypertension   .  History of gout   . History of tobacco abuse    quit 05/2011  . Hyperlipidemia with target LDL less than 70   . Hypothyroidism   . MI (myocardial infarction) ([place]) 05/2011   "I had 3 in 2 days"  . NSTEMI (non-ST elevated myocardial infarction) ([place]) 06/05/2011   With CHF - probably late presentation for missed STEMI --> CATH- MV CAD;; Echo 07/2001: EF > 55%, mild MAC, mild TR, no WMA  with Post-op Setpal dyskinesis, [place] not seen)  . PAD (peripheral artery disease) ([place]) 06/08/2011   a) R Common Iliac 100%, L Common Iliac 80+% occlusion; b) L CIA - Aterectomy- 10 mm x 40 mm self-extending Stent (postdilated to 7 mm with 20-30% residual)--> Deferred R AoFem bypass;; c) LEA Dopplers 04/2012 & 04/2014 - RABI 0.8 & LABI 1.1 - STABLE, no change  . Pneumonia 1940s X 1  . Presence of drug coated stent in right coronary artery 11/24/2016   p-mRCA: Resolute Onyx DES 2.25 mm x 38 mm --> 2.5 mm x 18 mm (post-dilated from 2.7-2.8 mm)    Past Surgical History:  Procedure Laterality Date  . CARDIAC CATHETERIZATION  06/09/2011  . CORONARY ANGIOPLASTY WITH STENT PLACEMENT  11/24/2016   with 100% SVG-RCA & flow in native RCA now present --> PCI-p-m RCA 2 Overlapping Resolute DES (2.25 x 38, 1.5 x 18)  . CORONARY ARTERY BYPASS GRAFT  06/10/2011   oLIMA to LAD, SVG to PDA (Dr. Servando Snare)  . CORONARY STENT INTERVENTION N/A 11/24/2016   Procedure: Coronary Stent Intervention;  Surgeon: Leonie Man, MD;  Location: [facility] INVASIVE CV LAB: [place] 2.25X38 & overlapping DES RESOLUTE ONYX 2.5X18   . ILIAC ARTERY STENT  10/27/2011   left CIA - 66mx40mm self-expanding stent, decreasing downt to 20-30% lesion, 100% occluded R CIA with extensive lumbar collateralization to R internal iliac;  05/2016: Ao-Ileac & LEA Dopplers: RABI 0.8, LABI 1.2. B TBI 0.77 -- he has known B disease s/p L CIA stent. RLABI decreased and LABI is normal; aortoiliac atherosclerosis w/ CTO of R CIA but normal REIA.  .Marland KitchenLEFT HEART CATH AND  CORS/GRAFTS ANGIOGRAPHY N/A 11/24/2016   Procedure: LEFT HEART CATH AND CORS/GRAFTS ANGIOGRAPHY;  Surgeon: DLeonie Man MD;  Location: [facility] INVASIVE CV LAB: 100% SVG-RCA, but now native RCA only sub-CTO with antegrade flow.  85% mLAD after D1. 40% oOM1 --> PCI of Native RCA  . NM MYOVIEW LTD  09/2016    EF 62%. Medium sized, moderate severity defect in the basal inferior, mid inferoseptal and mid inferior walls is partially reversible. - INTERMEDIATE RISK -- CATH  . PERCUTANEOUS STENT INTERVENTION Left 10/27/2011   Procedure: PERCUTANEOUS STENT INTERVENTION;  Surgeon: JLorretta Harp MD;  [facility];  Service: Cardiovascular;  Laterality: Left;  . TONSILLECTOMY  ~ 1963  .  TRANSTHORACIC ECHOCARDIOGRAM  08/19/2011   EF=>55%; mild mitral annular calcif; mild TR    Family History  Problem Relation Age of Onset  . Heart disease Mother        valve replacement  . Diabetes Mother   . Heart failure Mother   . CAD Sister        cardiac stent  . Stroke Sister        stents in neck  . Pneumonia Father     Social History   Socioeconomic History  . Marital status: Divorced    Spouse name: Not on file  . Number of children: 3  . Years of education: Not on file  . Highest education level: Not on file  Occupational History  . Not on file  Social Needs  . Financial resource strain: Not on file  . Food insecurity:    Worry: Not on file    Inability: Not on file  . Transportation needs:    Medical: Not on file    Non-medical: Not on file  Tobacco Use  . Smoking status: Former Smoker    Packs/day: 3.00    Years: 50.00    Pack years: 150.00    Types: Cigarettes    Last attempt to quit: 06/08/2011    Years since quitting: 7.6  . Smokeless tobacco: Former Systems developer    Types: [place] date: 07/13/2003  Substance and Sexual Activity  . Alcohol use: No    Comment: 11/24/2016 "nothing in 2 1/2 years; never an alcoholic"  . Drug use: No  . Sexual activity: Never  Lifestyle  .  Physical activity:    Days per week: Not on file    Minutes per session: Not on file  . Stress: Not on file  Relationships  . Social connections:    Talks on phone: Not on file    Gets together: Not on file    Attends religious service: Not on file    Active member of club or organization: Not on file    Attends meetings of clubs or organizations: Not on file    Relationship status: Not on file  Other Topics Concern  . Not on file  Social History Narrative  . Not on file    Outpatient Encounter Medications as of 01/11/2019  Medication Sig  . atorvastatin (LIPITOR) 20 MG tablet TAKE 1 TABLET BY MOUTH ALTERNATING WITH TWO TABLETS EVERY OTHER DAY AT BEDTIME  . clopidogrel (PLAVIX) 75 MG tablet TAKE 1 TABLET BY MOUTH DAILY  . fenofibrate (TRICOR) 48 MG tablet Take 1 tablet (48 mg total) by mouth daily.  Marland Kitchen levothyroxine (SYNTHROID, LEVOTHROID) 75 MCG tablet TAKE 1 TABLET BY MOUTH DAILY  . metoprolol tartrate (LOPRESSOR) 25 MG tablet TAKE 1 TABLET BY MOUTH TWICE DAILY  . nitroGLYCERIN (NITROSTAT) 0.4 MG SL tablet Place 1 tablet (0.4 mg total) under the tongue every 5 (five) minutes as needed.  . Vitamin D, Ergocalciferol, (DRISDOL) 1.25 MG (50000 UT) CAPS capsule TAKE 1 CAPSULE BY MOUTH EVERY 7 DAYS   No facility-administered encounter medications on file as of 01/11/2019.     Activities of Daily Living In your present state of health, do you have any difficulty performing the following activities: 01/11/2019  Hearing? Y  Vision? N  Difficulty concentrating or making decisions? Y  Walking or climbing stairs? Y  Dressing or bathing? Y  Doing errands, shopping? N  Some recent data might be hidden    Activities of Daily Living  In your present state of health, do you have difficulty performing the following activities? 1- Driving - no 2- Managing money - no 3- Feeding yourself - no 4- Getting from the bed to the chair - no 5- Climbing a flight of stairs - yes 6- Preparing food and  eating - no 7- Bathing or showering - yes 8- Getting dressed - no 9- Getting to the toilet - no 10- Using the toilet - no 11- Moving around from place to place - yes   Patient feels safe at home.   Patient Care Team: Michael Dance, DO as PCP - General (Family Medicine) Leonie Man, MD as PCP - Cardiology (Cardiology) Leonie Man, MD as Consulting Physician (Cardiology)    Assessment:   This is a routine wellness examination for [place].  Exercise Activities and Dietary recommendations Current Exercise Habits: The patient does not participate in regular exercise at present   - Pt not interested in health maintenance,    Goals   None     Fall Risk Fall Risk  01/11/2019 05/24/2018 10/22/2017 05/24/2017  Falls in the past year? 0 No No Yes  Number falls in past yr: - - - 1  Injury with Fall? - - - No  Risk for fall due to : Impaired balance/gait;Impaired mobility - - -  Follow up Falls evaluation completed - - -   Is the patient's home free of loose throw rugs in walkways, pet beds, electrical cords, etc?   yes      Grab bars in the bathroom? yes      Handrails on the stairs?   yes      Adequate lighting?   yes   Timed Get Up and Go Performed: unable to perform   Depression Screen PHQ 2/9 Scores 01/11/2019 05/24/2018 10/22/2017 10/22/2017  PHQ - 2 Score 0 0 0 0  PHQ- 9 Score 0 - 3 -    Depression screen [facility] 2/9 01/11/2019 05/24/2018 10/22/2017 10/22/2017 05/24/2017  Decreased Interest 0 0 0 0 3  Down, Depressed, Hopeless 0 0 0 0 0  PHQ - 2 Score 0 0 0 0 3  Altered sleeping 0 - 0 - 0  Tired, decreased energy 0 - 0 - 3  Change in appetite 0 - 0 - 0  Feeling bad or failure about yourself  0 - 0 - 0  Trouble concentrating 0 - 3 - 3  Moving slowly or fidgety/restless 0 - 0 - 0  Suicidal thoughts 0 - 0 - 0  PHQ-9 Score 0 - 3 - 9  Difficult doing work/chores Not difficult at all - Not difficult at all - Not difficult at all     Cognitive Function   6CIT Screen  01/11/2019 10/22/2017  What Year? 0 points 0 points  What month? 0 points 0 points  What time? 0 points 0 points  Count back from 20 0 points 0 points  Months in reverse 0 points 0 points  Repeat phrase 10 points 10 points  Total Score 10 10    There is no immunization history on file for this patient.   Qualifies for Shingles Vaccine? Patient declines   Screening Tests Health Maintenance  Topic Date Due  . TETANUS/TDAP  01/11/2020 (Originally 04/25/1965)  . PNA vac Low Risk Adult (1 of 2 - PCV13) 01/11/2020 (Originally 04/24/2009)  . INFLUENZA VACCINE  03/25/2019  . Fecal DNA (Cologuard)  10/31/2020  . Hepatitis C Screening  Completed   Cancer Screenings:  Lung: Low Dose CT Chest recommended if Age 75-80 years, 30 pack-year currently smoking OR have quit w/in 15years. Patient does qualify. Last scan was 04/29/2018 Colorectal: Cologuard 10/31/2017  Additional Screenings: Declined by pt     Plan:   - ophthalmology - has one in [place]  I have personally reviewed and noted the following in the patient's chart:   . Medical and social history . Use of alcohol, tobacco or illicit drugs  . Current medications and supplements . Functional ability and status . Nutritional status . Physical activity . Advanced directives . List of other physicians . Hospitalizations, surgeries, and ER visits in previous 12 months . Vitals . Screenings to include cognitive, depression, and falls . Referrals and appointments  In addition, I have reviewed and discussed with patient certain preventive protocols, quality metrics, and best practice recommendations. A written personalized care plan for preventive services as well as general preventive health recommendations were provided to patient.    Michael Dance, DO  01/11/2019

## 2019-01-23 ENCOUNTER — Ambulatory Visit: Payer: Medicare HMO | Admitting: Family Medicine

## 2019-01-23 ENCOUNTER — Other Ambulatory Visit: Payer: Self-pay

## 2019-01-23 DIAGNOSIS — J439 Emphysema, unspecified: Secondary | ICD-10-CM

## 2019-01-29 DIAGNOSIS — J449 Chronic obstructive pulmonary disease, unspecified: Secondary | ICD-10-CM | POA: Diagnosis not present

## 2019-02-21 ENCOUNTER — Institutional Professional Consult (permissible substitution): Payer: Medicare HMO | Admitting: Internal Medicine

## 2019-02-28 DIAGNOSIS — J449 Chronic obstructive pulmonary disease, unspecified: Secondary | ICD-10-CM | POA: Diagnosis not present

## 2019-03-09 ENCOUNTER — Other Ambulatory Visit: Payer: Self-pay

## 2019-03-09 ENCOUNTER — Ambulatory Visit (INDEPENDENT_AMBULATORY_CARE_PROVIDER_SITE_OTHER): Payer: Medicare HMO | Admitting: Family Medicine

## 2019-03-09 ENCOUNTER — Encounter: Payer: Self-pay | Admitting: Family Medicine

## 2019-03-09 VITALS — BP 150/75 | HR 63 | Temp 98.1°F | Ht 67.0 in | Wt 223.0 lb

## 2019-03-09 DIAGNOSIS — R911 Solitary pulmonary nodule: Secondary | ICD-10-CM | POA: Diagnosis not present

## 2019-03-09 DIAGNOSIS — I25118 Atherosclerotic heart disease of native coronary artery with other forms of angina pectoris: Secondary | ICD-10-CM

## 2019-03-09 DIAGNOSIS — R7303 Prediabetes: Secondary | ICD-10-CM | POA: Insufficient documentation

## 2019-03-09 DIAGNOSIS — R918 Other nonspecific abnormal finding of lung field: Secondary | ICD-10-CM

## 2019-03-09 DIAGNOSIS — Z122 Encounter for screening for malignant neoplasm of respiratory organs: Secondary | ICD-10-CM

## 2019-03-09 DIAGNOSIS — IMO0001 Reserved for inherently not codable concepts without codable children: Secondary | ICD-10-CM

## 2019-03-09 DIAGNOSIS — I70213 Atherosclerosis of native arteries of extremities with intermittent claudication, bilateral legs: Secondary | ICD-10-CM | POA: Diagnosis not present

## 2019-03-09 DIAGNOSIS — J439 Emphysema, unspecified: Secondary | ICD-10-CM

## 2019-03-09 DIAGNOSIS — Z87891 Personal history of nicotine dependence: Secondary | ICD-10-CM

## 2019-03-09 DIAGNOSIS — E039 Hypothyroidism, unspecified: Secondary | ICD-10-CM

## 2019-03-09 DIAGNOSIS — I10 Essential (primary) hypertension: Secondary | ICD-10-CM

## 2019-03-09 MED ORDER — LEVOTHYROXINE SODIUM 75 MCG PO TABS: 75.0000 ug | ORAL_TABLET | Freq: Every day | ORAL | 1 refills | Status: DC

## 2019-03-09 MED ORDER — NITROGLYCERIN 0.4 MG SL SUBL: 0.4000 mg | SUBLINGUAL_TABLET | SUBLINGUAL | 0 refills | Status: AC | PRN

## 2019-03-09 NOTE — Progress Notes (Signed)
Impression and Recommendations:    1. Pre-diabetes   2. Acquired hypothyroidism   3. History of tobacco abuse- 2+ppd for 50+ yrs- quit in Jun 08 2011   4. Pulmonary emphysema, unspecified emphysema type (HCC)   5. Atherosclerosis of native artery of both lower extremities with intermittent claudication (HCC)   6. CAD, multiple vessel - status post CABG: LIMA-LAD, SVG-RPDA; PCI native RCA for 100% SVG-RCA   7. Encounter for screening for malignant neoplasm of respiratory organs   8. Abnormal findings on diagnostic imaging of lung   9. Lung nodule < 6cm on CT 10/2017-   Post L Lower Lobe      Pre-diabetes  Acquired hypothyroidism - Plan: levothyroxine (SYNTHROID) 75 MCG tablet  History of tobacco abuse- 2+ppd for 50+ yrs- quit in Jun 08 2011 - Plan: CT CHEST LUNG CANCER SCREENING LOW DOSE WO CONTRAST  Pulmonary emphysema, unspecified emphysema type (HCC)  Atherosclerosis of native artery of both lower extremities with intermittent claudication (HCC)  CAD, multiple vessel - status post CABG: LIMA-LAD, SVG-RPDA; PCI native RCA for 100% SVG-RCA - Plan: nitroGLYCERIN (NITROSTAT) 0.4 MG SL tablet  Encounter for screening for malignant neoplasm of respiratory organs - Plan: CT CHEST LUNG CANCER SCREENING LOW DOSE WO CONTRAST  Abnormal findings on diagnostic imaging of lung - Plan: CT CHEST LUNG CANCER SCREENING LOW DOSE WO CONTRAST  Lung nodule < 6cm on CT 10/2017-   Post L Lower Lobe - Plan: CT CHEST LUNG CANCER SCREENING LOW DOSE WO CONTRAST    Orders Placed This Encounter  Procedures  . CT CHEST LUNG CANCER SCREENING LOW DOSE WO CONTRAST    Meds ordered this encounter  Medications  . nitroGLYCERIN (NITROSTAT) 0.4 MG SL tablet    Sig: Place 1 tablet (0.4 mg total) under the tongue every 5 (five) minutes as needed. After 3 doses, if no relief dial 911    Dispense:  30 tablet    Refill:  0  . levothyroxine (SYNTHROID) 75 MCG tablet    Sig: Take 1 tablet (75 mcg total) by  mouth daily.    Dispense:  90 tablet    Refill:  1    Medications Discontinued During This Encounter  Medication Reason  . nitroGLYCERIN (NITROSTAT) 0.4 MG SL tablet Reorder  . levothyroxine (SYNTHROID, LEVOTHROID) 75 MCG tablet Reorder     Gross side effects, risk and benefits, and alternatives of medications and treatment plan in general discussed with patient.  Patient is aware that all medications have potential side effects and we are unable to predict every side effect or drug-drug interaction that may occur.   Patient will call with any questions prior to using medication if they have concerns.    Expresses verbal understanding and consents to current therapy and treatment regimen.  No barriers to understanding were identified.  Red flag symptoms and signs discussed in detail.  Patient expressed understanding regarding what to do in case of emergency\urgent symptoms  Please see AVS handed out to patient at the end of our visit for further patient instructions/ counseling done pertaining to today's office visit.   Return for Patient will follow-up in 4- 6 months for chronic care.     Note:  This note was prepared with assistance of Dragon voice recognition software. Occasional wrong-word or sound-a-like substitutions may have occurred due to the inherent limitations of voice recognition software.  Thomasene Loteborah Zariyah Stephens, DO  03/09/2019 5:03 PM     --------------------------------------------------------------------------------------------------------------------------------------------------------------------------------------------------------------    Subjective:  HPI: Michael [name] is a 73 y.o. male who presents to [place] at [facility] today for issues as discussed below.   COPD:  o2 machine at home- 4 L everyday per his old Pulm doc in [place] whom patient has not seen for a couple of years.  Has appointment with pulmonology Dr. Melvyn Novas on 03/16/2019 for the  first time.  Patient understands that he will need to have pulmonary function tests and I encouraged him to be open minded about going on medicines to help improve his breathing.  Breathing worse in cold- and cna't go out in humidity. Otherwise does all ADL's    - Chronic kidney disease:   patient saw renal doc in the past- last time he went was 3-38yrs ago.    -11 of 2015 patient serum creatinine was 1.4 with a GFR of 50.  Most recently couple months ago his serum creatinine is 1.46 with a GFR 47.    This is remained quite stable.  He was told the past this was because of atherosclerotic renal disease.   -Patient complains of being more fatigued than usual especially when he awakens.  Patient states he "has himself" on 4 L of oxygen during the day and he does not take any oxygen at night.  He has refused sleep studies that I have offered to send him to.  Denies any cardiac symptoms.  Denies any chest pain, heart palpitations, etc.   Last labs performed on 11/03/2018:   -These were reviewed again with patient and all chronic conditions were evaluated  vitamin D 42.9  TSH 3.7 Free T4 1.59  LDL: 69 HDL; 36 TG- 130  Serum creatinine 1.46 GFR 47  CBC-stable and within normal limits  A1c 5.7   Wt Readings from Last 3 Encounters:  03/16/19 220 lb (99.8 kg)  03/09/19 223 lb (101.2 kg)  01/11/19 210 lb (95.3 kg)   BP Readings from Last 3 Encounters:  03/16/19 132/80  03/09/19 (!) 150/75  06/22/18 126/60   Pulse Readings from Last 3 Encounters:  03/16/19 68  03/09/19 63  06/22/18 77   BMI Readings from Last 3 Encounters:  03/16/19 34.46 kg/m  03/09/19 34.93 kg/m  01/11/19 32.89 kg/m     Patient Care Team    Relationship Specialty Notifications Start End  Mellody Dance, DO PCP - General Family Medicine  05/24/17   Leonie Man, MD PCP - Cardiology Cardiology Admissions 06/16/18   Leonie Man, MD Consulting Physician Cardiology  05/24/17      Patient  Active Problem List   Diagnosis Date Noted  . History of tobacco abuse- 2+ppd for 50+ yrs- quit in Jun 08 2011 05/24/2017    Priority: High  . Hyperlipidemia with target LDL less than 70 05/21/2013    Priority: High  . Essential hypertension     Priority: High    Class: Diagnosis of  . Hypothyroidism     Priority: High    Class: Diagnosis of  . COPD (chronic obstructive pulmonary disease) ([place]) 06/10/2011    Priority: High    Class: Diagnosis of  . Lung nodule < 6cm on CT 10/2017-   Post L Lower Lobe 11/05/2017    Priority: Medium  . Chronic respiratory failure with hypoxia (HCC) - SaO2 drops to 87% with ambulation 06/18/2015    Priority: Medium  . Atherosclerosis of native artery of extremity ([place]) 12/10/2011    Priority: Medium  . Peripheral arterial occlusive disease ([place]) 10/22/2011  Priority: Medium  . CAD, multiple vessel - status post CABG: LIMA-LAD, SVG-RPDA; PCI native RCA for 100% SVG-RCA 06/08/2011    Priority: Medium    Class: History of  . Aortic atherosclerosis (HCC)-found on CT scan done March 2019 11/05/2017    Priority: Low  . Colonoscopy refused 05/24/2017    Priority: Low  . PAD (peripheral artery disease) - Right Common Iliac Occlusion, Left Common Iliac 80+% occlusion 10/22/2011    Priority: Low    Class: Diagnosis of  . COPD  GOLD II with reversibility  03/16/2019  . Pre-diabetes 03/09/2019  . Prediabetes 05/24/2018  . Vitamin D deficiency 05/24/2018  . Chronic kidney disease, stage 3, mod decreased GFR (HCC) 05/24/2018  . Hyperkalemia 07/09/2017  . Noncompliance with immunization regimen 05/24/2017  . Personal history of noncompliance with medical treatment 05/24/2017  . Presence of drug coated stent in right coronary artery 11/24/2016  . HOH (hard of hearing) 11/25/2015  . Encounter for long-term (current) use of other medications 05/21/2013  . Atherosclerosis 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 12/10/2011  . S/P  CABG x 2 06/10/2011    Class: History of    Past Medical history, Surgical history, Family history, Social history, Allergies and Medications have been entered into the medical record, reviewed and changed as needed.    Current Meds  Medication Sig  . atorvastatin (LIPITOR) 20 MG tablet TAKE 1 TABLET BY MOUTH ALTERNATING WITH TWO TABLETS EVERY OTHER DAY AT BEDTIME  . fenofibrate (TRICOR) 48 MG tablet Take 1 tablet (48 mg total) by mouth daily.  Marland Kitchen. levothyroxine (SYNTHROID) 75 MCG tablet Take 1 tablet (75 mcg total) by mouth daily.  . metoprolol tartrate (LOPRESSOR) 25 MG tablet TAKE 1 TABLET BY MOUTH TWICE DAILY  . nitroGLYCERIN (NITROSTAT) 0.4 MG SL tablet Place 1 tablet (0.4 mg total) under the tongue every 5 (five) minutes as needed. After 3 doses, if no relief dial 911  . Vitamin D, Ergocalciferol, (DRISDOL) 1.25 MG (50000 UT) CAPS capsule TAKE 1 CAPSULE BY MOUTH EVERY 7 DAYS  . [DISCONTINUED] clopidogrel (PLAVIX) 75 MG tablet TAKE 1 TABLET BY MOUTH DAILY  . [DISCONTINUED] levothyroxine (SYNTHROID, LEVOTHROID) 75 MCG tablet TAKE 1 TABLET BY MOUTH DAILY  . [DISCONTINUED] nitroGLYCERIN (NITROSTAT) 0.4 MG SL tablet Place 1 tablet (0.4 mg total) under the tongue every 5 (five) minutes as needed.    Allergies:  Allergies  Allergen Reactions  . Ace Inhibitors Cough     Review of Systems:  A fourteen system review of systems was performed and found to be positive as per HPI.   Objective:   Blood pressure (!) 150/75, pulse 63, temperature 98.1 F (36.7 C), height 5\' 7"  (1.702 m), weight 223 lb (101.2 kg), SpO2 93 %. Body mass index is 34.93 kg/m. General:  Well Developed, well nourished, appropriate for stated age.  Neuro:  Alert and oriented,  extra-ocular muscles intact  HEENT:  Normocephalic, atraumatic, neck supple, no carotid bruits appreciated  Skin:  no gross rash, warm, pink. Cardiac:  RRR, S1 S2 Respiratory:  ECTA B/L and A/P, Not using accessory muscles, speaking in full  sentences- unlabored. Vascular:  Ext warm, no cyanosis apprec.; cap RF less 2 sec. Psych:  No HI/SI, judgement and insight good, Euthymic mood. Full Affect.

## 2019-03-09 NOTE — Patient Instructions (Addendum)
Mr. Michael [name] remember in September you will need another CT scan of your lungs.  The order is placed so they will be calling you to schedule this.  -Please make sure you go to Dr. Thurston HoleWert's appointment on [phone]-in the near future.    Chronic Obstructive Pulmonary Disease  Chronic obstructive pulmonary disease (COPD) is a long-term (chronic) condition that affects the lungs. COPD is a general term that can be used to describe many different lung problems that cause lung swelling (inflammation) and limit airflow, including chronic bronchitis and emphysema. If you have COPD, your lung function will probably never return to normal. In most cases, it gets worse over time. However, there are steps you can take to slow the progression of the disease and improve your quality of life. What are the causes? This condition may be caused by:  Smoking. This is the most common cause.  Certain genes passed down through families. What increases the risk? The following factors may make you more likely to develop this condition:  Secondhand smoke from cigarettes, pipes, or cigars.  Exposure to chemicals and other irritants such as fumes and dust in the work environment.  Chronic lung conditions or infections. What are the signs or symptoms? Symptoms of this condition include:  Shortness of breath, especially during physical activity.  Chronic cough with a large amount of thick mucus. Sometimes the cough may not have any mucus (dry cough).  Wheezing.  Rapid breaths.  Gray or bluish discoloration (cyanosis) of the skin, especially in your fingers, toes, or lips.  Feeling tired (fatigue).  Weight loss.  Chest tightness.  Frequent infections.  Episodes when breathing symptoms become much worse (exacerbations).  Swelling in the ankles, feet, or legs. This may occur in later stages of the disease. How is this diagnosed? This condition is diagnosed based on:  Your medical history.  A physical  exam. You may also have tests, including:  Lung (pulmonary) function tests. This may include a spirometry test, which measures your ability to exhale properly.  Chest X-ray.  CT scan.  Blood tests. How is this treated? This condition may be treated with:  Medicines. These may include inhaled rescue medicines to treat acute exacerbations as well as long-term, or maintenance, medicines to prevent flare-ups of COPD. ? Bronchodilators help treat COPD by dilating the airways to allow increased airflow and make your breathing more comfortable. ? Steroids can reduce airway inflammation and help prevent exacerbations.  Smoking cessation. If you smoke, your health care provider may ask you to quit, and may also recommend therapy or replacement products to help you quit.  Pulmonary rehabilitation. This may involve working with a team of health care providers and specialists, such as respiratory, occupational, and physical therapists.  Exercise and physical activity. These are beneficial for nearly all people with COPD.  Nutrition therapy to gain weight, if you are underweight.  Oxygen. Supplemental oxygen therapy is only helpful if you have a low oxygen level in your blood (hypoxemia).  Lung surgery or transplant.  Palliative care. This is to help people with COPD feel comfortable when treatment is no longer working. Follow these instructions at home: Medicines  Take over-the-counter and prescription medicines (inhaled or pills) only as told by your health care provider.  Talk to your health care provider before taking any cough or allergy medicines. You may need to avoid certain medicines that dry out your airways. Lifestyle  If you are a smoker, the most important thing that you can do is  to stop smoking. Do not use any products that contain nicotine or tobacco, such as cigarettes and e-cigarettes. If you need help quitting, ask your health care provider. Continuing to smoke will cause  the disease to progress faster.  Avoid exposure to things that irritate your lungs, such as smoke, chemicals, and fumes.  Stay active, but balance activity with periods of rest. Exercise and physical activity will help you maintain your ability to do things you want to do.  Learn and use relaxation techniques to manage stress and to control your breathing.  Get the right amount of sleep and get quality sleep. Most adults need 7 or more hours per night.  Eat healthy foods. Eating smaller, more frequent meals and resting before meals may help you maintain your strength. Controlled breathing Learn and use controlled breathing techniques as directed by your health care provider. Controlled breathing techniques include:  Pursed lip breathing. Start by breathing in (inhaling) through your nose for 1 second. Then, purse your lips as if you were going to whistle and breathe out (exhale) through the pursed lips for 2 seconds.  Diaphragmatic breathing. Start by putting one hand on your abdomen just above your waist. Inhale slowly through your nose. The hand on your abdomen should move out. Then purse your lips and exhale slowly. You should be able to feel the hand on your abdomen moving in as you exhale. Controlled coughing Learn and use controlled coughing to clear mucus from your lungs. Controlled coughing is a series of short, progressive coughs. The steps of controlled coughing are: 1. Lean your head slightly forward. 2. Breathe in deeply using diaphragmatic breathing. 3. Try to hold your breath for 3 seconds. 4. Keep your mouth slightly open while coughing twice. 5. Spit any mucus out into a tissue. 6. Rest and repeat the steps once or twice as needed. General instructions  Make sure you receive all the vaccines that your health care provider recommends, especially the pneumococcal and influenza vaccines. Preventing infection and hospitalization is very important when you have COPD.  Use  oxygen therapy and pulmonary rehabilitation if directed to by your health care provider. If you require home oxygen therapy, ask your health care provider whether you should purchase a pulse oximeter to measure your oxygen level at home.  Work with your health care provider to develop a COPD action plan. This will help you know what steps to take if your condition gets worse.  Keep other chronic health conditions under control as told by your health care provider.  Avoid extreme temperature and humidity changes.  Avoid contact with people who have an illness that spreads from person to person (is contagious), such as viral infections or pneumonia.  Keep all follow-up visits as told by your health care provider. This is important. Contact a health care provider if:  You are coughing up more mucus than usual.  There is a change in the color or thickness of your mucus.  Your breathing is more labored than usual.  Your breathing is faster than usual.  You have difficulty sleeping.  You need to use your rescue medicines or inhalers more often than expected.  You have trouble doing routine activities such as getting dressed or walking around the house. Get help right away if:  You have shortness of breath while you are resting.  You have shortness of breath that prevents you from: ? Being able to talk. ? Performing your usual physical activities.  You have chest pain lasting  longer than 5 minutes.  Your skin color is more blue (cyanotic) than usual.  You measure low oxygen saturations for longer than 5 minutes with a pulse oximeter.  You have a fever.  You feel too tired to breathe normally. Summary  Chronic obstructive pulmonary disease (COPD) is a long-term (chronic) condition that affects the lungs.  Your lung function will probably never return to normal. In most cases, it gets worse over time. However, there are steps you can take to slow the progression of the disease  and improve your quality of life.  Treatment for COPD may include taking medicines, quitting smoking, pulmonary rehabilitation, and changes to diet and exercise. As the disease progresses, you may need oxygen therapy, a lung transplant, or palliative care.  To help manage your condition, do not smoke, avoid exposure to things that irritate your lungs, stay up to date on all vaccines, and follow your health care provider's instructions for taking medicines. This information is not intended to replace advice given to you by your health care provider. Make sure you discuss any questions you have with your health care provider. Document Released: 05/20/2005 Document Revised: 07/23/2017 Document Reviewed: 09/14/2016 Elsevier Patient Education  2020 [employer].

## 2019-03-13 ENCOUNTER — Ambulatory Visit: Payer: Medicare HMO | Admitting: Family Medicine

## 2019-03-16 ENCOUNTER — Ambulatory Visit (INDEPENDENT_AMBULATORY_CARE_PROVIDER_SITE_OTHER): Payer: Medicare HMO | Admitting: Internal Medicine

## 2019-03-16 ENCOUNTER — Other Ambulatory Visit: Payer: Self-pay

## 2019-03-16 ENCOUNTER — Encounter: Payer: Self-pay | Admitting: Internal Medicine

## 2019-03-16 DIAGNOSIS — J449 Chronic obstructive pulmonary disease, unspecified: Secondary | ICD-10-CM

## 2019-03-16 DIAGNOSIS — I509 Heart failure, unspecified: Secondary | ICD-10-CM | POA: Diagnosis not present

## 2019-03-16 DIAGNOSIS — J9611 Chronic respiratory failure with hypoxia: Secondary | ICD-10-CM

## 2019-03-16 MED ORDER — BEVESPI AEROSPHERE 9-4.8 MCG/ACT IN AERO: 2.0000 | INHALATION_SPRAY | Freq: Two times a day (BID) | RESPIRATORY_TRACT | 11 refills | Status: DC

## 2019-03-16 MED ORDER — BEVESPI AEROSPHERE 9-4.8 MCG/ACT IN AERO: 2.0000 | INHALATION_SPRAY | Freq: Two times a day (BID) | RESPIRATORY_TRACT | 0 refills | Status: DC

## 2019-03-16 NOTE — Assessment & Plan Note (Signed)
Stopped smoking 2012  - PFT's    06/10/11  FEV1 1.74 (56 % ) ratio 0.67  p 20 % improvement from saba p ? prior to study - 03/16/2019  After extensive coaching inhaler device,  effectiveness =    25% with hfa > gave 2 samples of bevespi  - 03/16/2019   Walked RA x 2 laps =  approx 250 ft each  - stopped due to  End of study but sats 88% p first lap  At mod pace   Pt minimizing symptoms and using strategy of avoiding doe by avoiding activities "I can drive to get my mail when I want it"   - by hx apparently Pt is Group B in terms of symptom/risk and laba/lama therefore appropriate rx at this point >>>  Try bevespi 2bid then consider stiolto or anoro next depending on preference/ insurance reimbursement.      Total time devoted to counseling  > 50 % of initial 60 min office visit:  review case with pt/ directly observed portions of ambulatory 02 saturation study/ Performed device teaching which extended face to face time for this visit  discussion of options/alternatives/ personally creating written customized instructions  in presence of pt  then going over those specific  Instructions directly with the pt including how to use all of the meds but in particular covering each new medication in detail and the difference between the maintenance= "automatic" meds and the prns using an action plan format for the latter (If this problem/symptom => do that organization reading Left to right).  Please see AVS from this visit for a full list of these instructions which I personally wrote for this pt and  are unique to this visit.

## 2019-03-16 NOTE — Assessment & Plan Note (Signed)
desats walking nl pace p 250 ft but refuses to wear 02   Advised pacing / consider rehab but he declines   Discussed in detail all the  indications, usual  risks and alternatives  relative to the benefits with patient who agrees to proceed with no 02 at this point

## 2019-03-16 NOTE — Patient Instructions (Addendum)
Beevespi  Take 2 puffs first thing in am and then another 2 puffs about 12 hours later.   Work on inhaler technique:  relax and gently blow all the way out then take a nice smooth deep breath back in, triggering the inhaler at same time you start breathing in.  Hold for up to 5 seconds if you can.   Rinse and gargle with water when done  If not happy with bevespi then we can try other devices but it will require an office visit

## 2019-03-16 NOTE — Progress Notes (Signed)
Michael [name], male    DOB: [birth year],   MRN: [id number]   Brief patient profile:  24 yowm quit smoking 2012 p cabg then breathing deteriorated post op with sensation of throat closing up post op and did not do rehab at wt 190-200 lb and placed 02  d/c from [facility] April 2018 up to 4lpm during the day/ does not take inhalers or check his 02 level with documented COPD GOLD II with 20% p bronchodilators   Has never seen throat doctor and doesn't want to now though says throat never right since CABG     History of Present Illness  03/16/2019  Pulmonary/ 1st office eval/Michael [name]  Chief Complaint  Patient presents with  . Pulmonary Consult    Referred by Dr. Raliegh [name] for eval of COPD.   Dyspnea:  Room to room some better on 02, was last walking mailbox in spring s 02  Cough: not much at all  Sleep: s 02 feels fine  Sleeping flat  SABA use: none   No obvious day to day or daytime variability or assoc excess/ purulent sputum or mucus plugs or hemoptysis or cp or chest tightness, subjective wheeze or overt sinus or hb symptoms.   Sleeping  without nocturnal  or early am exacerbation  of respiratory  c/o's or need for noct saba. Also denies any obvious fluctuation of symptoms with weather or environmental changes or other aggravating or alleviating factors except as outlined above   No unusual exposure hx or h/o childhood pna/ asthma or knowledge of premature birth.  Current Allergies, Complete Past Medical History, Past Surgical History, Family History, and Social History were reviewed in [employer] record.  ROS  The following are not active complaints unless bolded Hoarseness, sore throat, dysphagia, dental problems, itching, sneezing,  nasal congestion or discharge of excess mucus or purulent secretions, ear ache,   fever, chills, sweats, unintended wt loss or wt gain, classically pleuritic or exertional cp,  orthopnea pnd or arm/hand swelling  or leg swelling, presyncope,  palpitations, abdominal pain, anorexia, nausea, vomiting, diarrhea  or change in bowel habits or change in bladder habits, change in stools or change in urine, dysuria, hematuria,  rash, arthralgias, visual complaints, headache, numbness, weakness or ataxia or problems with walking or coordination,  change in mood or  memory.             Past Medical History:  Diagnosis Date  . Arthritis    "right hip; right wrist" (11/24/2016)  . CAD, multiple vessel 05/2011   90% mid LAD with diffuse proximal stenosis, 100% proximal RCA --> s/p CABG x 2 (LIMA-LAD, SVG-dRCA); b) Lexiscan Myoview 09/2016 w/ Inferior-Inferolateral defect -> Cath:  11/24/16: 100% CTO of SVG-RCA (but nagtive /RCA only subtotal occlusion) --> PCI with DES x2 (resolute onyx DES 2.25 mm x 30 mm, 2.5 mm x 20 mm) RCA  . Carotid artery occlusion   . COPD (chronic obstructive pulmonary disease) with emphysema ([place]   . Essential hypertension   . History of gout   . History of tobacco abuse    quit 05/2011  . Hyperlipidemia with target LDL less than 70   . Hypothyroidism   . MI (myocardial infarction) ([place]) 05/2011   "I had 3 in 2 days"  . NSTEMI (non-ST elevated myocardial infarction) ([place]) 06/05/2011   With CHF - probably late presentation for missed STEMI --> CATH- MV CAD;; Echo 07/2001: EF > 55%, mild MAC, mild TR, no WMA  with Post-op  Setpal dyskinesis, Apex not seen)  . PAD (peripheral artery disease) ([place]) 06/08/2011   a) R Common Iliac 100%, L Common Iliac 80+% occlusion; b) L CIA - Aterectomy- 10 mm x 40 mm self-extending Stent (postdilated to 7 mm with 20-30% residual)--> Deferred R AoFem bypass;; c) LEA Dopplers 04/2012 & 04/2014 - RABI 0.8 & LABI 1.1 - STABLE, no change  . Pneumonia 1940s X 1  . Presence of drug coated stent in right coronary artery 11/24/2016   p-mRCA: Resolute Onyx DES 2.25 mm x 38 mm --> 2.5 mm x 18 mm (post-dilated from 2.7-2.8 mm)    Outpatient Medications Prior to Visit  Medication Sig Dispense Refill   . atorvastatin (LIPITOR) 20 MG tablet TAKE 1 TABLET BY MOUTH ALTERNATING WITH TWO TABLETS EVERY OTHER DAY AT BEDTIME 180 tablet 3  . clopidogrel (PLAVIX) 75 MG tablet TAKE 1 TABLET BY MOUTH DAILY 90 tablet 1  . fenofibrate (TRICOR) 48 MG tablet Take 1 tablet (48 mg total) by mouth daily. 90 tablet 3  . levothyroxine (SYNTHROID) 75 MCG tablet Take 1 tablet (75 mcg total) by mouth daily. 90 tablet 1  . metoprolol tartrate (LOPRESSOR) 25 MG tablet TAKE 1 TABLET BY MOUTH TWICE DAILY 60 tablet 11  . nitroGLYCERIN (NITROSTAT) 0.4 MG SL tablet Place 1 tablet (0.4 mg total) under the tongue every 5 (five) minutes as needed. After 3 doses, if no relief dial 911 30 tablet 0  . Vitamin D, Ergocalciferol, (DRISDOL) 1.25 MG (50000 UT) CAPS capsule TAKE 1 CAPSULE BY MOUTH EVERY 7 DAYS 12 capsule 3      Objective:     BP 132/80 (BP Location: Left Arm, Cuff Size: Normal)   Pulse 68   Temp 97.9 F (36.6 C) (Oral)   Ht _0  (1.702 m)   Wt 220 lb (99.8 kg)   SpO2 95%   BMI 34.46 kg/m   SpO2: 95 %  RA    Hoarse mod obese wm nad very neg affect/ attitude  HEENT: nl dentition / oropharynx. Nl external ear canals without cough reflex -  Mild bilateral non-specific turbinate edema     NECK :  without JVD/Nodes/TM/ nl carotid upstrokes bilaterally   LUNGS: no acc muscle use,  Mod barrel  contour chest wall with bilateral  Distant bs s audible wheeze and  without cough on insp or exp maneuver and mod  Hyperresonant  to  percussion bilaterally     CV:  RRR  no s3 or murmur or increase in P2, and no edema   ABD:  Obese soft and nontender with pos mid insp Hoover's  in the supine position. No bruits or organomegaly appreciated, bowel sounds nl  MS:     ext warm without deformities, calf tenderness, cyanosis or clubbing No obvious joint restrictions   SKIN: warm and dry without lesions    NEURO:  alert, approp, nl sensorium with  no motor or cerebellar deficits apparent.         I personally  reviewed images and agree with radiology impression as follows:   Chest LDSCT:  04/29/2018  Lungs/Pleura: Moderate centrilobular and paraseptal emphysematous changes, upper lobe predominant. Superimposed subpleural reticulation/fibrosis, suggesting chronic interstitial lung disease. Rounded masslike opacity in the posterior left lower lobe measures 1.7 x 4.1 cm, stable versus mildly decreased, favoring rounded atelectasis. Associated trace pleural effusion with pleural thickening, chronic. Lung-RADS 2, benign appearance or behavior. Continue annual screening with low-dose chest CT without contrast in 12 months.  Assessment   COPD  GOLD II with reversibility  Stopped smoking 2012  - PFT's    06/10/11  FEV1 1.74 (56 % ) ratio 0.67  p 20 % improvement from saba p ? prior to study - 03/16/2019  After extensive coaching inhaler device,  effectiveness =    25% with hfa > gave 2 samples of bevespi  - 03/16/2019   Walked RA x 2 laps =  approx 250 ft each  - stopped due to  End of study but sats 88% p first lap  At mod pace   Pt minimizing symptoms and using strategy of avoiding doe by avoiding activities "I can drive to get my mail when I want it"   - by hx apparently Pt is Group B in terms of symptom/risk and laba/lama therefore appropriate rx at this point >>>  Try bevespi 2bid then consider stiolto or anoro next depending on preference/ insurance reimbursement.       Chronic respiratory failure with hypoxia (HCC) - SaO2 drops to 87% with ambulation desats walking nl pace p 250 ft but refuses to wear 02   Advised pacing / consider rehab but he declines   Discussed in detail all the  indications, usual  risks and alternatives  relative to the benefits with patient who agrees to proceed with no 02 at this point      Total time devoted to counseling  > 50 % of initial 60 min office visit:  review case with pt/ directly observed portions of ambulatory 02 saturation study/ Performed  device teaching which extended face to face time for this visit  discussion of options/alternatives/ personally creating written customized instructions  in presence of pt  then going over those specific  Instructions directly with the pt including how to use all of the meds but in particular covering each new medication in detail and the difference between the maintenance= "automatic" meds and the prns using an action plan format for the latter (If this problem/symptom => do that organization reading Left to right).  Please see AVS from this visit for a full list of these instructions which I personally wrote for this pt and  are unique to this visit.        Christinia Gully, MD 03/16/2019

## 2019-03-31 DIAGNOSIS — J449 Chronic obstructive pulmonary disease, unspecified: Secondary | ICD-10-CM | POA: Diagnosis not present

## 2019-04-14 ENCOUNTER — Other Ambulatory Visit: Payer: Self-pay | Admitting: Cardiology

## 2019-05-01 DIAGNOSIS — J449 Chronic obstructive pulmonary disease, unspecified: Secondary | ICD-10-CM | POA: Diagnosis not present

## 2019-05-04 ENCOUNTER — Ambulatory Visit
Admission: RE | Admit: 2019-05-04 | Discharge: 2019-05-04 | Disposition: A | Discharge status: Home / Self Care | Payer: Medicare HMO | Source: Ambulatory Visit | Attending: Family Medicine | Admitting: Family Medicine

## 2019-05-04 ENCOUNTER — Other Ambulatory Visit: Payer: Self-pay

## 2019-05-04 DIAGNOSIS — R918 Other nonspecific abnormal finding of lung field: Secondary | ICD-10-CM

## 2019-05-04 DIAGNOSIS — IMO0001 Reserved for inherently not codable concepts without codable children: Secondary | ICD-10-CM

## 2019-05-04 DIAGNOSIS — Z122 Encounter for screening for malignant neoplasm of respiratory organs: Secondary | ICD-10-CM

## 2019-05-04 DIAGNOSIS — Z87891 Personal history of nicotine dependence: Secondary | ICD-10-CM

## 2019-05-04 DIAGNOSIS — R911 Solitary pulmonary nodule: Secondary | ICD-10-CM

## 2019-05-31 DIAGNOSIS — J449 Chronic obstructive pulmonary disease, unspecified: Secondary | ICD-10-CM | POA: Diagnosis not present

## 2019-06-06 ENCOUNTER — Other Ambulatory Visit (HOSPITAL_COMMUNITY): Payer: Self-pay | Admitting: Cardiology

## 2019-06-06 ENCOUNTER — Ambulatory Visit (HOSPITAL_COMMUNITY)
Admission: RE | Admit: 2019-06-06 | Discharge: 2019-06-06 | Disposition: A | Discharge status: Home / Self Care | Payer: Medicare HMO | Source: Ambulatory Visit | Attending: Cardiovascular Disease | Admitting: Cardiovascular Disease

## 2019-06-06 ENCOUNTER — Ambulatory Visit (HOSPITAL_BASED_OUTPATIENT_CLINIC_OR_DEPARTMENT_OTHER)
Admission: RE | Admit: 2019-06-06 | Discharge: 2019-06-06 | Disposition: A | Discharge status: Home / Self Care | Payer: Medicare HMO | Source: Ambulatory Visit | Attending: Cardiology | Admitting: Cardiology

## 2019-06-06 ENCOUNTER — Other Ambulatory Visit: Payer: Self-pay

## 2019-06-06 DIAGNOSIS — I739 Peripheral vascular disease, unspecified: Secondary | ICD-10-CM | POA: Insufficient documentation

## 2019-06-06 DIAGNOSIS — I779 Disorder of arteries and arterioles, unspecified: Secondary | ICD-10-CM | POA: Insufficient documentation

## 2019-06-06 DIAGNOSIS — Z95828 Presence of other vascular implants and grafts: Secondary | ICD-10-CM

## 2019-06-15 ENCOUNTER — Ambulatory Visit (INDEPENDENT_AMBULATORY_CARE_PROVIDER_SITE_OTHER): Payer: Medicare HMO | Admitting: Cardiology

## 2019-06-15 ENCOUNTER — Other Ambulatory Visit: Payer: Self-pay

## 2019-06-15 ENCOUNTER — Encounter: Payer: Self-pay | Admitting: Cardiology

## 2019-06-15 VITALS — BP 132/80 | HR 62 | Ht 67.0 in | Wt 218.6 lb

## 2019-06-15 DIAGNOSIS — I10 Essential (primary) hypertension: Secondary | ICD-10-CM

## 2019-06-15 DIAGNOSIS — I739 Peripheral vascular disease, unspecified: Secondary | ICD-10-CM | POA: Diagnosis not present

## 2019-06-15 DIAGNOSIS — J449 Chronic obstructive pulmonary disease, unspecified: Secondary | ICD-10-CM | POA: Diagnosis not present

## 2019-06-15 DIAGNOSIS — J9611 Chronic respiratory failure with hypoxia: Secondary | ICD-10-CM

## 2019-06-15 DIAGNOSIS — I700 Atherosclerosis of aorta: Secondary | ICD-10-CM | POA: Diagnosis not present

## 2019-06-15 DIAGNOSIS — E785 Hyperlipidemia, unspecified: Secondary | ICD-10-CM

## 2019-06-15 DIAGNOSIS — Z955 Presence of coronary angioplasty implant and graft: Secondary | ICD-10-CM

## 2019-06-15 DIAGNOSIS — I25118 Atherosclerotic heart disease of native coronary artery with other forms of angina pectoris: Secondary | ICD-10-CM

## 2019-06-15 MED ORDER — CLOPIDOGREL BISULFATE 75 MG PO TABS: 75.0000 mg | ORAL_TABLET | Freq: Every day | ORAL | 3 refills | Status: DC

## 2019-06-15 MED ORDER — FENOFIBRATE 48 MG PO TABS: 48.0000 mg | ORAL_TABLET | Freq: Every day | ORAL | 3 refills | Status: DC

## 2019-06-15 MED ORDER — METOPROLOL TARTRATE 25 MG PO TABS: 25.0000 mg | ORAL_TABLET | Freq: Two times a day (BID) | ORAL | 3 refills | Status: DC

## 2019-06-15 MED ORDER — ATORVASTATIN CALCIUM 20 MG PO TABS: ORAL_TABLET | ORAL | 3 refills | Status: DC

## 2019-06-15 NOTE — Patient Instructions (Addendum)

## 2019-06-15 NOTE — Progress Notes (Signed)
PCP: Mellody Dance, DO  Clinic Note: Chief Complaint  Patient presents with  . Follow-up    No change in symptoms  . Coronary Artery Disease    No angina  . PAD    No real claudication  . COPD    Major issue     HPI:   Michael [name] is a 73 y.o. male with a PMH notable for multivessel CAD-CABG in the setting of MI, PAD with iliac stenting and COPD Gold II (at a minimum-on home oxygen that Michael [name] uses intermittently) below who presents today for annual follow-up.  Michael [name] was last seen on in October 2020 819.  Was stable.  Only noting issues from pulmonary issues/COPD.  No angina.  Recent Hospitalizations: None --> Was seen by Dr. Melvyn Novas from pulmonary medicine back in July Michael [name] felt like this entire visit was worthless.  Michael [name] indicates that Michael [name] has not had any benefit from inhalers in the past and felt like Michael [name] was not interested in trying them now.  Reviewed  CV studies:    The following studies were reviewed today: (if available, images/films reviewed: From Epic Chart or Care Everywhere) . Aorto-Iliac Duplex - stable   Interval History:   Michael [name] returns today pretty stable from a cardiac standpoint.  Michael [name] tells me that his major issues are all related to his lungs.  Michael [name] basically says that nothing from a medicine standpoint is really made him feel better.  Michael [name] has tried inhalers in the past and does not feel like they help.  Michael [name] uses oxygen intermittently, and is a little bit upset that the nurse at the pulmonary office told him that Michael [name] did not need to wear his oxygen all the time.  Michael [name] does not like to carry the oxygen around with him when Michael [name] goes out.  As such, Michael [name] really does not do much away from the house.  Michael [name] states that the COVID-19 restrictions have not really changed his lifestyle and that Michael [name] is pretty much a loner anyway staying at home.  Michael [name] does have a friend who comes over to visit intermittently.  Michael [name] really says that Michael [name] has not had any chest discomfort and that his breathing  did improve after the last PCI, but nothing since  Michael [name] says Michael [name] sleeps with oxygen in place, and oftentimes uses the oxygen at home to help him get around.  Michael [name] does not like carrying the oxygen with him when Michael [name] goes out. Michael [name] says Michael [name] gets short of breath walking to his garage to get his lawnmower out.  Michael [name] then has to rest after getting his lawnmower out before getting on the lawnmower to mow his lawn.  After a few minutes when Michael [name] can catch his breath Michael [name] is okay.  Michael [name] therefore is started using his oxygen for this activity.  As a result of his dyspnea, Michael [name] has become quite sedentary and has definitely put on weight over the course of several years.  Michael [name] tells me Michael [name] was happy because Michael [name] lost 2 pounds since pulmonary visit.  Michael [name] says that Michael [name] still does some low intensity yard work, but really cannot walk to the mailbox without oxygen.  Michael [name] has a stop at least twice.  In the heat Michael [name] does simply does not go out.  Cardiovascular review of symptoms: (Summary) positive for - dyspnea on exertion, shortness of breath and If Michael [name] walks enough, claudication, but stable more limited by breathing then claudication negative for - chest pain, edema, irregular heartbeat,  orthopnea, palpitations, paroxysmal nocturnal dyspnea, rapid heart rate or Syncope/near syncope, TIA/amaurosis fugax  The patient does not have symptoms concerning for COVID-19 infection (fever, chills, cough, or new shortness of breath).  Michael [name] has chronic shortness of breath, nothing new The patient is practicing social distancing. ++ Masking.  Michael [name] does not go out for groceries/shopping Michael [name] does have family that brings him groceries.Marland Kitchen    REVIEWED OF SYSTEMS   ROS: A comprehensive was performed. Review of Systems  Constitutional: Positive for malaise/fatigue (Maybe more just that Michael [name] does not feel like doing things because everything makes him short of breath). Negative for weight loss (Has actually gained weight, but stable recently).  HENT: Negative for  congestion and nosebleeds.   Respiratory: Positive for cough, shortness of breath and wheezing. Negative for sputum production.        No real change from baseline  Gastrointestinal: Negative for blood in stool, constipation, diarrhea, heartburn and melena.  Genitourinary: Negative for hematuria.  Musculoskeletal: Positive for joint pain (Normal arthritis pains).  Neurological: Positive for dizziness (If Michael [name] gets up quickly) and weakness (Generalized). Negative for focal weakness.  Endo/Heme/Allergies: Negative for environmental allergies.  Psychiatric/Behavioral: Negative for memory loss. The patient is not nervous/anxious and does not have insomnia.        Michael [name] clearly has some symptoms of dysthymia with anhedonia and isolation.  Michael [name] seems to be resolved to the fact that his fate is set given his lung disease.  All other systems reviewed and are negative.  I have reviewed and (if needed) personally updated the patient's problem list, medications, allergies, past medical and surgical history, social and family history.   PAST MEDICAL HISTORY   Past Medical History:  Diagnosis Date  . Arthritis    "right hip; right wrist" (11/24/2016)  . CAD, multiple vessel 05/2011   90% mid LAD with diffuse proximal stenosis, 100% proximal RCA --> s/p CABG x 2 (LIMA-LAD, SVG-dRCA); b) Lexiscan Myoview 09/2016 w/ Inferior-Inferolateral defect -> Cath:  11/24/16: 100% CTO of SVG-RCA (but nagtive /RCA only subtotal occlusion) --> PCI with DES x2 (resolute onyx DES 2.25 mm x 30 mm, 2.5 mm x 20 mm) RCA  . Carotid artery occlusion   . COPD (chronic obstructive pulmonary disease) with emphysema ([place])   . Essential hypertension   . History of gout   . History of tobacco abuse    quit 05/2011  . Hyperlipidemia with target LDL less than 70   . Hypothyroidism   . MI (myocardial infarction) ([place]) 05/2011   "I had 3 in 2 days"  . NSTEMI (non-ST elevated myocardial infarction) ([place]) 06/05/2011   With CHF - probably late  presentation for missed STEMI --> CATH- MV CAD;; Echo 07/2001: EF > 55%, mild MAC, mild TR, no WMA  with Post-op Setpal dyskinesis, Apex not seen)  . PAD (peripheral artery disease) ([place]) 06/08/2011   a) R Common Iliac 100%, L Common Iliac 80+% occlusion; b) L CIA - Aterectomy- 10 mm x 40 mm self-extending Stent (postdilated to 7 mm with 20-30% residual)--> Deferred R AoFem bypass;; c) LEA Dopplers 04/2012 & 04/2014 - RABI 0.8 & LABI 1.1 - STABLE, no change  . Pneumonia 1940s X 1  . Presence of drug coated stent in right coronary artery 11/24/2016   p-mRCA: Resolute Onyx DES 2.25 mm x 38 mm --> 2.5 mm x 18 mm (post-dilated from 2.7-2.8 mm)    PAST SURGICAL HISTORY   Past Surgical History:  Procedure Laterality Date  . CORONARY  ANGIOPLASTY WITH STENT PLACEMENT  11/24/2016   with 100% SVG-RCA & flow in native RCA now present --> PCI-p-m RCA 2 Overlapping Resolute DES (2.25 x 38, 1.5 x 18)  . CORONARY ARTERY BYPASS GRAFT  06/10/2011   oLIMA to LAD, SVG to PDA (Dr. Servando Snare)  . CORONARY STENT INTERVENTION N/A 11/24/2016   Procedure: Coronary Stent Intervention;  Surgeon: Leonie Man, MD;  Location: [facility] INVASIVE CV LAB: [place] 2.25X38 & overlapping DES RESOLUTE ONYX 2.5X18   . ILIAC ARTERY STENT  10/27/2011   (Dr. Ellyn Hack Dr. Gwenlyn Found) left CIA - 41mx40mm self-expanding stent, decreasing downt to 20-30% lesion, 100% occluded R CIA with extensive lumbar collateralization to R internal iliac;  05/2016: Ao-Ileac & LEA Dopplers: RABI 0.8, LABI 1.2. B TBI 0.77 -- Michael [name] has known B disease s/p L CIA stent. RLABI decreased and LABI is normal; aortoiliac atherosclerosis w/ CTO of R CIA but normal REIA.  .Marland KitchenLEFT HEART CATH AND CORONARY ANGIOGRAPHY  06/09/2011   90% mid LAD with diffuse proximal stenosis, 100% proximal RCA --> s/p CABG x 2 (LIMA-LAD, SVG-dRCA);  .Marland KitchenLEFT HEART CATH AND CORS/GRAFTS ANGIOGRAPHY N/A 11/24/2016   Procedure: LEFT HEART CATH AND CORS/GRAFTS ANGIOGRAPHY;  Surgeon: DLeonie Man  MD;  Location: [facility] INVASIVE CV LAB: 100% SVG-RCA, but now native RCA only sub-CTO with antegrade flow.  85% mLAD after D1. 40% oOM1 --> PCI of Native RCA  . NM MYOVIEW LTD  09/2016    EF 62%. Medium sized, moderate severity defect in the basal inferior, mid inferoseptal and mid inferior walls is partially reversible. - INTERMEDIATE RISK -- CATH  . PERCUTANEOUS STENT INTERVENTION Left 10/27/2011   Procedure: PERCUTANEOUS STENT INTERVENTION;  Surgeon: JLorretta Harp MD / DLeonie Man MD;  [facility];  Service: Cardiovascular;  Laterality: Left;  . TONSILLECTOMY  ~ 1963  . TRANSTHORACIC ECHOCARDIOGRAM  08/19/2011   EF=>55%; mild mitral annular calcif; mild TR   CATH-PCI 11/2016 - 2 Overlapping DES to previously CTO RCA      MEDICATIONS/ALLERGIES   Current Meds  Medication Sig  . atorvastatin (LIPITOR) 20 MG tablet TAKE 1 TABLET BY MOUTH ALTERNATING WITH TWO TABLETS EVERY OTHER DAY AT BEDTIME  . clopidogrel (PLAVIX) 75 MG tablet Take 1 tablet (75 mg total) by mouth daily.  . fenofibrate (TRICOR) 48 MG tablet Take 1 tablet (48 mg total) by mouth daily.  .Marland Kitchenlevothyroxine (SYNTHROID) 75 MCG tablet Take 1 tablet (75 mcg total) by mouth daily.  . metoprolol tartrate (LOPRESSOR) 25 MG tablet Take 1 tablet (25 mg total) by mouth 2 (two) times daily.  . nitroGLYCERIN (NITROSTAT) 0.4 MG SL tablet Place 1 tablet (0.4 mg total) under the tongue every 5 (five) minutes as needed. After 3 doses, if no relief dial 911  . Vitamin D, Ergocalciferol, (DRISDOL) 1.25 MG (50000 UT) CAPS capsule TAKE 1 CAPSULE BY MOUTH EVERY 7 DAYS  . [DISCONTINUED] atorvastatin (LIPITOR) 20 MG tablet TAKE 1 TABLET BY MOUTH ALTERNATING WITH TWO TABLETS EVERY OTHER DAY AT BEDTIME  . [DISCONTINUED] clopidogrel (PLAVIX) 75 MG tablet TAKE 1 TABLET BY MOUTH DAILY  . [DISCONTINUED] fenofibrate (TRICOR) 48 MG tablet Take 1 tablet (48 mg total) by mouth daily.  . [DISCONTINUED] metoprolol tartrate (LOPRESSOR) 25 MG tablet  TAKE 1 TABLET BY MOUTH TWICE DAILY    Allergies  Allergen Reactions  . Ace Inhibitors Cough    SOCIAL HISTORY/FAMILY HISTORY   Social History   Tobacco Use  . Smoking status:  Former Smoker    Packs/day: 2.00    Years: 51.50    Pack years: 103.00    Types: Cigarettes    Quit date: 06/08/2011    Years since quitting: 8.0  . Smokeless tobacco: Former Systems developer    Types: Chew    Quit date: 07/13/2003  Substance Use Topics  . Alcohol use: No    Comment: 11/24/2016 "nothing in 2 1/2 years; never an alcoholic"  . Drug use: No   Social History   Social History Narrative  . Not on file    family history includes CAD in his sister; Diabetes in his mother; Heart disease in his mother; Heart failure in his mother; Pneumonia in his father; Stroke in his sister.   OBJCTIVE -PE, EKG, labs   Wt Readings from Last 3 Encounters:  06/15/19 218 lb 9.6 oz (99.2 kg)  03/16/19 220 lb (99.8 kg)  03/09/19 223 lb (101.2 kg)    Physical Exam: BP 132/80   Pulse 62   Ht _0  (1.702 m)   Wt 218 lb 9.6 oz (99.2 kg)   BMI 34.24 kg/m  Physical Exam  Constitutional: Michael [name] is oriented to person, place, and time.  Chronically ill but nontoxic-appearing elderly gentleman.  No obvious distress.  Basal accessory muscle use of breathing but no respiratory distress.  Although Michael [name] has truncal obesity, Michael [name] has almost some temporal wasting  HENT:  Head: Normocephalic and atraumatic.  Neck: Normal range of motion. Neck supple. No hepatojugular reflux and no JVD present. Carotid bruit is not present.  Cardiovascular: Normal rate, regular rhythm, S1 normal and S2 normal.  Occasional extrasystoles are present. PMI is not displaced (Unable to palpate). Exam reveals distant heart sounds and decreased pulses (Palpable but significant decrease in the right -(trace).  Palpable 1+ left pedal pulses). Exam reveals no gallop and no friction rub.  No murmur heard. Pulmonary/Chest: No respiratory distress. Michael [name] has wheezes. Michael [name]  has no rales (Rhonchi no rales). Michael [name] exhibits no tenderness.  Increased AP diameter of the chest with prolonged expiratory phase; baseline accessory muscle use with bucket-handle breathing, but nonlabored  Abdominal: Soft. Bowel sounds are normal. Michael [name] exhibits no distension. There is no abdominal tenderness. There is no rebound.  Obese abdomen; unable to assess HSM  Musculoskeletal: Normal range of motion.        General: No edema.  Neurological: Michael [name] is alert and oriented to person, place, and time.     Adult ECG Report  Rate: 62 ;  Rhythm: normal sinus rhythm, sinus arrhythmia and RSR' /incRBBB consistent with RV conduction delay.  Otherwise normal axis, intervals and durations.;   Narrative Interpretation: Stable  Recent Labs: November 03, 2018: TC 131, TG 130, HDL 39, LDL 69.  A1c 5.7.  K+ 4.9, CR 1.46, Hgb 14.8.  Normal LFTs   ASSESSMENT/PLAN    Problem List Items Addressed This Visit    CAD, multiple vessel - status post CABG: LIMA-LAD, SVG-RPDA; PCI native RCA for 100% SVG-RCA - Primary (Chronic)    Very interesting documents that in the setting of his initial MI, Michael [name] had an occluded RCA filling via left to right collaterals.  Michael [name] had two-vessel CABG with vein to the RCA and LIMA to LAD.  Follow-up Myoview showed inferior ischemia, the vein graft to RCA was occluded but the native RCA actually had recanalized with collaterals from the left with better flow through the LIMA.  We were then able to revascularize the proximal to mid RCA restoring native flow.  Michael [name] has not had any further anginal symptoms.  Based on his significant COPD comorbidity, I probably would not do another ischemic evaluation unless his overall symptoms were to deteriorate.  His last intervention was in 2018, and we could consider stress test as early as 2022, however suspect that we will probably simply treat medically unless symptoms worsen.  Michael [name] is on a stable dose of atorvastatin and fenofibrate.  Currently taking  atorvastatin 20 mg 1 day and 40 mg 1 day.  Lipids look better.  On low-dose beta-blocker.  Not on ACE inhibitor/ARB because of borderline pressures in the past.  On Plavix alone. -->  Okay to hold Plavix for procedures      Relevant Medications   metoprolol tartrate (LOPRESSOR) 25 MG tablet   fenofibrate (TRICOR) 48 MG tablet   atorvastatin (LIPITOR) 20 MG tablet   Other Relevant Orders   EKG 12-Lead (Completed)   PAD (peripheral artery disease) - Right Common Iliac Occlusion, Left Common Iliac 80+% occlusion (Chronic)    Relatively stable ABIs.  Michael [name] has known right iliac occlusion and left iliac stent that did not fully open the left common iliac due to extensive calcification.  Michael [name] has significant collateral flow to the right leg.  Lumbar-internal iliac collateralization and therefore we chose to treat this medically. Michael [name] does not walk enough to get claudication.  As long as Michael [name] has no ulcers or lesions, no changes to medications. Continue current cardiac meds. Michael [name] attempted Pletal in the past, but that has been stopped.      Relevant Medications   metoprolol tartrate (LOPRESSOR) 25 MG tablet   fenofibrate (TRICOR) 48 MG tablet   atorvastatin (LIPITOR) 20 MG tablet   Other Relevant Orders   EKG 12-Lead (Completed)   Chronic respiratory failure with hypoxia (HCC) - SaO2 drops to 87% with ambulation (Chronic)   Relevant Orders   EKG 12-Lead (Completed)   Presence of drug coated stent in right coronary artery (Chronic)    On Plavix without aspirin.  This is both for PAD and CAD.  Okay to hold Plavix for procedures      Relevant Orders   EKG 12-Lead (Completed)   Essential hypertension (Chronic)    Blood pressure looks good on current meds.  No changes to current low-dose Lopressor.  Seems to be tolerating it from pulmonary standpoint.  Could consider converting to bisoprolol.      Relevant Medications   metoprolol tartrate (LOPRESSOR) 25 MG tablet   fenofibrate (TRICOR) 48 MG  tablet   atorvastatin (LIPITOR) 20 MG tablet   Hyperlipidemia with target LDL less than 70 (Chronic)    As of his most recent labs back in March, lipids look well-controlled.  Continue current dose of atorvastatin-alternating 20 mg 1 day, 40 mg next day along with fenofibrate.      Relevant Medications   metoprolol tartrate (LOPRESSOR) 25 MG tablet   fenofibrate (TRICOR) 48 MG tablet   atorvastatin (LIPITOR) 20 MG tablet   Aortic atherosclerosis (HCC)-found on CT scan done March 2019 (Chronic)    Not unexpectedly Michael [name] has aortic calcification and atherosclerosis but no signs of aneurysm.  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      Relevant Medications   metoprolol tartrate (LOPRESSOR) 25 MG tablet   fenofibrate (TRICOR) 48 MG tablet   atorvastatin (LIPITOR) 20 MG tablet   COPD  GOLD II with reversibility  (Chronic)    Reluctant to take any medications.  Essentially would continue home oxygen per pulmonary medicine  and PCP.  I think you would benefit from getting a concentrator, his major concern is the cost. A lighter weight portable oxygen container would probably give him warm mobility and freedom of getting back out into activities.          COVID-19 Education: The signs and symptoms of COVID-19 were discussed with the patient and how to seek care for testing (follow up with PCP or arrange E-visit).   The importance of social distancing was discussed today.  I spent a total of 65mnutes with the patient and chart review. >  50% of the time was spent in direct patient consultation.  Additional time spent with chart review (studies, outside notes, etc): 10 Total Time: 32 min   Current medicines are reviewed at length with the patient today.  (+/- concerns) none   Patient Instructions / Medication Changes & Studies & Tests Ordered   Patient Instructions  Medication Instructions:  NO CHANGES  *If you need a refill on your cardiac medications before your next  appointment, please call your pharmacy*  Lab Work: NOT NEEDED  Testing/Procedures: NOT NEEDED  Follow-Up: [facility].  As part of our continuing mission to provide you with exceptional heart care, we have created designated Provider Care Teams.  These Care Teams include your primary Cardiologist (physician) and Advanced Practice Providers (APPs -  Physician Assistants and Nurse Practitioners) who all work together to provide you with the care you need, when you need it.  Your next appointment:   12 months  The format for your next appointment:   In Person  Provider:   DGlenetta Hew MD  Other Instructions     Studies Ordered:   Orders Placed This Encounter  Procedures  . EKG 12-Lead     DGlenetta Hew M.D., M.S. Interventional Cardiologist   Pager # [phone] # [phone] [place] [id number]  [facility]!

## 2019-06-17 ENCOUNTER — Encounter: Payer: Self-pay | Admitting: Cardiology

## 2019-06-17 NOTE — Assessment & Plan Note (Addendum)
Reluctant to take any medications.  Essentially would continue home oxygen per pulmonary medicine and PCP.  I think you would benefit from getting a concentrator, his major concern is the cost. A lighter weight portable oxygen container would probably give him warm mobility and freedom of getting back out into activities.

## 2019-06-17 NOTE — Assessment & Plan Note (Signed)
On Plavix without aspirin.  This is both for PAD and CAD.  Okay to hold Plavix for procedures

## 2019-06-17 NOTE — Assessment & Plan Note (Addendum)
As of his most recent labs back in March, lipids look well-controlled.  Continue current dose of atorvastatin-alternating 20 mg 1 day, 40 mg next day along with fenofibrate.

## 2019-06-17 NOTE — Assessment & Plan Note (Signed)
Relatively stable ABIs.  He has known right iliac occlusion and left iliac stent that did not fully open the left common iliac due to extensive calcification.  He has significant collateral flow to the right leg.  Lumbar-internal iliac collateralization and therefore we chose to treat this medically. He does not walk enough to get claudication.  As long as he has no ulcers or lesions, no changes to medications. Continue current cardiac meds. He attempted Pletal in the past, but that has been stopped.

## 2019-06-17 NOTE — Assessment & Plan Note (Signed)
Not unexpectedly he has aortic calcification and atherosclerosis but no signs of aneurysm.  He had bilateral severe iliac disease.

## 2019-06-17 NOTE — Assessment & Plan Note (Signed)
Blood pressure looks good on current meds.  No changes to current low-dose Lopressor.  Seems to be tolerating it from pulmonary standpoint.  Could consider converting to bisoprolol.

## 2019-06-17 NOTE — Assessment & Plan Note (Signed)
Very interesting documents that in the setting of his initial MI, he had an occluded RCA filling via left to right collaterals.  He had two-vessel CABG with vein to the RCA and LIMA to LAD.  Follow-up Myoview showed inferior ischemia, the vein graft to RCA was occluded but the native RCA actually had recanalized with collaterals from the left with better flow through the LIMA.  We were then able to revascularize the proximal to mid RCA restoring native flow.  He has not had any further anginal symptoms.  Based on his significant COPD comorbidity, I probably would not do another ischemic evaluation unless his overall symptoms were to deteriorate.  His last intervention was in 2018, and we could consider stress test as early as 2022, however suspect that we will probably simply treat medically unless symptoms worsen.  He is on a stable dose of atorvastatin and fenofibrate.  Currently taking atorvastatin 20 mg 1 day and 40 mg 1 day.  Lipids look better.  On low-dose beta-blocker.  Not on ACE inhibitor/ARB because of borderline pressures in the past.  On Plavix alone. -->  Okay to hold Plavix for procedures

## 2019-06-27 DIAGNOSIS — I2510 Atherosclerotic heart disease of native coronary artery without angina pectoris: Secondary | ICD-10-CM | POA: Diagnosis not present

## 2019-06-27 DIAGNOSIS — J42 Unspecified chronic bronchitis: Secondary | ICD-10-CM | POA: Diagnosis not present

## 2019-06-27 DIAGNOSIS — J449 Chronic obstructive pulmonary disease, unspecified: Secondary | ICD-10-CM | POA: Diagnosis not present

## 2019-07-01 DIAGNOSIS — J449 Chronic obstructive pulmonary disease, unspecified: Secondary | ICD-10-CM | POA: Diagnosis not present

## 2019-07-10 ENCOUNTER — Other Ambulatory Visit: Payer: Self-pay

## 2019-07-10 ENCOUNTER — Ambulatory Visit (INDEPENDENT_AMBULATORY_CARE_PROVIDER_SITE_OTHER): Payer: Medicare HMO | Admitting: Family Medicine

## 2019-07-10 ENCOUNTER — Encounter: Payer: Self-pay | Admitting: Family Medicine

## 2019-07-10 VITALS — BP 134/83 | HR 83 | Temp 97.4°F | Resp 14 | Ht 67.0 in | Wt 222.2 lb

## 2019-07-10 DIAGNOSIS — E559 Vitamin D deficiency, unspecified: Secondary | ICD-10-CM | POA: Diagnosis not present

## 2019-07-10 DIAGNOSIS — Z87891 Personal history of nicotine dependence: Secondary | ICD-10-CM

## 2019-07-10 DIAGNOSIS — E039 Hypothyroidism, unspecified: Secondary | ICD-10-CM

## 2019-07-10 DIAGNOSIS — I10 Essential (primary) hypertension: Secondary | ICD-10-CM

## 2019-07-10 DIAGNOSIS — N1831 Chronic kidney disease, stage 3a: Secondary | ICD-10-CM | POA: Diagnosis not present

## 2019-07-10 DIAGNOSIS — R7303 Prediabetes: Secondary | ICD-10-CM

## 2019-07-10 DIAGNOSIS — I25118 Atherosclerotic heart disease of native coronary artery with other forms of angina pectoris: Secondary | ICD-10-CM | POA: Diagnosis not present

## 2019-07-10 NOTE — Progress Notes (Signed)
Impression and Recommendations:    1. Acquired hypothyroidism   2. Stage 3a chronic kidney disease   3. Pre-diabetes   4. History of tobacco abuse- 2+ppd for 50+ yrs- quit in Jun 08 2011   5. Essential hypertension   6. CAD, multiple vessel - status post CABG: LIMA-LAD, SVG-RPDA; PCI native RCA for 100% SVG-RCA   7. Vitamin D deficiency      Acquired hypothyroidism - Continue management as prescribed. - No changes made to treatment plan today. - Will continue to monitor and re-check as recommended.  Chronic Kidney Disease - Patient has seen nephrologist in the past. Asked him to tel front desk about this so we can get his records.  However patient states he does not recall who it was and appears to be unmotivated to find out - Per patient, does not care to follow-up with this and is not concerned about his kidney health.  Pre-diabetes - Counseled patient on prevention of diabetes and discussed dietary and lifestyle modification as first line.    - Importance of low carb, heart-healthy diet discussed with patient in addition to regular aerobic exercise of 5d/week or more.   - We will continue to monitor.  History of tobacco abuse- 2+ppd for 50+ yrs- quit in Jun 08 2011 - Patient last went to see Michael [name] on 03/16/2019. - Per patient, was given sample inhalers for treatment, but states "I couldn't get them to go into my lungs."  -  Per patient, feels follow-up with pulmonology was a "waste of time." - Patient refuses to go on oxygen and refuses to begin prescriptions as advised. - States does not want to follow up with pulmonology again.  - STRONGLY recommended that patient follow up with pulmonology and ask for assistance with recommended inhalers/prescriptions.  - Discussed that the damage from smoking for so many years cannot be reversed.  However, explained that appropriate therapy, including prescription inhalers and f/up through pulmonology, can help improve  the patient's quality of life.  - Long conversation held with patient today regarding appropriate treatment to enhance wellbeing.  Essential hypertension - BP stable at this time.  - Patient will continue current treatment regimen  - Counseled patient on pathophysiology of disease and discussed various treatment options, which always includes dietary and lifestyle modification as first line.   - Lifestyle changes such as dash and heart healthy diets and engaging in a regular exercise program discussed extensively with patient.   - Ambulatory blood pressure monitoring encouraged at least 3 times weekly.  Keep log and bring in every office visit.  Reminded patient that if they ever feel poorly in any way, to check their blood pressure and pulse.  - Handouts provided at patient's desire and/or told to go online at the American Heart Association website for further information  - We will continue to monitor  CAD, multiple vessel - status post CABG: LIMA-LAD, SVG-RPDA; PCI native RCA for 100% SVG-RCA - Refill of nitroglycerin provided last OV. - Discussed need for patient to have his prescription on hand in case of onset of chest pain. - Red flag symptoms reviewed with patient today.  Follow-Up - Last labs obtained November 03, 2018.  - Patient refuses lab work today. - Plan for Medicare Wellness with [facility] in March 2021.     No orders of the defined types were placed in this encounter.   No orders of the defined types were placed in this encounter.  There are no discontinued medications.   Gross side effects, risk and benefits, and alternatives of medications and treatment plan in general discussed with patient.  Patient is aware that all medications have potential side effects and we are unable to predict every side effect or drug-drug interaction that may occur.   Patient will call with any questions prior to using medication if they have concerns.    Expresses verbal  understanding and consents to current therapy and treatment regimen.  No barriers to understanding were identified.  Red flag symptoms and signs discussed in detail.  Patient expressed understanding regarding what to do in case of emergency\urgent symptoms  Please see AVS handed out to patient at the end of our visit for further patient instructions/ counseling done pertaining to today's office visit.   Return for Medicare Wellness and full fasting blood work March 2021.     Note:  This note was prepared with assistance of Dragon voice recognition software. Occasional wrong-word or sound-a-like substitutions may have occurred due to the inherent limitations of voice recognition software.   This document serves as a record of services personally performed by Michael Lot, DO. It was created on her behalf by Michael [name], a trained medical scribe. The creation of this record is based on the scribe's personal observations and the provider's statements to them.   This case required medical decision making of at least moderate complexity. The above documentation has been reviewed to be accurate and was completed by Michael [name], D.O.     --------------------------------------------------------------------------------------------------------------------------------------------------------------------------------------------------------------------------------------------    Subjective:    Michael [name], am serving as scribe for Dr. Thomasene [name].  HPI: Michael [name] is a 73 y.o. male who presents to [facility] today for issues as discussed below.  Notes "doing alright; I don't even know why I'm here!"  Denies chest pain / need for use of nitroglycerin.  States not up and moving, not very active; "not a whole [name] [of exercise] because I can't catch my breath."  Says he tries to walk a little bit every day.  - Breathing Evaluation,  Pulmonary Follow-Up Confirms that he went to see Michael [name] in July, but going to pulmonologist "wasn't nothin' but a waste of time and money."  Says he was given a prescription (inhaler) but "I never could get them to go down my throat."  Says "I wanted to walk as much as I could just to see how low my oxygen went."  Confirms he was provided with inhalers; says "they were samples and good for maybe a couple of days."  Says "I couldn't get it to go down into my lungs."  - Kidney Health Says "I eat everything I want, and everything I do eat is full of calories and salt."  Says "the kidney doctor told me there wasn't anything to worry about."  He isn't sure who he followed up with or when he last went to the kidney specialist.  "I don't remember what year it was."  HPI:  Hypertension:  -  His blood pressure at home has been running: "I never take my own blood pressure."  Says "the only thing I'm interested in is checking my oxygen."  - Patient reports good compliance with medication and/or lifestyle modification  - His denies acute concerns or problems related to treatment plan  - He denies new onset of: chest pain, exercise intolerance, shortness of breath, dizziness, visual changes, headache, lower extremity swelling or claudication.  Last 3 blood pressure readings in our office are as follows: BP Readings from Last 3 Encounters:  07/10/19 134/83  06/15/19 132/80  03/16/19 132/80   Filed Weights   07/10/19 0837  Weight: 222 lb 3.2 oz (100.8 kg)        Wt Readings from Last 3 Encounters:  07/10/19 222 lb 3.2 oz (100.8 kg)  06/15/19 218 lb 9.6 oz (99.2 kg)  03/16/19 220 lb (99.8 kg)   BP Readings from Last 3 Encounters:  07/10/19 134/83  06/15/19 132/80  03/16/19 132/80   Pulse Readings from Last 3 Encounters:  07/10/19 83  06/15/19 62  03/16/19 68   BMI Readings from Last 3 Encounters:  07/10/19 34.80 kg/m  06/15/19 34.24 kg/m  03/16/19 34.46 kg/m      Patient Care Team    Relationship Specialty Notifications Start End  Michael Lotpalski, Bria Portales, DO PCP - General Family Medicine  05/24/17   Marykay LexHarding, David W, MD PCP - Cardiology Cardiology Admissions 06/16/18   Marykay LexHarding, David W, MD Consulting Physician Cardiology  05/24/17      Patient Active Problem List   Diagnosis Date Noted  . History of tobacco abuse- 2+ppd for 50+ yrs- quit in Jun 08 2011 05/24/2017    Priority: High  . Hyperlipidemia with target LDL less than 70 05/21/2013    Priority: High  . Essential hypertension     Priority: High    Class: Diagnosis of  . Hypothyroidism     Priority: High    Class: Diagnosis of  . COPD (chronic obstructive pulmonary disease) (HCC) 06/10/2011    Priority: High    Class: Diagnosis of  . Lung nodule < 6cm on CT 10/2017-   Post L Lower Lobe 11/05/2017    Priority: Medium  . Chronic respiratory failure with hypoxia (HCC) - SaO2 drops to 87% with ambulation 06/18/2015    Priority: Medium  . CAD, multiple vessel - status post CABG: LIMA-LAD, SVG-RPDA; PCI native RCA for 100% SVG-RCA 06/08/2011    Priority: Medium    Class: History of  . Aortic atherosclerosis (HCC)-found on CT scan done March 2019 11/05/2017    Priority: Low  . Colonoscopy refused 05/24/2017    Priority: Low  . PAD (peripheral artery disease) - Right Common Iliac Occlusion, Left Common Iliac 80+% occlusion 10/22/2011    Priority: Low    Class: Diagnosis of  . COPD  GOLD II with reversibility  03/16/2019  . Pre-diabetes 03/09/2019  . Prediabetes 05/24/2018  . Vitamin D deficiency 05/24/2018  . Chronic kidney disease, stage 3, mod decreased GFR 05/24/2018  . Hyperkalemia 07/09/2017  . Noncompliance with immunization regimen 05/24/2017  . Personal history of noncompliance with medical treatment 05/24/2017  . Presence of drug coated stent in right coronary artery 11/24/2016  . HOH (hard of hearing) 11/25/2015  . Encounter for long-term (current) use of other medications  05/21/2013  . Atherosclerosis 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 12/10/2011  . S/P CABG x 2 06/10/2011    Class: History of    Past Medical history, Surgical history, Family history, Social history, Allergies and Medications have been entered into the medical record, reviewed and changed as needed.    Current Meds  Medication Sig  . atorvastatin (LIPITOR) 20 MG tablet TAKE 1 TABLET BY MOUTH ALTERNATING WITH TWO TABLETS EVERY OTHER DAY AT BEDTIME  . clopidogrel (PLAVIX) 75 MG tablet Take 1 tablet (75 mg total) by mouth daily.  . fenofibrate (TRICOR) 48 MG tablet  Take 1 tablet (48 mg total) by mouth daily.  Marland Kitchen levothyroxine (SYNTHROID) 75 MCG tablet Take 1 tablet (75 mcg total) by mouth daily.  . metoprolol tartrate (LOPRESSOR) 25 MG tablet Take 1 tablet (25 mg total) by mouth 2 (two) times daily.  . nitroGLYCERIN (NITROSTAT) 0.4 MG SL tablet Place 1 tablet (0.4 mg total) under the tongue every 5 (five) minutes as needed. After 3 doses, if no relief dial 911  . Vitamin D, Ergocalciferol, (DRISDOL) 1.25 MG (50000 UT) CAPS capsule TAKE 1 CAPSULE BY MOUTH EVERY 7 DAYS    Allergies:  Allergies  Allergen Reactions  . Ace Inhibitors Cough     Review of Systems:  A fourteen system review of systems was performed and found to be positive as per HPI.   Objective:   Blood pressure 134/83, pulse 83, temperature (!) 97.4 F (36.3 C), temperature source Oral, resp. rate 14, height 5\' 7"  (1.702 m), weight 222 lb 3.2 oz (100.8 kg), SpO2 90 %. Body mass index is 34.8 kg/m. General:  Well Developed, well nourished, appropriate for stated age.  Neuro:  Alert and oriented,  extra-ocular muscles intact  HEENT:  Normocephalic, atraumatic, neck supple, no carotid bruits appreciated  Skin:  no gross rash, warm, pink. Cardiac:  RRR, S1 S2 Respiratory:  ECTA B/L and A/P, Not using accessory muscles, speaking in full sentences- unlabored. Vascular:  Ext warm, no  cyanosis apprec.; cap RF less 2 sec. Psych:  No HI/SI, judgement and insight good, Euthymic mood. Full Affect.

## 2019-07-31 DIAGNOSIS — J449 Chronic obstructive pulmonary disease, unspecified: Secondary | ICD-10-CM | POA: Diagnosis not present

## 2019-08-31 DIAGNOSIS — J449 Chronic obstructive pulmonary disease, unspecified: Secondary | ICD-10-CM | POA: Diagnosis not present

## 2019-09-05 ENCOUNTER — Other Ambulatory Visit: Payer: Self-pay | Admitting: Family Medicine

## 2019-09-05 DIAGNOSIS — E559 Vitamin D deficiency, unspecified: Secondary | ICD-10-CM

## 2019-09-26 ENCOUNTER — Other Ambulatory Visit: Payer: Self-pay | Admitting: Cardiology

## 2019-10-01 DIAGNOSIS — J449 Chronic obstructive pulmonary disease, unspecified: Secondary | ICD-10-CM | POA: Diagnosis not present

## 2019-10-29 DIAGNOSIS — J449 Chronic obstructive pulmonary disease, unspecified: Secondary | ICD-10-CM | POA: Diagnosis not present

## 2019-11-22 ENCOUNTER — Other Ambulatory Visit: Payer: Self-pay | Admitting: Family Medicine

## 2019-11-22 DIAGNOSIS — E039 Hypothyroidism, unspecified: Secondary | ICD-10-CM

## 2019-11-29 DIAGNOSIS — J449 Chronic obstructive pulmonary disease, unspecified: Secondary | ICD-10-CM | POA: Diagnosis not present

## 2019-12-29 DIAGNOSIS — J449 Chronic obstructive pulmonary disease, unspecified: Secondary | ICD-10-CM | POA: Diagnosis not present

## 2020-01-11 ENCOUNTER — Other Ambulatory Visit: Payer: Self-pay | Admitting: Physician Assistant

## 2020-01-11 DIAGNOSIS — I10 Essential (primary) hypertension: Secondary | ICD-10-CM

## 2020-01-11 DIAGNOSIS — R7303 Prediabetes: Secondary | ICD-10-CM

## 2020-01-11 DIAGNOSIS — E785 Hyperlipidemia, unspecified: Secondary | ICD-10-CM

## 2020-01-11 DIAGNOSIS — E039 Hypothyroidism, unspecified: Secondary | ICD-10-CM

## 2020-01-15 ENCOUNTER — Other Ambulatory Visit: Payer: Medicare HMO

## 2020-01-15 ENCOUNTER — Ambulatory Visit (INDEPENDENT_AMBULATORY_CARE_PROVIDER_SITE_OTHER): Payer: Medicare HMO | Admitting: Physician Assistant

## 2020-01-15 ENCOUNTER — Other Ambulatory Visit: Payer: Self-pay

## 2020-01-15 ENCOUNTER — Encounter: Payer: Self-pay | Admitting: Physician Assistant

## 2020-01-15 DIAGNOSIS — E039 Hypothyroidism, unspecified: Secondary | ICD-10-CM | POA: Diagnosis not present

## 2020-01-15 DIAGNOSIS — Z0000 Encounter for general adult medical examination without abnormal findings: Secondary | ICD-10-CM

## 2020-01-15 DIAGNOSIS — E785 Hyperlipidemia, unspecified: Secondary | ICD-10-CM | POA: Diagnosis not present

## 2020-01-15 DIAGNOSIS — R7303 Prediabetes: Secondary | ICD-10-CM

## 2020-01-15 DIAGNOSIS — I10 Essential (primary) hypertension: Secondary | ICD-10-CM | POA: Diagnosis not present

## 2020-01-15 NOTE — Progress Notes (Signed)
Telehealth office visit  I connected with Michael [name] on 01/15/2020 by telephone and verified that I am speaking with the correct person using two identifiers.  . Location of the patient: Home . Location of the provider: Office   Subjective:   Michael [name] is a 74 y.o. male who presents for Medicare Annual/Subsequent preventive examination.  Review of Systems:  Review of Systems: General:   No F/C, wt loss Pulm:   No DIB, SOB, pleuritic chest pain Card:  No CP, palpitations Abd:  No n/v/d or pain Ext:  No inc edema from baseline  Objective:    Vitals: There were no vitals taken for this visit.  There is no height or weight on file to calculate BMI.  Advanced Directives 05/24/2017 11/24/2016 10/27/2011 10/27/2011  Does Patient Have a Medical Advance Directive? Yes No Patient does not have advance directive;Patient would not like information Patient does not have advance directive  Type of Advance Directive Living will;Healthcare Power of Attorney - - -  Would patient like information on creating a medical advance directive? - No - Patient declined - -    Tobacco Social History   Tobacco Use  Smoking Status Former Smoker  . Packs/day: 2.00  . Years: 51.50  . Pack years: 103.00  . Types: Cigarettes  . Quit date: 06/08/2011  . Years since quitting: 8.6  Smokeless Tobacco Former Systems developer  . Types: Chew  . Quit date: 07/13/2003     Counseling given: Not Answered    Past Medical History:  Diagnosis Date  . Arthritis    "right hip; right wrist" (11/24/2016)  . CAD, multiple vessel 05/2011   90% mid LAD with diffuse proximal stenosis, 100% proximal RCA --> s/p CABG x 2 (LIMA-LAD, SVG-dRCA); b) Lexiscan Myoview 09/2016 w/ Inferior-Inferolateral defect -> Cath:  11/24/16: 100% CTO of SVG-RCA (but nagtive /RCA only subtotal occlusion) --> PCI with DES x2 (resolute onyx DES 2.25 mm x 30 mm, 2.5 mm x 20 mm) RCA  . Carotid artery occlusion   . COPD (chronic obstructive pulmonary disease) with  emphysema ([place])   . Essential hypertension   . History of gout   . History of tobacco abuse    quit 05/2011  . Hyperlipidemia with target LDL less than 70   . Hypothyroidism   . MI (myocardial infarction) ([place]) 05/2011   "I had 3 in 2 days"  . NSTEMI (non-ST elevated myocardial infarction) ([place]) 06/05/2011   With CHF - probably late presentation for missed STEMI --> CATH- MV CAD;; Echo 07/2001: EF > 55%, mild MAC, mild TR, no WMA  with Post-op Setpal dyskinesis, [place] not seen)  . PAD (peripheral artery disease) ([place]) 06/08/2011   a) R Common Iliac 100%, L Common Iliac 80+% occlusion; b) L CIA - Aterectomy- 10 mm x 40 mm self-extending Stent (postdilated to 7 mm with 20-30% residual)--> Deferred R AoFem bypass;; c) LEA Dopplers 04/2012 & 04/2014 - RABI 0.8 & LABI 1.1 - STABLE, no change  . Pneumonia 1940s X 1  . Presence of drug coated stent in right coronary artery 11/24/2016   p-mRCA: Resolute Onyx DES 2.25 mm x 38 mm --> 2.5 mm x 18 mm (post-dilated from 2.7-2.8 mm)   Past Surgical History:  Procedure Laterality Date  . CORONARY ANGIOPLASTY WITH STENT PLACEMENT  11/24/2016   with 100% SVG-RCA & flow in native RCA now present --> PCI-p-m RCA 2 Overlapping Resolute DES (2.25 x 38, 1.5 x 18)  . CORONARY ARTERY BYPASS GRAFT  06/10/2011   oLIMA to LAD, SVG to PDA (Dr. Servando Snare)  . CORONARY STENT INTERVENTION N/A 11/24/2016   Procedure: Coronary Stent Intervention;  Surgeon: Leonie Man, MD;  Location: [facility] INVASIVE CV LAB: Christopher 2.25X38 & overlapping DES RESOLUTE ONYX 2.5X18   . ILIAC ARTERY STENT  10/27/2011   (Dr. Ellyn Hack Dr. Gwenlyn Found) left CIA - 32mx40mm self-expanding stent, decreasing downt to 20-30% lesion, 100% occluded R CIA with extensive lumbar collateralization to R internal iliac;  05/2016: Ao-Ileac & LEA Dopplers: RABI 0.8, LABI 1.2. B TBI 0.77 -- he has known B disease s/p L CIA stent. RLABI decreased and LABI is normal; aortoiliac atherosclerosis w/ CTO of R CIA but  normal REIA.  .Marland KitchenLEFT HEART CATH AND CORONARY ANGIOGRAPHY  06/09/2011   90% mid LAD with diffuse proximal stenosis, 100% proximal RCA --> s/p CABG x 2 (LIMA-LAD, SVG-dRCA);  .Marland KitchenLEFT HEART CATH AND CORS/GRAFTS ANGIOGRAPHY N/A 11/24/2016   Procedure: LEFT HEART CATH AND CORS/GRAFTS ANGIOGRAPHY;  Surgeon: DLeonie Man MD;  Location: [facility] INVASIVE CV LAB: 100% SVG-RCA, but now native RCA only sub-CTO with antegrade flow.  85% mLAD after D1. 40% oOM1 --> PCI of Native RCA  . NM MYOVIEW LTD  09/2016    EF 62%. Medium sized, moderate severity defect in the basal inferior, mid inferoseptal and mid inferior walls is partially reversible. - INTERMEDIATE RISK -- CATH  . PERCUTANEOUS STENT INTERVENTION Left 10/27/2011   Procedure: PERCUTANEOUS STENT INTERVENTION;  Surgeon: JLorretta Harp MD / DLeonie Man MD;  [facility];  Service: Cardiovascular;  Laterality: Left;  . TONSILLECTOMY  ~ 1963  . TRANSTHORACIC ECHOCARDIOGRAM  08/19/2011   EF=>55%; mild mitral annular calcif; mild TR   Family History  Problem Relation Age of Onset  . Heart disease Mother        valve replacement  . Diabetes Mother   . Heart failure Mother   . CAD Sister        cardiac stent  . Stroke Sister        stents in neck  . Pneumonia Father    Social History   Socioeconomic History  . Marital status: Divorced    Spouse name: Not on file  . Number of children: 3  . Years of education: Not on file  . Highest education level: Not on file  Occupational History  . Not on file  Tobacco Use  . Smoking status: Former Smoker    Packs/day: 2.00    Years: 51.50    Pack years: 103.00    Types: Cigarettes    Quit date: 06/08/2011    Years since quitting: 8.6  . Smokeless tobacco: Former USystems developer   Types: CBellevuedate: 07/13/2003  Substance and Sexual Activity  . Alcohol use: No    Comment: 11/24/2016 "nothing in 2 1/2 years; never an alcoholic"  . Drug use: No  . Sexual activity: Never  Other Topics  Concern  . Not on file  Social History Narrative  . Not on file   Social Determinants of Health   Financial Resource Strain:   . Difficulty of Paying Living Expenses:   Food Insecurity:   . Worried About RCharity fundraiserin the Last Year:   . RArboriculturistin the Last Year:   Transportation Needs:   . LFilm/video editor(Medical):   .Marland KitchenLack of Transportation (Non-Medical):   Physical Activity:   . Days  of Exercise per Week:   . Minutes of Exercise per Session:   Stress:   . Feeling of Stress :   Social Connections:   . Frequency of Communication with Friends and Family:   . Frequency of Social Gatherings with Friends and Family:   . Attends Religious Services:   . Active Member of Clubs or Organizations:   . Attends Archivist Meetings:   Marland Kitchen Marital Status:     Outpatient Encounter Medications as of 01/15/2020  Medication Sig  . atorvastatin (LIPITOR) 20 MG tablet TAKE 1 TABLET BY MOUTH ALTERNATING WITH TWO TABLETS EVERY OTHER DAY AT BEDTIME  . clopidogrel (PLAVIX) 75 MG tablet Take 1 tablet (75 mg total) by mouth daily.  . fenofibrate (TRICOR) 48 MG tablet Take 1 tablet (48 mg total) by mouth daily.  Marland Kitchen levothyroxine (SYNTHROID) 75 MCG tablet TAKE 1 TABLET BY MOUTH DAILY  . metoprolol tartrate (LOPRESSOR) 25 MG tablet Take 1 tablet (25 mg total) by mouth 2 (two) times daily.  . nitroGLYCERIN (NITROSTAT) 0.4 MG SL tablet Place 1 tablet (0.4 mg total) under the tongue every 5 (five) minutes as needed. After 3 doses, if no relief dial 911  . Vitamin D, Ergocalciferol, (DRISDOL) 1.25 MG (50000 UNIT) CAPS capsule TAKE 1 CAPSULE BY MOUTH EVERY 7 DAYS   No facility-administered encounter medications on file as of 01/15/2020.    Activities of Daily Living In your present state of health, do you have any difficulty performing the following activities: 01/15/2020  Hearing? Y  Vision? N  Difficulty concentrating or making decisions? Y  Walking or climbing stairs? Y   Comment COPD  Dressing or bathing? N  Doing errands, shopping? N  Some recent data might be hidden    Patient Care Team: Lorrene Reid, PA-C as PCP - General Leonie Man, MD as PCP - Cardiology (Cardiology) Leonie Man, MD as Consulting Physician (Cardiology)   Assessment:   This is a routine wellness examination for Michael [name].  Exercise Activities and Dietary recommendations - Pt has COPD and breathing limits his activity level so encourage to stay as active as possible.  - Follow heart healthy diet   Goals   None     Fall Risk Fall Risk  01/15/2020 01/11/2019 05/24/2018 10/22/2017 05/24/2017  Falls in the past year? 0 0 No No Yes  Number falls in past yr: - - - - 1  Injury with Fall? - - - - No  Risk for fall due to : - Impaired balance/gait;Impaired mobility - - -  Follow up Falls evaluation completed Falls evaluation completed - - -   Is the patient's home free of loose throw rugs in walkways, pet beds, electrical cords, etc?   yes      Grab bars in the bathroom? yes      Handrails on the stairs?   No stairs      Adequate lighting?   yes  Timed Get Up and Go Performed: unable to perform  Depression Screen PHQ 2/9 Scores 01/15/2020 07/10/2019 03/09/2019 01/11/2019  PHQ - 2 Score 3 0 - 0  PHQ- 9 Score 12 2 - 0  Exception Documentation - - Patient refusal -    Cognitive Function: WNL     6CIT Screen 01/15/2020 01/11/2019 10/22/2017  What Year? 0 points 0 points 0 points  What month? 0 points 0 points 0 points  What time? 0 points 0 points 0 points  Count back from 20 0 points 0 points  0 points  Months in reverse 0 points 0 points 0 points  Repeat phrase 8 points 10 points 10 points  Total Score _0 There is no immunization history on file for this patient.  Qualifies for Shingles Vaccine? Pt declined.  Screening Tests Health Maintenance  Topic Date Due  . COVID-19 Vaccine (1) Never done  . TETANUS/TDAP  Never done  . PNA vac Low Risk Adult (1  of 2 - PCV13) Never done  . INFLUENZA VACCINE  03/24/2020  . Fecal DNA (Cologuard)  10/31/2020  . Hepatitis C Screening  Completed   Cancer Screenings: Lung: Low Dose CT Chest recommended if Age 22-80 years, 30 pack-year currently smoking OR have quit w/in 15years. Patient does qualify. Colorectal: aged out  Additional Screenings: Hepatitis C Screening: UTD   Plan:   Continue current medication regimen.  Follow heart healthy diet and stay as active as possible.  Stay well hydrated, at least 64 fl oz.  Follow-up in 4 months for regular OV, HTN, Pre-DM, hypothyroid, HLD  I have personally reviewed and noted the following in the patient's chart:   . Medical and social history . Use of alcohol, tobacco or illicit drugs  . Current medications and supplements . Functional ability and status . Nutritional status . Physical activity . Advanced directives . List of other physicians . Hospitalizations, surgeries, and ER visits in previous 12 months . Vitals . Screenings to include cognitive, depression, and falls . Referrals and appointments  In addition, I have reviewed and discussed with patient certain preventive protocols, quality metrics, and best practice recommendations. A written personalized care plan for preventive services as well as general preventive health recommendations were provided to patient.       01/15/2020

## 2020-01-16 LAB — CBC
Hematocrit: 43.3 % (ref 37.5–51.0)
Hemoglobin: 13.9 g/dL (ref 13.0–17.7)
MCH: 30.9 pg (ref 26.6–33.0)
MCHC: 32.1 g/dL (ref 31.5–35.7)
MCV: 96 fL (ref 79–97)
Platelets: 270 10*3/uL (ref 150–450)
RBC: 4.5 x10E6/uL (ref 4.14–5.80)
RDW: 13.1 % (ref 11.6–15.4)
WBC: 5.5 10*3/uL (ref 3.4–10.8)

## 2020-01-16 LAB — COMPREHENSIVE METABOLIC PANEL
ALT: 42 IU/L (ref 0–44)
AST: 31 IU/L (ref 0–40)
Albumin/Globulin Ratio: 1.4 (ref 1.2–2.2)
Albumin: 3.9 g/dL (ref 3.7–4.7)
Alkaline Phosphatase: 57 IU/L (ref 48–121)
BUN/Creatinine Ratio: 12 (ref 10–24)
BUN: 18 mg/dL (ref 8–27)
Bilirubin Total: 0.4 mg/dL (ref 0.0–1.2)
CO2: 24 mmol/L (ref 20–29)
Calcium: 9.6 mg/dL (ref 8.6–10.2)
Chloride: 108 mmol/L — ABNORMAL HIGH (ref 96–106)
Creatinine, Ser: 1.45 mg/dL — ABNORMAL HIGH (ref 0.76–1.27)
GFR calc Af Amer: 54 mL/min/{1.73_m2} — ABNORMAL LOW (ref >59)
GFR calc non Af Amer: 47 mL/min/{1.73_m2} — ABNORMAL LOW (ref >59)
Globulin, Total: 2.7 g/dL (ref 1.5–4.5)
Glucose: 95 mg/dL (ref 65–99)
Potassium: 5.3 mmol/L — ABNORMAL HIGH (ref 3.5–5.2)
Sodium: 143 mmol/L (ref 134–144)
Total Protein: 6.6 g/dL (ref 6.0–8.5)

## 2020-01-16 LAB — HEMOGLOBIN A1C
Est. average glucose Bld gHb Est-mCnc: 120 mg/dL
Hgb A1c MFr Bld: 5.8 % — ABNORMAL HIGH (ref 4.8–5.6)

## 2020-01-16 LAB — LIPID PANEL
Chol/HDL Ratio: 3.8 ratio (ref 0.0–5.0)
Cholesterol, Total: 136 mg/dL (ref 100–199)
HDL: 36 mg/dL — ABNORMAL LOW (ref >39)
LDL Chol Calc (NIH): 80 mg/dL (ref 0–99)
Triglycerides: 107 mg/dL (ref 0–149)
VLDL Cholesterol Cal: 20 mg/dL (ref 5–40)

## 2020-01-16 LAB — TSH: TSH: 1.34 u[IU]/mL (ref 0.450–4.500)

## 2020-01-19 ENCOUNTER — Other Ambulatory Visit: Payer: Self-pay | Admitting: Physician Assistant

## 2020-01-19 DIAGNOSIS — E875 Hyperkalemia: Secondary | ICD-10-CM

## 2020-01-29 ENCOUNTER — Other Ambulatory Visit: Payer: Self-pay | Admitting: Physician Assistant

## 2020-01-29 DIAGNOSIS — I10 Essential (primary) hypertension: Secondary | ICD-10-CM

## 2020-01-29 DIAGNOSIS — E039 Hypothyroidism, unspecified: Secondary | ICD-10-CM

## 2020-01-29 DIAGNOSIS — R7303 Prediabetes: Secondary | ICD-10-CM

## 2020-01-29 DIAGNOSIS — E785 Hyperlipidemia, unspecified: Secondary | ICD-10-CM

## 2020-01-30 ENCOUNTER — Other Ambulatory Visit: Payer: Medicare HMO

## 2020-01-30 ENCOUNTER — Other Ambulatory Visit: Payer: Self-pay

## 2020-01-30 DIAGNOSIS — E039 Hypothyroidism, unspecified: Secondary | ICD-10-CM | POA: Diagnosis not present

## 2020-01-30 DIAGNOSIS — E785 Hyperlipidemia, unspecified: Secondary | ICD-10-CM

## 2020-01-30 DIAGNOSIS — E875 Hyperkalemia: Secondary | ICD-10-CM

## 2020-01-30 DIAGNOSIS — E559 Vitamin D deficiency, unspecified: Secondary | ICD-10-CM | POA: Diagnosis not present

## 2020-01-30 DIAGNOSIS — R7303 Prediabetes: Secondary | ICD-10-CM | POA: Diagnosis not present

## 2020-01-30 DIAGNOSIS — I10 Essential (primary) hypertension: Secondary | ICD-10-CM

## 2020-01-31 LAB — HEMOGLOBIN A1C
Est. average glucose Bld gHb Est-mCnc: 117 mg/dL
Hgb A1c MFr Bld: 5.7 % — ABNORMAL HIGH (ref 4.8–5.6)

## 2020-01-31 LAB — COMPREHENSIVE METABOLIC PANEL
ALT: 39 IU/L (ref 0–44)
AST: 32 IU/L (ref 0–40)
Albumin/Globulin Ratio: 1.5 (ref 1.2–2.2)
Albumin: 3.8 g/dL (ref 3.7–4.7)
Alkaline Phosphatase: 60 IU/L (ref 48–121)
BUN/Creatinine Ratio: 12 (ref 10–24)
BUN: 16 mg/dL (ref 8–27)
Bilirubin Total: 0.3 mg/dL (ref 0.0–1.2)
CO2: 21 mmol/L (ref 20–29)
Calcium: 8.9 mg/dL (ref 8.6–10.2)
Chloride: 108 mmol/L — ABNORMAL HIGH (ref 96–106)
Creatinine, Ser: 1.36 mg/dL — ABNORMAL HIGH (ref 0.76–1.27)
GFR calc Af Amer: 58 mL/min/{1.73_m2} — ABNORMAL LOW (ref >59)
GFR calc non Af Amer: 51 mL/min/{1.73_m2} — ABNORMAL LOW (ref >59)
Globulin, Total: 2.6 g/dL (ref 1.5–4.5)
Glucose: 90 mg/dL (ref 65–99)
Potassium: 4.5 mmol/L (ref 3.5–5.2)
Sodium: 139 mmol/L (ref 134–144)
Total Protein: 6.4 g/dL (ref 6.0–8.5)

## 2020-01-31 LAB — CBC
Hematocrit: 40.6 % (ref 37.5–51.0)
Hemoglobin: 13.3 g/dL (ref 13.0–17.7)
MCH: 31.5 pg (ref 26.6–33.0)
MCHC: 32.8 g/dL (ref 31.5–35.7)
MCV: 96 fL (ref 79–97)
Platelets: 259 10*3/uL (ref 150–450)
RBC: 4.22 x10E6/uL (ref 4.14–5.80)
RDW: 13.3 % (ref 11.6–15.4)
WBC: 5.4 10*3/uL (ref 3.4–10.8)

## 2020-01-31 LAB — LIPID PANEL
Chol/HDL Ratio: 3.9 ratio (ref 0.0–5.0)
Cholesterol, Total: 132 mg/dL (ref 100–199)
HDL: 34 mg/dL — ABNORMAL LOW (ref >39)
LDL Chol Calc (NIH): 79 mg/dL (ref 0–99)
Triglycerides: 103 mg/dL (ref 0–149)
VLDL Cholesterol Cal: 19 mg/dL (ref 5–40)

## 2020-01-31 LAB — TSH: TSH: 1.06 u[IU]/mL (ref 0.450–4.500)

## 2020-02-07 LAB — SPECIMEN STATUS REPORT

## 2020-02-07 LAB — VITAMIN D 25 HYDROXY (VIT D DEFICIENCY, FRACTURES): Vit D, 25-Hydroxy: 41.8 ng/mL (ref 30.0–100.0)

## 2020-02-14 DIAGNOSIS — I2510 Atherosclerotic heart disease of native coronary artery without angina pectoris: Secondary | ICD-10-CM | POA: Diagnosis not present

## 2020-02-14 DIAGNOSIS — J42 Unspecified chronic bronchitis: Secondary | ICD-10-CM | POA: Diagnosis not present

## 2020-02-14 DIAGNOSIS — J449 Chronic obstructive pulmonary disease, unspecified: Secondary | ICD-10-CM | POA: Diagnosis not present

## 2020-02-19 ENCOUNTER — Other Ambulatory Visit: Payer: Self-pay | Admitting: Physician Assistant

## 2020-02-19 DIAGNOSIS — E039 Hypothyroidism, unspecified: Secondary | ICD-10-CM

## 2020-02-19 MED ORDER — LEVOTHYROXINE SODIUM 75 MCG PO TABS: 75.0000 ug | ORAL_TABLET | Freq: Every day | ORAL | 0 refills | Status: DC

## 2020-04-12 ENCOUNTER — Telehealth: Payer: Self-pay | Admitting: Cardiology

## 2020-04-12 NOTE — Telephone Encounter (Signed)
Patient is calling to see about the [place] he has had done annually, for approximately the last 9 years. When he called 7/23 to schedule his appointment with Dr. Herbie Baltimore, he was advised that someone would call to schedule the [place] but no one has yet. I made the patient aware that at this time there was no order for an [place]. Please advise.

## 2020-04-12 NOTE — Telephone Encounter (Signed)
PLease schedule pt's ultrasound needs to be done before appt with Dr Herbie Baltimore on 06-14-20  Pt unavailable between 11-12 ./cy

## 2020-05-17 ENCOUNTER — Other Ambulatory Visit: Payer: Self-pay

## 2020-05-17 ENCOUNTER — Encounter: Payer: Self-pay | Admitting: Physician Assistant

## 2020-05-17 ENCOUNTER — Ambulatory Visit (INDEPENDENT_AMBULATORY_CARE_PROVIDER_SITE_OTHER): Payer: Medicare HMO | Admitting: Physician Assistant

## 2020-05-17 VITALS — BP 128/76 | HR 74 | Ht 67.0 in | Wt 216.6 lb

## 2020-05-17 DIAGNOSIS — R7303 Prediabetes: Secondary | ICD-10-CM

## 2020-05-17 DIAGNOSIS — I25118 Atherosclerotic heart disease of native coronary artery with other forms of angina pectoris: Secondary | ICD-10-CM | POA: Diagnosis not present

## 2020-05-17 DIAGNOSIS — J9611 Chronic respiratory failure with hypoxia: Secondary | ICD-10-CM

## 2020-05-17 DIAGNOSIS — E039 Hypothyroidism, unspecified: Secondary | ICD-10-CM

## 2020-05-17 DIAGNOSIS — I10 Essential (primary) hypertension: Secondary | ICD-10-CM | POA: Diagnosis not present

## 2020-05-17 DIAGNOSIS — J439 Emphysema, unspecified: Secondary | ICD-10-CM

## 2020-05-17 NOTE — Progress Notes (Signed)
Established Patient Office Visit  Subjective:  Patient ID: Michael [name], male    DOB: 18-May-1946  Age: 74 y.o. MRN: [id number]  CC:  Chief Complaint  Patient presents with  . Hypertension  . Prediabetes  . Thyroid Problem    HPI Michael [name] presents for chronic follow up on hypertension, prediabetes, hypothyroid. Patient expresses at length why Dr. Raliegh Scarlet did not discuss with him he had CKD. He did not know until he read his AVS.  HTN: Pt denies new onset chest pain, palpitations, dizziness or lower extremity swelling. Taking medication as directed without side effects. Doesn't check BP at home.  Prediabetes: Denies increased thirst or urination. Reports he doesn't monitor anything in specific and eats what he likes/wants.  Hypothyroid: Reports hot flashes, which he has experienced for a long period of time and doesn't feel it is related to thyroid problem. Taking medication as directed.  COPD: States he has seen a pulmonologist and pretty much informed him there was nothing else he could do for him. States he is interested on portable oxygen which would be easier to handle and carry at home and when going to places. He does have a home oxygen therapy.  Past Medical History:  Diagnosis Date  . Arthritis    "right hip; right wrist" (11/24/2016)  . CAD, multiple vessel 05/2011   90% mid LAD with diffuse proximal stenosis, 100% proximal RCA --> s/p CABG x 2 (LIMA-LAD, SVG-dRCA); b) Lexiscan Myoview 09/2016 w/ Inferior-Inferolateral defect -> Cath:  11/24/16: 100% CTO of SVG-RCA (but nagtive /RCA only subtotal occlusion) --> PCI with DES x2 (resolute onyx DES 2.25 mm x 30 mm, 2.5 mm x 20 mm) RCA  . Carotid artery occlusion   . COPD (chronic obstructive pulmonary disease) with emphysema ([place])   . Essential hypertension   . History of gout   . History of tobacco abuse    quit 05/2011  . Hyperlipidemia with target LDL less than 70   . Hypothyroidism   . MI (myocardial infarction) ([place])  05/2011   "I had 3 in 2 days"  . NSTEMI (non-ST elevated myocardial infarction) ([place]) 06/05/2011   With CHF - probably late presentation for missed STEMI --> CATH- MV CAD;; Echo 07/2001: EF > 55%, mild MAC, mild TR, no WMA  with Post-op Setpal dyskinesis, [place] not seen)  . PAD (peripheral artery disease) ([place]) 06/08/2011   a) R Common Iliac 100%, L Common Iliac 80+% occlusion; b) L CIA - Aterectomy- 10 mm x 40 mm self-extending Stent (postdilated to 7 mm with 20-30% residual)--> Deferred R AoFem bypass;; c) LEA Dopplers 04/2012 & 04/2014 - RABI 0.8 & LABI 1.1 - STABLE, no change  . Pneumonia 1940s X 1  . Presence of drug coated stent in right coronary artery 11/24/2016   p-mRCA: Resolute Onyx DES 2.25 mm x 38 mm --> 2.5 mm x 18 mm (post-dilated from 2.7-2.8 mm)    Past Surgical History:  Procedure Laterality Date  . CORONARY ANGIOPLASTY WITH STENT PLACEMENT  11/24/2016   with 100% SVG-RCA & flow in native RCA now present --> PCI-p-m RCA 2 Overlapping Resolute DES (2.25 x 38, 1.5 x 18)  . CORONARY ARTERY BYPASS GRAFT  06/10/2011   oLIMA to LAD, SVG to PDA (Dr. Servando Snare)  . CORONARY STENT INTERVENTION N/A 11/24/2016   Procedure: Coronary Stent Intervention;  Surgeon: Leonie Man, MD;  Location: [facility] INVASIVE CV LAB: [place] 2.25X38 & overlapping DES RESOLUTE ONYX 2.5X18   . ILIAC  ARTERY STENT  10/27/2011   (Dr. Ellyn Hack Dr. Gwenlyn Found) left CIA - 60mx40mm self-expanding stent, decreasing downt to 20-30% lesion, 100% occluded R CIA with extensive lumbar collateralization to R internal iliac;  05/2016: Ao-Ileac & LEA Dopplers: RABI 0.8, LABI 1.2. B TBI 0.77 -- he has known B disease s/p L CIA stent. RLABI decreased and LABI is normal; aortoiliac atherosclerosis w/ CTO of R CIA but normal REIA.  .Marland KitchenLEFT HEART CATH AND CORONARY ANGIOGRAPHY  06/09/2011   90% mid LAD with diffuse proximal stenosis, 100% proximal RCA --> s/p CABG x 2 (LIMA-LAD, SVG-dRCA);  .Marland KitchenLEFT HEART CATH AND CORS/GRAFTS ANGIOGRAPHY  N/A 11/24/2016   Procedure: LEFT HEART CATH AND CORS/GRAFTS ANGIOGRAPHY;  Surgeon: DLeonie Man MD;  Location: [facility] INVASIVE CV LAB: 100% SVG-RCA, but now native RCA only sub-CTO with antegrade flow.  85% mLAD after D1. 40% oOM1 --> PCI of Native RCA  . NM MYOVIEW LTD  09/2016    EF 62%. Medium sized, moderate severity defect in the basal inferior, mid inferoseptal and mid inferior walls is partially reversible. - INTERMEDIATE RISK -- CATH  . PERCUTANEOUS STENT INTERVENTION Left 10/27/2011   Procedure: PERCUTANEOUS STENT INTERVENTION;  Surgeon: JLorretta Harp MD / DLeonie Man MD;  [facility];  Service: Cardiovascular;  Laterality: Left;  . TONSILLECTOMY  ~ 1963  . TRANSTHORACIC ECHOCARDIOGRAM  08/19/2011   EF=>55%; mild mitral annular calcif; mild TR    Family History  Problem Relation Age of Onset  . Heart disease Mother        valve replacement  . Diabetes Mother   . Heart failure Mother   . CAD Sister        cardiac stent  . Stroke Sister        stents in neck  . Pneumonia Father     Social History   Socioeconomic History  . Marital status: Divorced    Spouse name: Not on file  . Number of children: 3  . Years of education: Not on file  . Highest education level: Not on file  Occupational History  . Not on file  Tobacco Use  . Smoking status: Former Smoker    Packs/day: 2.00    Years: 51.50    Pack years: 103.00    Types: Cigarettes    Quit date: 06/08/2011    Years since quitting: 8.9  . Smokeless tobacco: Former [employer] developer   Types: Chew    Quit date: 07/13/2003  Vaping Use  . Vaping Use: Never used  Substance and Sexual Activity  . Alcohol use: No    Comment: 11/24/2016 "nothing in 2 1/2 years; never an alcoholic"  . Drug use: No  . Sexual activity: Never  Other Topics Concern  . Not on file  Social History Narrative  . Not on file   Social Determinants of Health   Financial Resource Strain:   . Difficulty of Paying Living Expenses: Not on  file  Food Insecurity:   . Worried About RCharity fundraiserin the Last Year: Not on file  . Ran Out of Food in the Last Year: Not on file  Transportation Needs:   . Lack of Transportation (Medical): Not on file  . Lack of Transportation (Non-Medical): Not on file  Physical Activity:   . Days of Exercise per Week: Not on file  . Minutes of Exercise per Session: Not on file  Stress:   . Feeling of Stress : Not on file  Social Connections:   . Frequency of Communication with Friends and Family: Not on file  . Frequency of Social Gatherings with Friends and Family: Not on file  . Attends Religious Services: Not on file  . Active Member of Clubs or Organizations: Not on file  . Attends Archivist Meetings: Not on file  . Marital Status: Not on file  Intimate Partner Violence:   . Fear of Current or Ex-Partner: Not on file  . Emotionally Abused: Not on file  . Physically Abused: Not on file  . Sexually Abused: Not on file    Outpatient Medications Prior to Visit  Medication Sig Dispense Refill  . atorvastatin (LIPITOR) 20 MG tablet TAKE 1 TABLET BY MOUTH ALTERNATING WITH TWO TABLETS EVERY OTHER DAY AT BEDTIME 180 tablet 3  . clopidogrel (PLAVIX) 75 MG tablet Take 1 tablet (75 mg total) by mouth daily. 90 tablet 3  . fenofibrate (TRICOR) 48 MG tablet Take 1 tablet (48 mg total) by mouth daily. 90 tablet 3  . metoprolol tartrate (LOPRESSOR) 25 MG tablet Take 1 tablet (25 mg total) by mouth 2 (two) times daily. 180 tablet 3  . nitroGLYCERIN (NITROSTAT) 0.4 MG SL tablet Place 1 tablet (0.4 mg total) under the tongue every 5 (five) minutes as needed. After 3 doses, if no relief dial 911 30 tablet 0  . Vitamin D, Ergocalciferol, (DRISDOL) 1.25 MG (50000 UNIT) CAPS capsule TAKE 1 CAPSULE BY MOUTH EVERY 7 DAYS 12 capsule 3  . levothyroxine (SYNTHROID) 75 MCG tablet Take 1 tablet (75 mcg total) by mouth daily. 90 tablet 0   No facility-administered medications prior to visit.     Allergies  Allergen Reactions  . Ace Inhibitors Cough    ROS Review of Systems   A fourteen system review of systems was performed and found to be positive as per HPI.  Objective:    Physical Exam General: Answers questions appropriately, in no acute distress, non-toxic appearing Neuro:  Alert and oriented,  extra-ocular muscles intact  HEENT:  Normocephalic, atraumatic, neck supple Skin:  no gross rash, warm, pink. Cardiac:  RRR, S1 S2, no murmur Respiratory: Wheezing and rhonchi noted, no crackles or rales Vascular:  Ext warm, no cyanosis apprec. Psych:  No HI/SI, judgement and insight appropriate, normal behavior   BP 128/76   Pulse 74   Ht _0  (1.702 m)   Wt 216 lb 9.6 oz (98.2 kg)   SpO2 94%   BMI 33.92 kg/m  Wt Readings from Last 3 Encounters:  05/17/20 216 lb 9.6 oz (98.2 kg)  07/10/19 222 lb 3.2 oz (100.8 kg)  06/15/19 218 lb 9.6 oz (99.2 kg)     Health Maintenance Due  Topic Date Due  . COVID-19 Vaccine (1) Never done  . TETANUS/TDAP  Never done  . PNA vac Low Risk Adult (1 of 2 - PCV13) Never done  . INFLUENZA VACCINE  Never done    There are no preventive care reminders to display for this patient.  Lab Results  Component Value Date   TSH 1.060 01/30/2020   Lab Results  Component Value Date   WBC 5.4 01/30/2020   HGB 13.3 01/30/2020   HCT 40.6 01/30/2020   MCV 96 01/30/2020   PLT 259 01/30/2020   Lab Results  Component Value Date   NA 139 01/30/2020   K 4.5 01/30/2020   CO2 21 01/30/2020   GLUCOSE 90 01/30/2020   BUN 16 01/30/2020   CREATININE 1.36 (H) 01/30/2020  BILITOT 0.3 01/30/2020   ALKPHOS 60 01/30/2020   AST 32 01/30/2020   ALT 39 01/30/2020   PROT 6.4 01/30/2020   ALBUMIN 3.8 01/30/2020   CALCIUM 8.9 01/30/2020   ANIONGAP 4 (L) 11/25/2016   Lab Results  Component Value Date   CHOL 132 01/30/2020   Lab Results  Component Value Date   HDL 34 (L) 01/30/2020   Lab Results  Component Value Date   LDLCALC 79  01/30/2020   Lab Results  Component Value Date   TRIG 103 01/30/2020   Lab Results  Component Value Date   CHOLHDL 3.9 01/30/2020   Lab Results  Component Value Date   HGBA1C 5.7 (H) 01/30/2020      Assessment & Plan:   Problem List Items Addressed This Visit      Cardiovascular and Mediastinum   CAD, multiple vessel - status post CABG: LIMA-LAD, SVG-RPDA; PCI native RCA for 100% SVG-RCA (Chronic)   Essential hypertension - Primary     Respiratory   Chronic respiratory failure with hypoxia (HCC) - SaO2 drops to 87% with ambulation (Chronic)   Chronic obstructive pulmonary disease (HCC)     Endocrine   Hypothyroidism     Other   Pre-diabetes     HTN: - BP at goal. - Continue current medication regimen.. - Follow DASH diet. - Encourage to stay as active as possible. - Last CMP stable  Hypothyroidism: -Last TSH wnl at 1.060 -Continue current medication regimen. -Will continue to monitor.  Prediabetes: -Asymptomatic -Last A1c stable -Recommend to follow a low carbohydrate and glucose diet. -Will continue to monitor.  CAD, multiple vessel: -Followed by Cardiology. -Continue current medication regimen.  COPD, Chronic respiratory failure with hypoxia: -Continue with oxygen therapy. Recommend to contact insurance and inquire about portable oxygen coverage. -Evaluated by Pulmonology- Dr. Melvyn Novas and patient declined rehab and treatment options (inhalers).  No orders of the defined types were placed in this encounter.   Follow-up: Return in about 6 months (around 11/14/2020) for HTN, Pre-DM.   Note:  This note was prepared with assistance of Dragon voice recognition software. Occasional wrong-word or sound-a-like substitutions may have occurred due to the inherent limitations of voice recognition software.   Lorrene Reid, PA-C

## 2020-05-17 NOTE — Patient Instructions (Signed)
DASH Eating Plan DASH stands for "Dietary Approaches to Stop Hypertension." The DASH eating plan is a healthy eating plan that has been shown to reduce high blood pressure (hypertension). It may also reduce your risk for type 2 diabetes, heart disease, and stroke. The DASH eating plan may also help with weight loss. What are tips for following this plan?  General guidelines  Avoid eating more than 2,300 mg (milligrams) of salt (sodium) a day. If you have hypertension, you may need to reduce your sodium intake to 1,500 mg a day.  Limit alcohol intake to no more than 1 drink a day for nonpregnant women and 2 drinks a day for men. One drink equals 12 oz of beer, 5 oz of wine, or 1 oz of hard liquor.  Work with your health care provider to maintain a healthy body weight or to lose weight. Ask what an ideal weight is for you.  Get at least 30 minutes of exercise that causes your heart to beat faster (aerobic exercise) most days of the week. Activities may include walking, swimming, or biking.  Work with your health care provider or diet and nutrition specialist (dietitian) to adjust your eating plan to your individual calorie needs. Reading food labels   Check food labels for the amount of sodium per serving. Choose foods with less than 5 percent of the Daily Value of sodium. Generally, foods with less than 300 mg of sodium per serving fit into this eating plan.  To find whole grains, look for the word "whole" as the first word in the ingredient list. Shopping  Buy products labeled as "low-sodium" or "no salt added."  Buy fresh foods. Avoid canned foods and premade or frozen meals. Cooking  Avoid adding salt when cooking. Use salt-free seasonings or herbs instead of table salt or sea salt. Check with your health care provider or pharmacist before using salt substitutes.  Do not fry foods. Cook foods using healthy methods such as baking, boiling, grilling, and broiling instead.  Cook with  heart-healthy oils, such as olive, canola, soybean, or sunflower oil. Meal planning  Eat a balanced diet that includes: ? 5 or more servings of fruits and vegetables each day. At each meal, try to fill half of your plate with fruits and vegetables. ? Up to 6-8 servings of whole grains each day. ? Less than 6 oz of lean meat, poultry, or fish each day. A 3-oz serving of meat is about the same size as a deck of cards. One egg equals 1 oz. ? 2 servings of low-fat dairy each day. ? A serving of nuts, seeds, or beans 5 times each week. ? Heart-healthy fats. Healthy fats called Omega-3 fatty acids are found in foods such as flaxseeds and coldwater fish, like sardines, salmon, and mackerel.  Limit how much you eat of the following: ? Canned or prepackaged foods. ? Food that is high in trans fat, such as fried foods. ? Food that is high in saturated fat, such as fatty meat. ? Sweets, desserts, sugary drinks, and other foods with added sugar. ? Full-fat dairy products.  Do not salt foods before eating.  Try to eat at least 2 vegetarian meals each week.  Eat more home-cooked food and less restaurant, buffet, and fast food.  When eating at a restaurant, ask that your food be prepared with less salt or no salt, if possible. What foods are recommended? The items listed may not be a complete list. Talk with your dietitian about   what dietary choices are best for you. Grains Whole-grain or whole-wheat bread. Whole-grain or whole-wheat pasta. Brown rice. Oatmeal. Quinoa. Bulgur. Whole-grain and low-sodium cereals. Pita bread. Low-fat, low-sodium crackers. Whole-wheat flour tortillas. Vegetables Fresh or frozen vegetables (raw, steamed, roasted, or grilled). Low-sodium or reduced-sodium tomato and vegetable juice. Low-sodium or reduced-sodium tomato sauce and tomato paste. Low-sodium or reduced-sodium canned vegetables. Fruits All fresh, dried, or frozen fruit. Canned fruit in natural juice (without  added sugar). Meat and other protein foods Skinless chicken or turkey. Ground chicken or turkey. Pork with fat trimmed off. Fish and seafood. Egg whites. Dried beans, peas, or lentils. Unsalted nuts, nut butters, and seeds. Unsalted canned beans. Lean cuts of beef with fat trimmed off. Low-sodium, lean deli meat. Dairy Low-fat (1%) or fat-free (skim) milk. Fat-free, low-fat, or reduced-fat cheeses. Nonfat, low-sodium ricotta or cottage cheese. Low-fat or nonfat yogurt. Low-fat, low-sodium cheese. Fats and oils Soft margarine without trans fats. Vegetable oil. Low-fat, reduced-fat, or light mayonnaise and salad dressings (reduced-sodium). Canola, safflower, olive, soybean, and sunflower oils. Avocado. Seasoning and other foods Herbs. Spices. Seasoning mixes without salt. Unsalted popcorn and pretzels. Fat-free sweets. What foods are not recommended? The items listed may not be a complete list. Talk with your dietitian about what dietary choices are best for you. Grains Baked goods made with fat, such as croissants, muffins, or some breads. Dry pasta or rice meal packs. Vegetables Creamed or fried vegetables. Vegetables in a cheese sauce. Regular canned vegetables (not low-sodium or reduced-sodium). Regular canned tomato sauce and paste (not low-sodium or reduced-sodium). Regular tomato and vegetable juice (not low-sodium or reduced-sodium). Pickles. Olives. Fruits Canned fruit in a light or heavy syrup. Fried fruit. Fruit in cream or butter sauce. Meat and other protein foods Fatty cuts of meat. Ribs. Fried meat. Bacon. Sausage. Bologna and other processed lunch meats. Salami. Fatback. Hotdogs. Bratwurst. Salted nuts and seeds. Canned beans with added salt. Canned or smoked fish. Whole eggs or egg yolks. Chicken or turkey with skin. Dairy Whole or 2% milk, cream, and half-and-half. Whole or full-fat cream cheese. Whole-fat or sweetened yogurt. Full-fat cheese. Nondairy creamers. Whipped toppings.  Processed cheese and cheese spreads. Fats and oils Butter. Stick margarine. Lard. Shortening. Ghee. Bacon fat. Tropical oils, such as coconut, palm kernel, or palm oil. Seasoning and other foods Salted popcorn and pretzels. Onion salt, garlic salt, seasoned salt, table salt, and sea salt. Worcestershire sauce. Tartar sauce. Barbecue sauce. Teriyaki sauce. Soy sauce, including reduced-sodium. Steak sauce. Canned and packaged gravies. Fish sauce. Oyster sauce. Cocktail sauce. Horseradish that you find on the shelf. Ketchup. Mustard. Meat flavorings and tenderizers. Bouillon cubes. Hot sauce and Tabasco sauce. Premade or packaged marinades. Premade or packaged taco seasonings. Relishes. Regular salad dressings. Where to find more information:  National Heart, Lung, and Blood Institute: www.nhlbi.nih.gov  American Heart Association: www.heart.org Summary  The DASH eating plan is a healthy eating plan that has been shown to reduce high blood pressure (hypertension). It may also reduce your risk for type 2 diabetes, heart disease, and stroke.  With the DASH eating plan, you should limit salt (sodium) intake to 2,300 mg a day. If you have hypertension, you may need to reduce your sodium intake to 1,500 mg a day.  When on the DASH eating plan, aim to eat more fresh fruits and vegetables, whole grains, lean proteins, low-fat dairy, and heart-healthy fats.  Work with your health care provider or diet and nutrition specialist (dietitian) to adjust your eating plan to your   individual calorie needs. This information is not intended to replace advice given to you by your health care provider. Make sure you discuss any questions you have with your health care provider. Document Revised: 07/23/2017 Document Reviewed: 08/03/2016 Elsevier Patient Education  2020 [employer]  

## 2020-05-22 ENCOUNTER — Other Ambulatory Visit: Payer: Self-pay | Admitting: Physician Assistant

## 2020-05-22 DIAGNOSIS — E039 Hypothyroidism, unspecified: Secondary | ICD-10-CM

## 2020-06-06 ENCOUNTER — Ambulatory Visit (HOSPITAL_COMMUNITY)
Admission: RE | Admit: 2020-06-06 | Discharge: 2020-06-06 | Disposition: A | Discharge status: Home / Self Care | Payer: Medicare HMO | Source: Ambulatory Visit | Attending: Internal Medicine | Admitting: Internal Medicine

## 2020-06-06 ENCOUNTER — Other Ambulatory Visit (HOSPITAL_COMMUNITY): Payer: Self-pay | Admitting: Cardiology

## 2020-06-06 ENCOUNTER — Other Ambulatory Visit: Payer: Self-pay

## 2020-06-06 ENCOUNTER — Ambulatory Visit (HOSPITAL_BASED_OUTPATIENT_CLINIC_OR_DEPARTMENT_OTHER)
Admission: RE | Admit: 2020-06-06 | Discharge: 2020-06-06 | Disposition: A | Discharge status: Home / Self Care | Payer: Medicare HMO | Source: Ambulatory Visit | Attending: Internal Medicine | Admitting: Internal Medicine

## 2020-06-06 DIAGNOSIS — I739 Peripheral vascular disease, unspecified: Secondary | ICD-10-CM

## 2020-06-06 DIAGNOSIS — Z95828 Presence of other vascular implants and grafts: Secondary | ICD-10-CM | POA: Insufficient documentation

## 2020-06-14 ENCOUNTER — Other Ambulatory Visit: Payer: Self-pay

## 2020-06-14 ENCOUNTER — Ambulatory Visit: Payer: Medicare HMO | Admitting: Cardiology

## 2020-06-14 ENCOUNTER — Encounter: Payer: Self-pay | Admitting: Cardiology

## 2020-06-14 VITALS — BP 130/86 | HR 61 | Ht 67.0 in | Wt 216.2 lb

## 2020-06-14 DIAGNOSIS — Z95828 Presence of other vascular implants and grafts: Secondary | ICD-10-CM | POA: Diagnosis not present

## 2020-06-14 DIAGNOSIS — J9611 Chronic respiratory failure with hypoxia: Secondary | ICD-10-CM

## 2020-06-14 DIAGNOSIS — I25118 Atherosclerotic heart disease of native coronary artery with other forms of angina pectoris: Secondary | ICD-10-CM

## 2020-06-14 DIAGNOSIS — I779 Disorder of arteries and arterioles, unspecified: Secondary | ICD-10-CM

## 2020-06-14 DIAGNOSIS — E785 Hyperlipidemia, unspecified: Secondary | ICD-10-CM | POA: Diagnosis not present

## 2020-06-14 DIAGNOSIS — I259 Chronic ischemic heart disease, unspecified: Secondary | ICD-10-CM

## 2020-06-14 DIAGNOSIS — Z955 Presence of coronary angioplasty implant and graft: Secondary | ICD-10-CM

## 2020-06-14 DIAGNOSIS — J449 Chronic obstructive pulmonary disease, unspecified: Secondary | ICD-10-CM | POA: Diagnosis not present

## 2020-06-14 DIAGNOSIS — I10 Essential (primary) hypertension: Secondary | ICD-10-CM

## 2020-06-14 DIAGNOSIS — I214 Non-ST elevation (NSTEMI) myocardial infarction: Secondary | ICD-10-CM

## 2020-06-14 DIAGNOSIS — N1831 Chronic kidney disease, stage 3a: Secondary | ICD-10-CM

## 2020-06-14 NOTE — Patient Instructions (Addendum)

## 2020-06-14 NOTE — Progress Notes (Signed)
Primary Care Provider: Lorrene Reid, PA-C Cardiologist: Glenetta Hew, MD Electrophysiologist: None  Clinic Note: Chief Complaint  Patient presents with  . Follow-up    1 year.  No major changes  . Coronary Artery Disease    No angina only short of breath from COPD  . PAD    Not walking enough to notice claudication    HPI:    Michael [name] is a 74 y.o. 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   Problem List Items Addressed This Visit    CAD, multiple vessel - status post CABG: LIMA-LAD, SVG-RPDA; PCI native RCA for 100% SVG-RCA - Primary (Chronic)/ Presence of drug coated stent in right coronary artery (Chronic) / h/o NSTEMI   Cath 11/2016 -> p-mLAD 65% after D1 --> 100%. Patent LIMA-LAD. Co-dom LCx minimal Dz with 40% OM1; SVG-rPDA 100%, but previously occluded native RCA recanalized --> 70%-99%-65% (prox-mid) -> 2 Overlapping Resolute Onyx DES 2.25 x 38, 2.5 x 18.   Chronic respiratory failure with hypoxia (HCC) - SaO2 drops to 87% with ambulation (Chronic)   - Home O2 - but does not have portable tank; does not think Pulm Med Rx is helpful. -- Essentially sedentary.   Hyperlipidemia with target LDL less than 70 (Chronic)   Peripheral arterial occlusive disease (HCC) - Status post insertion of iliac artery stent       L Iliac A Stent & 100% CTO R Iliac - fills via lumbar collateral &L-R Internal Iliac collaterals.   Essential hypertension     Michael [name] was last seen on June 17, 2019 for annual follow-up.  He apparently has been seen by Dr. Melvyn Novas from pulmonary medicine and felt that this was an entirely useless visit.  He felt like he had not had any benefit from inhalers in the past and did not really try them.  What he wanted was home  oxygen portable tank, but this was not provided.  He decided he would not go back to pulmonary medicine.  He was doing fine from a cardiac standpoint mostly limited by his breathing.  He is pretty much sedentary staying at home does not go out.  Is short of breath simply walking from his garage to get his lawnmower out.  Does not really walk enough to get claudication.  Only does low intensity yard work.  Gets short of breath simply speaking.  No changes  Recent Hospitalizations: None  Reviewed  CV studies:    The following studies were reviewed today: (if available, images/films reviewed: From Epic Chart or Care Everywhere) . Abdominal aortic aortoiliac Dopplers 06/06/2020: Stable.  Known right common iliac occlusion with patent left iliac stent (mildly elevated velocity greater than 50% stenosis).  R ABI 0.96, L ABI 1.21.  R TBI 0.66, L TBI 0.63 (relatively stable)   Interval History:   Michael [name] is here today with the same story as he has had for the last 3 years of seeing him.  He pretty much gets short of breath walking around the house.  Walking to the exam room today was exhausting for him.  He really does not do well in the high heat/immunity or cold.  He just does not go outside at that time.  He says he gets worse each year,  but is slow.  He wears oxygen when he is at home, usually at rest, but does not take it out with him because he does not have portable tanks.  He looked into getting the concentrators, but says that they probably will help that much because his only pulses of oxygen.  He still does yard work and is not that hot with high humidity or too cold.  He still likes to do use his riding mower.  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  He is adamant that he does not want to take any pulmonary medicine inhalers.  He does not want to see a pulmonologist.  For similar reasons he does not want to get Covid shots because  he does not go out around people.  He does not want take the risk when he is not really himself at risk to get Covid.  While he has his baseline dyspnea that is pretty significant, he denies any other anginal symptoms.  No PND orthopnea with trivial edema.  CV Review of Symptoms (Summary): positive for - dyspnea on exertion, edema, shortness of breath and Pedal neuropathy negative for - chest pain, irregular heartbeat, orthopnea, palpitations, paroxysmal nocturnal dyspnea, rapid heart rate or Syncope/near syncope or TIA/amaurosis fugax; does not walk enough for claudication  The patient does not have symptoms concerning for COVID-19 infection (fever, chills, cough, or new shortness of breath).   REVIEWED OF SYSTEMS   Review of Systems  Constitutional: Positive for malaise/fatigue (Started to get more sedentary.  Does not do things because he does get so short of breath.) and weight loss (Palpitations he is just not eating that much).  HENT: Positive for congestion (Sometimes in the morning). Negative for nosebleeds.   Respiratory: Positive for cough (Morning cough), shortness of breath (At baseline.  Worse with exertion.  On home oxygen) and wheezing (Every now and then uses rescue inhaler). Negative for sputum production.   Gastrointestinal: Negative for abdominal pain, blood in stool and melena.  Genitourinary: Negative for hematuria.  Musculoskeletal: Positive for joint pain.  Neurological: Positive for dizziness (If he stands up too quick), tingling (Pedal neuropathy) and weakness (Legs do feel weak).  Psychiatric/Behavioral: Positive for depression (Although he does not express this, he clearly has symptoms of depression) and memory loss. The patient is not nervous/anxious and does not have insomnia.    I have reviewed and (if needed) personally updated the patient's problem list, medications, allergies, past medical and surgical history, social and family history.   PAST MEDICAL HISTORY    Past Medical History:  Diagnosis Date  . Arthritis    "right hip; right wrist" (11/24/2016)  . CAD, multiple vessel 05/2011   90% mid LAD with diffuse proximal stenosis, 100% proximal RCA --> s/p CABG x 2 (LIMA-LAD, SVG-dRCA); b) Lexiscan Myoview 09/2016 w/ Inferior-Inferolateral defect -> Cath:  11/24/16: 100% CTO of SVG-RCA (but nagtive /RCA only subtotal occlusion) --> PCI with DES x2 (resolute onyx DES 2.25 mm x 30 mm, 2.5 mm x 20 mm) RCA  . Carotid artery occlusion   . COPD (chronic obstructive pulmonary disease) with emphysema ([place])   . Essential hypertension   . History of gout   . History of tobacco abuse    quit 05/2011  . Hyperlipidemia with target LDL less than 70   . Hypothyroidism   . MI (myocardial infarction) ([place]) 05/2011   "I had 3 in 2 days"  . NSTEMI (non-ST  elevated myocardial infarction) ([place]) 06/05/2011   With CHF - probably late presentation for missed STEMI --> CATH- MV CAD;; Echo 07/2001: EF > 55%, mild MAC, mild TR, no WMA  with Post-op Setpal dyskinesis, [place] not seen)  . PAD (peripheral artery disease) ([place]) 06/08/2011   a) R Common Iliac 100%, L Common Iliac 80+% occlusion; b) L CIA - Aterectomy- 10 mm x 40 mm self-extending Stent (postdilated to 7 mm with 20-30% residual)--> Deferred R AoFem bypass;; c) LEA Dopplers 04/2012 & 04/2014 - RABI 0.8 & LABI 1.1 - STABLE, no change  . Pneumonia 1940s X 1  . Presence of drug coated stent in right coronary artery 11/24/2016   p-mRCA: Resolute Onyx DES 2.25 mm x 38 mm --> 2.5 mm x 18 mm (post-dilated from 2.7-2.8 mm)    PAST SURGICAL HISTORY   Past Surgical History:  Procedure Laterality Date  . CORONARY ANGIOPLASTY WITH STENT PLACEMENT  11/24/2016   with 100% SVG-RCA & flow in native RCA now present --> PCI-p-m RCA 2 Overlapping Resolute DES (2.25 x 38, 1.5 x 18)  . CORONARY ARTERY BYPASS GRAFT  06/10/2011   oLIMA to LAD, SVG to PDA (Dr. Servando Snare)  . CORONARY STENT INTERVENTION N/A 11/24/2016   Procedure: Coronary  Stent Intervention;  Surgeon: Leonie Man, MD;  Location: [facility] INVASIVE CV LAB: [place] 2.25X38 & overlapping DES RESOLUTE ONYX 2.5X18   . ILIAC ARTERY STENT  10/27/2011   (Dr. Ellyn Hack Dr. Gwenlyn Found) left CIA - 73mx40mm self-expanding stent, decreasing downt to 20-30% lesion, 100% occluded R CIA with extensive lumbar collateralization to R internal iliac;  05/2016: Ao-Ileac & LEA Dopplers: RABI 0.8, LABI 1.2. B TBI 0.77 -- he has known B disease s/p L CIA stent. RLABI decreased and LABI is normal; aortoiliac atherosclerosis w/ CTO of R CIA but normal REIA.  .Marland KitchenLEFT HEART CATH AND CORONARY ANGIOGRAPHY  06/09/2011   90% mid LAD with diffuse proximal stenosis, 100% proximal RCA --> s/p CABG x 2 (LIMA-LAD, SVG-dRCA);  .Marland KitchenLEFT HEART CATH AND CORS/GRAFTS ANGIOGRAPHY N/A 11/24/2016   Procedure: LEFT HEART CATH AND CORS/GRAFTS ANGIOGRAPHY;  Surgeon: DLeonie Man MD;  Location: [facility] INVASIVE CV LAB: 100% SVG-RCA, but now native RCA only sub-CTO with antegrade flow.  85% mLAD after D1. 40% oOM1 --> PCI of Native RCA  . NM MYOVIEW LTD  09/2016    EF 62%. Medium sized, moderate severity defect in the basal inferior, mid inferoseptal and mid inferior walls is partially reversible. - INTERMEDIATE RISK -- CATH  . PERCUTANEOUS STENT INTERVENTION Left 10/27/2011   Procedure: PERCUTANEOUS STENT INTERVENTION;  Surgeon: JLorretta Harp MD / DLeonie Man MD;  [facility];  Service: Cardiovascular;  Laterality: Left;  . TONSILLECTOMY  ~ 1963  . TRANSTHORACIC ECHOCARDIOGRAM  08/19/2011   EF=>55%; mild mitral annular calcif; mild TR   CATH-PCI 11/2016 - 2 Overlapping DES to previously CTO RCA    There is no immunization history on file for this patient.  -> He does not want to get the Covid vaccine, because he does not go out around people.  He goes to the grocery store early in the morning on Sundays and that is it.  MEDICATIONS/ALLERGIES   Current Meds  Medication Sig  . atorvastatin  (LIPITOR) 20 MG tablet TAKE 1 TABLET BY MOUTH ALTERNATING WITH TWO TABLETS EVERY OTHER DAY AT BEDTIME  . clopidogrel (PLAVIX) 75 MG tablet Take 1 tablet (75 mg total) by mouth daily.  .Marland Kitchen  fenofibrate (TRICOR) 48 MG tablet Take 1 tablet (48 mg total) by mouth daily.  Marland Kitchen levothyroxine (SYNTHROID) 75 MCG tablet TAKE 1 TABLET BY MOUTH DAILY  . metoprolol tartrate (LOPRESSOR) 25 MG tablet Take 1 tablet (25 mg total) by mouth 2 (two) times daily.  . nitroGLYCERIN (NITROSTAT) 0.4 MG SL tablet Place 1 tablet (0.4 mg total) under the tongue every 5 (five) minutes as needed. After 3 doses, if no relief dial 911  . Vitamin D, Ergocalciferol, (DRISDOL) 1.25 MG (50000 UNIT) CAPS capsule TAKE 1 CAPSULE BY MOUTH EVERY 7 DAYS    Allergies  Allergen Reactions  . Ace Inhibitors Cough    SOCIAL HISTORY/FAMILY HISTORY   Reviewed in Epic:  Pertinent findings:   He says that he pretty much has been for himself and lives off of microwave meals, sandwiches and predone meals from the grocery store.  His daughter used to help by coming in to make his meals for him during the course of the week, but now she barely comes around.  They are almost now estranged.  He therefore has to fend for himself and pretty much lives a solitary life only going out to the grocery store and for other necessities.   He goes early in the morning on Saturdays and Sundays when no one else is there, but he gets his groceries done using the car to lean on, and occasionally use a scooter.    He may take his oxygen tank with him in the cart, which then allows him to walk further.  OBJCTIVE -PE, EKG, labs   Wt Readings from Last 3 Encounters:  06/14/20 216 lb 3.2 oz (98.1 kg)  05/17/20 216 lb 9.6 oz (98.2 kg)  07/10/19 222 lb 3.2 oz (100.8 kg)    Physical Exam: BP 130/86   Pulse 61   Ht _0  (1.702 m)   Wt 216 lb 3.2 oz (98.1 kg)   BMI 33.86 kg/m  Physical Exam Vitals reviewed.  Constitutional:      General: He is not in acute  distress.    Appearance: He is obese. He is ill-appearing (Chronically ill-appearing.  Based on accessory muscle use for breathing.). He is not toxic-appearing.     Comments: He has truncal obesity, but almost temporal wasting.  Neck:     Vascular: Carotid bruit (Bilateral soft bruits) present. No hepatojugular reflux or JVD.  Cardiovascular:     Rate and Rhythm: Normal rate and regular rhythm. Occasional extrasystoles are present.    Chest Wall: PMI is not displaced (Difficult to palpate).     Pulses: Decreased pulses (Faint-1+ right pedal, stronger 1+ left pedal).     Heart sounds: S1 normal. Heart sounds are distant. No murmur heard.  No friction rub. Gallop present. S4 (Cannot exclude soft S4) sounds present.   Pulmonary:     Effort: No respiratory distress (Patient accessory muscle use, but not in distress.).     Breath sounds: Wheezing (Prolonged expiratory phase with faint expiratory wheeze) and rhonchi (Mostly clears with cough) present.     Comments: Significantly increased AP diameter with proximal breathing.  Not labored. Chest:     Chest wall: No tenderness.  Abdominal:     General: Bowel sounds are normal. There is no distension.     Palpations: Abdomen is soft. There is no mass (No HSM).     Comments: Protuberant, obese abdomen  Musculoskeletal:        General: Swelling (Trivial ankle edema-puffy 1+) present. Normal range  of motion.     Cervical back: Normal range of motion and neck supple.  Skin:    General: Skin is warm and dry.     Coloration: Skin is pale.  Neurological:     Mental Status: He is alert.  Psychiatric:        Thought Content: Thought content normal.     Comments: He is in his usual dysthymic mood-tends to be pretty negative; although his judgment and behavior seems to be somewhat dismissive for his condition, this is his baseline and therefore is not abnormal.     Adult ECG Report  Rate: 61 ;  Rhythm: normal sinus rhythm and ~Incomplete right bundle  branch block.  Otherwise normal axis, normal durations.;   Narrative Interpretation: Stable  Recent Labs:    Lab Results  Component Value Date   CHOL 132 01/30/2020   HDL 34 (L) 01/30/2020   LDLCALC 79 01/30/2020   TRIG 103 01/30/2020   CHOLHDL 3.9 01/30/2020   Lab Results  Component Value Date   CREATININE 1.36 (H) 01/30/2020   BUN 16 01/30/2020   NA 139 01/30/2020   K 4.5 01/30/2020   CL 108 (H) 01/30/2020   CO2 21 01/30/2020   Lab Results  Component Value Date   TSH 1.060 01/30/2020    ASSESSMENT/PLAN    Problem List Items Addressed This Visit    CAD, multiple vessel - status post CABG: LIMA-LAD, SVG-RPDA; PCI native RCA for 100% SVG-RCA - Primary (Chronic)    Basely has complete revascularization now with patent LIMA to LAD beyond the significant 65 to 100% stenosis.  There is decent inline flow providing flow to the major first diagonal branch and native circumflex with its branches.  The RCA recanalized after the graft closed and now has since keeping it open.  He has not had any further angina.  Plan: Continue Plavix monotherapy along with current dose of statin and beta-blocker.  Based on our conversations, unless he has anginal symptoms, would not do any further testing.      Relevant Orders   EKG 12-Lead (Completed)   Peripheral arterial occlusive disease (HCC) (Chronic)    Stable peripheral artery Dopplers.  He has known occluded right iliac that fills via collaterals.  This is not favorable for intervention, and given his baseline, but his cough we chose not to pursue surgical repair at the time.  Left iliac artery is stented, but still has residual 50% stenosis after stenting because of extensive calcification.  This was after atherectomy.  At this point walking as much she can is the best thing to do, but not really walking enough to have claudication symptoms.  No signs of critical limb ischemia.      Relevant Orders   EKG 12-Lead (Completed)   Chronic  respiratory failure with hypoxia (HCC) - SaO2 drops to 87% with ambulation (Chronic)    Refuses to wear oxygen mostly because is difficult to carry.  He has portable tanks.  Perhaps PCP can work with durable medical treatment to get a smaller tanks.  More major issues is he has a hard time to think about affording portable tanks.      Presence of drug coated stent in right coronary artery (Chronic)    2 DES stents placed in previous occluded RCA the recanalized after the graft to the RPDA closed.  They are relatively small diameter stents and is a large area covered.  Continue Plavix monotherapy for combination of CAD and PAD.  Okay to hold Plavix 5 days prior to any procedures or surgeries.  Would probably substitute aspirin 81 mg daily while holding Plavix.      History of non-ST elevation myocardial infarction (NSTEMI) (HCC) (Chronic)    Almost 9 years out from his MI.  This was the setting of a COPD/CHF exacerbation, and he is only had symptoms 1 more time since then back in 2018.  Also does have noticeably been dyspnea.  Preserved EF by echo back at the time of MI.  We have not checked an echocardiogram since 2012, but Myoview in 2018 showed EF of 62%.  Interestingly, the occluded RCA at the time of his MI and initial CABG has now recanalized after the graft closed.      Essential hypertension (Chronic)    Blood pressure pretty well controlled on current meds.  He is tolerating low-dose metoprolol.      Hyperlipidemia with target LDL less than 70 (Chronic)    His lipids are not as great as I would like for them to be.  LDL is 70.  He does not want take any extra medicines and is tolerating the alternating atorvastatin 20 mg 1 day and 40 mg the next day.  This is in conjunction with fenofibrate because of triglycerides which are better controlled. He is not interested in changing.  I tried to see if he would take 40 daily, but he is reluctant to do so.  Labs are being followed by  PCP.      Chronic kidney disease, stage 3, mod decreased GFR (HCC) (Chronic)    Pretty stable creatinine as of June.  Would avoid hypotension.  He is not currently on ARB or ACE inhibitor.  Blood pressure does not require it, and the EF is normal.  He is not diabetic.      COPD  GOLD II with reversibility  (Chronic)    I suspect that his COPD is much worse than it is listed here.  He refuses to take any extra medications and does not want to go see pulmonary medicine.  I think he would benefit from having small size portable oxygen tanks which would give him more mobility, but he has been having a hard time getting this set up.  I think pulmonary medicine could help with, but he does not want to go back.       Other Visit Diagnoses    Status post insertion of iliac artery stent           COVID-19 Education: The signs and symptoms of COVID-19 were discussed with the patient and how to seek care for testing (follow up with PCP or arrange E-visit).   The importance of social distancing and COVID-19 vaccination was discussed today. 4 min The patient is practicing social distancing & Masking.   I spent a total of 29mnutes with the patient spent in direct patient consultation. -> For JStokes the outing to the cardiology office is a major event for him.  He has lots of pent-up frustration and is understandably chronically ill.  He has many concerns complaints and likes to build voice these.  We talked a lot about his health we will start by Covid.  There was a relatively long visit.  Additional time spent with chart review  / charting (studies, outside notes, etc): 14  Total Time: 44 min  Current medicines are reviewed at length with the patient today.  (+/- concerns) n/a  This visit occurred during the SARS-CoV-2  public health emergency.  Safety protocols were in place, including screening questions prior to the visit, additional usage of staff PPE, and extensive cleaning of exam room  while observing appropriate contact time as indicated for disinfecting solutions.  Notice: This dictation was prepared with Dragon dictation along with smaller phrase technology. Any transcriptional errors that result from this process are unintentional and may not be corrected upon review.  Patient Instructions / Medication Changes & Studies & Tests Ordered   Patient Instructions  Medication Instructions:  No changes *If you need a refill on your cardiac medications before your next appointment, please call your pharmacy*   Lab Work: Not needed If you have labs (blood work) drawn today and your tests are completely normal, you will receive your results only by: Marland Kitchen MyChart Message (if you have MyChart) OR . A paper copy in the mail If you have any lab test that is abnormal or we need to change your treatment, we will call you to review the results.   Testing/Procedures: Not needed   Follow-Up: At [facility], you and your health needs are our priority.  As part of our continuing mission to provide you with exceptional heart care, we have created designated Provider Care Teams.  These Care Teams include your primary Cardiologist (physician) and Advanced Practice Providers (APPs -  Physician Assistants and Nurse Practitioners) who all work together to provide you with the care you need, when you need it.  We recommend signing up for the patient portal called "MyChart".  Sign up information is provided on this After Visit Summary.  MyChart is used to connect with patients for Virtual Visits (Telemedicine).  Patients are able to view lab/test results, encounter notes, upcoming appointments, etc.  Non-urgent messages can be sent to your provider as well.   To learn more about what you can do with MyChart, go to [email or web address].    Your next appointment:   12 month(s)  The format for your next appointment:   In Person  Provider:   Glenetta Hew, MD       Studies  Ordered:   Orders Placed This Encounter  Procedures  . EKG 12-Lead     Glenetta Hew, M.D., M.S. Interventional Cardiologist   Pager # [phone] Phone # [phone] [address] [place], [place] [address]   Thank you for choosing Heartcare at [facility]!!

## 2020-06-21 ENCOUNTER — Encounter: Payer: Self-pay | Admitting: Cardiology

## 2020-06-21 NOTE — Assessment & Plan Note (Signed)
Pretty stable creatinine as of June.  Would avoid hypotension.  He is not currently on ARB or ACE inhibitor.  Blood pressure does not require it, and the EF is normal.  He is not diabetic.

## 2020-06-21 NOTE — Assessment & Plan Note (Signed)
Stable peripheral artery Dopplers.  He has known occluded right iliac that fills via collaterals.  This is not favorable for intervention, and given his baseline, but his cough we chose not to pursue surgical repair at the time.  Left iliac artery is stented, but still has residual 50% stenosis after stenting because of extensive calcification.  This was after atherectomy.  At this point walking as much she can is the best thing to do, but not really walking enough to have claudication symptoms.  No signs of critical limb ischemia.

## 2020-06-21 NOTE — Assessment & Plan Note (Signed)
Basely has complete revascularization now with patent LIMA to LAD beyond the significant 65 to 100% stenosis.  There is decent inline flow providing flow to the major first diagonal branch and native circumflex with its branches.  The RCA recanalized after the graft closed and now has since keeping it open.  He has not had any further angina.  Plan: Continue Plavix monotherapy along with current dose of statin and beta-blocker.  Based on our conversations, unless he has anginal symptoms, would not do any further testing.

## 2020-06-21 NOTE — Assessment & Plan Note (Addendum)
His lipids are not as great as I would like for them to be.  LDL is 70.  He does not want take any extra medicines and is tolerating the alternating atorvastatin 20 mg 1 day and 40 mg the next day.  This is in conjunction with fenofibrate because of triglycerides which are better controlled. He is not interested in changing.  I tried to see if he would take 40 daily, but he is reluctant to do so.  Labs are being followed by PCP.

## 2020-06-21 NOTE — Assessment & Plan Note (Signed)
2 DES stents placed in previous occluded RCA the recanalized after the graft to the RPDA closed.  They are relatively small diameter stents and is a large area covered.  Continue Plavix monotherapy for combination of CAD and PAD.   Okay to hold Plavix 5 days prior to any procedures or surgeries.  Would probably substitute aspirin 81 mg daily while holding Plavix.

## 2020-06-21 NOTE — Assessment & Plan Note (Signed)
Blood pressure pretty well controlled on current meds.  He is tolerating low-dose metoprolol.

## 2020-06-21 NOTE — Assessment & Plan Note (Signed)
Refuses to wear oxygen mostly because is difficult to carry.  He has portable tanks.  Perhaps PCP can work with durable medical treatment to get a smaller tanks.  More major issues is he has a hard time to think about affording portable tanks.

## 2020-06-21 NOTE — Assessment & Plan Note (Signed)
Almost 9 years out from his MI.  This was the setting of a COPD/CHF exacerbation, and he is only had symptoms 1 more time since then back in 2018.  Also does have noticeably been dyspnea.  Preserved EF by echo back at the time of MI.  We have not checked an echocardiogram since 2012, but Myoview in 2018 showed EF of 62%.  Interestingly, the occluded RCA at the time of his MI and initial CABG has now recanalized after the graft closed.

## 2020-06-21 NOTE — Assessment & Plan Note (Signed)
I suspect that his COPD is much worse than it is listed here.  He refuses to take any extra medications and does not want to go see pulmonary medicine.  I think he would benefit from having small size portable oxygen tanks which would give him more mobility, but he has been having a hard time getting this set up.  I think pulmonary medicine could help with, but he does not want to go back.

## 2020-06-25 ENCOUNTER — Other Ambulatory Visit: Payer: Self-pay | Admitting: Cardiology

## 2020-07-10 ENCOUNTER — Other Ambulatory Visit: Payer: Self-pay | Admitting: Cardiology

## 2020-07-15 ENCOUNTER — Other Ambulatory Visit: Payer: Self-pay | Admitting: Cardiology

## 2020-07-16 ENCOUNTER — Other Ambulatory Visit: Payer: Self-pay | Admitting: Cardiology

## 2020-07-16 NOTE — Telephone Encounter (Signed)
*  STAT* If patient is at the pharmacy, call can be transferred to refill team.   1. Which medications need to be refilled? (please list name of each medication and dose if known)  clopidogrel (PLAVIX) 75 MG tablet  2. Which pharmacy/location (including street and city if local pharmacy) is medication to be sent to? [facility] [place] - [address] [address]  3. Do they need a 30 day or 90 day supply? 90 day supply   Michael [name] states he went to the pharmacy to pick up this prescription and they advised it has not been approved by Dr. Herbie Baltimore yet. Patient is completely out of medication.

## 2020-08-03 ENCOUNTER — Other Ambulatory Visit: Payer: Self-pay

## 2020-08-03 ENCOUNTER — Emergency Department (HOSPITAL_COMMUNITY): Payer: Medicare HMO

## 2020-08-03 ENCOUNTER — Observation Stay (HOSPITAL_COMMUNITY)
Admission: EM | Admit: 2020-08-03 | Discharge: 2020-08-04 | Disposition: A | Discharge status: Home / Self Care | Payer: Medicare HMO | Attending: Internal Medicine | Admitting: Internal Medicine

## 2020-08-03 ENCOUNTER — Encounter (HOSPITAL_COMMUNITY): Payer: Self-pay | Admitting: Radiology

## 2020-08-03 DIAGNOSIS — J90 Pleural effusion, not elsewhere classified: Secondary | ICD-10-CM | POA: Diagnosis not present

## 2020-08-03 DIAGNOSIS — Z79899 Other long term (current) drug therapy: Secondary | ICD-10-CM | POA: Diagnosis not present

## 2020-08-03 DIAGNOSIS — N183 Chronic kidney disease, stage 3 unspecified: Secondary | ICD-10-CM | POA: Diagnosis not present

## 2020-08-03 DIAGNOSIS — R079 Chest pain, unspecified: Secondary | ICD-10-CM | POA: Diagnosis not present

## 2020-08-03 DIAGNOSIS — R0789 Other chest pain: Secondary | ICD-10-CM | POA: Diagnosis not present

## 2020-08-03 DIAGNOSIS — I2510 Atherosclerotic heart disease of native coronary artery without angina pectoris: Secondary | ICD-10-CM | POA: Diagnosis not present

## 2020-08-03 DIAGNOSIS — K5730 Diverticulosis of large intestine without perforation or abscess without bleeding: Secondary | ICD-10-CM | POA: Diagnosis not present

## 2020-08-03 DIAGNOSIS — Z87891 Personal history of nicotine dependence: Secondary | ICD-10-CM | POA: Insufficient documentation

## 2020-08-03 DIAGNOSIS — K828 Other specified diseases of gallbladder: Secondary | ICD-10-CM | POA: Diagnosis not present

## 2020-08-03 DIAGNOSIS — E039 Hypothyroidism, unspecified: Secondary | ICD-10-CM | POA: Insufficient documentation

## 2020-08-03 DIAGNOSIS — J9811 Atelectasis: Secondary | ICD-10-CM | POA: Diagnosis not present

## 2020-08-03 DIAGNOSIS — I129 Hypertensive chronic kidney disease with stage 1 through stage 4 chronic kidney disease, or unspecified chronic kidney disease: Secondary | ICD-10-CM | POA: Diagnosis not present

## 2020-08-03 DIAGNOSIS — J449 Chronic obstructive pulmonary disease, unspecified: Secondary | ICD-10-CM | POA: Diagnosis not present

## 2020-08-03 DIAGNOSIS — R1010 Upper abdominal pain, unspecified: Principal | ICD-10-CM | POA: Insufficient documentation

## 2020-08-03 DIAGNOSIS — I700 Atherosclerosis of aorta: Secondary | ICD-10-CM | POA: Diagnosis not present

## 2020-08-03 DIAGNOSIS — K429 Umbilical hernia without obstruction or gangrene: Secondary | ICD-10-CM | POA: Diagnosis not present

## 2020-08-03 DIAGNOSIS — R109 Unspecified abdominal pain: Secondary | ICD-10-CM | POA: Diagnosis present

## 2020-08-03 DIAGNOSIS — I10 Essential (primary) hypertension: Secondary | ICD-10-CM | POA: Diagnosis not present

## 2020-08-03 DIAGNOSIS — Z20822 Contact with and (suspected) exposure to covid-19: Secondary | ICD-10-CM | POA: Insufficient documentation

## 2020-08-03 DIAGNOSIS — I491 Atrial premature depolarization: Secondary | ICD-10-CM | POA: Diagnosis not present

## 2020-08-03 DIAGNOSIS — J439 Emphysema, unspecified: Secondary | ICD-10-CM | POA: Diagnosis not present

## 2020-08-03 DIAGNOSIS — K8020 Calculus of gallbladder without cholecystitis without obstruction: Secondary | ICD-10-CM | POA: Diagnosis not present

## 2020-08-03 LAB — HEPATIC FUNCTION PANEL
ALT: 30 U/L (ref 0–44)
AST: 27 U/L (ref 15–41)
Albumin: 3.6 g/dL (ref 3.5–5.0)
Alkaline Phosphatase: 54 U/L (ref 38–126)
Bilirubin, Direct: 0.2 mg/dL (ref 0.0–0.2)
Indirect Bilirubin: 0.6 mg/dL (ref 0.3–0.9)
Total Bilirubin: 0.8 mg/dL (ref 0.3–1.2)
Total Protein: 7.1 g/dL (ref 6.5–8.1)

## 2020-08-03 LAB — CBC
HCT: 44.4 % (ref 39.0–52.0)
Hemoglobin: 14 g/dL (ref 13.0–17.0)
MCH: 30.8 pg (ref 26.0–34.0)
MCHC: 31.5 g/dL (ref 30.0–36.0)
MCV: 97.8 fL (ref 80.0–100.0)
Platelets: 289 10*3/uL (ref 150–400)
RBC: 4.54 MIL/uL (ref 4.22–5.81)
RDW: 13.3 % (ref 11.5–15.5)
WBC: 9.3 10*3/uL (ref 4.0–10.5)
nRBC: 0 % (ref 0.0–0.2)

## 2020-08-03 LAB — BASIC METABOLIC PANEL
Anion gap: 12 (ref 5–15)
BUN: 22 mg/dL (ref 8–23)
CO2: 23 mmol/L (ref 22–32)
Calcium: 9.2 mg/dL (ref 8.9–10.3)
Chloride: 106 mmol/L (ref 98–111)
Creatinine, Ser: 1.64 mg/dL — ABNORMAL HIGH (ref 0.61–1.24)
GFR, Estimated: 43 mL/min — ABNORMAL LOW (ref >60)
Glucose, Bld: 138 mg/dL — ABNORMAL HIGH (ref 70–99)
Potassium: 4.3 mmol/L (ref 3.5–5.1)
Sodium: 141 mmol/L (ref 135–145)

## 2020-08-03 LAB — TROPONIN I (HIGH SENSITIVITY)
Troponin I (High Sensitivity): 9 ng/L (ref <18)
Troponin I (High Sensitivity): 16 ng/L (ref <18)

## 2020-08-03 LAB — LIPASE, BLOOD: Lipase: 32 U/L (ref 11–51)

## 2020-08-03 MED ORDER — ONDANSETRON HCL 4 MG/2ML IJ SOLN
4.0000 mg | Freq: Once | INTRAMUSCULAR | Status: AC
  Administered 2020-08-03: 4 mg via INTRAVENOUS
  Filled 2020-08-03: qty 2

## 2020-08-03 MED ORDER — NITROGLYCERIN 0.4 MG SL SUBL: 0.4000 mg | SUBLINGUAL_TABLET | SUBLINGUAL | Status: DC | PRN

## 2020-08-03 MED ORDER — IOHEXOL 350 MG/ML SOLN
80.0000 mL | Freq: Once | INTRAVENOUS | Status: AC | PRN
  Administered 2020-08-03: 80 mL via INTRAVENOUS

## 2020-08-03 MED ORDER — MORPHINE SULFATE (PF) 4 MG/ML IV SOLN
4.0000 mg | Freq: Once | INTRAVENOUS | Status: AC
  Administered 2020-08-03: 4 mg via INTRAVENOUS
  Filled 2020-08-03: qty 1

## 2020-08-03 MED ORDER — SODIUM CHLORIDE 0.9 % IV BOLUS
500.0000 mL | Freq: Once | INTRAVENOUS | Status: AC
  Administered 2020-08-03: 500 mL via INTRAVENOUS

## 2020-08-03 MED ORDER — FENTANYL CITRATE (PF) 100 MCG/2ML IJ SOLN
100.0000 ug | Freq: Once | INTRAMUSCULAR | Status: AC
  Administered 2020-08-03: 100 ug via INTRAVENOUS
  Filled 2020-08-03: qty 2

## 2020-08-03 NOTE — ED Notes (Signed)
Patient transported to CT 

## 2020-08-03 NOTE — ED Triage Notes (Signed)
Pt with hx of CABG and stent placement here for eval of chest central pain since 1100 this morning. 324 ASA given PTA. Wears 4L O2 at home but not when he leaves the house.

## 2020-08-03 NOTE — ED Notes (Signed)
IV side attempted x 3 no success, unable to get 2nd troponin. ED provider notified.

## 2020-08-03 NOTE — ED Notes (Signed)
Admitting at bedside, vss on ccm, call bell in reach. Side rails up.

## 2020-08-03 NOTE — H&P (Addendum)
Date: 08/03/2020               Patient Name:  Michael [name] MRN: [id number]  DOB: 09/02/1945 Age / Sex: 74 y.o., male   PCP: Lorrene Reid, PA-C         Medical Service: Internal Medicine Teaching Service         Attending Physician: Dr. Lucious Groves, DO    First Contact: Dr. Coy Saunas Pager: [phone]  Second Contact: Dr. Marianna Payment Pager: [phone]       After Hours (After 5p/  First Contact Pager: [phone]  weekends / holidays): Second Contact Pager: [phone]   Chief Complaint: abdominal pain  History of Present Illness:  [name] is 74yo male with CAD s/p CABG, PCI w/ DES, hx NSTEMI, PAD s/p L iliac artery stenting, COPD, CKD III, HTN, HLD, hypothyroidism, prediabetes presenting to [facility] with upper abdominal pain. Patient was in his usual state of health until around 11 this morning when he experienced constant, burning, non-radiating abdominal pain "right below his chest." He mentions his last meal today was breakfast before 6AM, which consisted of sausage, eggs, and gravy. He notes he has had intermittent indigestion over the last few years which usually resolves with Tums. Patient states he tried Tums this morning without relief of pain. Of note, patient mentions he drank some water after onset of pain and subsequently vomited a few minutes later. Since then he has not vomitied or felt nauseous. He states he has had dark stools over the last two days, to which he contributes to intake of chocolate ice cream. Prior to this, he says his stools were yellow because he was eating lots of cheese.  Denies constipation, diarrhea, dysuria, chest pain, acute changes in dyspnea or leg swelling. He does mention a hot/cold spell earlier today. He also endorses chronic difficulty swallowing and cough.   Patient denies recent changes in diet. He states he like to eat what he wants, which is usually unhealthy. Mentions, "if it's healthy, I like to eat the opposite." Patient does state he uses his  oxygen at home, although not consistently. He mentions he does not sleep with oxygen. Overall, patient states he can complete his ADL's, although he cannot walk to the mailbox anymore.    Mr. Sheehan is seen by Lorrene Reid, PA-C for primary care (last visit 04/2020). Patient is also seen by cardiology with Dr. Ellyn Hack (last appointment was 06/21/2020). At that time, it was noted he was not using his oxygen and refused to return to pulmonary clinic for further evaluation.   In ED, patient afebrile, mildly tachypneic, BP 163/60.  Labs notable for hyperglycemia, AKI. CTA chest/abdomen revealed fat stranding around gallbladder. Further investigation via ultrasound revealed distended gallbladder with possible sludge, no findings of acute cholecystitis. IMTS consulted for further evaluation of gallbladder.  Past Medical History:  Diagnosis Date  . Arthritis    "right hip; right wrist" (11/24/2016)  . CAD, multiple vessel 05/2011   90% mid LAD with diffuse proximal stenosis, 100% proximal RCA --> s/p CABG x 2 (LIMA-LAD, SVG-dRCA); b) Lexiscan Myoview 09/2016 w/ Inferior-Inferolateral defect -> Cath:  11/24/16: 100% CTO of SVG-RCA (but nagtive /RCA only subtotal occlusion) --> PCI with DES x2 (resolute onyx DES 2.25 mm x 30 mm, 2.5 mm x 20 mm) RCA  . Carotid artery occlusion   . COPD (chronic obstructive pulmonary disease) with emphysema ([place])   . Essential hypertension   . History of gout   .  History of tobacco abuse    quit 05/2011  . Hyperlipidemia with target LDL less than 70   . Hypothyroidism   . MI (myocardial infarction) ([place]) 05/2011   "I had 3 in 2 days"  . NSTEMI (non-ST elevated myocardial infarction) ([place]) 06/05/2011   With CHF - probably late presentation for missed STEMI --> CATH- MV CAD;; Echo 07/2001: EF > 55%, mild MAC, mild TR, no WMA  with Post-op Setpal dyskinesis, [place] not seen)  . PAD (peripheral artery disease) ([place]) 06/08/2011   a) R Common Iliac 100%, L Common Iliac 80+%  occlusion; b) L CIA - Aterectomy- 10 mm x 40 mm self-extending Stent (postdilated to 7 mm with 20-30% residual)--> Deferred R AoFem bypass;; c) LEA Dopplers 04/2012 & 04/2014 - RABI 0.8 & LABI 1.1 - STABLE, no change  . Pneumonia 1940s X 1  . Presence of drug coated stent in right coronary artery 11/24/2016   p-mRCA: Resolute Onyx DES 2.25 mm x 38 mm --> 2.5 mm x 18 mm (post-dilated from 2.7-2.8 mm)   Meds:  Current Meds  Medication Sig  . atorvastatin (LIPITOR) 20 MG tablet TAKE 1 TABLET BY MOUTH DAILY ALTERNATINGWITH 2 TABLETS EVERY OTHER DAY AT BEDTIME (Patient taking differently: Take 20-40 mg by mouth See admin instructions. Take 20 mg by mouth alternating with 40 mg tablets every other day.)  . clopidogrel (PLAVIX) 75 MG tablet TAKE 1 TABLET BY MOUTH DAILY  . fenofibrate (TRICOR) 48 MG tablet Take 1 tablet (48 mg total) by mouth daily.  Marland Kitchen levothyroxine (SYNTHROID) 75 MCG tablet TAKE 1 TABLET BY MOUTH DAILY (Patient taking differently: Take 75 mcg by mouth daily.)  . metoprolol tartrate (LOPRESSOR) 25 MG tablet TAKE 1 TABLET BY MOUTH TWICE DAILY (Patient taking differently: Take 25 mg by mouth 2 (two) times daily.)  . nitroGLYCERIN (NITROSTAT) 0.4 MG SL tablet Place 1 tablet (0.4 mg total) under the tongue every 5 (five) minutes as needed. After 3 doses, if no relief dial 911  . Vitamin D, Ergocalciferol, (DRISDOL) 1.25 MG (50000 UNIT) CAPS capsule TAKE 1 CAPSULE BY MOUTH EVERY 7 DAYS (Patient taking differently: Take 50,000 Units by mouth every 7 (seven) days.)   Allergies: Allergies as of 08/03/2020 - Review Complete 08/03/2020  Allergen Reaction Noted  . Ace inhibitors Cough 04/29/2013   Family History:  Family History  Problem Relation Age of Onset  . Heart disease Mother        valve replacement  . Diabetes Mother   . Heart failure Mother   . CAD Sister        cardiac stent  . Stroke Sister        stents in neck  . Pneumonia Father    Social History: Patient lives alone in  [place], [place]. He states he can complete all of his ADL's. Previously smoked up to 3ppd for 50 years (quit 9 years ago). States he previously drank alcohol intermittently, but not anymore. Denies ilicit drug use.  Review of Systems: A complete ROS was negative except as per HPI.   Physical Exam: Blood pressure 135/80, pulse 86, temperature 97.8 F (36.6 C), temperature source Oral, resp. rate 20, height _0  (1.702 m), weight 95.3 kg, SpO2 98 %.  Physical Exam Constitutional:      General: He is awake. He is not in acute distress.    Appearance: He is not ill-appearing, toxic-appearing or diaphoretic.  HENT:     Head: Normocephalic and atraumatic.  Mouth/Throat:     Mouth: Mucous membranes are dry. No injury.     Tongue: No lesions.  Eyes:     General: Lids are normal. Vision grossly intact.  Cardiovascular:     Rate and Rhythm: Normal rate and regular rhythm.     Heart sounds: Normal heart sounds.  Pulmonary:     Effort: Tachypnea present.     Comments: Rhonchi bilaterally, R>L Abdominal:     General: Bowel sounds are normal. There is no distension.     Palpations: Abdomen is soft.     Tenderness: There is no abdominal tenderness.  Musculoskeletal:     Comments: Trace RLE non-pitting edema. No edema LLE.  Skin:    General: Skin is warm and dry.     Coloration: Skin is not jaundiced.  Neurological:     Mental Status: He is alert and oriented to person, place, and time.  Psychiatric:        Attention and Perception: Attention normal.        Speech: Speech normal.        Behavior: Behavior is cooperative.    CBC Latest Ref Rng & Units 08/03/2020 01/30/2020 01/15/2020  WBC 4.0 - 10.5 K/uL 9.3 5.4 5.5  Hemoglobin 13.0 - 17.0 g/dL 14.0 13.3 13.9  Hematocrit 39.0 - 52.0 % 44.4 40.6 43.3  Platelets 150 - 400 K/uL 289 259 270   CMP Latest Ref Rng & Units 08/03/2020 01/30/2020 01/15/2020  Glucose 70 - 99 mg/dL 138(H) 90 95  BUN 8 - 23 mg/dL _0 Creatinine 0.61 -  1.24 mg/dL 1.64(H) 1.36(H) 1.45(H)  Sodium 135 - 145 mmol/L 141 139 143  Potassium 3.5 - 5.1 mmol/L 4.3 4.5 5.3(H)  Chloride 98 - 111 mmol/L 106 108(H) 108(H)  CO2 22 - 32 mmol/L _1 Calcium 8.9 - 10.3 mg/dL 9.2 8.9 9.6  Total Protein 6.5 - 8.1 g/dL 7.1 6.4 6.6  Total Bilirubin 0.3 - 1.2 mg/dL 0.8 0.3 0.4  Alkaline Phos 38 - 126 U/L 54 60 57  AST 15 - 41 U/L 27 32 31  ALT 0 - 44 U/L 30 39 42   hs-Trop: 9>16  Lipase: 32  EKG: personally reviewed my interpretation is normal sinus rhythm, borderline QT prolongation, PVC present  CXR: personally reviewed my interpretation is bibasilar haziness, L>R  CTA chest/abdomen/pelvis: There is fat stranding about the gallbladder with mild distention. Further evaluation with ultrasound is recommended. Rectosigmoid diverticulosis without acute inflammation. 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  RUQ ultrasound: Distended gallbladder with possible sludge vs artifact. No gallstones or findings of acute cholecystitis. If clinical concern for acute cholecystitis, recommend nuclear medicine hepatic biliary scan.  Assessment & Plan by Problem: Mr. Reign Bartnick is 74yo male with CAD s/p CABG, PCI w/ DES, hx NSTEMI, PAD s/p L iliac artery stenting, COPD, CKD III, HTN, HLD, hypothyroidism, prediabetes admitted 12/12 with upper abdominal pain, most likely from GERD vs peptic ulcer disease.  Active Problems:   Abdominal pain  #Epigastric abdominal pain Patient endorsing acute-onset epigastric abdominal pain this AM, not relieved with Tums. Reports similar pain intermittently over the last few years. He mentions poor diet as well. He does note some melena and yellow stools recently, which he believes is 2/2 In ED, patient afebrile, mildly tachypneic and hypertensive. Reports pain has been relieved w/ fentanyl. No abdominal tenderness on exam. Imaging in ED revealed distended gallbladder without stones or signs of acute  cholecystitis.  Labs overall unremarkable, no leukocytosis, normal LFT's, Tbili. He does not take PPI at home. Most likely presentation consistent with GERD or peptic ulcer disease. Will keep NPO overnight in case of clinical deterioration or signs of infection concerning for acute cholecystitis. Plan to start PPI, GI cocktail PRN. Can consider HIDA if pain is refractory to medication. - Start Protonix 70m BID - GI cocktail PRN - Zofran PRN - NPO @ [facility], can start diet in AM pending clinical evaluation - Can consider HIDA - R/p CBC, HFP in AM  #Acute kidney injury on CKDIIIa At baseline, sCr ~1.4. Patient mentions he has only eaten this AM given his abdominal pain. Denies decreased po intake in the previous few days. On exam, appears dry. AKI most likely pre-renal, already given 500cc bolus in ED, will give another bolus and monitor function. - 500cc LR - I&O's - Avoid nephrotoxic medications  #Hypertension On arrival, BP 163/60. At home, patient on Lopressor 261mBID. Patient states he only took his AM dose as he's been in the ED all evening. Will re-start his home lopressor and continue to monitor. - C/w home metoprolol tartrate 2521mID  #Hyperglycemia #Prediabetes On arrival, glucose 138. Last A1c in June 5.7. Given his reported diet, will re-check A1c. Will hold any insulin coverage at this time given he is NPO. Will need follow-up with PCP. - R/p A1c - F/u BMP in AM  #COPD Patient denies any acute respiratory symptoms. Mentions he uses oxygen at home and at baseline has some cough, dyspnea. Per chart review, his cardiologist, Dr. HarEllyn Hackas counseled patient numerous times over consistent oxygen use and need for inhalers. Patient has refused pulmonary referral.  - Continue with supplemental O2 - Would benefit from COPD education, further discussions regarding pulmonary referral  #CAD s/p CABG, PCI w/ DES #PAD w/ R iliac artery occlusion Given patient's significant vascular  history, concern for atypical ACS on arrival. EKG with normal sinus rhythm, no ischemic changes from previous studies. Troponin negative x2. Patient mentions his pain is secluded to his abdomen, denies chest pain. He is seen regularly by his cardiologist, Dr. HarEllyn Hackill continue home meds and monitor. Low threshold for further work-up if patient has concerning symptoms. - C/w home Plavix 56m33m  #Hyperlipidemia Last lipid panel in June showed LDL 79, HDL 34. During our discussion, patient mentioned his home medications, including one cholesterol medication that he "only takes every other day." He also endorses poor memory and states he doesn't remember what he took this morning. Likely needs education on medications and would benefit from simplification of home regimen. - C/w home Lipitor 20mg62m- C/w home fenofibrate 48mg 15m#Hypothyroidism Previous TSH 1.06 in June. Will r/p TSH and continue home medication. - C/w home levothyroxine 56mg q77mIET: NPO @ [facility] IVF: 500cc LR bolus DVT PPX: Lovenox CODE: DNR FAM COM: Patient's daughter at bedside during interview.  Dispo: Admit patient to Observation with expected length of stay less than 2 midnights.  Signed: BraswelSanjuan Dame/06/2020, 11:12 PM  Pager: [phone] on weekdays and 1pm on weekends: On Call pager: [phone]

## 2020-08-03 NOTE — ED Provider Notes (Signed)
[facility] EMERGENCY DEPARTMENT Provider Note   CSN: [id number] Arrival date & time: 08/03/20  1517     History Chief Complaint  Patient presents with   Chest Pain    Michael [name] is a 74 y.o. male.  HPI 74 year old male presents with acute onset of lower chest/upper abdomen pain.  Started around 11:30 AM.  He was on the couch when it started.  Pain has been constant and achy ever since.  This does not feel like prior heart attacks.  Chronic dyspnea but this is unchanged today.  No fevers but he did vomit twice.  No diarrhea or back pain.  Given aspirin by EMS.   Past Medical History:  Diagnosis Date   Arthritis    "right hip; right wrist" (11/24/2016)   CAD, multiple vessel 05/2011   90% mid LAD with diffuse proximal stenosis, 100% proximal RCA --> s/p CABG x 2 (LIMA-LAD, SVG-dRCA); b) Lexiscan Myoview 09/2016 w/ Inferior-Inferolateral defect -> Cath:  11/24/16: 100% CTO of SVG-RCA (but nagtive /RCA only subtotal occlusion) --> PCI with DES x2 (resolute onyx DES 2.25 mm x 30 mm, 2.5 mm x 20 mm) RCA   Carotid artery occlusion    COPD (chronic obstructive pulmonary disease) with emphysema (HCC)    Essential hypertension    History of gout    History of tobacco abuse    quit 05/2011   Hyperlipidemia with target LDL less than 70    Hypothyroidism    MI (myocardial infarction) ([place]) 05/2011   "I had 3 in 2 days"   NSTEMI (non-ST elevated myocardial infarction) ([place]) 06/05/2011   With CHF - probably late presentation for missed STEMI --> CATH- MV CAD;; Echo 07/2001: EF > 55%, mild MAC, mild TR, no WMA  with Post-op Setpal dyskinesis, [place] not seen)   PAD (peripheral artery disease) ([place]) 06/08/2011   a) R Common Iliac 100%, L Common Iliac 80+% occlusion; b) L CIA - Aterectomy- 10 mm x 40 mm self-extending Stent (postdilated to 7 mm with 20-30% residual)--> Deferred R AoFem bypass;; c) LEA Dopplers 04/2012 & 04/2014 - RABI 0.8 & LABI 1.1 - STABLE, no change    Pneumonia 1940s X 1   Presence of drug coated stent in right coronary artery 11/24/2016   p-mRCA: Resolute Onyx DES 2.25 mm x 38 mm --> 2.5 mm x 18 mm (post-dilated from 2.7-2.8 mm)    Patient Active Problem List   Diagnosis Date Noted   Abdominal pain 08/03/2020   COPD  GOLD II with reversibility  03/16/2019   Pre-diabetes 03/09/2019   Prediabetes 05/24/2018   Vitamin D deficiency 05/24/2018   Chronic kidney disease, stage 3, mod decreased GFR ([place]) 05/24/2018   Lung nodule < 6cm on CT 10/2017-   Post L Lower Lobe 11/05/2017   Hyperkalemia 07/09/2017   History of tobacco abuse- 2+ppd for 50+ yrs- quit in Jun 08 2011 05/24/2017   Colonoscopy refused 05/24/2017   Noncompliance with immunization regimen 05/24/2017   Personal history of noncompliance with medical treatment 05/24/2017   Presence of drug coated stent in right coronary artery 11/24/2016   HOH (hard of hearing) 11/25/2015   Essential hypertension 11/24/2015    Class: Diagnosis of   Hypothyroidism 11/24/2015    Class: Diagnosis of   Chronic respiratory failure with hypoxia (HCC) - SaO2 drops to 87% with ambulation 06/18/2015   Encounter for long-term (current) use of other medications 05/21/2013   Hyperlipidemia with target LDL less than 70 05/21/2013  Atherosclerosis 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 12/10/2011   Peripheral arterial occlusive disease ([place]) 10/22/2011    Class: Diagnosis of   History of non-ST elevation myocardial infarction (NSTEMI) ([place]) 06/22/2011   Chronic obstructive pulmonary disease ([place]) 06/10/2011    Class: Diagnosis of   History of coronary artery bypass surgery 06/10/2011    Class: History of   CAD, multiple vessel - status post CABG: LIMA-LAD, SVG-RPDA; PCI native RCA for 100% SVG-RCA 06/08/2011    Class: History of   Multiple vessel coronary artery disease 06/08/2011    Past Surgical History:  Procedure Laterality Date    CORONARY ANGIOPLASTY WITH STENT PLACEMENT  11/24/2016   with 100% SVG-RCA & flow in native RCA now present --> PCI-p-m RCA 2 Overlapping Resolute DES (2.25 x 38, 1.5 x 18)   CORONARY ARTERY BYPASS GRAFT  06/10/2011   oLIMA to LAD, SVG to PDA (Dr. Servando Snare)   [place] N/A 11/24/2016   Procedure: Coronary Stent Intervention;  Surgeon: Leonie Man, MD;  Location: [place] [facility] LAB: DES RESOLUTE ONYX 2.25X38 & overlapping DES RESOLUTE ONYX 2.5X18    ILIAC ARTERY STENT  10/27/2011   (Dr. Ellyn Hack Dr. Gwenlyn Found) left CIA - 30mx40mm self-expanding stent, decreasing downt to 20-30% lesion, 100% occluded R CIA with extensive lumbar collateralization to R internal iliac;  05/2016: Ao-Ileac & LEA Dopplers: RABI 0.8, LABI 1.2. B TBI 0.77 -- he has known B disease s/p L CIA stent. RLABI decreased and LABI is normal; aortoiliac atherosclerosis w/ CTO of R CIA but normal REIA.   LEFT HEART CATH AND CORONARY ANGIOGRAPHY  06/09/2011   90% mid LAD with diffuse proximal stenosis, 100% proximal RCA --> s/p CABG x 2 (LIMA-LAD, SVG-dRCA);   LEFT HEART CATH AND CORS/GRAFTS ANGIOGRAPHY N/A 11/24/2016   Procedure: LEFT HEART CATH AND CORS/GRAFTS ANGIOGRAPHY;  Surgeon: DLeonie Man MD;  [facility]: 100% SVG-RCA, but now native RCA only sub-CTO with antegrade flow.  85% mLAD after D1. 40% oOM1 --> PCI of Native RCA   NM MYOVIEW LTD  09/2016    EF 62%. Medium sized, moderate severity defect in the basal inferior, mid inferoseptal and mid inferior walls is partially reversible. - INTERMEDIATE RISK -- CATH   PERCUTANEOUS STENT INTERVENTION Left 10/27/2011   Procedure: PERCUTANEOUS STENT INTERVENTION;  Surgeon: JLorretta Harp MD / DLeonie Man MD;  [facility];  Service: Cardiovascular;  Laterality: Left;   TONSILLECTOMY  ~ 1963   TRANSTHORACIC ECHOCARDIOGRAM  08/19/2011   EF=>55%; mild mitral annular calcif; mild TR       Family History  Problem Relation Age  of Onset   Heart disease Mother        valve replacement   Diabetes Mother    Heart failure Mother    CAD Sister        cardiac stent   Stroke Sister        stents in neck   Pneumonia Father     Social History   Tobacco Use   Smoking status: Former Smoker    Packs/day: 2.00    Years: 51.50    Pack years: 103.00    Types: Cigarettes    Quit date: 06/08/2011    Years since quitting: 9.1   Smokeless tobacco: Former [employer] developer   Types: Chew    Quit date: 07/13/2003  Vaping Use   Vaping Use: Never used  Substance Use Topics   Alcohol use:  No    Comment: 11/24/2016 "nothing in 2 1/2 years; never an alcoholic"   Drug use: No    Home Medications Prior to Admission medications   Medication Sig Start Date End Date Taking? Authorizing Provider  atorvastatin (LIPITOR) 20 MG tablet TAKE 1 TABLET BY MOUTH DAILY ALTERNATINGWITH 2 TABLETS EVERY OTHER DAY AT BEDTIME Patient taking differently: Take 20-40 mg by mouth See admin instructions. Take 20 mg by mouth alternating with 40 mg tablets every other day. 06/25/20  Yes Leonie Man, MD  clopidogrel (PLAVIX) 75 MG tablet TAKE 1 TABLET BY MOUTH DAILY 07/16/20  Yes Leonie Man, MD  fenofibrate (TRICOR) 48 MG tablet Take 1 tablet (48 mg total) by mouth daily. 06/15/19  Yes Leonie Man, MD  levothyroxine (SYNTHROID) 75 MCG tablet TAKE 1 TABLET BY MOUTH DAILY Patient taking differently: Take 75 mcg by mouth daily. 05/22/20  Yes Abonza, Maritza, PA-C  metoprolol tartrate (LOPRESSOR) 25 MG tablet TAKE 1 TABLET BY MOUTH TWICE DAILY Patient taking differently: Take 25 mg by mouth 2 (two) times daily. 07/10/20  Yes Leonie Man, MD  nitroGLYCERIN (NITROSTAT) 0.4 MG SL tablet Place 1 tablet (0.4 mg total) under the tongue every 5 (five) minutes as needed. After 3 doses, if no relief dial 911 03/09/19  Yes Opalski, Deborah, DO  Vitamin D, Ergocalciferol, (DRISDOL) 1.25 MG (50000 UNIT) CAPS capsule TAKE 1 CAPSULE BY MOUTH EVERY 7  DAYS Patient taking differently: Take 50,000 Units by mouth every 7 (seven) days. 09/05/19  Yes Opalski, Neoma Laming, DO  pantoprazole (PROTONIX) 40 MG tablet Take 1 tablet (40 mg total) by mouth 2 (two) times daily. 08/04/20 10/03/20  Marianna Payment, MD    Allergies    Ace inhibitors  Review of Systems   Review of Systems  Respiratory: Negative for shortness of breath.   Cardiovascular: Positive for chest pain.  Gastrointestinal: Positive for abdominal pain and vomiting.  Musculoskeletal: Negative for back pain.  All other systems reviewed and are negative.   Physical Exam Updated Vital Signs BP 115/80 (BP Location: Left Arm)    Pulse 73    Temp 98.7 F (37.1 C) (Oral)    Resp 18    Ht _0  (1.702 m)    Wt 98.7 kg Comment: with [place]. Refused to remove   SpO2 91%    BMI 34.08 kg/m   Physical Exam Vitals and nursing note reviewed.  Constitutional:      General: He is not in acute distress.    Appearance: He is well-developed and well-nourished. He is not ill-appearing or diaphoretic.  HENT:     Head: Normocephalic and atraumatic.     Right Ear: External ear normal.     Left Ear: External ear normal.     Nose: Nose normal.  Eyes:     General:        Right eye: No discharge.        Left eye: No discharge.  Cardiovascular:     Rate and Rhythm: Normal rate and regular rhythm.     Heart sounds: Normal heart sounds.  Pulmonary:     Effort: Pulmonary effort is normal.     Breath sounds: Normal breath sounds.  Chest:     Chest wall: No tenderness.  Abdominal:     Palpations: Abdomen is soft.     Tenderness: There is no abdominal tenderness.  Musculoskeletal:        General: No edema.     Cervical back:  Neck supple.  Skin:    General: Skin is warm and dry.  Neurological:     Mental Status: He is alert.  Psychiatric:        Mood and Affect: Mood is not anxious.     ED Results / Procedures / Treatments   Labs (all labs ordered are listed, but only abnormal results are  displayed) Labs Reviewed  BASIC METABOLIC PANEL - Abnormal; Notable for the following components:      Result Value   Glucose, Bld 138 (*)    Creatinine, Ser 1.64 (*)    GFR, Estimated 43 (*)    All other components within normal limits  COMPREHENSIVE METABOLIC PANEL - Abnormal; Notable for the following components:   Glucose, Bld 104 (*)    Creatinine, Ser 1.48 (*)    Total Protein 6.1 (*)    Albumin 3.1 (*)    GFR, Estimated 49 (*)    All other components within normal limits  CBC - Abnormal; Notable for the following components:   WBC 10.7 (*)    All other components within normal limits  HEMOGLOBIN A1C - Abnormal; Notable for the following components:   Hgb A1c MFr Bld 5.8 (*)    All other components within normal limits  RESP PANEL BY RT-PCR (FLU A&B, COVID) ARPGX2  CBC  HEPATIC FUNCTION PANEL  LIPASE, BLOOD  MAGNESIUM  TSH  PROTIME-INR  TROPONIN I (HIGH SENSITIVITY)  TROPONIN I (HIGH SENSITIVITY)    EKG EKG Interpretation  Date/Time:  Saturday August 03 2020 15:14:54 EST Ventricular Rate:  70 PR Interval:  196 QRS Duration: 100 QT Interval:  430 QTC Calculation: 464 R Axis:   -3 Text Interpretation: Sinus rhythm with frequent Premature ventricular complexes Incomplete right bundle branch block Borderline ECG Confirmed by Sherwood Gambler [phone]) on 08/03/2020 4:02:08 PM   Radiology DG Chest 2 View  Result Date: 08/03/2020 CLINICAL DATA:  Chest pain. EXAM: CHEST - 2 VIEW COMPARISON:  November 13, 2016. FINDINGS: The heart size and mediastinal contours are within normal limits. Status post coronary bypass graft. No pneumothorax is noted. Stable bibasilar scarring or subsegmental atelectasis is noted. No pleural effusion is noted. The visualized skeletal structures are unremarkable. IMPRESSION: Stable bibasilar scarring or subsegmental atelectasis. Electronically Signed   By: Marijo Conception M.D.   On: 08/03/2020 16:19   CT Angio Chest/Abd/Pel for Dissection W  and/or Wo Contrast  Result Date: 08/03/2020 CLINICAL DATA:  Chest pain EXAM: CT ANGIOGRAPHY CHEST, ABDOMEN AND PELVIS TECHNIQUE: Non-contrast CT of the chest was initially obtained. Multidetector CT imaging through the chest, abdomen and pelvis was performed using the standard protocol during bolus administration of intravenous contrast. Multiplanar reconstructed images and MIPs were obtained and reviewed to evaluate the vascular anatomy. CONTRAST:  54m OMNIPAQUE IOHEXOL 350 MG/ML SOLN COMPARISON:  CT chest dated 05/04/2019 FINDINGS: CTA CHEST FINDINGS Cardiovascular: There is no evidence for thoracic aortic dissection or aneurysm. There are atherosclerotic changes of the thoracic aorta. The arch vessels are patent where visualized. There are coronary artery calcifications. The heart size is unremarkable. There is no large centrally located pulmonary embolism. Mediastinum/Nodes: -- No mediastinal lymphadenopathy. -- No hilar lymphadenopathy. -- No axillary lymphadenopathy. -- No supraclavicular lymphadenopathy. -- Normal thyroid gland where visualized. -  Unremarkable esophagus. Lungs/Pleura: There is chronic atelectasis at lung bases, left greater than right. There are moderate severe changes bilaterally. The trachea is unremarkable. There is no pneumothorax. Musculoskeletal: No chest wall abnormality. No bony spinal canal stenosis. Review of  the MIP images confirms the above findings. CTA ABDOMEN AND PELVIS FINDINGS VASCULAR Aorta: There are advanced atherosclerotic changes throughout the abdominal aorta without evidence for an aneurysm. Celiac: Patent without evidence of aneurysm, dissection, vasculitis or significant stenosis. SMA: There is mild narrowing at the origin of the SMA. Renals: Both renal arteries are patent without evidence of aneurysm, dissection, vasculitis, fibromuscular dysplasia or significant stenosis. IMA: Patent without evidence of aneurysm, dissection, vasculitis or significant stenosis.  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 Veins: No obvious venous abnormality within the limitations of this arterial phase study. Review of the MIP images confirms the above findings. NON-VASCULAR Hepatobiliary: The liver is normal. There is fat stranding about the gallbladder. The gallbladder is mildly distended.There is no biliary ductal dilation. Pancreas: Normal contours without ductal dilatation. No peripancreatic fluid collection. Spleen: Unremarkable. Adrenals/Urinary Tract: --Adrenal glands: Unremarkable. --Right kidney/ureter: No hydronephrosis or radiopaque kidney stones. --Left kidney/ureter: No hydronephrosis or radiopaque kidney stones. --Urinary bladder: Unremarkable. Stomach/Bowel: --Stomach/Duodenum: No hiatal hernia or other gastric abnormality. Normal duodenal course and caliber. --Small bowel: Unremarkable. --Colon: Rectosigmoid diverticulosis without acute inflammation. --Appendix: Normal. Lymphatic: --No retroperitoneal lymphadenopathy. --No mesenteric lymphadenopathy. --No pelvic or inguinal lymphadenopathy. Reproductive: Unremarkable Other: No ascites or free air. There is a fat containing umbilical hernia. Musculoskeletal. No acute displaced fractures. Review of the MIP images confirms the above findings. IMPRESSION: 1. No evidence for aortic dissection or aneurysm. 2. There is fat stranding about the gallbladder with mild distention. Further evaluation with ultrasound is recommended. 3. Rectosigmoid diverticulosis without acute inflammation. 4. 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 Aortic Atherosclerosis (ICD10-I70.0) and Emphysema (ICD10-J43.9). Electronically Signed   By: Constance Holster M.D.   On: 08/03/2020 19:47   US Abdomen Limited RUQ (LIVER/GB)  Result Date: 08/03/2020 CLINICAL DATA:  Upper abdominal pain. EXAM: ULTRASOUND ABDOMEN LIMITED RIGHT UPPER QUADRANT COMPARISON:  Chest and abdomen CT earlier  today. FINDINGS: Gallbladder: Technically challenging and limited exam due to patient's anatomy and inability to lay flat in breath hold. Gallbladder distension. No gallstones or wall thickening visualized. There may be intraluminal sludge versus artifact. No sonographic Murphy sign noted by sonographer. Common bile duct: Diameter: 3-4 mm. Liver: Technically challenging exam due to patient's anatomy with liver located under the hemidiaphragm. Left lobe is also poorly visualized. There is no evidence of focal lesion. Parenchymal echogenicity is normal. Portal vein is patent on color Doppler imaging with normal direction of blood flow towards the liver. Other: No right upper quadrant ascites. IMPRESSION: 1. Distended gallbladder with possible sludge versus artifact. No gallstones or sonographic findings of acute cholecystitis. Examination was technically difficult and limited due to patient's anatomy and difficulty tolerating the exam. If there is persistent clinical concern for acute cholecystitis, recommend nuclear medicine hepatic biliary scan. 2. No biliary dilatation. Electronically Signed   By: Keith Rake M.D.   On: 08/03/2020 21:12    Procedures Ultrasound ED Peripheral IV (Provider)  Date/Time: 08/03/2020 5:43 PM Performed by: Sherwood Gambler, MD Authorized by: Sherwood Gambler, MD   Procedure details:    Indications: multiple failed IV attempts and poor IV access     Skin Prep: chlorhexidine gluconate     Location:  Right AC   Angiocath:  18 G   Bedside Ultrasound Guided: Yes     Patient tolerated procedure without complications: Yes     Dressing applied: Yes     (including critical care time)  Medications Ordered in ED Medications  nitroGLYCERIN (NITROSTAT) SL tablet 0.4 mg (has no administration in time range)  metoprolol tartrate (LOPRESSOR) tablet 25 mg (25 mg Oral Given 08/04/20 1041)  atorvastatin (LIPITOR) tablet 20 mg (20 mg Oral Given 08/04/20 1041)  levothyroxine  (SYNTHROID) tablet 75 mcg (75 mcg Oral Given 08/04/20 0616)  clopidogrel (PLAVIX) tablet 75 mg (75 mg Oral Given 08/04/20 1041)  enoxaparin (LOVENOX) injection 40 mg (40 mg Subcutaneous Given 08/04/20 1041)  acetaminophen (TYLENOL) tablet 650 mg (has no administration in time range)    Or  acetaminophen (TYLENOL) suppository 650 mg (has no administration in time range)  ondansetron (ZOFRAN) tablet 4 mg (has no administration in time range)    Or  ondansetron (ZOFRAN) injection 4 mg (has no administration in time range)  pantoprazole (PROTONIX) EC tablet 40 mg (40 mg Oral Given 08/04/20 1041)  alum & mag hydroxide-simeth (MAALOX/MYLANTA) 200-200-20 MG/5ML suspension 30 mL (has no administration in time range)    And  lidocaine (XYLOCAINE) 2 % viscous mouth solution 15 mL (has no administration in time range)  ondansetron (ZOFRAN) injection 4 mg (4 mg Intravenous Given 08/03/20 1747)  sodium chloride 0.9 % bolus 500 mL ( Intravenous Stopped 08/03/20 1901)  morphine 4 MG/ML injection 4 mg (4 mg Intravenous Given 08/03/20 1750)  iohexol (OMNIPAQUE) 350 MG/ML injection 80 mL (80 mLs Intravenous Contrast Given 08/03/20 1914)  fentaNYL (SUBLIMAZE) injection 100 mcg (100 mcg Intravenous Given 08/03/20 2030)  lactated ringers bolus 500 mL (500 mLs Intravenous New Bag/Given 08/04/20 0024)    ED Course  I have reviewed the triage vital signs and the nursing notes.  Pertinent labs & imaging results that were available during my care of the patient were reviewed by me and considered in my medical decision making (see chart for details).    MDM Rules/Calculators/A&P                          Patient with CP/abd pain. Troponins negative, though the second did increase compared to first. This doesn't feel like prior MI. Thus workup ensued including CTA for dissection, which was followed up by RUQ ultrasound. Given he is still having significant pain, will admit for obs and likely HIDA to rule out  cholecystitis.  Final Clinical Impression(s) / ED Diagnoses Final diagnoses:  Upper abdominal pain    Rx / DC Orders ED Discharge Orders         Ordered    pantoprazole (PROTONIX) 40 MG tablet  2 times daily        08/04/20 1148    Increase activity slowly        08/04/20 1148    Diet - low sodium heart healthy        08/04/20 1148    Discharge instructions       Comments: FOLLOW UP INSTRUCTIONS:  1. You were hospitalized for abdominal pain. Thank you for allowing [place] to be part of your care.    2. Please follow up with the following providers:  Primary care physician - please follow-up with your PCP to discuss COPD medications and a referral to gastroenterology to look for stomach ulcers.  3. Please note these changes made to your medications:   A. Medications to continue:  atorvastatin (LIPITOR) 20 MG tablet, TAKE 1 TABLET BY MOUTH DAILY ALTERNATINGWITH 2 TABLETS EVERY OTHER DAY AT BEDTIME (Patient taking differently: Take 20-40 mg by mouth See admin instructions. Take 20 mg by mouth alternating with 40 mg tablets  every other day.) clopidogrel (PLAVIX) 75 MG tablet, TAKE 1 TABLET BY MOUTH DAILY fenofibrate (TRICOR) 48 MG tablet, Take 1 tablet (48 mg total) by mouth daily. levothyroxine (SYNTHROID) 75 MCG tablet, TAKE 1 TABLET BY MOUTH DAILY (Patient taking differently: Take 75 mcg by mouth daily.) metoprolol tartrate (LOPRESSOR) 25 MG tablet, TAKE 1 TABLET BY MOUTH TWICE DAILY (Patient taking differently: Take 25 mg by mouth 2 (two) times daily.) nitroGLYCERIN (NITROSTAT) 0.4 MG SL tablet, Place 1 tablet (0.4 mg total) under the tongue every 5 (five) minutes as needed. After 3 doses, if no relief dial 911 Vitamin D, Ergocalciferol, (DRISDOL) 1.25 MG (50000 UNIT) CAPS capsule, TAKE 1 CAPSULE BY MOUTH EVERY 7 DAYS (Patient taking differently: Take 50,000 Units by mouth every 7 (seven) days.)  B. Medications to start:  pantoprazole (PROTONIX) EC tablet 40 mg, Oral, twice daily  for 4 -8 week  C. Medications to discontinue:   Nonsteroidal anti-inflammatory medications such as ibuprofen and Motrin.  Please make sure to follow-up with your primary care provider and take your acid reducing medication, Protonix.  Please call our clinic if you have any questions or concerns, we may be able to help and keep you from a long and expensive emergency room wait. Our clinic and after hours phone number is [phone], the best time to call is Monday through Friday 9 am to 4 pm but there is always someone available 24/7 if you have an emergency. If you need medication refills please notify your pharmacy one week in advance and they will send [place] a request.   08/04/20 1148           Sherwood Gambler, MD 08/04/20 1241

## 2020-08-04 DIAGNOSIS — R1010 Upper abdominal pain, unspecified: Secondary | ICD-10-CM

## 2020-08-04 DIAGNOSIS — E039 Hypothyroidism, unspecified: Secondary | ICD-10-CM

## 2020-08-04 DIAGNOSIS — N179 Acute kidney failure, unspecified: Secondary | ICD-10-CM

## 2020-08-04 DIAGNOSIS — I10 Essential (primary) hypertension: Secondary | ICD-10-CM | POA: Diagnosis not present

## 2020-08-04 DIAGNOSIS — I2510 Atherosclerotic heart disease of native coronary artery without angina pectoris: Secondary | ICD-10-CM

## 2020-08-04 DIAGNOSIS — J449 Chronic obstructive pulmonary disease, unspecified: Secondary | ICD-10-CM | POA: Diagnosis not present

## 2020-08-04 LAB — COMPREHENSIVE METABOLIC PANEL
ALT: 25 U/L (ref 0–44)
AST: 25 U/L (ref 15–41)
Albumin: 3.1 g/dL — ABNORMAL LOW (ref 3.5–5.0)
Alkaline Phosphatase: 42 U/L (ref 38–126)
Anion gap: 10 (ref 5–15)
BUN: 19 mg/dL (ref 8–23)
CO2: 24 mmol/L (ref 22–32)
Calcium: 8.9 mg/dL (ref 8.9–10.3)
Chloride: 106 mmol/L (ref 98–111)
Creatinine, Ser: 1.48 mg/dL — ABNORMAL HIGH (ref 0.61–1.24)
GFR, Estimated: 49 mL/min — ABNORMAL LOW (ref >60)
Glucose, Bld: 104 mg/dL — ABNORMAL HIGH (ref 70–99)
Potassium: 4.8 mmol/L (ref 3.5–5.1)
Sodium: 140 mmol/L (ref 135–145)
Total Bilirubin: 1.1 mg/dL (ref 0.3–1.2)
Total Protein: 6.1 g/dL — ABNORMAL LOW (ref 6.5–8.1)

## 2020-08-04 LAB — CBC
HCT: 41.7 % (ref 39.0–52.0)
Hemoglobin: 13.5 g/dL (ref 13.0–17.0)
MCH: 31 pg (ref 26.0–34.0)
MCHC: 32.4 g/dL (ref 30.0–36.0)
MCV: 95.9 fL (ref 80.0–100.0)
Platelets: 256 10*3/uL (ref 150–400)
RBC: 4.35 MIL/uL (ref 4.22–5.81)
RDW: 13.6 % (ref 11.5–15.5)
WBC: 10.7 10*3/uL — ABNORMAL HIGH (ref 4.0–10.5)
nRBC: 0 % (ref 0.0–0.2)

## 2020-08-04 LAB — PROTIME-INR
INR: 1 (ref 0.8–1.2)
Prothrombin Time: 13 seconds (ref 11.4–15.2)

## 2020-08-04 LAB — RESP PANEL BY RT-PCR (FLU A&B, COVID) ARPGX2
Influenza A by PCR: NEGATIVE
Influenza B by PCR: NEGATIVE
SARS Coronavirus 2 by RT PCR: NEGATIVE

## 2020-08-04 LAB — TSH: TSH: 2.126 u[IU]/mL (ref 0.350–4.500)

## 2020-08-04 LAB — MAGNESIUM: Magnesium: 1.7 mg/dL (ref 1.7–2.4)

## 2020-08-04 LAB — HEMOGLOBIN A1C
Hgb A1c MFr Bld: 5.8 % — ABNORMAL HIGH (ref 4.8–5.6)
Mean Plasma Glucose: 119.76 mg/dL

## 2020-08-04 MED ORDER — PANTOPRAZOLE SODIUM 40 MG PO TBEC
40.0000 mg | DELAYED_RELEASE_TABLET | Freq: Two times a day (BID) | ORAL | Status: DC
  Administered 2020-08-04 (×2): 40 mg via ORAL
  Filled 2020-08-04 (×2): qty 1

## 2020-08-04 MED ORDER — ALUM & MAG HYDROXIDE-SIMETH 200-200-20 MG/5ML PO SUSP: 30.0000 mL | Freq: Once | ORAL | Status: DC | PRN

## 2020-08-04 MED ORDER — LEVOTHYROXINE SODIUM 75 MCG PO TABS
75.0000 ug | ORAL_TABLET | Freq: Every day | ORAL | Status: DC
  Administered 2020-08-04: 75 ug via ORAL
  Filled 2020-08-04: qty 1

## 2020-08-04 MED ORDER — CLOPIDOGREL BISULFATE 75 MG PO TABS
75.0000 mg | ORAL_TABLET | Freq: Every day | ORAL | Status: DC
  Administered 2020-08-04: 75 mg via ORAL
  Filled 2020-08-04: qty 1

## 2020-08-04 MED ORDER — ACETAMINOPHEN 325 MG PO TABS: 650.0000 mg | ORAL_TABLET | Freq: Four times a day (QID) | ORAL | Status: DC | PRN

## 2020-08-04 MED ORDER — LIDOCAINE VISCOUS HCL 2 % MT SOLN
15.0000 mL | Freq: Once | OROMUCOSAL | Status: DC | PRN
  Filled 2020-08-04: qty 15

## 2020-08-04 MED ORDER — ONDANSETRON HCL 4 MG/2ML IJ SOLN: 4.0000 mg | Freq: Four times a day (QID) | INTRAMUSCULAR | Status: DC | PRN

## 2020-08-04 MED ORDER — ATORVASTATIN CALCIUM 10 MG PO TABS
20.0000 mg | ORAL_TABLET | Freq: Every day | ORAL | Status: DC
  Administered 2020-08-04: 20 mg via ORAL
  Filled 2020-08-04: qty 2

## 2020-08-04 MED ORDER — ACETAMINOPHEN 650 MG RE SUPP: 650.0000 mg | Freq: Four times a day (QID) | RECTAL | Status: DC | PRN

## 2020-08-04 MED ORDER — LACTATED RINGERS IV BOLUS
500.0000 mL | Freq: Once | INTRAVENOUS | Status: AC
  Administered 2020-08-04: 500 mL via INTRAVENOUS

## 2020-08-04 MED ORDER — PANTOPRAZOLE SODIUM 40 MG PO TBEC
40.0000 mg | DELAYED_RELEASE_TABLET | Freq: Two times a day (BID) | ORAL | 1 refills | Status: DC
Start: 2020-08-04 — End: 2020-08-07

## 2020-08-04 MED ORDER — LIDOCAINE VISCOUS HCL 2 % MT SOLN: 15.0000 mL | Freq: Once | OROMUCOSAL | Status: DC

## 2020-08-04 MED ORDER — PANTOPRAZOLE SODIUM 40 MG PO TBEC: 40.0000 mg | DELAYED_RELEASE_TABLET | Freq: Two times a day (BID) | ORAL | 1 refills | Status: DC

## 2020-08-04 MED ORDER — ENOXAPARIN SODIUM 40 MG/0.4ML ~~LOC~~ SOLN
40.0000 mg | SUBCUTANEOUS | Status: DC
  Administered 2020-08-04: 40 mg via SUBCUTANEOUS
  Filled 2020-08-04: qty 0.4

## 2020-08-04 MED ORDER — METOPROLOL TARTRATE 25 MG PO TABS
25.0000 mg | ORAL_TABLET | Freq: Two times a day (BID) | ORAL | Status: DC
  Administered 2020-08-04 (×2): 25 mg via ORAL
  Filled 2020-08-04 (×2): qty 1

## 2020-08-04 MED ORDER — ONDANSETRON HCL 4 MG PO TABS: 4.0000 mg | ORAL_TABLET | Freq: Four times a day (QID) | ORAL | Status: DC | PRN

## 2020-08-04 NOTE — ED Notes (Signed)
Attempted to give report, this rn to be called back.  

## 2020-08-04 NOTE — Progress Notes (Signed)
Subjective:   Hospital day: 1  Overnight event: Patient states he was doing okay this morning.  States he had pain while in the ED but after he was given medication here in the floor, his pain resolved.  Patient states his epigastric pain started after eating sausage but it did not resolve after using Tums like his usual indigestion.  Patient was informed that his abdominal imaging did not show any signs of infection his gallbladder or his pancreas. He was informed that his pain is likely due to an ulcer in his stomach and this usually requires an upper endoscopy to see. Patient states he does not want any tubes inserted into him so he has been doing stool tests every 3 years instead of the usual colonoscopy.  He was informed that we will plan to treat with proton pump inhibitor.  Objective:  Vital signs in last 24 hours: Vitals:   08/04/20 0200 08/04/20 0215 08/04/20 0240 08/04/20 0426  BP: 135/66 116/85 (!) 145/97 115/80  Pulse: 72 73 76 73  Resp: 18 18 20 18   Temp:   98.3 F (36.8 C) 98.7 F (37.1 C)  TempSrc:   Oral Oral  SpO2: 100% 100% 99% 91%  Weight:   98.7 kg   Height:   5\' 7"  (1.702 m)     Physical Exam  General: Pleasant, well-appearing elderly man laying in bed. No acute distress. Head: Normocephalic. Atraumatic. CV: RRR. No murmurs, rubs, or gallops.  Trace RLE edema. Pulmonary: Lungs CTAB. Normal effort. No wheezing or rales. Abdominal: Soft, nontender, nondistended. Normal bowel sounds. Extremities: Palpable pulses. Normal ROM. Skin: Warm and dry. No obvious rash or lesions. Neuro: A&Ox3. Moves all extremities. Normal sensation. No focal deficit. Psych: Normal mood and affect  Assessment/Plan: Michael [name] is a 74 y.o. male with CAD s/p CABG, PCI w/ DES, hx NSTEMI, PAD s/p L iliac artery stenting, COPD, CKD III, HTN, HLD, hypothyroidism, prediabetes admitted 12/12 with acute onset of epigastric pain that improved with PPIs.  Pain likely secondary to peptic ulcer  disease.  Plan for discharge today with PPI therapy.   Active Problems:   Abdominal pain  Epigastric abdominal pain Patient with a history of indigestion presented with acute onset of epigastric pain not relieved by Tums.  Patient was found to be tachypneic and hypotensive but afebrile.  Labs were unremarkable.  Abdominal imaging negative for pancreatitis, cholecystitis or other signs of infection.  Patient started on PPI with significant improvement in pain.  Patient denies abdominal pain this morning.  Presentation likely secondary to peptic ulcer disease.  This will need to be assessed with an EGD however patient does not want any scopes.  Discharging today on PPI therapy --Continue Protonix 40 mg twice daily at home --Follow-up with PCP and consider GI specialist referral  AKI on CKD stage III Baseline creatinine around 1.4.  Patient reported decreased p.o. intake secondary to abdominal pain.  Patient was given IV fluids.  Renal function improved with creatinine of 1.48 on day of discharge. --Encouraged to stay hydrated --Follow-up with PCP  COPD Patient did not report any acute respiratory symptoms.  States he is on oxygen at home.  Patient has refused pulmonology referral in the past. --Will benefit from COPD education and maintenance inhaler  Hypertension BP elevated with systolic in the 160s.  Continue on home beta-blocker.  BP improved to 115/80 on day of discharge. --Continue on home Lopressor  CAD Continue home Plavix  Hypothyroidism Continue home Synthroid  Diet:  HH IVF: IV LR VTE: Lovenox CODE: DNR  Prior to Admission Living Arrangement: Home Anticipated Discharge Location: Home Barriers to Discharge: None Dispo: Anticipated discharge today  Signed: Steffanie Rainwater, MD 08/04/2020, 1:46 PM  Pager: [phone] Internal Medicine Teaching Service After 5pm on weekdays and 1pm on weekends: On Call pager: [phone]

## 2020-08-04 NOTE — Progress Notes (Signed)
Pt's stable, DC home via wheelchair 

## 2020-08-04 NOTE — Hospital Course (Signed)
Mr. Michael [name] is 74yo male with CAD s/p CABG, PCI w/ DES, hx NSTEMI, PAD s/p L iliac artery stenting, COPD, CKD III, HTN, HLD, hypothyroidism, prediabetes admitted 12/12 with upper abdominal pain, most likely from GERD vs peptic ulcer disease.  1.  Epigastric abdominal pain secondary to GERD versus peptic ulcer disease:  Patient presented to [facility] with acute onset epigastric pain that he describes as constant, burning, nonradiating abdominal pain right below his chest with associated vomiting.  He only had one episode of vomiting that was nonbilious nonbloody vomit.  Patient states that he has had some dark stools but had a difficult time describing the quality of his stools but denies any bright red blood.  Patient also had associated difficulty swallowing and cough that was chronic for him.  CTA showed some fat stranding of the gallbladder with mild distention and some mild rectosigmoid diverticulosis without inflammation.  Follow-up right upper quadrant ultrasound showed a distended gallbladder with possible sludge versus artifact with no findings of acute cholecystitis or cholelithiasis.  A GI cocktail and started on Protonix 40 mg twice daily which improved his epigastric pain.  Duration the subsequent morning patient was pain-free and requesting to go home.  Patient was discharged with instructions to follow-up with his primary care doctor to discuss PPI treatment and GI follow-up.  2.  Acute kidney injury on CKD stage III:  Patient presented with an acute elevation in his creatinine from baseline of 1.4 that resolved with IV fluids  3.  COPD:  Over the chest x-ray showing bibasilar haziness and history of COPD.  Patient denied any acute respiratory complaints.  Patient's cardiologist Dr. Herbie Baltimore, has counseled him on using supplemental oxygen and inhalers at home without success.  Patient was previously referred to a pulmonologist but decided to stop seeing this provider stating  that he does not believe it is helping.  Patient likely needs further evaluation and management through his PCP once discharged.   4: CAD s/p CABG, PCI with DES different peripheral artery disease:  Patient presented with epigastric pain/atypical cardiac chest pain with no EKG abnormalities and negative troponins x2.  He was continued on home dose of Plavix 75 mg daily.  Patient should follow-up with his cardiologist in the outpatient setting.

## 2020-08-04 NOTE — Progress Notes (Signed)
Pt's family requested to talk to MD about his test result before being DC'ed home. Resident made aware.

## 2020-08-04 NOTE — Plan of Care (Signed)
?  Problem: Clinical Measurements: ?Goal: Cardiovascular complication will be avoided ?Outcome: Progressing ?  ?Problem: Activity: ?Goal: Risk for activity intolerance will decrease ?Outcome: Progressing ?  ?Problem: Pain Managment: ?Goal: General experience of comfort will improve ?Outcome: Progressing ?  ?

## 2020-08-04 NOTE — Discharge Summary (Addendum)
Name: Michael [name] MRN: [id number] DOB: 08/05/1946 74 y.o. PCP: Mayer MaskerAbonza, Maritza, PA-C  Date of Admission: 08/03/2020  3:17 PM Date of Discharge: 08/04/2020 Attending Physician: Dr. Cleda DaubE. Hoffman  Discharge Diagnosis: Active Problems:   Abdominal pain    Discharge Medications: Allergies as of 08/04/2020       Reactions   Ace Inhibitors Cough   Pt reports he is not allergic to this 08/03/2020        Medication List     TAKE these medications    atorvastatin 20 MG tablet Commonly known as: LIPITOR TAKE 1 TABLET BY MOUTH DAILY ALTERNATINGWITH 2 TABLETS EVERY OTHER DAY AT BEDTIME What changed:  how much to take how to take this when to take this additional instructions   clopidogrel 75 MG tablet Commonly known as: PLAVIX TAKE 1 TABLET BY MOUTH DAILY   fenofibrate 48 MG tablet Commonly known as: TRICOR Take 1 tablet (48 mg total) by mouth daily.   levothyroxine 75 MCG tablet Commonly known as: SYNTHROID TAKE 1 TABLET BY MOUTH DAILY   metoprolol tartrate 25 MG tablet Commonly known as: LOPRESSOR TAKE 1 TABLET BY MOUTH TWICE DAILY   nitroGLYCERIN 0.4 MG SL tablet Commonly known as: Nitrostat Place 1 tablet (0.4 mg total) under the tongue every 5 (five) minutes as needed. After 3 doses, if no relief dial 911   pantoprazole 40 MG tablet Commonly known as: PROTONIX Take 1 tablet (40 mg total) by mouth 2 (two) times daily.   Vitamin D (Ergocalciferol) 1.25 MG (50000 UNIT) Caps capsule Commonly known as: DRISDOL TAKE 1 CAPSULE BY MOUTH EVERY 7 DAYS What changed: See the new instructions.        Disposition and follow-up:   Michael [name] was discharged [facility] in Stable condition.  At the hospital follow up visit please address:  1.  Follow-up:  A. Abdominal pain: Patient's abdominal pain is likely GERD or PUD. He was discharged with PPI therapy. Patient does not want a scope. Will need further work up if pain is not improved  while on PPI.     B. COPD: Patient would likely need a maintenance inhaler. Please consider further evaluation and treatment.   2.  Labs / imaging needed at time of follow-up: BMP, CBC  3.  Pending labs/ test needing follow-up: None  Follow-up Appointments:  Follow-up Information     Mayer Maskerbonza, Maritza, PA-C. Schedule an appointment as soon as possible for a visit in 1 week(s).   Specialty: Physician Assistant Contact information: [address] [place] [address] [phone]         Marykay LexHarding, David W, MD .   Specialty: Cardiology Contact information: [address] [place] [address] [phone]                 Hospital Course by problem list: Michael [name] is 74yo male with CAD s/p CABG, PCI w/ DES, hx NSTEMI, PAD s/p L iliac artery stenting, COPD, CKD III, HTN, HLD, hypothyroidism, prediabetes admitted 12/12 with upper abdominal pain, most likely from GERD vs peptic ulcer disease.  1.  Epigastric abdominal pain secondary to GERD versus peptic ulcer disease:  [facility] with acute onset epigastric pain that he describes as constant, burning, nonradiating abdominal pain right below his chest with associated vomiting.  He only had one episode of vomiting that was nonbilious nonbloody vomit.  Patient states that he has had some dark stools but  had a difficult time describing the quality of his stools but denies any bright red blood.  Patient also had associated difficulty swallowing and cough that was chronic for him.  CTA showed some fat stranding of the gallbladder with mild distention and some mild rectosigmoid diverticulosis without inflammation.  Follow-up right upper quadrant ultrasound showed a distended gallbladder with possible sludge versus artifact with no findings of acute cholecystitis or cholelithiasis.  A GI cocktail and started on Protonix 40 mg twice daily which improved his epigastric pain.   Duration the subsequent morning patient was pain-free and requesting to go home.  Patient was discharged with instructions to follow-up with his primary care doctor to discuss PPI treatment and GI follow-up.  2.  Acute kidney injury on CKD stage IIIa:  Patient presented with an acute elevation in his creatinine from baseline of 1.4 that resolved with IV fluids  3.  COPD:  Over the chest x-ray showing bibasilar haziness and history of COPD.  Patient denied any acute respiratory complaints.  Patient's cardiologist Dr. Herbie Baltimore, has counseled him on using supplemental oxygen and inhalers at home without success.  Patient was previously referred to a pulmonologist but decided to stop seeing this provider stating that he does not believe it is helping.  Patient likely needs further evaluation and management through his PCP once discharged.   4: CAD s/p CABG, PCI with DES different peripheral artery disease, Aortic Atherosclerosis:  Patient presented with epigastric pain/atypical cardiac chest pain with no EKG abnormalities and negative troponins x2.  He was continued on home dose of Plavix 75 mg daily.  Patient should follow-up with his cardiologist in the outpatient setting.     Discharge Vitals:   BP 115/80 (BP Location: Left Arm)   Pulse 73   Temp 98.7 F (37.1 C) (Oral)   Resp 18   Ht 5\' 7"  (1.702 m)   Wt 98.7 kg Comment: with Pant  on. Refused to remove  SpO2 91%   BMI 34.08 kg/m   Pertinent Labs, Studies, and Procedures:  CBC Latest Ref Rng & Units 08/04/2020 08/03/2020 01/30/2020  WBC 4.0 - 10.5 K/uL 10.7(H) 9.3 5.4  Hemoglobin 13.0 - 17.0 g/dL 03/31/2020 56.4 33.2  Hematocrit 39.0 - 52.0 % 41.7 44.4 40.6  Platelets 150 - 400 K/uL 256 289 259    CMP Latest Ref Rng & Units 08/04/2020 08/03/2020 01/30/2020  Glucose 70 - 99 mg/dL 03/31/2020) 884(Z) 90  BUN 8 - 23 mg/dL 19 22 16   Creatinine 0.61 - 1.24 mg/dL 660(Y) ) 3.01(S)  Sodium 135 - 145 mmol/L 140 141 139  Potassium 3.5 - 5.1  mmol/L 4.8 4.3 4.5  Chloride 98 - 111 mmol/L 106 106 108(H)  CO2 22 - 32 mmol/L 24 23 21   Calcium 8.9 - 10.3 mg/dL 8.9 9.2 8.9  Total Protein 6.5 - 8.1 g/dL 6.1(L) 7.1 6.4  Total Bilirubin 0.3 - 1.2 mg/dL 1.1 0.8 0.3  Alkaline Phos 38 - 126 U/L 42 54 60  AST 15 - 41 U/L 25 27 32  ALT 0 - 44 U/L 25 30 39    DG Chest 2 View  Result Date: 08/03/2020 CLINICAL DATA:  Chest pain. EXAM: CHEST - 2 VIEW COMPARISON:  November 13, 2016. FINDINGS: The heart size and mediastinal contours are within normal limits. Status post coronary bypass graft. No pneumothorax is noted. Stable bibasilar scarring or subsegmental atelectasis is noted. No pleural effusion is noted. The visualized skeletal structures are unremarkable. IMPRESSION: Stable bibasilar scarring or subsegmental  atelectasis. Electronically Signed   By: Lupita Raider M.D.   On: 08/03/2020 16:19   CT Angio Chest/Abd/Pel for Dissection W and/or Wo Contrast  Result Date: 08/03/2020 CLINICAL DATA:  Chest pain EXAM: CT ANGIOGRAPHY CHEST, ABDOMEN AND PELVIS TECHNIQUE: Non-contrast CT of the chest was initially obtained. Multidetector CT imaging through the chest, abdomen and pelvis was performed using the standard protocol during bolus administration of intravenous contrast. Multiplanar reconstructed images and MIPs were obtained and reviewed to evaluate the vascular anatomy. CONTRAST:  10mL OMNIPAQUE IOHEXOL 350 MG/ML SOLN COMPARISON:  CT chest dated 05/04/2019 FINDINGS: CTA CHEST FINDINGS Cardiovascular: There is no evidence for thoracic aortic dissection or aneurysm. There are atherosclerotic changes of the thoracic aorta. The arch vessels are patent where visualized. There are coronary artery calcifications. The heart size is unremarkable. There is no large centrally located pulmonary embolism. Mediastinum/Nodes: -- No mediastinal lymphadenopathy. -- No hilar lymphadenopathy. -- No axillary lymphadenopathy. -- No supraclavicular lymphadenopathy. -- Normal  thyroid gland where visualized. -  Unremarkable esophagus. Lungs/Pleura: There is chronic atelectasis at lung bases, left greater than right. There are moderate severe changes bilaterally. The trachea is unremarkable. There is no pneumothorax. Musculoskeletal: No chest wall abnormality. No bony spinal canal stenosis. Review of the MIP images confirms the above findings. CTA ABDOMEN AND PELVIS FINDINGS VASCULAR Aorta: There are advanced atherosclerotic changes throughout the abdominal aorta without evidence for an aneurysm. Celiac: Patent without evidence of aneurysm, dissection, vasculitis or significant stenosis. SMA: There is mild narrowing at the origin of the SMA. Renals: Both renal arteries are patent without evidence of aneurysm, dissection, vasculitis, fibromuscular dysplasia or significant stenosis. IMA: Patent without evidence of aneurysm, dissection, vasculitis or significant stenosis. 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 Veins: No obvious venous abnormality within the limitations of this arterial phase study. Review of the MIP images confirms the above findings. NON-VASCULAR Hepatobiliary: The liver is normal. There is fat stranding about the gallbladder. The gallbladder is mildly distended.There is no biliary ductal dilation. Pancreas: Normal contours without ductal dilatation. No peripancreatic fluid collection. Spleen: Unremarkable. Adrenals/Urinary Tract: --Adrenal glands: Unremarkable. --Right kidney/ureter: No hydronephrosis or radiopaque kidney stones. --Left kidney/ureter: No hydronephrosis or radiopaque kidney stones. --Urinary bladder: Unremarkable. Stomach/Bowel: --Stomach/Duodenum: No hiatal hernia or other gastric abnormality. Normal duodenal course and caliber. --Small bowel: Unremarkable. --Colon: Rectosigmoid diverticulosis without acute inflammation. --Appendix: Normal. Lymphatic: --No retroperitoneal lymphadenopathy. --No mesenteric  lymphadenopathy. --No pelvic or inguinal lymphadenopathy. Reproductive: Unremarkable Other: No ascites or free air. There is a fat containing umbilical hernia. Musculoskeletal. No acute displaced fractures. Review of the MIP images confirms the above findings. IMPRESSION: 1. No evidence for aortic dissection or aneurysm. 2. There is fat stranding about the gallbladder with mild distention. Further evaluation with ultrasound is recommended. 3. Rectosigmoid diverticulosis without acute inflammation. 4. 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 Aortic Atherosclerosis (ICD10-I70.0) and Emphysema (ICD10-J43.9). Electronically Signed   By: Katherine Mantle M.D.   On: 08/03/2020 19:47   US Abdomen Limited RUQ (LIVER/GB)  Result Date: 08/03/2020 CLINICAL DATA:  Upper abdominal pain. EXAM: ULTRASOUND ABDOMEN LIMITED RIGHT UPPER QUADRANT COMPARISON:  Chest and abdomen CT earlier today. FINDINGS: Gallbladder: Technically challenging and limited exam due to patient's anatomy and inability to lay flat in breath hold. Gallbladder distension. No gallstones or wall thickening visualized. There may be intraluminal sludge versus artifact. No sonographic Murphy sign noted by sonographer. Common bile duct: Diameter: 3-4 mm. Liver: Technically challenging exam  due to patient's anatomy with liver located under the hemidiaphragm. Left lobe is also poorly visualized. There is no evidence of focal lesion. Parenchymal echogenicity is normal. Portal vein is patent on color Doppler imaging with normal direction of blood flow towards the liver. Other: No right upper quadrant ascites. IMPRESSION: 1. Distended gallbladder with possible sludge versus artifact. No gallstones or sonographic findings of acute cholecystitis. Examination was technically difficult and limited due to patient's anatomy and difficulty tolerating the exam. If there is persistent clinical concern for acute cholecystitis, recommend nuclear  medicine hepatic biliary scan. 2. No biliary dilatation. Electronically Signed   By: Narda Rutherford M.D.   On: 08/03/2020 21:12     Discharge Instructions: Discharge Instructions     Diet - low sodium heart healthy   Complete by: As directed    Discharge instructions   Complete by: As directed    FOLLOW UP INSTRUCTIONS:  1. You were hospitalized for abdominal pain. Thank you for allowing [place] to be part of your care.    2. Please follow up with the following providers:  Primary care physician - please follow-up with your PCP to discuss COPD medications and a referral to gastroenterology to look for stomach ulcers.  3. Please note these changes made to your medications:   A. Medications to continue:  atorvastatin (LIPITOR) 20 MG tablet, TAKE 1 TABLET BY MOUTH DAILY ALTERNATINGWITH 2 TABLETS EVERY OTHER DAY AT BEDTIME (Patient taking differently: Take 20-40 mg by mouth See admin instructions. Take 20 mg by mouth alternating with 40 mg tablets every other day.) clopidogrel (PLAVIX) 75 MG tablet, TAKE 1 TABLET BY MOUTH DAILY fenofibrate (TRICOR) 48 MG tablet, Take 1 tablet (48 mg total) by mouth daily. levothyroxine (SYNTHROID) 75 MCG tablet, TAKE 1 TABLET BY MOUTH DAILY (Patient taking differently: Take 75 mcg by mouth daily.) metoprolol tartrate (LOPRESSOR) 25 MG tablet, TAKE 1 TABLET BY MOUTH TWICE DAILY (Patient taking differently: Take 25 mg by mouth 2 (two) times daily.) nitroGLYCERIN (NITROSTAT) 0.4 MG SL tablet, Place 1 tablet (0.4 mg total) under the tongue every 5 (five) minutes as needed. After 3 doses, if no relief dial 911 Vitamin D, Ergocalciferol, (DRISDOL) 1.25 MG (50000 UNIT) CAPS capsule, TAKE 1 CAPSULE BY MOUTH EVERY 7 DAYS (Patient taking differently: Take 50,000 Units by mouth every 7 (seven) days.)  B. Medications to start:  pantoprazole (PROTONIX) EC tablet 40 mg, Oral, twice daily for 4 -8 week  C. Medications to discontinue:   Nonsteroidal anti-inflammatory  medications such as ibuprofen and Motrin.  Please make sure to follow-up with your primary care provider and take your acid reducing medication, Protonix.  Please call our clinic if you have any questions or concerns, we may be able to help and keep you from a long and expensive emergency room wait. Our clinic and after hours phone number is [phone], the best time to call is Monday through Friday 9 am to 4 pm but there is always someone available 24/7 if you have an emergency. If you need medication refills please notify your pharmacy one week in advance and they will send [place] a request.   Increase activity slowly   Complete by: As directed        Signed: Steffanie Rainwater, MD 08/04/2020, 2:59 PM   Pager: [phone]

## 2020-08-04 NOTE — Progress Notes (Signed)
Pt's pharmacy is closed on sundays. Pt wont be able to pick up Rx, will need protonix for tonight dose. FMTS paged, waiting for call back.

## 2020-08-04 NOTE — Discharge Instructions (Signed)

## 2020-08-06 ENCOUNTER — Other Ambulatory Visit: Payer: Self-pay | Admitting: Physician Assistant

## 2020-08-06 DIAGNOSIS — E559 Vitamin D deficiency, unspecified: Secondary | ICD-10-CM

## 2020-08-06 MED ORDER — VITAMIN D (ERGOCALCIFEROL) 1.25 MG (50000 UNIT) PO CAPS: ORAL_CAPSULE | ORAL | 3 refills | Status: DC

## 2020-08-07 ENCOUNTER — Other Ambulatory Visit: Payer: Self-pay

## 2020-08-07 ENCOUNTER — Ambulatory Visit (INDEPENDENT_AMBULATORY_CARE_PROVIDER_SITE_OTHER): Payer: Medicare HMO | Admitting: Physician Assistant

## 2020-08-07 ENCOUNTER — Encounter: Payer: Self-pay | Admitting: Physician Assistant

## 2020-08-07 VITALS — BP 144/76 | HR 60 | Temp 97.4°F | Resp 22 | Ht 67.0 in | Wt 219.4 lb

## 2020-08-07 DIAGNOSIS — J449 Chronic obstructive pulmonary disease, unspecified: Secondary | ICD-10-CM

## 2020-08-07 DIAGNOSIS — N1831 Chronic kidney disease, stage 3a: Secondary | ICD-10-CM

## 2020-08-07 DIAGNOSIS — Z09 Encounter for follow-up examination after completed treatment for conditions other than malignant neoplasm: Secondary | ICD-10-CM

## 2020-08-07 DIAGNOSIS — R1013 Epigastric pain: Secondary | ICD-10-CM

## 2020-08-07 MED ORDER — PANTOPRAZOLE SODIUM 40 MG PO TBEC: 40.0000 mg | DELAYED_RELEASE_TABLET | Freq: Every day | ORAL | 1 refills | Status: DC

## 2020-08-07 NOTE — Progress Notes (Signed)
Established Patient Office Visit  Subjective:  Patient ID: Michael [name], male    DOB: September 17, 1945  Age: 74 y.o. MRN: [id number]  CC:  Chief Complaint  Patient presents with  . Hospitalization Follow-up    HPI Michael [name] presents for hospital follow-up : Epigastric pain, AKI on CKD stage III, COPD Gold II and chronic respiratory failure  Ja reports he has been doing well at home since discharge. He lives at home alone. He is eating and drinking normally. He had a normal BM this morning. Reports he has not had any abdominal/epigastric pain x 4 days (since Saturday evening when he was hospitalized). He denies melena/hematochezia. He started taking protonix 40 mg last night and took one dose this morning; reports difficulty obtaining the prescription from the pharmacy since he was discharge on a Sunday. He also states he read the package insert and he is not keen about taking this medication.  Reports he feels like today is a wasted visit because he is not interested in having an endoscopy and does not believe he has ulcers. He reports he gets occasional indigestion, but he denies unintended weight loss, abd pain, forceful vomiting, dysphagia/odynophagia, change in bowel habits.  Reports he is on home oxygen 4L. He does not use portable oxygen. He does not wear it to sleep because he states "I don't need it." Reports he smoked cigarettes for 50 years. States he has been prescribed inhalers in the past, but they were not helpful because he didn't feel like he could inhale the medication. He was last evaluated by Pulm on 03/16/19. SpO2 dropped to 87% with ambulation and it was documented that he refused to wear his oxygen. He was a candidate for LABA/LAMA and prescribed Bevespi ( Glycopyrrolate-Formoterol 9-4.8 mcg 2 puffs twice a day). He has not taken this in years.    Past Medical History:  Diagnosis Date  . Arthritis    "right hip; right wrist" (11/24/2016)  . CAD, multiple vessel 05/2011    90% mid LAD with diffuse proximal stenosis, 100% proximal RCA --> s/p CABG x 2 (LIMA-LAD, SVG-dRCA); b) Lexiscan Myoview 09/2016 w/ Inferior-Inferolateral defect -> Cath:  11/24/16: 100% CTO of SVG-RCA (but nagtive /RCA only subtotal occlusion) --> PCI with DES x2 (resolute onyx DES 2.25 mm x 30 mm, 2.5 mm x 20 mm) RCA  . Carotid artery occlusion   . COPD (chronic obstructive pulmonary disease) with emphysema ([place])   . Essential hypertension   . History of gout   . History of tobacco abuse    quit 05/2011  . Hyperlipidemia with target LDL less than 70   . Hypothyroidism   . MI (myocardial infarction) ([place]) 05/2011   "I had 3 in 2 days"  . NSTEMI (non-ST elevated myocardial infarction) ([place]) 06/05/2011   With CHF - probably late presentation for missed STEMI --> CATH- MV CAD;; Echo 07/2001: EF > 55%, mild MAC, mild TR, no WMA  with Post-op Setpal dyskinesis, [place] not seen)  . PAD (peripheral artery disease) ([place]) 06/08/2011   a) R Common Iliac 100%, L Common Iliac 80+% occlusion; b) L CIA - Aterectomy- 10 mm x 40 mm self-extending Stent (postdilated to 7 mm with 20-30% residual)--> Deferred R AoFem bypass;; c) LEA Dopplers 04/2012 & 04/2014 - RABI 0.8 & LABI 1.1 - STABLE, no change  . Pneumonia 1940s X 1  . Presence of drug coated stent in right coronary artery 11/24/2016   p-mRCA: Resolute Onyx DES 2.25 mm x 38 mm -->  2.5 mm x 18 mm (post-dilated from 2.7-2.8 mm)    Past Surgical History:  Procedure Laterality Date  . CORONARY ANGIOPLASTY WITH STENT PLACEMENT  11/24/2016   with 100% SVG-RCA & flow in native RCA now present --> PCI-p-m RCA 2 Overlapping Resolute DES (2.25 x 38, 1.5 x 18)  . CORONARY ARTERY BYPASS GRAFT  06/10/2011   oLIMA to LAD, SVG to PDA (Dr. Servando Snare)  . CORONARY STENT INTERVENTION N/A 11/24/2016   Procedure: Coronary Stent Intervention;  Surgeon: Leonie Man, MD;  Location: [facility] INVASIVE CV LAB: [place] 2.25X38 & overlapping DES RESOLUTE ONYX 2.5X18   . ILIAC  ARTERY STENT  10/27/2011   (Dr. Ellyn Hack Dr. Gwenlyn Found) left CIA - 64mx40mm self-expanding stent, decreasing downt to 20-30% lesion, 100% occluded R CIA with extensive lumbar collateralization to R internal iliac;  05/2016: Ao-Ileac & LEA Dopplers: RABI 0.8, LABI 1.2. B TBI 0.77 -- he has known B disease s/p L CIA stent. RLABI decreased and LABI is normal; aortoiliac atherosclerosis w/ CTO of R CIA but normal REIA.  .Marland KitchenLEFT HEART CATH AND CORONARY ANGIOGRAPHY  06/09/2011   90% mid LAD with diffuse proximal stenosis, 100% proximal RCA --> s/p CABG x 2 (LIMA-LAD, SVG-dRCA);  .Marland KitchenLEFT HEART CATH AND CORS/GRAFTS ANGIOGRAPHY N/A 11/24/2016   Procedure: LEFT HEART CATH AND CORS/GRAFTS ANGIOGRAPHY;  Surgeon: DLeonie Man MD;  Location: [facility] INVASIVE CV LAB: 100% SVG-RCA, but now native RCA only sub-CTO with antegrade flow.  85% mLAD after D1. 40% oOM1 --> PCI of Native RCA  . NM MYOVIEW LTD  09/2016    EF 62%. Medium sized, moderate severity defect in the basal inferior, mid inferoseptal and mid inferior walls is partially reversible. - INTERMEDIATE RISK -- CATH  . PERCUTANEOUS STENT INTERVENTION Left 10/27/2011   Procedure: PERCUTANEOUS STENT INTERVENTION;  Surgeon: JLorretta Harp MD / DLeonie Man MD;  [facility];  Service: Cardiovascular;  Laterality: Left;  . TONSILLECTOMY  ~ 1963  . TRANSTHORACIC ECHOCARDIOGRAM  08/19/2011   EF=>55%; mild mitral annular calcif; mild TR    Family History  Problem Relation Age of Onset  . Heart disease Mother        valve replacement  . Diabetes Mother   . Heart failure Mother   . CAD Sister        cardiac stent  . Stroke Sister        stents in neck  . Pneumonia Father     Social History   Socioeconomic History  . Marital status: Divorced    Spouse name: Not on file  . Number of children: 3  . Years of education: Not on file  . Highest education level: Not on file  Occupational History  . Not on file  Tobacco Use  . Smoking status:  Former Smoker    Packs/day: 2.00    Years: 51.50    Pack years: 103.00    Types: Cigarettes    Quit date: 06/08/2011    Years since quitting: 9.1  . Smokeless tobacco: Former [employer] developer   Types: Chew    Quit date: 07/13/2003  Vaping Use  . Vaping Use: Never used  Substance and Sexual Activity  . Alcohol use: No    Comment: 11/24/2016 "nothing in 2 1/2 years; never an alcoholic"  . Drug use: No  . Sexual activity: Never  Other Topics Concern  . Not on file  Social History Narrative  . Not on file  Social Determinants of Health   Financial Resource Strain: Not on file  Food Insecurity: Not on file  Transportation Needs: Not on file  Physical Activity: Not on file  Stress: Not on file  Social Connections: Not on file  Intimate Partner Violence: Not on file    Outpatient Medications Prior to Visit  Medication Sig Dispense Refill  . atorvastatin (LIPITOR) 20 MG tablet TAKE 1 TABLET BY MOUTH DAILY ALTERNATINGWITH 2 TABLETS EVERY OTHER DAY AT BEDTIME (Patient taking differently: Take 20-40 mg by mouth See admin instructions. Take 20 mg by mouth alternating with 40 mg tablets every other day.) 180 tablet 3  . clopidogrel (PLAVIX) 75 MG tablet TAKE 1 TABLET BY MOUTH DAILY 90 tablet 3  . fenofibrate (TRICOR) 48 MG tablet Take 1 tablet (48 mg total) by mouth daily. 90 tablet 3  . levothyroxine (SYNTHROID) 75 MCG tablet TAKE 1 TABLET BY MOUTH DAILY (Patient taking differently: Take 75 mcg by mouth daily.) 90 tablet 1  . metoprolol tartrate (LOPRESSOR) 25 MG tablet TAKE 1 TABLET BY MOUTH TWICE DAILY (Patient taking differently: Take 25 mg by mouth 2 (two) times daily.) 180 tablet 3  . nitroGLYCERIN (NITROSTAT) 0.4 MG SL tablet Place 1 tablet (0.4 mg total) under the tongue every 5 (five) minutes as needed. After 3 doses, if no relief dial 911 30 tablet 0  . Vitamin D, Ergocalciferol, (DRISDOL) 1.25 MG (50000 UNIT) CAPS capsule TAKE 1 CAPSULE BY MOUTH EVERY 7 DAYS 12 capsule 3  . pantoprazole  (PROTONIX) 40 MG tablet Take 1 tablet (40 mg total) by mouth 2 (two) times daily. 60 tablet 1   No facility-administered medications prior to visit.    Allergies  Allergen Reactions  . Ace Inhibitors Cough    Pt reports he is not allergic to this 08/03/2020    ROS Review of Systems    Objective:    Physical Exam Constitutional:      General: He is awake.     Appearance: Normal appearance. He is not ill-appearing or toxic-appearing.  HENT:     Mouth/Throat:     Mouth: Mucous membranes are moist.     Dentition: Has dentures.     Tongue: No lesions. Tongue does not deviate from midline.     Palate: No lesions.     Pharynx: Oropharynx is clear.  Cardiovascular:     Rate and Rhythm: Normal rate and regular rhythm.     Heart sounds: S1 normal and S2 normal.  Pulmonary:     Effort: Tachypnea present.     Breath sounds: Decreased air movement present. No wheezing, rhonchi or rales.  Musculoskeletal:     Right lower leg: 3+ Pitting Edema present.     Left lower leg: 2+ Edema present.  Lymphadenopathy:     Cervical: No cervical adenopathy.  Skin:    General: Skin is warm.     Capillary Refill: Capillary refill takes more than 3 seconds.     Coloration: Skin is not cyanotic.     Nails: There is clubbing.  Neurological:     Mental Status: He is alert.  Psychiatric:        Behavior: Behavior is cooperative.     BP (!) 144/76   Pulse 60   Temp (!) 97.4 F (36.3 C) (Oral)   Ht _0  (1.702 m)   Wt 219 lb 6.4 oz (99.5 kg)   SpO2 97% Comment: on RA  BMI 34.36 kg/m  Wt Readings from Last 3 Encounters:  08/07/20 219 lb 6.4 oz (99.5 kg)  08/04/20 217 lb 9.6 oz (98.7 kg)  06/14/20 216 lb 3.2 oz (98.1 kg)     Health Maintenance Due  Topic Date Due  . COVID-19 Vaccine (1) Never done  . TETANUS/TDAP  Never done  . PNA vac Low Risk Adult (1 of 2 - PCV13) Never done  . INFLUENZA VACCINE  Never done    There are no preventive care reminders to display for this  patient.  Lab Results  Component Value Date   TSH 2.126 08/04/2020   Lab Results  Component Value Date   WBC 10.7 (H) 08/04/2020   HGB 13.5 08/04/2020   HCT 41.7 08/04/2020   MCV 95.9 08/04/2020   PLT 256 08/04/2020   Lab Results  Component Value Date   NA 140 08/04/2020   K 4.8 08/04/2020   CO2 24 08/04/2020   GLUCOSE 104 (H) 08/04/2020   BUN 19 08/04/2020   CREATININE 1.48 (H) 08/04/2020   BILITOT 1.1 08/04/2020   ALKPHOS 42 08/04/2020   AST 25 08/04/2020   ALT 25 08/04/2020   PROT 6.1 (L) 08/04/2020   ALBUMIN 3.1 (L) 08/04/2020   CALCIUM 8.9 08/04/2020   ANIONGAP 10 08/04/2020   Lab Results  Component Value Date   CHOL 132 01/30/2020   Lab Results  Component Value Date   HDL 34 (L) 01/30/2020   Lab Results  Component Value Date   LDLCALC 79 01/30/2020   Lab Results  Component Value Date   TRIG 103 01/30/2020   Lab Results  Component Value Date   CHOLHDL 3.9 01/30/2020   Lab Results  Component Value Date   HGBA1C 5.8 (H) 08/04/2020      Assessment & Plan:   Problem List Items Addressed This Visit      Respiratory   COPD  GOLD II with reversibility  (Chronic)   Relevant Medications   Tiotropium Bromide-Olodaterol (STIOLTO RESPIMAT) 2.5-2.5 MCG/ACT AERS     Genitourinary   Chronic kidney disease, stage 3, mod decreased GFR (HCC) (Chronic)     Other   Abdominal pain   Relevant Orders   Ambulatory referral to Gastroenterology    Other Visit Diagnoses    Hospital discharge follow-up    -  Primary      Meds ordered this encounter  Medications  . pantoprazole (PROTONIX) 40 MG tablet    Sig: Take 1 tablet (40 mg total) by mouth daily.    Dispense:  30 tablet    Refill:  1    Order Specific Question:   Supervising Provider    Answer:   Emeterio Reeve [id number]  . Tiotropium Bromide-Olodaterol (STIOLTO RESPIMAT) 2.5-2.5 MCG/ACT AERS    Sig: Inhale 2 puffs into the lungs daily.    Dispense:  4 g    Refill:  11    Order Specific  Question:   Supervising Provider    Answer:   Emeterio Reeve [[id number]]   1. History of epigastric pain Referral to GI - recommend he discuss alternatives to EGD including capsule endoscopy Reduce dose of Protonix to 40 mg once daily  - high dose PPI therapy is risky in this 74 YO with CKD and there is no objective evidence of PUD Repeat CBC/CMP today - patient reports he is a tough stick and he will only let phlebotomist try once - after 1 unsuccessful attempt of venipuncture, I advised him to return another day this week after adequately hydrating   2. COPD Gold  II Not currently treated apart from smoking cessation Has tried Bevespi, but had difficulty with MDI Will try to get Stiolto approved since aerosolized medication may be easier to inhale Discussed this may require a PA and be cost-prohiitive If he is able to fill, recommend he schedule appt for inhaler administration education  3. Chronic respiratory failure Pulse ox 97% on RA at rest, mild tachypnea at 22  Continue to wear home O2 4L/min - encouraged to wear at night  Follow-up: Return in about 6 weeks (around 09/18/2020) for follow-up COPD, CKD.    Trixie Dredge, [place]

## 2020-08-07 NOTE — Patient Instructions (Addendum)
Reduce your dose of Pantoprazole (antacid) to 40 mg once daily  Talk to the Gastroenterologist about whether you can do a capsule endoscopy  Talk to the [facility] about how long you should continue your antacid medication (Pantoprazole)  **COPD  Start wearing your oxygen to bed at night   I will look up an inhaler that could be covered by your insurance. It might still be very expensive. If you are able to afford the inhaler, I would recommend bringing it in to the office so we can help instruct you on how to get the medication in your lungs

## 2020-08-11 MED ORDER — STIOLTO RESPIMAT 2.5-2.5 MCG/ACT IN AERS: 2.0000 | INHALATION_SPRAY | Freq: Every day | RESPIRATORY_TRACT | 11 refills | Status: DC

## 2020-08-20 ENCOUNTER — Other Ambulatory Visit: Payer: Self-pay | Admitting: Cardiology

## 2020-09-23 ENCOUNTER — Encounter: Payer: Self-pay | Admitting: Physician Assistant

## 2020-09-23 ENCOUNTER — Other Ambulatory Visit: Payer: Self-pay

## 2020-09-23 ENCOUNTER — Ambulatory Visit: Payer: Medicare HMO | Admitting: Physician Assistant

## 2020-09-23 ENCOUNTER — Ambulatory Visit (INDEPENDENT_AMBULATORY_CARE_PROVIDER_SITE_OTHER): Payer: Medicare HMO | Admitting: Physician Assistant

## 2020-09-23 VITALS — BP 106/66 | HR 86 | Ht 67.0 in | Wt 213.8 lb

## 2020-09-23 DIAGNOSIS — J449 Chronic obstructive pulmonary disease, unspecified: Secondary | ICD-10-CM | POA: Diagnosis not present

## 2020-09-23 DIAGNOSIS — I10 Essential (primary) hypertension: Secondary | ICD-10-CM | POA: Diagnosis not present

## 2020-09-23 DIAGNOSIS — N1831 Chronic kidney disease, stage 3a: Secondary | ICD-10-CM

## 2020-09-23 DIAGNOSIS — R7303 Prediabetes: Secondary | ICD-10-CM | POA: Diagnosis not present

## 2020-09-23 DIAGNOSIS — E039 Hypothyroidism, unspecified: Secondary | ICD-10-CM | POA: Diagnosis not present

## 2020-09-23 NOTE — Progress Notes (Signed)
Established Patient Office Visit  Subjective:  Patient ID: Michael [name], male    DOB: May 06, 1946  Age: 75 y.o. MRN: [id number]  CC:  Chief Complaint  Patient presents with  . Hypertension  . Prediabetes    HPI [name] presents for follow up on hypertension and prediabetes.  Reports cancelled GI referral. He took some Maalox and since then has not had recurrent abdominal pain.  Reports has also decreased greasy and fried foods which have helped.  HTN: Pt denies chest pain, palpitations, dizziness or headache.  States lower extremity edema has been stable.  Taking medication as directed without side effects.  Does not check blood pressure at home. Pt follows a low salt diet.  Only uses salt with cantaloupe or tomato sandwiches.  COPD: Has seen pulmonology in the past and is not interested on seeing pulmonology again.  Has been using oxygen more consistently, but not at bedtime.  States in the past has tried inhaler (Trelegy) which caused thrush and does not want to try again.  At last office visit Stioloto was prescribed and reports he did not pick up prescription.  Inhalers are expensive and does not have the money to afford them and does not feel like they are helpful at all.  States is aware COPD is going to worsen over time. Can notice becoming more short of breath with exertion.   Prediabetes: Denies increased thirst or urination from baseline.   Hypothyroid: Reports medication compliance. Denies fatigue, heat/cold intolerance, bowel or weight changes.  Past Medical History:  Diagnosis Date  . Arthritis    "right hip; right wrist" (11/24/2016)  . CAD, multiple vessel 05/2011   90% mid LAD with diffuse proximal stenosis, 100% proximal RCA --> s/p CABG x 2 (LIMA-LAD, SVG-dRCA); b) Lexiscan Myoview 09/2016 w/ Inferior-Inferolateral defect -> Cath:  11/24/16: 100% CTO of SVG-RCA (but nagtive /RCA only subtotal occlusion) --> PCI with DES x2 (resolute onyx DES 2.25 mm x 30 mm, 2.5 mm x 20  mm) RCA  . Carotid artery occlusion   . COPD (chronic obstructive pulmonary disease) with emphysema ([place])   . Essential hypertension   . History of gout   . History of tobacco abuse    quit 05/2011  . Hyperlipidemia with target LDL less than 70   . Hypothyroidism   . MI (myocardial infarction) ([place]) 05/2011   "I had 3 in 2 days"  . NSTEMI (non-ST elevated myocardial infarction) ([place]) 06/05/2011   With CHF - probably late presentation for missed STEMI --> CATH- MV CAD;; Echo 07/2001: EF > 55%, mild MAC, mild TR, no WMA  with Post-op Setpal dyskinesis, [place] not seen)  . PAD (peripheral artery disease) ([place]) 06/08/2011   a) R Common Iliac 100%, L Common Iliac 80+% occlusion; b) L CIA - Aterectomy- 10 mm x 40 mm self-extending Stent (postdilated to 7 mm with 20-30% residual)--> Deferred R AoFem bypass;; c) LEA Dopplers 04/2012 & 04/2014 - RABI 0.8 & LABI 1.1 - STABLE, no change  . Pneumonia 1940s X 1  . Presence of drug coated stent in right coronary artery 11/24/2016   p-mRCA: Resolute Onyx DES 2.25 mm x 38 mm --> 2.5 mm x 18 mm (post-dilated from 2.7-2.8 mm)    Past Surgical History:  Procedure Laterality Date  . CORONARY ANGIOPLASTY WITH STENT PLACEMENT  11/24/2016   with 100% SVG-RCA & flow in native RCA now present --> PCI-p-m RCA 2 Overlapping Resolute DES (2.25 x 38, 1.5 x 18)  .  CORONARY ARTERY BYPASS GRAFT  06/10/2011   oLIMA to LAD, SVG to PDA (Dr. Servando Snare)  . CORONARY STENT INTERVENTION N/A 11/24/2016   Procedure: Coronary Stent Intervention;  Surgeon: Leonie Man, MD;  Location[facility] INVASIVE CV LAB: [place] 2.25X38 & overlapping DES RESOLUTE ONYX 2.5X18   . ILIAC ARTERY STENT  10/27/2011   (Dr. Ellyn Hack Dr. Gwenlyn Found) left CIA - 16mx40mm self-expanding stent, decreasing downt to 20-30% lesion, 100% occluded R CIA with extensive lumbar collateralization to R internal iliac;  05/2016: Ao-Ileac & LEA Dopplers: RABI 0.8, LABI 1.2. B TBI 0.77 -- he has known B disease s/p L CIA  stent. RLABI decreased and LABI is normal; aortoiliac atherosclerosis w/ CTO of R CIA but normal REIA.  .Marland KitchenLEFT HEART CATH AND CORONARY ANGIOGRAPHY  06/09/2011   90% mid LAD with diffuse proximal stenosis, 100% proximal RCA --> s/p CABG x 2 (LIMA-LAD, SVG-dRCA);  .Marland KitchenLEFT HEART CATH AND CORS/GRAFTS ANGIOGRAPHY N/A 11/24/2016   Procedure: LEFT HEART CATH AND CORS/GRAFTS ANGIOGRAPHY;  Surgeon: DLeonie Man MD;  Location: [facility] INVASIVE CV LAB: 100% SVG-RCA, but now native RCA only sub-CTO with antegrade flow.  85% mLAD after D1. 40% oOM1 --> PCI of Native RCA  . NM MYOVIEW LTD  09/2016    EF 62%. Medium sized, moderate severity defect in the basal inferior, mid inferoseptal and mid inferior walls is partially reversible. - INTERMEDIATE RISK -- CATH  . PERCUTANEOUS STENT INTERVENTION Left 10/27/2011   Procedure: PERCUTANEOUS STENT INTERVENTION;  Surgeon: JLorretta Harp MD / DLeonie Man MD;  [facility];  Service: Cardiovascular;  Laterality: Left;  . TONSILLECTOMY  ~ 1963  . TRANSTHORACIC ECHOCARDIOGRAM  08/19/2011   EF=>55%; mild mitral annular calcif; mild TR    Family History  Problem Relation Age of Onset  . Heart disease Mother        valve replacement  . Diabetes Mother   . Heart failure Mother   . CAD Sister        cardiac stent  . Stroke Sister        stents in neck  . Pneumonia Father     Social History   Socioeconomic History  . Marital status: Divorced    Spouse name: Not on file  . Number of children: 3  . Years of education: Not on file  . Highest education level: Not on file  Occupational History  . Not on file  Tobacco Use  . Smoking status: Former Smoker    Packs/day: 2.00    Years: 51.50    Pack years: 103.00    Types: Cigarettes    Quit date: 06/08/2011    Years since quitting: 9.3  . Smokeless tobacco: Former [employer] developer   Types: Chew    Quit date: 07/13/2003  Vaping Use  . Vaping Use: Never used  Substance and Sexual Activity  . Alcohol use:  No    Comment: 11/24/2016 "nothing in 2 1/2 years; never an alcoholic"  . Drug use: No  . Sexual activity: Never  Other Topics Concern  . Not on file  Social History Narrative  . Not on file   Social Determinants of Health   Financial Resource Strain: Not on file  Food Insecurity: Not on file  Transportation Needs: Not on file  Physical Activity: Not on file  Stress: Not on file  Social Connections: Not on file  Intimate Partner Violence: Not on file    Outpatient Medications Prior to  Visit  Medication Sig Dispense Refill  . atorvastatin (LIPITOR) 20 MG tablet TAKE 1 TABLET BY MOUTH DAILY ALTERNATINGWITH 2 TABLETS EVERY OTHER DAY AT BEDTIME (Patient taking differently: Take 20-40 mg by mouth See admin instructions. Take 20 mg by mouth alternating with 40 mg tablets every other day.) 180 tablet 3  . clopidogrel (PLAVIX) 75 MG tablet TAKE 1 TABLET BY MOUTH DAILY 90 tablet 3  . fenofibrate (TRICOR) 48 MG tablet TAKE 1 TABLET BY MOUTH DAILY 90 tablet 3  . levothyroxine (SYNTHROID) 75 MCG tablet TAKE 1 TABLET BY MOUTH DAILY (Patient taking differently: Take 75 mcg by mouth daily.) 90 tablet 1  . metoprolol tartrate (LOPRESSOR) 25 MG tablet TAKE 1 TABLET BY MOUTH TWICE DAILY (Patient taking differently: Take 25 mg by mouth 2 (two) times daily.) 180 tablet 3  . nitroGLYCERIN (NITROSTAT) 0.4 MG SL tablet Place 1 tablet (0.4 mg total) under the tongue every 5 (five) minutes as needed. After 3 doses, if no relief dial 911 30 tablet 0  . Vitamin D, Ergocalciferol, (DRISDOL) 1.25 MG (50000 UNIT) CAPS capsule TAKE 1 CAPSULE BY MOUTH EVERY 7 DAYS 12 capsule 3  . pantoprazole (PROTONIX) 40 MG tablet Take 1 tablet (40 mg total) by mouth daily. (Patient not taking: Reported on 09/23/2020) 30 tablet 1  . Tiotropium Bromide-Olodaterol (STIOLTO RESPIMAT) 2.5-2.5 MCG/ACT AERS Inhale 2 puffs into the lungs daily. (Patient not taking: Reported on 09/23/2020) 4 g 11   No facility-administered medications prior  to visit.    Allergies  Allergen Reactions  . Ace Inhibitors Cough    Pt reports he is not allergic to this 08/03/2020    ROS Review of Systems A fourteen system review of systems was performed and found to be positive as per HPI.   Objective:    Physical Exam General:  Cooperative, in no acute distress, non-toxic appearing. Has portable oxygen. Neuro:  Alert and oriented,  extra-ocular muscles intact  HEENT:  Normocephalic, atraumatic, neck supple Skin:  no gross rash, warm, pink. Cardiac:  RRR, S1 S2 wnl's, w/o murmur  Respiratory:  Scattered rhonchi with decreased breath sounds. No wheezing, crackles or rales. Vascular:  Ext warm, no cyanosis apprec.;Trace edema Psych:  No HI/SI, judgement and insight baseline,  Normal behavior.   BP 106/66   Pulse 86   Ht _0  (1.702 m)   Wt 213 lb 12.8 oz (97 kg)   SpO2 (!) 89%   BMI 33.49 kg/m  Wt Readings from Last 3 Encounters:  09/23/20 213 lb 12.8 oz (97 kg)  08/07/20 219 lb 6.4 oz (99.5 kg)  08/04/20 217 lb 9.6 oz (98.7 kg)     Health Maintenance Due  Topic Date Due  . COVID-19 Vaccine (1) Never done  . TETANUS/TDAP  Never done  . PNA vac Low Risk Adult (1 of 2 - PCV13) Never done  . INFLUENZA VACCINE  Never done    There are no preventive care reminders to display for this patient.  Lab Results  Component Value Date   TSH 2.126 08/04/2020   Lab Results  Component Value Date   WBC 10.7 (H) 08/04/2020   HGB 13.5 08/04/2020   HCT 41.7 08/04/2020   MCV 95.9 08/04/2020   PLT 256 08/04/2020   Lab Results  Component Value Date   NA 140 08/04/2020   K 4.8 08/04/2020   CO2 24 08/04/2020   GLUCOSE 104 (H) 08/04/2020   BUN 19 08/04/2020   CREATININE 1.48 (H) 08/04/2020  BILITOT 1.1 08/04/2020   ALKPHOS 42 08/04/2020   AST 25 08/04/2020   ALT 25 08/04/2020   PROT 6.1 (L) 08/04/2020   ALBUMIN 3.1 (L) 08/04/2020   CALCIUM 8.9 08/04/2020   ANIONGAP 10 08/04/2020   Lab Results  Component Value Date   CHOL  132 01/30/2020   Lab Results  Component Value Date   HDL 34 (L) 01/30/2020   Lab Results  Component Value Date   LDLCALC 79 01/30/2020   Lab Results  Component Value Date   TRIG 103 01/30/2020   Lab Results  Component Value Date   CHOLHDL 3.9 01/30/2020   Lab Results  Component Value Date   HGBA1C 5.8 (H) 08/04/2020      Assessment & Plan:   Problem List Items Addressed This Visit      Cardiovascular and Mediastinum   Essential hypertension - Primary (Chronic)     Respiratory   COPD  GOLD II with reversibility  (Chronic)     Endocrine   Hypothyroidism     Genitourinary   Chronic kidney disease, stage 3, mod decreased GFR (HCC) (Chronic)     Other   Pre-diabetes     Essential hypertension: -Controlled. -Followed by cardiology. On metoprolol. -Recommend to continue low-sodium diet. -Will continue to monitor.  COPD Gold 2 with reversibility: -Patient declines treatment and/or referral to pulmonology. -SPO2 on room air 89% with 4L.  Recommend to continue supplemental oxygen and should also use it at night. -Reviewed prior Pulmonology consult. Pt tried [employer]. Unable to locate record of Trelegy trial.  Hypothyroidism: -Last TSH 2.126, wnl -Continue current medication regimen. -Will continue to monitor.  Chronic kidney disease, stage III, moderate decreased GFR: -Last CMP: Cr 1.48, GFR 49 -Patient reports he is a difficult blood draw and declines getting labs today. Advised will defer blood work until next OV and recommend to be hydrated. -Recommend to avoid NSAIDs.  Prediabetes: -Last A1c 5.8, stable. -Monitor carbohydrates/glucose. -Will continue to monitor.  No orders of the defined types were placed in this encounter.   Follow-up: Return in about 4 months (around 01/21/2021) for [facility] with [facility] few days before .   Note:  This note was prepared with assistance of Dragon voice recognition software. Occasional wrong-word or sound-a-like substitutions  may have occurred due to the inherent limitations of voice recognition software.   Lorrene Reid, PA-C

## 2020-09-23 NOTE — Patient Instructions (Signed)

## 2020-11-04 ENCOUNTER — Telehealth: Payer: Self-pay | Admitting: Physician Assistant

## 2020-11-04 NOTE — Telephone Encounter (Signed)
Patient is purchasing portable oxygen and needs the form that was faxed sent back. Eunice Blase states it was faxed over on Friday. She is calling with [employer]. Patient is already on oxygen and will be purchasing this out of pocket. Thanks

## 2020-11-05 NOTE — Telephone Encounter (Signed)
Form was completed last week and was faxed earlier today.  Thank you, Kandis Cocking

## 2020-11-06 NOTE — Telephone Encounter (Signed)
[employer] notified that the form was completed and faxed over.

## 2020-11-27 ENCOUNTER — Other Ambulatory Visit: Payer: Self-pay | Admitting: Physician Assistant

## 2020-11-27 DIAGNOSIS — E039 Hypothyroidism, unspecified: Secondary | ICD-10-CM

## 2021-01-21 ENCOUNTER — Other Ambulatory Visit: Payer: Self-pay

## 2021-01-21 ENCOUNTER — Ambulatory Visit (INDEPENDENT_AMBULATORY_CARE_PROVIDER_SITE_OTHER): Payer: Medicare HMO | Admitting: Physician Assistant

## 2021-01-21 ENCOUNTER — Encounter: Payer: Self-pay | Admitting: Physician Assistant

## 2021-01-21 VITALS — BP 103/67 | HR 87 | Temp 97.4°F | Ht 67.0 in | Wt 208.4 lb

## 2021-01-21 DIAGNOSIS — Z122 Encounter for screening for malignant neoplasm of respiratory organs: Secondary | ICD-10-CM

## 2021-01-21 DIAGNOSIS — I10 Essential (primary) hypertension: Secondary | ICD-10-CM | POA: Diagnosis not present

## 2021-01-21 DIAGNOSIS — E039 Hypothyroidism, unspecified: Secondary | ICD-10-CM | POA: Diagnosis not present

## 2021-01-21 DIAGNOSIS — E785 Hyperlipidemia, unspecified: Secondary | ICD-10-CM | POA: Diagnosis not present

## 2021-01-21 DIAGNOSIS — Z1211 Encounter for screening for malignant neoplasm of colon: Secondary | ICD-10-CM

## 2021-01-21 DIAGNOSIS — N1831 Chronic kidney disease, stage 3a: Secondary | ICD-10-CM

## 2021-01-21 DIAGNOSIS — R7303 Prediabetes: Secondary | ICD-10-CM | POA: Diagnosis not present

## 2021-01-21 DIAGNOSIS — Z87891 Personal history of nicotine dependence: Secondary | ICD-10-CM

## 2021-01-21 DIAGNOSIS — Z0000 Encounter for general adult medical examination without abnormal findings: Secondary | ICD-10-CM | POA: Diagnosis not present

## 2021-01-21 DIAGNOSIS — E559 Vitamin D deficiency, unspecified: Secondary | ICD-10-CM | POA: Diagnosis not present

## 2021-01-21 NOTE — Patient Instructions (Signed)
Preventive Care 77 Years and Older, Male Preventive care refers to lifestyle choices and visits with your health care provider that can promote health and wellness. This includes:  A yearly physical exam. This is also called an annual wellness visit.  Regular dental and eye exams.  Immunizations.  Screening for certain conditions.  Healthy lifestyle choices, such as: ? Eating a healthy diet. ? Getting regular exercise. ? Not using drugs or products that contain nicotine and tobacco. ? Limiting alcohol use. What can I expect for my preventive care visit? Physical exam Your health care provider will check your:  Height and weight. These may be used to calculate your BMI (body mass index). BMI is a measurement that tells if you are at a healthy weight.  Heart rate and blood pressure.  Body temperature.  Skin for abnormal spots. Counseling Your health care provider may ask you questions about your:  Past medical problems.  Family's medical history.  Alcohol, tobacco, and drug use.  Emotional well-being.  Home life and relationship well-being.  Sexual activity.  Diet, exercise, and sleep habits.  History of falls.  Memory and ability to understand (cognition).  Work and work environment.  Access to firearms. What immunizations do I need? Vaccines are usually given at various ages, according to a schedule. Your health care provider will recommend vaccines for you based on your age, medical history, and lifestyle or other factors, such as travel or where you work.   What tests do I need? Blood tests  Lipid and cholesterol levels. These may be checked every 5 years, or more often depending on your overall health.  Hepatitis C test.  Hepatitis B test. Screening  Lung cancer screening. You may have this screening every year starting at age 77 if you have a 30-pack-year history of smoking and currently smoke or have quit within the past 15 years.  Colorectal  cancer screening. ? All adults should have this screening starting at age 77 and continuing until age 75. ? Your health care provider may recommend screening at age 45 if you are at increased risk. ? You will have tests every 1-10 years, depending on your results and the type of screening test.  Prostate cancer screening. Recommendations will vary depending on your family history and other risks.  Genital exam to check for testicular cancer or hernias.  Diabetes screening. ? This is done by checking your blood sugar (glucose) after you have not eaten for a while (fasting). ? You may have this done every 1-3 years.  Abdominal aortic aneurysm (AAA) screening. You may need this if you are a current or former smoker.  STD (sexually transmitted disease) testing, if you are at risk. Follow these instructions at home: Eating and drinking  Eat a diet that includes fresh fruits and vegetables, whole grains, lean protein, and low-fat dairy products. Limit your intake of foods with high amounts of sugar, saturated fats, and salt.  Take vitamin and mineral supplements as recommended by your health care provider.  Do not drink alcohol if your health care provider tells you not to drink.  If you drink alcohol: ? Limit how much you have to 0-2 drinks a day. ? Be aware of how much alcohol is in your drink. In the [place] one drink equals one 12 oz bottle of beer (355 mL), one 5 oz glass of wine (148 mL), or one 1 oz glass of hard liquor (44 mL).   Lifestyle  Take daily care of your teeth   and gums. Brush your teeth every morning and night with fluoride toothpaste. Floss one time each day.  Stay active. Exercise for at least 30 minutes 5 or more days each week.  Do not use any products that contain nicotine or tobacco, such as cigarettes, e-cigarettes, and chewing tobacco. If you need help quitting, ask your health care provider.  Do not use drugs.  If you are sexually active, practice safe sex.  Use a condom or other form of protection to prevent STIs (sexually transmitted infections).  Talk with your health care provider about taking a low-dose aspirin or statin.  Find healthy ways to cope with stress, such as: ? Meditation, yoga, or listening to music. ? Journaling. ? Talking to a trusted person. ? Spending time with friends and family. Safety  Always wear your seat belt while driving or riding in a vehicle.  Do not drive: ? If you have been drinking alcohol. Do not ride with someone who has been drinking. ? When you are tired or distracted. ? While texting.  Wear a helmet and other protective equipment during sports activities.  If you have firearms in your house, make sure you follow all gun safety procedures. What's next?  Visit your health care provider once a year for an annual wellness visit.  Ask your health care provider how often you should have your eyes and teeth checked.  Stay up to date on all vaccines. This information is not intended to replace advice given to you by your health care provider. Make sure you discuss any questions you have with your health care provider. Document Revised: 05/09/2019 Document Reviewed: 08/04/2018 Elsevier Patient Education  2021 [employer]  

## 2021-01-21 NOTE — Progress Notes (Signed)
Subjective:   Michael [name] is a 75 y.o. male who presents for Medicare Annual/Subsequent preventive examination.  Review of Systems    General:   No F/C, wt loss Pulm:   No DIB, pleuritic chest pain, +SOB Card:  No CP, palpitations Abd:  No n/v/d or pain Ext:  No inc edema from baseline    Objective:    Today's Vitals   01/21/21 0939  BP: 103/67  Pulse: 87  Temp: (!) 97.4 F (36.3 C)  SpO2: 94%  Weight: 208 lb 6.4 oz (94.5 kg)  Height: _0  (1.702 m)   Body mass index is 32.64 kg/m.  Advanced Directives 08/03/2020 05/24/2017 11/24/2016 10/27/2011 10/27/2011  Does Patient Have a Medical Advance Directive? No Yes No Patient does not have advance directive;Patient would not like information Patient does not have advance directive  Type of Advance Directive - Living will;Healthcare Power of Attorney - - -  Would patient like information on creating a medical advance directive? No - Patient declined - No - Patient declined - -    Current Medications (verified) Outpatient Encounter Medications as of 01/21/2021  Medication Sig  . atorvastatin (LIPITOR) 20 MG tablet TAKE 1 TABLET BY MOUTH DAILY ALTERNATINGWITH 2 TABLETS EVERY OTHER DAY AT BEDTIME (Patient taking differently: Take 20-40 mg by mouth See admin instructions. Take 20 mg by mouth alternating with 40 mg tablets every other day.)  . clopidogrel (PLAVIX) 75 MG tablet TAKE 1 TABLET BY MOUTH DAILY  . fenofibrate (TRICOR) 48 MG tablet TAKE 1 TABLET BY MOUTH DAILY  . levothyroxine (SYNTHROID) 75 MCG tablet TAKE 1 TABLET BY MOUTH DAILY  . metoprolol tartrate (LOPRESSOR) 25 MG tablet TAKE 1 TABLET BY MOUTH TWICE DAILY (Patient taking differently: Take 25 mg by mouth 2 (two) times daily.)  . nitroGLYCERIN (NITROSTAT) 0.4 MG SL tablet Place 1 tablet (0.4 mg total) under the tongue every 5 (five) minutes as needed. After 3 doses, if no relief dial 911  . Vitamin D, Ergocalciferol, (DRISDOL) 1.25 MG (50000 UNIT) CAPS capsule TAKE 1  CAPSULE BY MOUTH EVERY 7 DAYS   No facility-administered encounter medications on file as of 01/21/2021.    Allergies (verified) Ace inhibitors   History: Past Medical History:  Diagnosis Date  . Arthritis    "right hip; right wrist" (11/24/2016)  . CAD, multiple vessel 05/2011   90% mid LAD with diffuse proximal stenosis, 100% proximal RCA --> s/p CABG x 2 (LIMA-LAD, SVG-dRCA); b) Lexiscan Myoview 09/2016 w/ Inferior-Inferolateral defect -> Cath:  11/24/16: 100% CTO of SVG-RCA (but nagtive /RCA only subtotal occlusion) --> PCI with DES x2 (resolute onyx DES 2.25 mm x 30 mm, 2.5 mm x 20 mm) RCA  . Carotid artery occlusion   . COPD (chronic obstructive pulmonary disease) with emphysema ([place])   . Essential hypertension   . History of gout   . History of tobacco abuse    quit 05/2011  . Hyperlipidemia with target LDL less than 70   . Hypothyroidism   . MI (myocardial infarction) ([place]) 05/2011   "I had 3 in 2 days"  . NSTEMI (non-ST elevated myocardial infarction) ([place]) 06/05/2011   With CHF - probably late presentation for missed STEMI --> CATH- MV CAD;; Echo 07/2001: EF > 55%, mild MAC, mild TR, no WMA  with Post-op Setpal dyskinesis, Apex not seen)  . PAD (peripheral artery disease) ([place]) 06/08/2011   a) R Common Iliac 100%, L Common Iliac 80+% occlusion; b) L CIA - Aterectomy- 10  mm x 40 mm self-extending Stent (postdilated to 7 mm with 20-30% residual)--> Deferred R AoFem bypass;; c) LEA Dopplers 04/2012 & 04/2014 - RABI 0.8 & LABI 1.1 - STABLE, no change  . Pneumonia 1940s X 1  . Presence of drug coated stent in right coronary artery 11/24/2016   p-mRCA: Resolute Onyx DES 2.25 mm x 38 mm --> 2.5 mm x 18 mm (post-dilated from 2.7-2.8 mm)   Past Surgical History:  Procedure Laterality Date  . CORONARY ANGIOPLASTY WITH STENT PLACEMENT  11/24/2016   with 100% SVG-RCA & flow in native RCA now present --> PCI-p-m RCA 2 Overlapping Resolute DES (2.25 x 38, 1.5 x 18)  . CORONARY ARTERY BYPASS  GRAFT  06/10/2011   oLIMA to LAD, SVG to PDA (Dr. Servando Snare)  . CORONARY STENT INTERVENTION N/A 11/24/2016   Procedure: Coronary Stent Intervention;  Surgeon: Leonie Man, MD;  Location: [facility] INVASIVE CV LAB: [name] 2.25X38 & overlapping DES RESOLUTE ONYX 2.5X18   . ILIAC ARTERY STENT  10/27/2011   (Dr. Ellyn Hack Dr. Gwenlyn Found) left CIA - 50mx40mm self-expanding stent, decreasing downt to 20-30% lesion, 100% occluded R CIA with extensive lumbar collateralization to R internal iliac;  05/2016: Ao-Ileac & LEA Dopplers: RABI 0.8, LABI 1.2. B TBI 0.77 -- he has known B disease s/p L CIA stent. RLABI decreased and LABI is normal; aortoiliac atherosclerosis w/ CTO of R CIA but normal REIA.  .Marland KitchenLEFT HEART CATH AND CORONARY ANGIOGRAPHY  06/09/2011   90% mid LAD with diffuse proximal stenosis, 100% proximal RCA --> s/p CABG x 2 (LIMA-LAD, SVG-dRCA);  .Marland KitchenLEFT HEART CATH AND CORS/GRAFTS ANGIOGRAPHY N/A 11/24/2016   Procedure: LEFT HEART CATH AND CORS/GRAFTS ANGIOGRAPHY;  Surgeon: DLeonie Man MD;  Location: [facility] INVASIVE CV LAB: 100% SVG-RCA, but now native RCA only sub-CTO with antegrade flow.  85% mLAD after D1. 40% oOM1 --> PCI of Native RCA  . NM MYOVIEW LTD  09/2016    EF 62%. Medium sized, moderate severity defect in the basal inferior, mid inferoseptal and mid inferior walls is partially reversible. - INTERMEDIATE RISK -- CATH  . PERCUTANEOUS STENT INTERVENTION Left 10/27/2011   Procedure: PERCUTANEOUS STENT INTERVENTION;  Surgeon: JLorretta Harp MD / DLeonie Man MD;  [facility];  Service: Cardiovascular;  Laterality: Left;  . TONSILLECTOMY  ~ 1963  . TRANSTHORACIC ECHOCARDIOGRAM  08/19/2011   EF=>55%; mild mitral annular calcif; mild TR   Family History  Problem Relation Age of Onset  . Heart disease Mother        valve replacement  . Diabetes Mother   . Heart failure Mother   . CAD Sister        cardiac stent  . Stroke Sister        stents in neck  . Pneumonia Father     Social History   Socioeconomic History  . Marital status: Divorced    Spouse name: Not on file  . Number of children: 3  . Years of education: Not on file  . Highest education level: Not on file  Occupational History  . Not on file  Tobacco Use  . Smoking status: Former Smoker    Packs/day: 2.00    Years: 51.50    Pack years: 103.00    Types: Cigarettes    Quit date: 06/08/2011    Years since quitting: 9.6  . Smokeless tobacco: Former [employer] developer   Types: Chew    Quit date: 07/13/2003  Vaping  Use  . Vaping Use: Never used  Substance and Sexual Activity  . Alcohol use: No    Comment: 11/24/2016 "nothing in 2 1/2 years; never an alcoholic"  . Drug use: No  . Sexual activity: Never  Other Topics Concern  . Not on file  Social History Narrative  . Not on file   Social Determinants of Health   Financial Resource Strain: Not on file  Food Insecurity: Not on file  Transportation Needs: Not on file  Physical Activity: Not on file  Stress: Not on file  Social Connections: Not on file    Tobacco Counseling Counseling given: Not Answered   Diabetic? No   Activities of Daily Living In your present state of health, do you have any difficulty performing the following activities: 01/21/2021 09/23/2020  Hearing? Tempie Donning  Vision? N N  Difficulty concentrating or making decisions? Tempie Donning  Walking or climbing stairs? Y Y  Dressing or bathing? Y Y  Doing errands, shopping? Tempie Donning  Some recent data might be hidden    Patient Care Team: Lorrene Reid, PA-C as PCP - General Leonie Man, MD as PCP - Cardiology (Cardiology) Leonie Man, MD as Consulting Physician (Cardiology)  Indicate any recent Medical Services you may have received from other than [facility] providers in the past year (date may be approximate).     Assessment:   This is a routine wellness examination for Zyere.  Hearing/Vision screen No exam data present  Dietary issues and exercise activities  discussed: -Recommend to continue low sodium diet, follow a diet low in saturated and trans fats. Activity is limited by COPD/dyspnea.  Goals Addressed   None    Depression Screen PHQ 2/9 Scores 01/21/2021 09/23/2020 08/07/2020 05/17/2020 01/15/2020 07/10/2019 03/09/2019  PHQ - 2 Score 0 0 3 0 3 0 -  PHQ- 9 Score _0 -  Exception Documentation - - - - - - Patient refusal    Fall Risk Fall Risk  01/21/2021 09/23/2020 08/07/2020 05/17/2020 01/15/2020  Falls in the past year? 0 0 0 0 0  Number falls in past yr: 0 - - - -  Injury with Fall? 0 - - - -  Risk for fall due to : No Fall Risks - No Fall Risks - -  Follow up _1     FALL RISK PREVENTION PERTAINING TO THE HOME:  Any stairs in or around the home? No  If so, are there any without handrails? No  Home free of loose throw rugs in walkways, pet beds, electrical cords, etc? Yes  Adequate lighting in your home to reduce risk of falls? Yes   ASSISTIVE DEVICES UTILIZED TO PREVENT FALLS:  Life alert? No  Use of a cane, walker or w/c? No  Grab bars in the bathroom? Yes  Shower chair or bench in shower? Yes  Elevated toilet seat or a handicapped toilet? No   TIMED UP AND GO:  Was the test performed? Yes .  Length of time to ambulate 10 feet: 16 sec.   Gait slow and steady without use of assistive device  Cognitive Function:   6CIT Screen 01/21/2021 01/15/2020 01/11/2019 10/22/2017  What Year? 0 points 0 points 0 points 0 points  What month? 0 points 0 points 0 points 0 points  What time? 3 points 0 points 0 points 0 points  Count back from 20 0 points  0 points 0 points 0 points  Months in reverse 0 points 0 points 0 points 0 points  Repeat phrase 10 points 8 points 10 points 10 points  Total Score _0 Immunizations  There is no immunization history on file for this patient.  TDAP  status: Due, Education has been provided regarding the importance of this vaccine. Advised may receive this vaccine at local pharmacy or Health Dept. Aware to provide a copy of the vaccination record if obtained from local pharmacy or Health Dept. Verbalized acceptance and understanding.  Flu Vaccine status: Due, Education has been provided regarding the importance of this vaccine. Advised may receive this vaccine at local pharmacy or Health Dept. Aware to provide a copy of the vaccination record if obtained from local pharmacy or Health Dept. Verbalized acceptance and understanding.  Pneumococcal vaccine status: Due, Education has been provided regarding the importance of this vaccine. Advised may receive this vaccine at local pharmacy or Health Dept. Aware to provide a copy of the vaccination record if obtained from local pharmacy or Health Dept. Verbalized acceptance and understanding.  Covid-19 vaccine status: Declined, Education has been provided regarding the importance of this vaccine but patient still declined. Advised may receive this vaccine at local pharmacy or Health Dept.or vaccine clinic. Aware to provide a copy of the vaccination record if obtained from local pharmacy or Health Dept. Verbalized acceptance and understanding.  Qualifies for Shingles Vaccine? Yes   Zostavax completed No   Shingrix Completed?: No.    Education has been provided regarding the importance of this vaccine. Patient has been advised to call insurance company to determine out of pocket expense if they have not yet received this vaccine. Advised may also receive vaccine at local pharmacy or Health Dept. Verbalized acceptance and understanding.  Screening Tests Health Maintenance  Topic Date Due  . COVID-19 Vaccine (1) Never done  . TETANUS/TDAP  Never done  . Zoster Vaccines- Shingrix (1 of 2) Never done  . PNA vac Low Risk Adult (1 of 2 - PCV13) Never done  . INFLUENZA VACCINE  03/24/2021  . Hepatitis C  Screening  Completed  . HPV VACCINES  Aged Out    Health Maintenance  Health Maintenance Due  Topic Date Due  . COVID-19 Vaccine (1) Never done  . TETANUS/TDAP  Never done  . Zoster Vaccines- Shingrix (1 of 2) Never done  . PNA vac Low Risk Adult (1 of 2 - PCV13) Never done    Colorectal cancer screening: Referral to GI placed 01/21/2021. Pt aware the office will call re: appt.  Lung Cancer Screening: (Low Dose CT Chest recommended if Age 63-80 years, 30 pack-year currently smoking OR have quit w/in 15years.) does qualify.   Lung Cancer Screening Referral: placed  Additional Screening:  Hepatitis C Screening: does qualify; Completed 10/22/2017.  Vision Screening: Recommended annual ophthalmology exams for early detection of glaucoma and other disorders of the eye. Is the patient up to date with their annual eye exam?  Yes  Who is the provider or what is the name of the office in which the patient attends annual eye exams?  If pt is not established with a provider, would they like to be referred to a provider to establish care? No .   Dental Screening: Recommended annual dental exams for proper oral hygiene  Community Resource Referral / Chronic Care Management: CRR required this visit?  No   CCM required this visit?  No  Plan:  -Will continue to monitor cognitive function. -Continue current medication regimen. (of note, pt reports not able to afford inhalers for COPD, advised to contact insurance for formulary list). Continue supplemental oxygen (on 4L). -Will collect fasting labs. -Continue tobacco cessation. -Vitals are stable. -Continue to follow up with Cardiology. -Follow up in 4-6 months for HTN, HLD, PreDM  I have personally reviewed and noted the following in the patient's chart:   . Medical and social history . Use of alcohol, tobacco or illicit drugs  . Current medications and supplements including opioid prescriptions. Patient is not currently taking  opioid prescriptions. . Functional ability and status . Nutritional status . Physical activity . Advanced directives . List of other physicians . Hospitalizations, surgeries, and ER visits in previous 12 months . Vitals . Screenings to include cognitive, depression, and falls . Referrals and appointments  In addition, I have reviewed and discussed with patient certain preventive protocols, quality metrics, and best practice recommendations. A written personalized care plan for preventive services as well as general preventive health recommendations were provided to patient.     Lorrene Reid, PA-C   01/21/2021

## 2021-01-22 LAB — CBC WITH DIFFERENTIAL/PLATELET
Basophils Absolute: 0 10*3/uL (ref 0.0–0.2)
Basos: 0 %
EOS (ABSOLUTE): 0 10*3/uL (ref 0.0–0.4)
Eos: 0 %
Hematocrit: 43.6 % (ref 37.5–51.0)
Hemoglobin: 14.7 g/dL (ref 13.0–17.7)
Immature Grans (Abs): 0 10*3/uL (ref 0.0–0.1)
Immature Granulocytes: 0 %
Lymphocytes Absolute: 0.7 10*3/uL (ref 0.7–3.1)
Lymphs: 8 %
MCH: 31.8 pg (ref 26.6–33.0)
MCHC: 33.7 g/dL (ref 31.5–35.7)
MCV: 94 fL (ref 79–97)
Monocytes Absolute: 0.7 10*3/uL (ref 0.1–0.9)
Monocytes: 8 %
Neutrophils Absolute: 7.6 10*3/uL — ABNORMAL HIGH (ref 1.4–7.0)
Neutrophils: 84 %
Platelets: 243 10*3/uL (ref 150–450)
RBC: 4.62 x10E6/uL (ref 4.14–5.80)
RDW: 12.9 % (ref 11.6–15.4)
WBC: 9.1 10*3/uL (ref 3.4–10.8)

## 2021-01-22 LAB — LIPID PANEL
Chol/HDL Ratio: 3.5 ratio (ref 0.0–5.0)
Cholesterol, Total: 143 mg/dL (ref 100–199)
HDL: 41 mg/dL (ref >39)
LDL Chol Calc (NIH): 80 mg/dL (ref 0–99)
Triglycerides: 121 mg/dL (ref 0–149)
VLDL Cholesterol Cal: 22 mg/dL (ref 5–40)

## 2021-01-22 LAB — COMPREHENSIVE METABOLIC PANEL
ALT: 207 IU/L — ABNORMAL HIGH (ref 0–44)
AST: 148 IU/L — ABNORMAL HIGH (ref 0–40)
Albumin/Globulin Ratio: 1.2 (ref 1.2–2.2)
Albumin: 4 g/dL (ref 3.7–4.7)
Alkaline Phosphatase: 87 IU/L (ref 44–121)
BUN/Creatinine Ratio: 11 (ref 10–24)
BUN: 19 mg/dL (ref 8–27)
Bilirubin Total: 1.5 mg/dL — ABNORMAL HIGH (ref 0.0–1.2)
CO2: 20 mmol/L (ref 20–29)
Calcium: 9.5 mg/dL (ref 8.6–10.2)
Chloride: 99 mmol/L (ref 96–106)
Creatinine, Ser: 1.73 mg/dL — ABNORMAL HIGH (ref 0.76–1.27)
Globulin, Total: 3.4 g/dL (ref 1.5–4.5)
Glucose: 103 mg/dL — ABNORMAL HIGH (ref 65–99)
Potassium: 4.3 mmol/L (ref 3.5–5.2)
Sodium: 136 mmol/L (ref 134–144)
Total Protein: 7.4 g/dL (ref 6.0–8.5)
eGFR: 40 mL/min/{1.73_m2} — ABNORMAL LOW (ref >59)

## 2021-01-22 LAB — VITAMIN D 25 HYDROXY (VIT D DEFICIENCY, FRACTURES): Vit D, 25-Hydroxy: 45.8 ng/mL (ref 30.0–100.0)

## 2021-01-22 LAB — T4, FREE: Free T4: 1.27 ng/dL (ref 0.82–1.77)

## 2021-01-22 LAB — HEMOGLOBIN A1C
Est. average glucose Bld gHb Est-mCnc: 120 mg/dL
Hgb A1c MFr Bld: 5.8 % — ABNORMAL HIGH (ref 4.8–5.6)

## 2021-01-22 LAB — T3: T3, Total: 68 ng/dL — ABNORMAL LOW (ref 71–180)

## 2021-01-22 LAB — TSH: TSH: 1.92 u[IU]/mL (ref 0.450–4.500)

## 2021-01-23 ENCOUNTER — Telehealth: Payer: Self-pay

## 2021-01-23 NOTE — Telephone Encounter (Signed)
liver function in 2 weeks.    Please contact patient to schedule lab apt In 2 wks

## 2021-01-23 NOTE — Telephone Encounter (Signed)
Thanks

## 2021-01-23 NOTE — Telephone Encounter (Signed)
Patient scheduled.

## 2021-02-03 DIAGNOSIS — Z1211 Encounter for screening for malignant neoplasm of colon: Secondary | ICD-10-CM | POA: Diagnosis not present

## 2021-02-03 LAB — COLOGUARD: Cologuard: NEGATIVE

## 2021-02-04 ENCOUNTER — Other Ambulatory Visit: Payer: Self-pay | Admitting: Physician Assistant

## 2021-02-04 DIAGNOSIS — R7989 Other specified abnormal findings of blood chemistry: Secondary | ICD-10-CM

## 2021-02-06 ENCOUNTER — Other Ambulatory Visit: Payer: Self-pay

## 2021-02-06 ENCOUNTER — Other Ambulatory Visit: Payer: Medicare HMO

## 2021-02-06 DIAGNOSIS — R7989 Other specified abnormal findings of blood chemistry: Secondary | ICD-10-CM

## 2021-02-06 DIAGNOSIS — R945 Abnormal results of liver function studies: Secondary | ICD-10-CM | POA: Diagnosis not present

## 2021-02-06 DIAGNOSIS — E785 Hyperlipidemia, unspecified: Secondary | ICD-10-CM | POA: Diagnosis not present

## 2021-02-07 ENCOUNTER — Telehealth: Payer: Self-pay | Admitting: Physician Assistant

## 2021-02-07 LAB — HEPATIC FUNCTION PANEL
ALT: 24 IU/L (ref 0–44)
AST: 22 IU/L (ref 0–40)
Albumin: 4 g/dL (ref 3.7–4.7)
Alkaline Phosphatase: 68 IU/L (ref 44–121)
Bilirubin Total: 0.6 mg/dL (ref 0.0–1.2)
Bilirubin, Direct: 0.17 mg/dL (ref 0.00–0.40)
Total Protein: 6.9 g/dL (ref 6.0–8.5)

## 2021-02-10 ENCOUNTER — Ambulatory Visit
Admission: RE | Admit: 2021-02-10 | Discharge: 2021-02-10 | Disposition: A | Discharge status: Home / Self Care | Payer: Medicare HMO | Source: Ambulatory Visit | Attending: Physician Assistant | Admitting: Physician Assistant

## 2021-02-10 ENCOUNTER — Other Ambulatory Visit: Payer: Self-pay

## 2021-02-10 DIAGNOSIS — Z122 Encounter for screening for malignant neoplasm of respiratory organs: Secondary | ICD-10-CM

## 2021-02-10 DIAGNOSIS — Z87891 Personal history of nicotine dependence: Secondary | ICD-10-CM

## 2021-02-19 ENCOUNTER — Other Ambulatory Visit: Payer: Self-pay | Admitting: Physician Assistant

## 2021-02-19 DIAGNOSIS — E039 Hypothyroidism, unspecified: Secondary | ICD-10-CM

## 2021-03-03 ENCOUNTER — Encounter: Payer: Self-pay | Admitting: Physician Assistant

## 2021-03-03 LAB — COLOGUARD: Cologuard: NEGATIVE

## 2021-05-16 ENCOUNTER — Other Ambulatory Visit: Payer: Self-pay | Admitting: Physician Assistant

## 2021-05-16 DIAGNOSIS — E039 Hypothyroidism, unspecified: Secondary | ICD-10-CM

## 2021-05-23 ENCOUNTER — Encounter: Payer: Self-pay | Admitting: Physician Assistant

## 2021-05-23 ENCOUNTER — Ambulatory Visit (INDEPENDENT_AMBULATORY_CARE_PROVIDER_SITE_OTHER): Payer: Medicare HMO | Admitting: Physician Assistant

## 2021-05-23 ENCOUNTER — Other Ambulatory Visit: Payer: Self-pay

## 2021-05-23 VITALS — BP 121/69 | HR 68 | Temp 97.5°F | Ht 67.0 in | Wt 198.7 lb

## 2021-05-23 DIAGNOSIS — E785 Hyperlipidemia, unspecified: Secondary | ICD-10-CM

## 2021-05-23 DIAGNOSIS — E039 Hypothyroidism, unspecified: Secondary | ICD-10-CM | POA: Diagnosis not present

## 2021-05-23 DIAGNOSIS — I10 Essential (primary) hypertension: Secondary | ICD-10-CM | POA: Diagnosis not present

## 2021-05-23 DIAGNOSIS — J439 Emphysema, unspecified: Secondary | ICD-10-CM | POA: Diagnosis not present

## 2021-05-23 DIAGNOSIS — J9611 Chronic respiratory failure with hypoxia: Secondary | ICD-10-CM | POA: Diagnosis not present

## 2021-05-23 DIAGNOSIS — R109 Unspecified abdominal pain: Secondary | ICD-10-CM

## 2021-05-23 DIAGNOSIS — R7303 Prediabetes: Secondary | ICD-10-CM

## 2021-05-23 DIAGNOSIS — I130 Hypertensive heart and chronic kidney disease with heart failure and stage 1 through stage 4 chronic kidney disease, or unspecified chronic kidney disease: Secondary | ICD-10-CM | POA: Diagnosis not present

## 2021-05-23 DIAGNOSIS — I509 Heart failure, unspecified: Secondary | ICD-10-CM | POA: Diagnosis not present

## 2021-05-23 NOTE — Assessment & Plan Note (Signed)
-  Patient declined referral to Pulmonology. -Ox saturation 95% resting with 4L of portable oxygen.

## 2021-05-23 NOTE — Assessment & Plan Note (Addendum)
-  Placed new gastroenterology referral. -Patient was admitted 08/03/2020 for abdominal pain and treated for GERD or PUD with high dose of PPI therapy. Will place new gastroenterology referral. Will collect CBC to evaluate for anemia/blood loss. Advised to trial low dose of omeprazole 20 mg and/or famotidine 20 mg, patient has stage III CKD and recommend cautious use of PPI therapy.

## 2021-05-23 NOTE — Assessment & Plan Note (Signed)
-  Last A1c 5.8, repeating today. -Follow low carbohydrate and glucose diet.

## 2021-05-23 NOTE — Progress Notes (Signed)
Established Patient Office Visit  Subjective:  Patient ID: Michael [name], male    DOB: February 25, 1946  Age: 75 y.o. MRN: [id number]  CC:  Chief Complaint  Patient presents with   Follow-up   Hypertension   Hyperlipidemia    HPI Michael [name] presents for follow up on hypertension, hyperlipidemia and prediabetes. Patient reports STILL having constant abdominal pain. Reports one episode of vomiting and noticed some blood, unsure how long ago. Denies bowel changes or diarrhea. Does report decreased appetite and fluid intake. Has tried Tums and generic pepto-bismol with minimal relief, states used to work in the past. Does report fever and sweats, none recently. No bloody stool. No alleviating factors. Patient had been referred to gastroenterology and he cancelled his appointment. Requesting new referral.   HTN: Pt denies chest pain, palpitations, dizziness or significant lower extremity swelling. Taking medication as directed without side effects.   HLD: Pt taking medication as directed without issues. Reports decreased appetite. Patient's activity is limited by his COPD.  COPD: Reports using 5L of oxygen at home. Portable oxygen at 4L. Not interested on seeing pulmonologist and states inhaler did not work in the past and cannot afford them.  Prediabetes: No increased thirst or urination. States with decreased appetite not eating as much carbs/sugar.  Past Medical History:  Diagnosis Date   Arthritis    "right hip; right wrist" (11/24/2016)   CAD, multiple vessel 05/2011   90% mid LAD with diffuse proximal stenosis, 100% proximal RCA --> s/p CABG x 2 (LIMA-LAD, SVG-dRCA); b) Lexiscan Myoview 09/2016 w/ Inferior-Inferolateral defect -> Cath:  11/24/16: 100% CTO of SVG-RCA (but nagtive /RCA only subtotal occlusion) --> PCI with DES x2 (resolute onyx DES 2.25 mm x 30 mm, 2.5 mm x 20 mm) RCA   Carotid artery occlusion    COPD (chronic obstructive pulmonary disease) with emphysema (HCC)    Essential  hypertension    History of gout    History of tobacco abuse    quit 05/2011   Hyperlipidemia with target LDL less than 70    Hypothyroidism    MI (myocardial infarction) ([place]) 05/2011   "I had 3 in 2 days"   NSTEMI (non-ST elevated myocardial infarction) ([place]) 06/05/2011   With CHF - probably late presentation for missed STEMI --> CATH- MV CAD;; Echo 07/2001: EF > 55%, mild MAC, mild TR, no WMA  with Post-op Setpal dyskinesis, Apex not seen)   PAD (peripheral artery disease) ([place]) 06/08/2011   a) R Common Iliac 100%, L Common Iliac 80+% occlusion; b) L CIA - Aterectomy- 10 mm x 40 mm self-extending Stent (postdilated to 7 mm with 20-30% residual)--> Deferred R AoFem bypass;; c) LEA Dopplers 04/2012 & 04/2014 - RABI 0.8 & LABI 1.1 - STABLE, no change   Pneumonia 1940s X 1   Presence of drug coated stent in right coronary artery 11/24/2016   p-mRCA: Resolute Onyx DES 2.25 mm x 38 mm --> 2.5 mm x 18 mm (post-dilated from 2.7-2.8 mm)    Past Surgical History:  Procedure Laterality Date   CORONARY ANGIOPLASTY WITH STENT PLACEMENT  11/24/2016   with 100% SVG-RCA & flow in native RCA now present --> PCI-p-m RCA 2 Overlapping Resolute DES (2.25 x 38, 1.5 x 18)   CORONARY ARTERY BYPASS GRAFT  06/10/2011   oLIMA to LAD, SVG to PDA (Dr. Servando Snare)   [place] N/A 11/24/2016   Procedure: Coronary Stent Intervention;  Surgeon: Leonie Man, MD;  Location: [place] [facility]:  DES RESOLUTE ONYX 2.25X38 & overlapping DES RESOLUTE ONYX 2.5X18    ILIAC ARTERY STENT  10/27/2011   (Dr. Ellyn Hack Dr. Gwenlyn Found) left CIA - 64mx40mm self-expanding stent, decreasing downt to 20-30% lesion, 100% occluded R CIA with extensive lumbar collateralization to R internal iliac;  05/2016: Ao-Ileac & LEA Dopplers: RABI 0.8, LABI 1.2. B TBI 0.77 -- he has known B disease s/p L CIA stent. RLABI decreased and LABI is normal; aortoiliac atherosclerosis w/ CTO of R CIA but normal REIA.   LEFT HEART CATH AND CORONARY  ANGIOGRAPHY  06/09/2011   90% mid LAD with diffuse proximal stenosis, 100% proximal RCA --> s/p CABG x 2 (LIMA-LAD, SVG-dRCA);   LEFT HEART CATH AND CORS/GRAFTS ANGIOGRAPHY N/A 11/24/2016   Procedure: LEFT HEART CATH AND CORS/GRAFTS ANGIOGRAPHY;  Surgeon: DLeonie Man MD;  [facility]: 100% SVG-RCA, but now native RCA only sub-CTO with antegrade flow.  85% mLAD after D1. 40% oOM1 --> PCI of Native RCA   NM MYOVIEW LTD  09/2016    EF 62%. Medium sized, moderate severity defect in the basal inferior, mid inferoseptal and mid inferior walls is partially reversible. - INTERMEDIATE RISK -- CATH   PERCUTANEOUS STENT INTERVENTION Left 10/27/2011   Procedure: PERCUTANEOUS STENT INTERVENTION;  Surgeon: JLorretta Harp MD / DLeonie Man MD;  [facility];  Service: Cardiovascular;  Laterality: Left;   TONSILLECTOMY  ~ 1963   TRANSTHORACIC ECHOCARDIOGRAM  08/19/2011   EF=>55%; mild mitral annular calcif; mild TR    Family History  Problem Relation Age of Onset   Heart disease Mother        valve replacement   Diabetes Mother    Heart failure Mother    CAD Sister        cardiac stent   Stroke Sister        stents in neck   Pneumonia Father     Social History   Socioeconomic History   Marital status: Divorced    Spouse name: Not on file   Number of children: 3   Years of education: Not on file   Highest education level: Not on file  Occupational History   Not on file  Tobacco Use   Smoking status: Former    Packs/day: 2.00    Years: 51.50    Pack years: 103.00    Types: Cigarettes    Quit date: 06/08/2011    Years since quitting: 9.9   Smokeless tobacco: Former    Types: Chew    Quit date: 07/13/2003  Vaping Use   Vaping Use: Never used  Substance and Sexual Activity   Alcohol use: No    Comment: 11/24/2016 "nothing in 2 1/2 years; never an alcoholic"   Drug use: No   Sexual activity: Never  Other Topics Concern   Not on file  Social History  Narrative   Not on file   Social Determinants of Health   Financial Resource Strain: Not on file  Food Insecurity: Not on file  Transportation Needs: Not on file  Physical Activity: Not on file  Stress: Not on file  Social Connections: Not on file  Intimate Partner Violence: Not on file    Outpatient Medications Prior to Visit  Medication Sig Dispense Refill   atorvastatin (LIPITOR) 20 MG tablet TAKE 1 TABLET BY MOUTH DAILY ALTERNATINGWITH 2 TABLETS EVERY OTHER DAY AT BEDTIME (Patient taking differently: Take 20-40 mg by mouth See admin instructions. Take 20 mg by mouth  alternating with 40 mg tablets every other day.) 180 tablet 3   clopidogrel (PLAVIX) 75 MG tablet TAKE 1 TABLET BY MOUTH DAILY 90 tablet 3   fenofibrate (TRICOR) 48 MG tablet TAKE 1 TABLET BY MOUTH DAILY 90 tablet 3   levothyroxine (SYNTHROID) 75 MCG tablet TAKE 1 TABLET BY MOUTH DAILY 90 tablet 0   metoprolol tartrate (LOPRESSOR) 25 MG tablet TAKE 1 TABLET BY MOUTH TWICE DAILY (Patient taking differently: Take 25 mg by mouth 2 (two) times daily.) 180 tablet 3   nitroGLYCERIN (NITROSTAT) 0.4 MG SL tablet Place 1 tablet (0.4 mg total) under the tongue every 5 (five) minutes as needed. After 3 doses, if no relief dial 911 30 tablet 0   Vitamin D, Ergocalciferol, (DRISDOL) 1.25 MG (50000 UNIT) CAPS capsule TAKE 1 CAPSULE BY MOUTH EVERY 7 DAYS 12 capsule 3   No facility-administered medications prior to visit.    Allergies  Allergen Reactions   Ace Inhibitors Cough    Pt reports he is not allergic to this 08/03/2020    ROS Review of Systems Review of Systems:  A fourteen system review of systems was performed and found to be positive as per HPI.   Objective:    Physical Exam General:  Pleasant and cooperative, in no acute distress  Neuro:  Alert and oriented,  extra-ocular muscles intact  HEENT:  Normocephalic, atraumatic, neck supple Skin:  no gross rash, warm, pink. Abdomen: Soft, non-tender, +BS, no  guarding or rebound tenderness  Cardiac:  RRR, S1 S2 Respiratory:  Decreased air sounds. No wheezing or rhonchi. Vascular:  Ext warm, no cyanosis apprec.; cap RF less 2 sec. Psych:  No HI/SI, judgement and insight good, Euthymic mood. Full Affect.  BP 121/69   Pulse 68   Temp (!) 97.5 F (36.4 C)   Ht _0  (1.702 m)   Wt 198 lb 11.2 oz (90.1 kg)   SpO2 95%   BMI 31.12 kg/m  Wt Readings from Last 3 Encounters:  05/23/21 198 lb 11.2 oz (90.1 kg)  01/21/21 208 lb 6.4 oz (94.5 kg)  09/23/20 213 lb 12.8 oz (97 kg)     Health Maintenance Due  Topic Date Due   COVID-19 Vaccine (1) Never done   TETANUS/TDAP  Never done   Zoster Vaccines- Shingrix (1 of 2) Never done   INFLUENZA VACCINE  Never done    There are no preventive care reminders to display for this patient.  Lab Results  Component Value Date   TSH 4.370 05/23/2021   Lab Results  Component Value Date   WBC 8.0 05/23/2021   HGB 13.4 05/23/2021   HCT 41.7 05/23/2021   MCV 97 05/23/2021   PLT 507 (H) 05/23/2021   Lab Results  Component Value Date   NA 140 05/23/2021   K 5.0 05/23/2021   CO2 20 05/23/2021   GLUCOSE 115 (H) 05/23/2021   BUN 16 05/23/2021   CREATININE 1.46 (H) 05/23/2021   BILITOT 1.0 05/23/2021   ALKPHOS 268 (H) 05/23/2021   AST 131 (H) 05/23/2021   ALT 108 (H) 05/23/2021   PROT 7.1 05/23/2021   ALBUMIN 3.8 05/23/2021   CALCIUM 9.9 05/23/2021   ANIONGAP 10 08/04/2020   EGFR 49 (L) 05/23/2021   Lab Results  Component Value Date   CHOL 143 01/21/2021   Lab Results  Component Value Date   HDL 41 01/21/2021   Lab Results  Component Value Date   LDLCALC 80 01/21/2021   Lab Results  Component Value Date   TRIG 121 01/21/2021   Lab Results  Component Value Date   CHOLHDL 3.5 01/21/2021   Lab Results  Component Value Date   HGBA1C 5.7 (H) 05/23/2021      Assessment & Plan:   Problem List Items Addressed This Visit       Cardiovascular and Mediastinum   Essential  hypertension - Primary (Chronic)    -Stable. -Continue current medication regimen. -Will continue to monitor.      Relevant Orders   Comp Met (CMET) (Completed)   CBC w/Diff (Completed)     Respiratory   Chronic respiratory failure with hypoxia (HCC) - SaO2 drops to 87% with ambulation (Chronic)    -Patient declined referral to Pulmonology. -Ox saturation 95% resting with 4L of portable oxygen.        Endocrine   Hypothyroidism    -Last TSH wnl -Continue current medication regimen. -Rechecking thyroid labs today. Pending results will make medication adjustments if indicated.       Relevant Orders   TSH (Completed)     Other   Pre-diabetes    -Last A1c 5.8, repeating today. -Follow low carbohydrate and glucose diet.      Relevant Orders   HgB A1c (Completed)   Abdominal pain    -Placed new gastroenterology referral. -Patient was admitted 08/03/2020 for abdominal pain and treated for GERD or PUD with high dose of PPI therapy. Will place new gastroenterology referral. Will collect CBC to evaluate for anemia/blood loss. Advised to trial low dose of omeprazole 20 mg and/or famotidine 20 mg, patient has stage III CKD and recommend cautious use of PPI therapy.      Relevant Orders   Comp Met (CMET) (Completed)   CBC w/Diff (Completed)   Lipase (Completed)   Ambulatory referral to Gastroenterology   Other Visit Diagnoses     Hyperlipidemia, unspecified hyperlipidemia type          Hyperlipidemia: -Last lipid panel wnl's, LDL 80 (goal <70). -Continue current medication regimen. -Will continue to monitor alongside cardiology.  Pulmonary emphysema: -Recommend to continue supplemental oxygen. -Patient continues to decline pulmonology referral and maintenance inhaler.  No orders of the defined types were placed in this encounter.   Follow-up: Return for [facility] in May 2023.    Lorrene Reid, PA-C

## 2021-05-24 LAB — LIPASE: Lipase: 40 U/L (ref 13–78)

## 2021-05-24 LAB — COMPREHENSIVE METABOLIC PANEL
ALT: 108 IU/L — ABNORMAL HIGH (ref 0–44)
AST: 131 IU/L — ABNORMAL HIGH (ref 0–40)
Albumin/Globulin Ratio: 1.2 (ref 1.2–2.2)
Albumin: 3.8 g/dL (ref 3.7–4.7)
Alkaline Phosphatase: 268 IU/L — ABNORMAL HIGH (ref 44–121)
BUN/Creatinine Ratio: 11 (ref 10–24)
BUN: 16 mg/dL (ref 8–27)
Bilirubin Total: 1 mg/dL (ref 0.0–1.2)
CO2: 20 mmol/L (ref 20–29)
Calcium: 9.9 mg/dL (ref 8.6–10.2)
Chloride: 101 mmol/L (ref 96–106)
Creatinine, Ser: 1.46 mg/dL — ABNORMAL HIGH (ref 0.76–1.27)
Globulin, Total: 3.3 g/dL (ref 1.5–4.5)
Glucose: 115 mg/dL — ABNORMAL HIGH (ref 70–99)
Potassium: 5 mmol/L (ref 3.5–5.2)
Sodium: 140 mmol/L (ref 134–144)
Total Protein: 7.1 g/dL (ref 6.0–8.5)
eGFR: 49 mL/min/{1.73_m2} — ABNORMAL LOW (ref >59)

## 2021-05-24 LAB — CBC WITH DIFFERENTIAL/PLATELET
Basophils Absolute: 0 10*3/uL (ref 0.0–0.2)
Basos: 1 %
EOS (ABSOLUTE): 0.1 10*3/uL (ref 0.0–0.4)
Eos: 1 %
Hematocrit: 41.7 % (ref 37.5–51.0)
Hemoglobin: 13.4 g/dL (ref 13.0–17.7)
Immature Grans (Abs): 0 10*3/uL (ref 0.0–0.1)
Immature Granulocytes: 0 %
Lymphocytes Absolute: 0.9 10*3/uL (ref 0.7–3.1)
Lymphs: 12 %
MCH: 31.3 pg (ref 26.6–33.0)
MCHC: 32.1 g/dL (ref 31.5–35.7)
MCV: 97 fL (ref 79–97)
Monocytes Absolute: 0.4 10*3/uL (ref 0.1–0.9)
Monocytes: 5 %
Neutrophils Absolute: 6.5 10*3/uL (ref 1.4–7.0)
Neutrophils: 81 %
Platelets: 507 10*3/uL — ABNORMAL HIGH (ref 150–450)
RBC: 4.28 x10E6/uL (ref 4.14–5.80)
RDW: 12.5 % (ref 11.6–15.4)
WBC: 8 10*3/uL (ref 3.4–10.8)

## 2021-05-24 LAB — TSH: TSH: 4.37 u[IU]/mL (ref 0.450–4.500)

## 2021-05-24 LAB — HEMOGLOBIN A1C
Est. average glucose Bld gHb Est-mCnc: 117 mg/dL
Hgb A1c MFr Bld: 5.7 % — ABNORMAL HIGH (ref 4.8–5.6)

## 2021-05-25 NOTE — Assessment & Plan Note (Signed)
-  Last TSH wnl -Continue current medication regimen. -Rechecking thyroid labs today. Pending results will make medication adjustments if indicated.  

## 2021-05-25 NOTE — Assessment & Plan Note (Signed)
-  Stable. -Continue current medication regimen.  -Will continue to monitor. 

## 2021-05-27 ENCOUNTER — Encounter: Payer: Self-pay | Admitting: Gastroenterology

## 2021-06-06 ENCOUNTER — Ambulatory Visit (INDEPENDENT_AMBULATORY_CARE_PROVIDER_SITE_OTHER): Payer: Medicare HMO | Admitting: Gastroenterology

## 2021-06-06 ENCOUNTER — Other Ambulatory Visit (INDEPENDENT_AMBULATORY_CARE_PROVIDER_SITE_OTHER): Payer: Medicare HMO

## 2021-06-06 ENCOUNTER — Encounter: Payer: Self-pay | Admitting: Gastroenterology

## 2021-06-06 VITALS — BP 124/80 | HR 66 | Ht 67.0 in | Wt 201.0 lb

## 2021-06-06 DIAGNOSIS — R7989 Other specified abnormal findings of blood chemistry: Secondary | ICD-10-CM | POA: Diagnosis not present

## 2021-06-06 DIAGNOSIS — K920 Hematemesis: Secondary | ICD-10-CM

## 2021-06-06 DIAGNOSIS — G8929 Other chronic pain: Secondary | ICD-10-CM

## 2021-06-06 DIAGNOSIS — R634 Abnormal weight loss: Secondary | ICD-10-CM | POA: Diagnosis not present

## 2021-06-06 DIAGNOSIS — R1310 Dysphagia, unspecified: Secondary | ICD-10-CM | POA: Diagnosis not present

## 2021-06-06 DIAGNOSIS — Z7902 Long term (current) use of antithrombotics/antiplatelets: Secondary | ICD-10-CM

## 2021-06-06 DIAGNOSIS — R1319 Other dysphagia: Secondary | ICD-10-CM | POA: Diagnosis not present

## 2021-06-06 DIAGNOSIS — Z9981 Dependence on supplemental oxygen: Secondary | ICD-10-CM

## 2021-06-06 DIAGNOSIS — R1013 Epigastric pain: Secondary | ICD-10-CM

## 2021-06-06 DIAGNOSIS — K5909 Other constipation: Secondary | ICD-10-CM

## 2021-06-06 LAB — GAMMA GT: GGT: 119 U/L — ABNORMAL HIGH (ref 7–51)

## 2021-06-06 LAB — PROTIME-INR
INR: 1.1 ratio — ABNORMAL HIGH (ref 0.8–1.0)
Prothrombin Time: 11.6 s (ref 9.6–13.1)

## 2021-06-06 LAB — COMPREHENSIVE METABOLIC PANEL
ALT: 44 U/L (ref 0–53)
AST: 26 U/L (ref 0–37)
Albumin: 3.7 g/dL (ref 3.5–5.2)
Alkaline Phosphatase: 120 U/L — ABNORMAL HIGH (ref 39–117)
BUN: 15 mg/dL (ref 6–23)
CO2: 25 mEq/L (ref 19–32)
Calcium: 9.5 mg/dL (ref 8.4–10.5)
Chloride: 104 mEq/L (ref 96–112)
Creatinine, Ser: 1.46 mg/dL (ref 0.40–1.50)
GFR: 46.23 mL/min — ABNORMAL LOW (ref >60.00)
Glucose, Bld: 98 mg/dL (ref 70–99)
Potassium: 3.9 mEq/L (ref 3.5–5.1)
Sodium: 137 mEq/L (ref 135–145)
Total Bilirubin: 0.7 mg/dL (ref 0.2–1.2)
Total Protein: 7.3 g/dL (ref 6.0–8.3)

## 2021-06-06 LAB — CK: Total CK: 49 U/L (ref 7–232)

## 2021-06-06 MED ORDER — ESOMEPRAZOLE MAGNESIUM 40 MG PO CPDR: 40.0000 mg | DELAYED_RELEASE_CAPSULE | Freq: Every day | ORAL | 2 refills | Status: DC

## 2021-06-06 NOTE — Patient Instructions (Signed)
You will be contacted by [facility] in the next 2 days to arrange a Barium Swallow.  The number on your caller ID will be [phone], please answer when they call.  If you have not heard from them in 2 days please call [phone] to schedule.    You have been scheduled for a Barium Esophogram at [facility] (1st floor of the hospital) on ____________ at ______________. Please arrive 15 minutes prior to your appointment for registration. Make certain not to have anything to eat or drink 3 hours prior to your test. If you need to reschedule for any reason, please contact radiology at [phone] to do so. __________________________________________________________________ A barium swallow is an examination that concentrates on views of the esophagus. This tends to be a double contrast exam (barium and two liquids which, when combined, create a gas to distend the wall of the oesophagus) or single contrast (non-ionic iodine based). The study is usually tailored to your symptoms so a good history is essential. Attention is paid during the study to the form, structure and configuration of the esophagus, looking for functional disorders (such as aspiration, dysphagia, achalasia, motility and reflux) EXAMINATION You may be asked to change into a gown, depending on the type of swallow being performed. A radiologist and radiographer will perform the procedure. The radiologist will advise you of the type of contrast selected for your procedure and direct you during the exam. You will be asked to stand, sit or lie in several different positions and to hold a small amount of fluid in your mouth before being asked to swallow while the imaging is performed .In some instances you may be asked to swallow barium coated marshmallows to assess the motility of a solid food bolus. The exam can be recorded as a digital or video fluoroscopy procedure. POST PROCEDURE It will take 1-2 days for the  barium to pass through your system. To facilitate this, it is important, unless otherwise directed, to increase your fluids for the next 24-48hrs and to resume your normal diet.  This test typically takes about 30 minutes to perform. ______________________________________________________________________  We have sent the following medications to your pharmacy for you to pick up at your convenience: Nexium    Your provider has requested that you go to the basement level for lab work before leaving today. Press "B" on the elevator. The lab is located at the first door on the left as you exit the elevator.  If you are age 77 or older, your body mass index should be between 23-30. Your Body mass index is 31.48 kg/m. If this is out of the aforementioned range listed, please consider follow up with your Primary Care Provider  __________________________________________________________  The [place] [facility] providers would like to encourage you to use [facility] to communicate with providers for non-urgent requests or questions.  Due to long hold times on the telephone, sending your provider a message by [facility] may be a faster and more efficient way to get a response.    Thank you for choosing me and [facility].  Dr. Meridee Score

## 2021-06-06 NOTE — Progress Notes (Signed)
[facility] VISIT   Primary Care Provider Lorrene Reid, PA-C [place] [address] [phone]  Referring Provider Lorrene Reid, PA-C [place],  [place] [address] [phone]  Patient Profile: Michael [name] is a 75 y.o. 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  The patient presents to the [facility] for an evaluation and management of problem(s) noted below:  Problem List 1. Abdominal pain, chronic, epigastric   2. Hematemesis with nausea   3. Esophageal dysphagia   4. Unintentional weight loss   5. Abnormal LFTs   6. Chronic constipation   7. Long term (current) use of antithrombotics/antiplatelets   8. Requires continuous at home supplemental oxygen     History of Present Illness This is the patient's first visit to the outpatient [facility] clinic.  He is accompanied by his daughter today.  He has never seen a gastroenterologist in the past.  He has never had an upper or lower endoscopy.  He has done Cologuard testing in the past.  The patient has chronic constipation and mostly goes on an every other day basis.  He does not need to use laxatives most of the time but can if necessary.  The biggest issue is that since December of last year he has had abdominal pain.  This abdominal pain is in the upper abdomen and it comes and goes.  He feels a sour stomach for periods of time that can last a few days to weeks and then stop.  He has dealt with indigestion and heartburn in the past but feels this is different.  He has used Tums and Pepto-Bismol in the past with some improvement but it has not helped his abdominal pain issues.  More recently he had an episode that is now abated but he remains significantly  concerned with what is going on.  He does have an appetite and denies early satiety however.  He has during his episodes of nausea with vomiting.  He has vomited blood on 1 occasion in this last year which he believes was a result of dry retching and then a vomitus with blood.  He does not have that frequently however.  He has had some difficulty with swallowing solids for a few years.  He has lost 10 pounds within the last 2 months.  The patient had recent liver biochemical testing performed with significant transferase elevation.  He does not drink any alcohol at this time but previously did drink years ago.  No family history of liver disease.  No new medications have been initiated.  The PCP visit suggested that he had been initiated on PPI but he is not taking any PPI.  GI Review of Systems Positive as above Negative for odynophagia, melena, hematochezia  Review of Systems General: Denies fevers/chills HEENT: Denies oral lesions Cardiovascular: Denies chest pain/palpitations Pulmonary: Stable shortness of breath Gastroenterological: See HPI Genitourinary: Denies darkened urine or hematuria Hematological: Positive for history of easy bruising/bleeding due to Plavix Endocrine: Denies temperature intolerance Dermatological: Denies jaundice Psychological: Mood is anxious to get better   Medications Current Outpatient Medications  Medication Sig Dispense Refill   atorvastatin (LIPITOR) 20 MG tablet TAKE 1 TABLET BY MOUTH DAILY ALTERNATINGWITH 2 TABLETS EVERY OTHER DAY AT BEDTIME (Patient taking differently: Take 20-40 mg by mouth See  admin instructions. Take 20 mg by mouth alternating with 40 mg tablets every other day.) 180 tablet 3   clopidogrel (PLAVIX) 75 MG tablet TAKE 1 TABLET BY MOUTH DAILY 90 tablet 3   esomeprazole (NEXIUM) 40 MG capsule Take 1 capsule (40 mg total) by mouth daily. 30 capsule 2   fenofibrate (TRICOR) 48 MG tablet TAKE 1 TABLET BY MOUTH DAILY 90 tablet 3    levothyroxine (SYNTHROID) 75 MCG tablet TAKE 1 TABLET BY MOUTH DAILY 90 tablet 0   metoprolol tartrate (LOPRESSOR) 25 MG tablet TAKE 1 TABLET BY MOUTH TWICE DAILY (Patient taking differently: Take 25 mg by mouth 2 (two) times daily.) 180 tablet 3   nitroGLYCERIN (NITROSTAT) 0.4 MG SL tablet Place 1 tablet (0.4 mg total) under the tongue every 5 (five) minutes as needed. After 3 doses, if no relief dial 911 30 tablet 0   OXYGEN Inhale into the lungs.     Vitamin D, Ergocalciferol, (DRISDOL) 1.25 MG (50000 UNIT) CAPS capsule TAKE 1 CAPSULE BY MOUTH EVERY 7 DAYS 12 capsule 3   No current facility-administered medications for this visit.    Allergies Allergies  Allergen Reactions   Ace Inhibitors Cough    Pt reports he is not allergic to this 08/03/2020    Histories Past Medical History:  Diagnosis Date   Arthritis    "right hip; right wrist" (11/24/2016)   CAD, multiple vessel 05/2011   90% mid LAD with diffuse proximal stenosis, 100% proximal RCA --> s/p CABG x 2 (LIMA-LAD, SVG-dRCA); b) Lexiscan Myoview 09/2016 w/ Inferior-Inferolateral defect -> Cath:  11/24/16: 100% CTO of SVG-RCA (but nagtive /RCA only subtotal occlusion) --> PCI with DES x2 (resolute onyx DES 2.25 mm x 30 mm, 2.5 mm x 20 mm) RCA   Carotid artery occlusion    COPD (chronic obstructive pulmonary disease) with emphysema (HCC)    Essential hypertension    History of gout    History of tobacco abuse    quit 05/2011   Hyperlipidemia with target LDL less than 70    Hypothyroidism    MI (myocardial infarction) ([place]) 05/2011   "I had 3 in 2 days"   NSTEMI (non-ST elevated myocardial infarction) ([place]) 06/05/2011   With CHF - probably late presentation for missed STEMI --> CATH- MV CAD;; Echo 07/2001: EF > 55%, mild MAC, mild TR, no WMA  with Post-op Setpal dyskinesis, Apex not seen)   PAD (peripheral artery disease) ([place]) 06/08/2011   a) R Common Iliac 100%, L Common Iliac 80+% occlusion; b) L CIA - Aterectomy- 10 mm x 40 mm  self-extending Stent (postdilated to 7 mm with 20-30% residual)--> Deferred R AoFem bypass;; c) LEA Dopplers 04/2012 & 04/2014 - RABI 0.8 & LABI 1.1 - STABLE, no change   Pneumonia 1940s X 1   Presence of drug coated stent in right coronary artery 11/24/2016   p-mRCA: Resolute Onyx DES 2.25 mm x 38 mm --> 2.5 mm x 18 mm (post-dilated from 2.7-2.8 mm)   Past Surgical History:  Procedure Laterality Date   CORONARY ANGIOPLASTY WITH STENT PLACEMENT  11/24/2016   with 100% SVG-RCA & flow in native RCA now present --> PCI-p-m RCA 2 Overlapping Resolute DES (2.25 x 38, 1.5 x 18)   CORONARY ARTERY BYPASS GRAFT  06/10/2011   oLIMA to LAD, SVG to PDA (Dr. Servando Snare)   [place] N/A 11/24/2016   Procedure: Coronary Stent Intervention;  Surgeon: Leonie Man, MD;  Location: [facility] CV LAB: DES RESOLUTE  ONYX [id number] & overlapping DES RESOLUTE ONYX 2.5X18    ILIAC ARTERY STENT  10/27/2011   (Dr. Ellyn Hack Dr. Gwenlyn Found) left CIA - 34mx40mm self-expanding stent, decreasing downt to 20-30% lesion, 100% occluded R CIA with extensive lumbar collateralization to R internal iliac;  05/2016: Ao-Ileac & LEA Dopplers: RABI 0.8, LABI 1.2. B TBI 0.77 -- he has known B disease s/p L CIA stent. RLABI decreased and LABI is normal; aortoiliac atherosclerosis w/ CTO of R CIA but normal REIA.   LEFT HEART CATH AND CORONARY ANGIOGRAPHY  06/09/2011   90% mid LAD with diffuse proximal stenosis, 100% proximal RCA --> s/p CABG x 2 (LIMA-LAD, SVG-dRCA);   LEFT HEART CATH AND CORS/GRAFTS ANGIOGRAPHY N/A 11/24/2016   Procedure: LEFT HEART CATH AND CORS/GRAFTS ANGIOGRAPHY;  Surgeon: DLeonie Man MD;  [facility]: 100% SVG-RCA, but now native RCA only sub-CTO with antegrade flow.  85% mLAD after D1. 40% oOM1 --> PCI of Native RCA   NM MYOVIEW LTD  09/2016    EF 62%. Medium sized, moderate severity defect in the basal inferior, mid inferoseptal and mid inferior walls is partially reversible. -  INTERMEDIATE RISK -- CATH   PERCUTANEOUS STENT INTERVENTION Left 10/27/2011   Procedure: PERCUTANEOUS STENT INTERVENTION;  Surgeon: JLorretta Harp MD / DLeonie Man MD;  [facility];  Service: Cardiovascular;  Laterality: Left;   TONSILLECTOMY  ~ 1963   TRANSTHORACIC ECHOCARDIOGRAM  08/19/2011   EF=>55%; mild mitral annular calcif; mild TR   Social History   Socioeconomic History   Marital status: Divorced    Spouse name: Not on file   Number of children: 3   Years of education: Not on file   Highest education level: Not on file  Occupational History   Not on file  Tobacco Use   Smoking status: Former    Packs/day: 2.00    Years: 51.50    Pack years: 103.00    Types: Cigarettes    Quit date: 06/08/2011    Years since quitting: 10.0   Smokeless tobacco: Former    Types: Chew    Quit date: 07/13/2003  Vaping Use   Vaping Use: Never used  Substance and Sexual Activity   Alcohol use: No    Comment: 11/24/2016 "nothing in 2 1/2 years; never an alcoholic"   Drug use: No   Sexual activity: Never  Other Topics Concern   Not on file  Social History Narrative   Not on file   Social Determinants of Health   Financial Resource Strain: Not on file  Food Insecurity: Not on file  Transportation Needs: Not on file  Physical Activity: Not on file  Stress: Not on file  Social Connections: Not on file  Intimate Partner Violence: Not on file   Family History  Problem Relation Age of Onset   Heart disease Mother        valve replacement   Diabetes Mother    Heart failure Mother    Pneumonia Father    CAD Sister        cardiac stent   Stroke Sister        stents in neck   Colon cancer Neg Hx    Esophageal cancer Neg Hx    Inflammatory bowel disease Neg Hx    Liver disease Neg Hx    Pancreatic cancer Neg Hx    Rectal cancer Neg Hx    Stomach cancer Neg Hx    I have reviewed  his medical, social, and family history in detail and updated the electronic medical  record as necessary.    PHYSICAL EXAMINATION  BP 124/80   Pulse 66   Ht _0  (1.702 m)   Wt 201 lb (91.2 kg)   BMI 31.48 kg/m  Wt Readings from Last 3 Encounters:  06/06/21 201 lb (91.2 kg)  05/23/21 198 lb 11.2 oz (90.1 kg)  01/21/21 208 lb 6.4 oz (94.5 kg)  GEN: NAD, appears older than stated age, nontoxic, accompanied by daughter PSYCH: Cooperative, without pressured speech EYE: Conjunctivae pink, sclerae anicteric ENT: Nasal cannula in place, MMM  CV: Nontachycardic RESP: Wheezing appreciated GI: NABS, soft, NT/ND, without rebound or guarding MSK/EXT: Lower extremity edema bilaterally SKIN: No jaundice NEURO:  Alert & Oriented x 3, no focal deficits   REVIEW OF DATA  I reviewed the following data at the time of this encounter:  GI Procedures and Studies  June 2022 Cologuard negative  Laboratory Studies    01/30/2020 08:57 08/03/2020 15:37 08/04/2020 05:14 01/21/2021 09:59 02/06/2021 09:00 05/23/2021 10:54  AST 32 27 25 148 (H) 22 131 (H)  ALT 39 30 25 207 (H) 24 108 (H)  Total Bilirubin 0.3 0.8 1.1 1.5 (H) 0.6 1.0  Bilirubin, Direct  0.2      Indirect Bilirubin  0.6      Alkaline Phosphatase 60 54 42 87 68 268 (H)    Imaging Studies  December 2021 right upper quadrant ultrasound IMPRESSION: 1. Distended gallbladder with possible sludge versus artifact. No gallstones or sonographic findings of acute cholecystitis. Examination was technically difficult and limited due to patient's anatomy and difficulty tolerating the exam. If there is persistent clinical concern for acute cholecystitis, recommend nuclear medicine hepatic biliary scan. 2. No biliary dilatation.  December 2021 CT chest/abdomen/pelvis IMPRESSION: 1. No evidence for aortic dissection or aneurysm. 2. There is fat stranding about the gallbladder with mild distention. Further evaluation with ultrasound is recommended. 3. Rectosigmoid diverticulosis without acute inflammation. 4. 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   ASSESSMENT  Mr. Teschner is a 75 y.o. 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  The patient is seen today for evaluation and management of:  1. Abdominal pain, chronic, epigastric   2. Hematemesis with nausea   3. Esophageal dysphagia   4. Unintentional weight loss   5. Abnormal LFTs   6. Chronic constipation   7. Long term (current) use of antithrombotics/antiplatelets   8. Requires continuous at home supplemental oxygen    The patient is hemodynamically stable.  Clinically however he has significant symptoms and red flag symptoms that suggest the need for an endoscopy sooner rather than later.  He is a very high risk individual due to his underlying COPD and home O2 use that I want to get as much information before we put him under anesthesia for an endoscopy and possible dilation.  We can initiate the patient on PPI therapy.  We are going to obtain a barium swallow and we are going to work on getting him scheduled in the coming weeks for a hospital-based EGD.  In regards to his abnormal LFTs he has fluctuated over the course of the last year and we will begin a basic serological work-up and plan repeat ultrasound imaging if the LFTs remain abnormal.  The risks and benefits of  endoscopic evaluation were discussed with the patient; these include but are not limited to the risk of perforation, infection, bleeding, missed lesions, lack of diagnosis, severe illness requiring hospitalization, as well as anesthesia and sedation related illnesses.  The patient and/or family is agreeable to proceed when we feel it is safe.  We will need to get cardiology approval to hold Plavix for 5 days prior to procedure.  All patient questions were answered to the best of my ability, and the  patient agrees to the aforementioned plan of action with follow-up as indicated.   PLAN  Laboratories as outlined below If LFTs remain abnormal then abdominal ultrasound to be obtained Liver biopsy may be necessary pending further serological work-up Barium esophagram to be obtained Proceed with scheduling EGD with biopsies and dilation in hospital-based setting Obtain approval for Plavix hold for 5 days Initiate Nexium 40 mg daily   Orders Placed This Encounter  Procedures   Procedural/ Surgical Case Request: ESOPHAGOGASTRODUODENOSCOPY (EGD) WITH PROPOFOL   Helicobacter pylori special antigen   DG ESOPHAGUS W SINGLE CM (SOL OR THIN BA)   Comp Met (CMET)   Tissue transglutaminase, IgA   INR/PT   ANA   Mitochondrial antibodies   Anti-smooth muscle antibody, IgG   Hepatitis A antibody, total   Hepatitis B Surface AntiGEN   Hepatitis B Surface AntiBODY   Hepatitis B Core Antibody, total   Hepatitis C Antibody   CK (Creatine Kinase)   Gamma GT   IgA   Ambulatory referral to Gastroenterology    New Prescriptions   ESOMEPRAZOLE (NEXIUM) 40 MG CAPSULE    Take 1 capsule (40 mg total) by mouth daily.   Modified Medications   No medications on file    Planned Follow Up No follow-ups on file.   Total Time in Face-to-Face and in Coordination of Care for patient including independent/personal interpretation/review of prior testing, medical history, examination, medication adjustment, communicating results with the patient directly, and documentation within the EHR is 45 minutes.   Justice Britain, MD [facility] Advanced [facility]oscopy Office # [phone]

## 2021-06-07 ENCOUNTER — Encounter: Payer: Self-pay | Admitting: Gastroenterology

## 2021-06-07 DIAGNOSIS — R7989 Other specified abnormal findings of blood chemistry: Secondary | ICD-10-CM | POA: Insufficient documentation

## 2021-06-07 DIAGNOSIS — K920 Hematemesis: Secondary | ICD-10-CM | POA: Insufficient documentation

## 2021-06-07 DIAGNOSIS — R634 Abnormal weight loss: Secondary | ICD-10-CM | POA: Insufficient documentation

## 2021-06-07 DIAGNOSIS — Z7902 Long term (current) use of antithrombotics/antiplatelets: Secondary | ICD-10-CM | POA: Insufficient documentation

## 2021-06-07 DIAGNOSIS — G8929 Other chronic pain: Secondary | ICD-10-CM | POA: Insufficient documentation

## 2021-06-07 DIAGNOSIS — K5909 Other constipation: Secondary | ICD-10-CM | POA: Insufficient documentation

## 2021-06-07 DIAGNOSIS — R1319 Other dysphagia: Secondary | ICD-10-CM | POA: Insufficient documentation

## 2021-06-07 DIAGNOSIS — Z9981 Dependence on supplemental oxygen: Secondary | ICD-10-CM | POA: Insufficient documentation

## 2021-06-09 ENCOUNTER — Other Ambulatory Visit: Payer: Medicare HMO

## 2021-06-09 DIAGNOSIS — K920 Hematemesis: Secondary | ICD-10-CM | POA: Diagnosis not present

## 2021-06-09 DIAGNOSIS — R1319 Other dysphagia: Secondary | ICD-10-CM | POA: Diagnosis not present

## 2021-06-09 DIAGNOSIS — R7989 Other specified abnormal findings of blood chemistry: Secondary | ICD-10-CM | POA: Diagnosis not present

## 2021-06-09 DIAGNOSIS — R634 Abnormal weight loss: Secondary | ICD-10-CM

## 2021-06-10 LAB — ANTI-SMOOTH MUSCLE ANTIBODY, IGG: Actin (Smooth Muscle) Antibody (IGG): <20 U (ref <20)

## 2021-06-10 LAB — HEPATITIS C ANTIBODY
Hepatitis C Ab: NONREACTIVE
SIGNAL TO CUT-OFF: 0.01 (ref <1.00)

## 2021-06-10 LAB — ANA: Anti Nuclear Antibody (ANA): POSITIVE — AB

## 2021-06-10 LAB — HEPATITIS A ANTIBODY, TOTAL: Hepatitis A AB,Total: NONREACTIVE

## 2021-06-10 LAB — HEPATITIS B SURFACE ANTIBODY,QUALITATIVE: Hep B S Ab: NONREACTIVE

## 2021-06-10 LAB — HELICOBACTER PYLORI  SPECIAL ANTIGEN
MICRO NUMBER:: 12511330
RESULT:: DETECTED — AB
SPECIMEN QUALITY: ADEQUATE

## 2021-06-10 LAB — HEPATITIS B CORE ANTIBODY, TOTAL: Hep B Core Total Ab: NONREACTIVE

## 2021-06-10 LAB — IGA: Immunoglobulin A: 325 mg/dL — ABNORMAL HIGH (ref 70–320)

## 2021-06-10 LAB — HEPATITIS B SURFACE ANTIGEN: Hepatitis B Surface Ag: NONREACTIVE

## 2021-06-10 LAB — MITOCHONDRIAL ANTIBODIES: Mitochondrial M2 Ab, IgG: <=20 U (ref <=20.0)

## 2021-06-10 LAB — TISSUE TRANSGLUTAMINASE, IGA: (tTG) Ab, IgA: <1 U/mL

## 2021-06-10 LAB — ANTI-NUCLEAR AB-TITER (ANA TITER): ANA Titer 1: 1:40 {titer} — ABNORMAL HIGH

## 2021-06-11 ENCOUNTER — Other Ambulatory Visit: Payer: Self-pay

## 2021-06-11 MED ORDER — PYLERA 140-125-125 MG PO CAPS: 3.0000 | ORAL_CAPSULE | Freq: Three times a day (TID) | ORAL | 0 refills | Status: DC

## 2021-06-15 NOTE — Progress Notes (Signed)
Cardiology Clinic Note   Patient Name: Chaynce Schafer Date of Encounter: 06/17/2021  Primary Care Provider:  Lorrene Reid, PA-C Primary Cardiologist:  Glenetta Hew, MD  Patient Profile    Con Arganbright 75 year old male presents to the clinic today for follow-up evaluation of his coronary artery disease and essential hypertension.  Past Medical History    Past Medical History:  Diagnosis Date   Arthritis    "right hip; right wrist" (11/24/2016)   CAD, multiple vessel 05/2011   90% mid LAD with diffuse proximal stenosis, 100% proximal RCA --> s/p CABG x 2 (LIMA-LAD, SVG-dRCA); b) Lexiscan Myoview 09/2016 w/ Inferior-Inferolateral defect -> Cath:  11/24/16: 100% CTO of SVG-RCA (but nagtive /RCA only subtotal occlusion) --> PCI with DES x2 (resolute onyx DES 2.25 mm x 30 mm, 2.5 mm x 20 mm) RCA   Carotid artery occlusion    COPD (chronic obstructive pulmonary disease) with emphysema (HCC)    Essential hypertension    History of gout    History of tobacco abuse    quit 05/2011   Hyperlipidemia with target LDL less than 70    Hypothyroidism    MI (myocardial infarction) ([place]) 05/2011   "I had 3 in 2 days"   NSTEMI (non-ST elevated myocardial infarction) ([place]) 06/05/2011   With CHF - probably late presentation for missed STEMI --> CATH- MV CAD;; Echo 07/2001: EF > 55%, mild MAC, mild TR, no WMA  with Post-op Setpal dyskinesis, Apex not seen)   PAD (peripheral artery disease) ([place]) 06/08/2011   a) R Common Iliac 100%, L Common Iliac 80+% occlusion; b) L CIA - Aterectomy- 10 mm x 40 mm self-extending Stent (postdilated to 7 mm with 20-30% residual)--> Deferred R AoFem bypass;; c) LEA Dopplers 04/2012 & 04/2014 - RABI 0.8 & LABI 1.1 - STABLE, no change   Pneumonia 1940s X 1   Presence of drug coated stent in right coronary artery 11/24/2016   p-mRCA: Resolute Onyx DES 2.25 mm x 38 mm --> 2.5 mm x 18 mm (post-dilated from 2.7-2.8 mm)   Past Surgical History:  Procedure Laterality Date    CORONARY ANGIOPLASTY WITH STENT PLACEMENT  11/24/2016   with 100% SVG-RCA & flow in native RCA now present --> PCI-p-m RCA 2 Overlapping Resolute DES (2.25 x 38, 1.5 x 18)   CORONARY ARTERY BYPASS GRAFT  06/10/2011   oLIMA to LAD, SVG to PDA (Dr. Servando Snare)   CORONARY STENT INTERVENTION N/A 11/24/2016   Procedure: Coronary Stent Intervention;  Surgeon: Leonie Man, MD;  Location: [facility]: DES RESOLUTE ONYX 2.25X38 & overlapping DES RESOLUTE ONYX 2.5X18    ILIAC ARTERY STENT  10/27/2011   (Dr. Ellyn Hack Dr. Gwenlyn Found) left CIA - 36mx40mm self-expanding stent, decreasing downt to 20-30% lesion, 100% occluded R CIA with extensive lumbar collateralization to R internal iliac;  05/2016: Ao-Ileac & LEA Dopplers: RABI 0.8, LABI 1.2. B TBI 0.77 -- he has known B disease s/p L CIA stent. RLABI decreased and LABI is normal; aortoiliac atherosclerosis w/ CTO of R CIA but normal REIA.   LEFT HEART CATH AND CORONARY ANGIOGRAPHY  06/09/2011   90% mid LAD with diffuse proximal stenosis, 100% proximal RCA --> s/p CABG x 2 (LIMA-LAD, SVG-dRCA);   LEFT HEART CATH AND CORS/GRAFTS ANGIOGRAPHY N/A 11/24/2016   Procedure: LEFT HEART CATH AND CORS/GRAFTS ANGIOGRAPHY;  Surgeon: DLeonie Man MD;  [facility]: 100% SVG-RCA, but now native RCA only sub-CTO with antegrade flow.  85% mLAD after  D1. 40% oOM1 --> PCI of Native RCA   NM MYOVIEW LTD  09/2016    EF 62%. Medium sized, moderate severity defect in the basal inferior, mid inferoseptal and mid inferior walls is partially reversible. - INTERMEDIATE RISK -- CATH   PERCUTANEOUS STENT INTERVENTION Left 10/27/2011   Procedure: PERCUTANEOUS STENT INTERVENTION;  Surgeon: Lorretta Harp, MD / Leonie Man, MD;  Location: [facility] CATH LAB;  Service: Cardiovascular;  Laterality: Left;   TONSILLECTOMY  ~ 1963   TRANSTHORACIC ECHOCARDIOGRAM  08/19/2011   EF=>55%; mild mitral annular calcif; mild TR    Allergies  Allergies  Allergen Reactions   Ace  Inhibitors Cough    Pt reports he is not allergic to this 08/03/2020    History of Present Illness    Sierra Bissonette has a PMH of coronary artery disease, HTN, PAD, NSTEMI, COPD 3 L nasal cannula, hypothyroidism, HOH, CKD stage III, HLD, prediabetes, vitamin D deficiency, and abnormal LFTs.  He is status post CABG LIMA-LAD and SVG-RPDA 2012.  Cardiac catheterization 4/18 showed P-M LAD 65% after D1 100% patent LIMA-LAD, 40% OM1, SVG-RPDA 100% occluded, proximal RCA lesion 70%, mid RCA lesion 99%, mid RCA lesion 2 65%.  He received PCI with DES x2 to his proximal and mid RCA.  He was last seen by Dr. Ellyn Hack on 06/14/2020.  During that time he reported that he was following with Dr. Lavone Neri for pulmonary medicine.  He reported that he did not note much benefit from his inhalers but did not really try them.  He reported that he would like to have a portable home oxygen tank.  He remained stable from a cardiac standpoint but reported he was limited due to his breathing.  He was sedentary and mainly staying at home.  He noted increased work of breathing with activities such as walking from his garage to get his lawnmower.  He denied claudication.  He did low intensity yard work.  He was noted to be short of breath with conversation.  He presents the clinic today for follow-up evaluation and states he has been having trouble with a stomach/bowel infection.  His GI doctors not sure whether it is related to his stomach or his bowels.  He reports that he has a barium swallow test scheduled for Friday.  He is hopeful that he will not have to have an EGD for further evaluation.  He denies chest discomfort and shortness of breath.  He is now using 5 L nasal cannula and saturating in the mid 90s.  He presents with his granddaughter today.  We reviewed his previous angiography and he expressed understanding.  I will give him a salty 6 diet sheet, have him increase his physical activity as tolerated and plan follow-up in 1  year.  Today he denies chest pain, shortness of breath, lower extremity edema, fatigue, palpitations, melena, hematuria, hemoptysis, diaphoresis, weakness, presyncope, syncope, orthopnea, and PND.   Home Medications    Prior to Admission medications   Medication Sig Start Date End Date Taking? Authorizing Provider  atorvastatin (LIPITOR) 20 MG tablet TAKE 1 TABLET BY MOUTH DAILY ALTERNATINGWITH 2 TABLETS EVERY OTHER DAY AT BEDTIME Patient taking differently: Take 20-40 mg by mouth See admin instructions. Take 20 mg by mouth alternating with 40 mg tablets every other day. 06/25/20   Leonie Man, MD  bismuth-metronidazole-tetracycline [facility]) [phone] MG capsule Take 3 capsules by mouth 4 (four) times daily -  before meals and at bedtime for 10 days. 06/11/21  06/21/21  Mansouraty, Telford Nab., MD  clopidogrel (PLAVIX) 75 MG tablet TAKE 1 TABLET BY MOUTH DAILY 07/16/20   Leonie Man, MD  esomeprazole (NEXIUM) 40 MG capsule Take 1 capsule (40 mg total) by mouth daily. 06/06/21   Mansouraty, Telford Nab., MD  fenofibrate (TRICOR) 48 MG tablet TAKE 1 TABLET BY MOUTH DAILY 08/20/20   Leonie Man, MD  levothyroxine (SYNTHROID) 75 MCG tablet TAKE 1 TABLET BY MOUTH DAILY 05/16/21   Lorrene Reid, PA-C  metoprolol tartrate (LOPRESSOR) 25 MG tablet TAKE 1 TABLET BY MOUTH TWICE DAILY Patient taking differently: Take 25 mg by mouth 2 (two) times daily. 07/10/20   Leonie Man, MD  nitroGLYCERIN (NITROSTAT) 0.4 MG SL tablet Place 1 tablet (0.4 mg total) under the tongue every 5 (five) minutes as needed. After 3 doses, if no relief dial 911 03/09/19   Opalski, Deborah, DO  OXYGEN Inhale into the lungs.    [provider]  Vitamin D, Ergocalciferol, (DRISDOL) 1.25 MG (50000 UNIT) CAPS capsule TAKE 1 CAPSULE BY MOUTH EVERY 7 DAYS 08/06/20   Lorrene Reid, PA-C    Family History    Family History  Problem Relation Age of Onset   Heart disease Mother        valve replacement    Diabetes Mother    Heart failure Mother    Pneumonia Father    CAD Sister        cardiac stent   Stroke Sister        stents in neck   Colon cancer Neg Hx    Esophageal cancer Neg Hx    Inflammatory bowel disease Neg Hx    Liver disease Neg Hx    Pancreatic cancer Neg Hx    Rectal cancer Neg Hx    Stomach cancer Neg Hx    He indicated that his mother is deceased. He indicated that his father is deceased. He indicated that the status of his neg hx is unknown.  Social History    Social History   Socioeconomic History   Marital status: Divorced    Spouse name: Not on file   Number of children: 3   Years of education: Not on file   Highest education level: Not on file  Occupational History   Not on file  Tobacco Use   Smoking status: Former    Packs/day: 2.00    Years: 51.50    Pack years: 103.00    Types: Cigarettes    Quit date: 06/08/2011    Years since quitting: 10.0   Smokeless tobacco: Former    Types: Chew    Quit date: 07/13/2003  Vaping Use   Vaping Use: Never used  Substance and Sexual Activity   Alcohol use: No    Comment: 11/24/2016 "nothing in 2 1/2 years; never an alcoholic"   Drug use: No   Sexual activity: Never  Other Topics Concern   Not on file  Social History Narrative   Not on file   Social Determinants of Health   Financial Resource Strain: Not on file  Food Insecurity: Not on file  Transportation Needs: Not on file  Physical Activity: Not on file  Stress: Not on file  Social Connections: Not on file  Intimate Partner Violence: Not on file     Review of Systems    General:  No chills, fever, night sweats or weight changes.  Cardiovascular:  No chest pain, dyspnea on exertion, edema, orthopnea, palpitations, paroxysmal nocturnal dyspnea. Dermatological:  No rash, lesions/masses Respiratory: No cough, dyspnea Urologic: No hematuria, dysuria Abdominal:   No nausea, vomiting, diarrhea, bright red blood per rectum, melena, or  hematemesis Neurologic:  No visual changes, wkns, changes in mental status. All other systems reviewed and are otherwise negative except as noted above.  Physical Exam    VS:  BP 120/80   Pulse 90   Ht _0  (1.702 m)   Wt 199 lb 9.6 oz (90.5 kg)   SpO2 93%   BMI 31.26 kg/m  , BMI Body mass index is 31.26 kg/m. GEN: Well nourished, well developed, in no acute distress. HEENT: normal. Neck: Supple, no JVD, carotid bruits, or masses. Cardiac: RRR, no murmurs, rubs, or gallops. No clubbing, cyanosis, edema.  Radials/DP/PT 2+ and equal bilaterally.  Respiratory:  Respirations regular and unlabored, clear to auscultation bilaterally. GI: Soft, nontender, nondistended, BS + x 4. MS: no deformity or atrophy. Skin: warm and dry, no rash. Neuro:  Strength and sensation are intact. Psych: Normal affect.  Accessory Clinical Findings    Recent Labs: 08/04/2020: Magnesium 1.7 05/23/2021: Hemoglobin 13.4; Platelets 507; TSH 4.370 06/06/2021: ALT 44; BUN 15; Creatinine, Ser 1.46; Potassium 3.9; Sodium 137   Recent Lipid Panel    Component Value Date/Time   CHOL 143 01/21/2021 0959   TRIG 121 01/21/2021 0959   HDL 41 01/21/2021 0959   CHOLHDL 3.5 01/21/2021 0959   CHOLHDL 4.8 05/16/2013 1016   VLDL 61 (H) 05/16/2013 1016   LDLCALC 80 01/21/2021 0959    ECG personally reviewed by me today-normal sinus rhythm incomplete right bundle branch block inferior infarct undetermined age 25 bpm- No acute changes   Cardiac catheterization 4/0/9811  LV end diastolic pressure is normal. The left ventricular ejection fraction is 55-65% by visual estimate. The left ventricular systolic function is normal. ________________________________ Prox LAD to Mid LAD lesion, 65 %stenosed after D1. Then Mid LAD lesion, 100 %stenosed. Distal LAD fills via LIMA LIMA-LAD and is normal in caliber and anatomically normal. SVG-RPDA: Origin to Prox Graft lesion, 100 %stenosed. Prox RCA lesion, 70 %stenosed. Mid  RCA-1 lesion, 99 %stenosed. Mid RCA-2 lesion, 65 %stenosed. A STENT RESOLUTE ONYX [id number] drug eluting stent was successfully placed. A STENT RESOLUTE ONYX 2.5X18 drug eluting stent was successfully placed, and overlaps previously placed stent. Post intervention, there is a 0% residual stenosis.   Successful revascularization of the previously occluded RCA using 2 overlapping DES stents tapered from proximal to distal.   Otherwise stable circumflex, proximal LAD to Diag1 and patent LIMA-LAD.   Plan: Based on his multiple comorbidities, I did not feel that he was good candidate for same-day discharge. He will therefore be monitored overnight with IV hydration. He will restart aspirin and then change his Plavix back to daily. - Can discuss stopping aspirin at 1-3 months Anticipate discharge tomorrow on current home medications with adjustments noted.     He will f/u with me or APP post-cath.     Glenetta Hew, M.D., M.S.  Diagnostic Dominance: Co-dominant Intervention    Assessment & Plan   1.  Coronary artery disease-denies recent episodes of arm neck back or chest discomfort.  Status post CABG times 09/2010.  [place] 2018 with PCI and DES x2 to his proximal and mid RCA. Continue atorvastatin, Plavix, metoprolol Heart health low-sodium diet-salty 6 given Increase physical activity as tolerated  Hyperlipidemia-01/21/2021: Cholesterol, Total 143; HDL 41; LDL Chol Calc (NIH) 80; Triglycerides 121 Continue atorvastatin, Plavix Heart healthy low-sodium high-fiber diet Increase physical activity as  tolerated  Peripheral arterial disease-denies claudication.  Has known occluded right iliac that is filled with collaterals. Continue atorvastatin Heart healthy low-sodium high-fiber diet Increase physical activity as tolerated Continue to monitor  Essential hypertension-BP today 120/80.  Well-controlled at home. Continue metoprolol Heart healthy low-sodium diet-salty 6 given Increase  physical activity as tolerated  COPD-remains on 5 L nasal cannula.  Oxygen saturation 93. Follows with pulmonology  Disposition: Follow-up with Dr. Ellyn Hack or me in 12 months.  Jossie Ng. Kynli Chou NP-C    06/17/2021, 11:01 AM [address] Office [phone] Fax [phone]  Notice: This dictation was prepared with Dragon dictation along with smaller phrase technology. Any transcriptional errors that result from this process are unintentional and may not be corrected upon review.  I spent 14 minutes examining this patient, reviewing medications, and using patient centered shared decision making involving her cardiac care.  Prior to her visit I spent greater than 20 minutes reviewing her past medical history,  medications, and prior cardiac tests.

## 2021-06-17 ENCOUNTER — Ambulatory Visit: Payer: Medicare HMO | Admitting: General Practice

## 2021-06-17 ENCOUNTER — Encounter: Payer: Self-pay | Admitting: General Practice

## 2021-06-17 ENCOUNTER — Other Ambulatory Visit: Payer: Self-pay

## 2021-06-17 VITALS — BP 120/80 | HR 90 | Ht 67.0 in | Wt 199.6 lb

## 2021-06-17 DIAGNOSIS — E785 Hyperlipidemia, unspecified: Secondary | ICD-10-CM | POA: Diagnosis not present

## 2021-06-17 DIAGNOSIS — I25118 Atherosclerotic heart disease of native coronary artery with other forms of angina pectoris: Secondary | ICD-10-CM | POA: Diagnosis not present

## 2021-06-17 DIAGNOSIS — J449 Chronic obstructive pulmonary disease, unspecified: Secondary | ICD-10-CM

## 2021-06-17 DIAGNOSIS — I10 Essential (primary) hypertension: Secondary | ICD-10-CM

## 2021-06-17 DIAGNOSIS — I779 Disorder of arteries and arterioles, unspecified: Secondary | ICD-10-CM

## 2021-06-17 NOTE — Patient Instructions (Signed)
Medication Instructions:  The current medical regimen is effective;  continue present plan and medications as directed. Please refer to the Current Medication list given to you today.   *If you need a refill on your cardiac medications before your next appointment, please call your pharmacy*  Lab Work:   Testing/Procedures:  NONE    NONE  Special Instructions PLEASE READ AND FOLLOW SALTY 6-ATTACHED-1,800mg  daily  PLEASE MAINTAIN PHYSICAL ACTIVITY AS TOLERATED  WE WILL AWAIT CARDIAC CLEARANCE REQUEST FOR UPCOMING PROCEDURE  Follow-Up: Your next appointment:  12 month(s) In Person with You may see Bryan Lemma, MD IF UNAVAILABLE Edd Fabian, FNP-C or one of the following Advanced Practice Providers on your designated Care Team:   Juanda Crumble, PA-C Joni Reining, DNP, ANP   Please call our office 2 months in advance to schedule this appointment   At [facility], you and your health needs are our priority.  As part of our continuing mission to provide you with exceptional heart care, we have created designated Provider Care Teams.  These Care Teams include your primary Cardiologist (physician) and Advanced Practice Providers (APPs -  Physician Assistants and Nurse Practitioners) who all work together to provide you with the care you need, when you need it.            6 SALTY THINGS TO AVOID     1,800MG  DAILY

## 2021-06-20 ENCOUNTER — Other Ambulatory Visit: Payer: Self-pay

## 2021-06-20 ENCOUNTER — Ambulatory Visit (HOSPITAL_COMMUNITY)
Admission: RE | Admit: 2021-06-20 | Discharge: 2021-06-20 | Disposition: A | Discharge status: Home / Self Care | Payer: Medicare HMO | Source: Ambulatory Visit | Attending: Gastroenterology | Admitting: Gastroenterology

## 2021-06-20 DIAGNOSIS — R7989 Other specified abnormal findings of blood chemistry: Secondary | ICD-10-CM | POA: Insufficient documentation

## 2021-06-20 DIAGNOSIS — K920 Hematemesis: Secondary | ICD-10-CM | POA: Diagnosis not present

## 2021-06-20 DIAGNOSIS — R1319 Other dysphagia: Secondary | ICD-10-CM | POA: Diagnosis not present

## 2021-06-20 DIAGNOSIS — K224 Dyskinesia of esophagus: Secondary | ICD-10-CM | POA: Diagnosis not present

## 2021-06-20 DIAGNOSIS — K449 Diaphragmatic hernia without obstruction or gangrene: Secondary | ICD-10-CM | POA: Diagnosis not present

## 2021-06-20 DIAGNOSIS — R634 Abnormal weight loss: Secondary | ICD-10-CM | POA: Diagnosis not present

## 2021-07-03 ENCOUNTER — Telehealth: Payer: Self-pay | Admitting: Cardiology

## 2021-07-03 ENCOUNTER — Encounter (HOSPITAL_COMMUNITY): Payer: Self-pay | Admitting: Gastroenterology

## 2021-07-03 NOTE — Telephone Encounter (Signed)
Returned call-spoke to daughter (ok per DPR)-procedure coming up on 11/21 and it has been requested to hold Plavix x 5 days.  Advised daughter will send to preop provider to review.     [facility] Pre-operative Risk Assessment    Patient Name: Michael [name]  DOB: Dec 06, 1945 MRN: [id number]  Request for surgical clearance:  What type of surgery is being performed? EGD  When is this surgery scheduled? 07/14/21  What type of clearance is required (medical clearance vs. Pharmacy clearance to hold med vs. Both)? pharmacy  Are there any medications that need to be held prior to surgery and how long? Plavix x 5 days  Practice name and name of physician performing surgery? [facility], Dr. Rush Landmark  What is the office phone number? [phone]   7.   What is the office fax number? [phone]  8.   Anesthesia type (None, local, MAC, general) ? Propofol   Rukia Mcgillivray A Sung Parodi 07/03/2021, 12:38 PM  _________________________________________________________________   (provider comments below)

## 2021-07-03 NOTE — Telephone Encounter (Signed)
Patient is having a procedure done on 11/21, he needs to know when he needs to stop his plavix.

## 2021-07-03 NOTE — Telephone Encounter (Signed)
    Patient Name: Michael [name]  DOB: 03-14-1946 MRN: [id number]  Primary Cardiologist: Bryan Lemma, MD  Chart reviewed as part of pre-operative protocol coverage. Hx of CAD s/p CABG 09/2010.  Last [facility] 2018 with PCI and DES x2 to his proximal and mid RCA.  Patient was doing well on cardiac stand point when recently seen 06/17/21 by Edd Fabian, NP.   Dr. Herbie Baltimore, can patient hold Plavix for 5 days? Please forward your response to P [facility] PREOP.   Thank you   Manson Passey, PA 07/03/2021, 1:45 PM

## 2021-07-07 NOTE — Telephone Encounter (Addendum)
   Name: Michael [name]  DOB: 13-Oct-1945  MRN: [id number]   Primary Cardiologist: Bryan Lemma, MD  Chart reviewed as part of pre-operative protocol coverage. Patient was contacted 07/07/2021 in reference to pre-operative risk assessment for pending surgery as outlined below.  Michael [name] was last seen on 06/17/21 by Edd Fabian, NP. I reached out to patient for update on how he is doing. Got daughter who is on DPR to speak to. They affirm he has been doing well without any new cardiac symptoms since last OV. Therefore, based on ACC/AHA guidelines, the patient would be at acceptable risk for the planned procedure without further cardiovascular testing. They were advised that if he develops new symptoms prior to surgery to contact our office to arrange for a follow-up visit, and they verbalized understanding. Therefore, based on ACC/AHA guidelines, the patient would be at acceptable risk for the planned procedure without further cardiovascular testing.   Per Dr. Herbie Baltimore, OK to hold Plavix for 5 days as requested for procedure.  I will route this recommendation to the requesting party via Epic fax function and remove from pre-op pool. Please call with questions.  Laurann Montana, PA-C 07/07/2021, 9:59 AM

## 2021-07-13 NOTE — Anesthesia Preprocedure Evaluation (Addendum)
Anesthesia Evaluation  Patient identified by MRN, date of birth, ID band Patient awake    Reviewed: Allergy & Precautions, NPO status , Patient's Chart, lab work & pertinent test results, reviewed documented beta blocker date and time   History of Anesthesia Complications Negative for: history of anesthetic complications  Airway Mallampati: II  TM Distance: >3 FB Neck ROM: Full    Dental  (+) Edentulous Upper, Edentulous Lower   Pulmonary COPD,  oxygen dependent, former smoker,    Pulmonary exam normal        Cardiovascular hypertension, Pt. on home beta blockers and Pt. on medications + CAD, + Past MI, + Cardiac Stents (2018), + CABG and + Peripheral Vascular Disease  Normal cardiovascular exam     Neuro/Psych negative neurological ROS  negative psych ROS   GI/Hepatic Neg liver ROS, GERD  Medicated and Controlled,  Endo/Other  Hypothyroidism   Renal/GU Renal InsufficiencyRenal disease  negative genitourinary   Musculoskeletal  (+) Arthritis ,   Abdominal   Peds  Hematology negative hematology ROS (+)   Anesthesia Other Findings Day of surgery medications reviewed with patient.  Reproductive/Obstetrics negative OB ROS                            Anesthesia Physical Anesthesia Plan  ASA: 3  Anesthesia Plan: MAC   Post-op Pain Management: Minimal or no pain anticipated   Induction:   PONV Risk Score and Plan: Treatment may vary due to age or medical condition and Propofol infusion  Airway Management Planned: Natural Airway and Nasal Cannula  Additional Equipment: None  Intra-op Plan:   Post-operative Plan:   Informed Consent: I have reviewed the patients History and Physical, chart, labs and discussed the procedure including the risks, benefits and alternatives for the proposed anesthesia with the patient or authorized representative who has indicated his/her understanding and  acceptance.       Plan Discussed with: CRNA  Anesthesia Plan Comments:        Anesthesia Quick Evaluation

## 2021-07-14 ENCOUNTER — Ambulatory Visit (HOSPITAL_COMMUNITY)
Admission: RE | Admit: 2021-07-14 | Discharge: 2021-07-14 | Disposition: A | Discharge status: Home / Self Care | Payer: Medicare HMO | Attending: Gastroenterology | Admitting: Gastroenterology

## 2021-07-14 ENCOUNTER — Ambulatory Visit (HOSPITAL_COMMUNITY): Payer: Medicare HMO | Admitting: Anesthesiology

## 2021-07-14 ENCOUNTER — Encounter (HOSPITAL_COMMUNITY)
Admission: RE | Disposition: A | Discharge status: Home / Self Care | Payer: Self-pay | Source: Home / Self Care | Attending: Gastroenterology

## 2021-07-14 ENCOUNTER — Encounter (HOSPITAL_COMMUNITY): Payer: Self-pay | Admitting: Gastroenterology

## 2021-07-14 ENCOUNTER — Other Ambulatory Visit: Payer: Self-pay

## 2021-07-14 DIAGNOSIS — B9681 Helicobacter pylori [H. pylori] as the cause of diseases classified elsewhere: Secondary | ICD-10-CM | POA: Diagnosis not present

## 2021-07-14 DIAGNOSIS — K31A19 Gastric intestinal metaplasia without dysplasia, unspecified site: Secondary | ICD-10-CM | POA: Diagnosis not present

## 2021-07-14 DIAGNOSIS — K449 Diaphragmatic hernia without obstruction or gangrene: Secondary | ICD-10-CM | POA: Diagnosis not present

## 2021-07-14 DIAGNOSIS — R1310 Dysphagia, unspecified: Secondary | ICD-10-CM | POA: Insufficient documentation

## 2021-07-14 DIAGNOSIS — K200 Eosinophilic esophagitis: Secondary | ICD-10-CM | POA: Diagnosis not present

## 2021-07-14 DIAGNOSIS — Z6831 Body mass index (BMI) 31.0-31.9, adult: Secondary | ICD-10-CM | POA: Diagnosis not present

## 2021-07-14 DIAGNOSIS — R1013 Epigastric pain: Secondary | ICD-10-CM | POA: Diagnosis not present

## 2021-07-14 DIAGNOSIS — Z79899 Other long term (current) drug therapy: Secondary | ICD-10-CM | POA: Insufficient documentation

## 2021-07-14 DIAGNOSIS — R634 Abnormal weight loss: Secondary | ICD-10-CM | POA: Diagnosis not present

## 2021-07-14 DIAGNOSIS — Z9981 Dependence on supplemental oxygen: Secondary | ICD-10-CM | POA: Insufficient documentation

## 2021-07-14 DIAGNOSIS — K222 Esophageal obstruction: Secondary | ICD-10-CM | POA: Diagnosis not present

## 2021-07-14 DIAGNOSIS — K2289 Other specified disease of esophagus: Secondary | ICD-10-CM | POA: Diagnosis not present

## 2021-07-14 DIAGNOSIS — J449 Chronic obstructive pulmonary disease, unspecified: Secondary | ICD-10-CM | POA: Insufficient documentation

## 2021-07-14 DIAGNOSIS — K2950 Unspecified chronic gastritis without bleeding: Secondary | ICD-10-CM | POA: Diagnosis not present

## 2021-07-14 DIAGNOSIS — K920 Hematemesis: Secondary | ICD-10-CM | POA: Insufficient documentation

## 2021-07-14 DIAGNOSIS — K219 Gastro-esophageal reflux disease without esophagitis: Secondary | ICD-10-CM | POA: Insufficient documentation

## 2021-07-14 DIAGNOSIS — R1319 Other dysphagia: Secondary | ICD-10-CM

## 2021-07-14 DIAGNOSIS — K224 Dyskinesia of esophagus: Secondary | ICD-10-CM | POA: Insufficient documentation

## 2021-07-14 DIAGNOSIS — R7989 Other specified abnormal findings of blood chemistry: Secondary | ICD-10-CM

## 2021-07-14 DIAGNOSIS — K3189 Other diseases of stomach and duodenum: Secondary | ICD-10-CM | POA: Diagnosis not present

## 2021-07-14 DIAGNOSIS — Z87891 Personal history of nicotine dependence: Secondary | ICD-10-CM | POA: Insufficient documentation

## 2021-07-14 DIAGNOSIS — I10 Essential (primary) hypertension: Secondary | ICD-10-CM | POA: Insufficient documentation

## 2021-07-14 HISTORY — PX: ESOPHAGOGASTRODUODENOSCOPY (EGD) WITH PROPOFOL: SHX5813

## 2021-07-14 HISTORY — PX: SAVORY DILATION: SHX5439

## 2021-07-14 HISTORY — PX: BIOPSY: SHX5522

## 2021-07-14 SURGERY — ESOPHAGOGASTRODUODENOSCOPY (EGD) WITH PROPOFOL
Anesthesia: Monitor Anesthesia Care

## 2021-07-14 MED ORDER — PROPOFOL 500 MG/50ML IV EMUL
INTRAVENOUS | Status: DC | PRN
  Administered 2021-07-14: 125 ug/kg/min via INTRAVENOUS

## 2021-07-14 MED ORDER — CLOPIDOGREL BISULFATE 75 MG PO TABS: 75.0000 mg | ORAL_TABLET | Freq: Every day | ORAL | 3 refills | Status: DC

## 2021-07-14 MED ORDER — SODIUM CHLORIDE 0.9 % IV SOLN: INTRAVENOUS | Status: DC

## 2021-07-14 MED ORDER — LIDOCAINE 2% (20 MG/ML) 5 ML SYRINGE
INTRAMUSCULAR | Status: DC | PRN
  Administered 2021-07-14: 100 mg via INTRAVENOUS

## 2021-07-14 MED ORDER — EPHEDRINE SULFATE-NACL 50-0.9 MG/10ML-% IV SOSY
PREFILLED_SYRINGE | INTRAVENOUS | Status: DC | PRN
  Administered 2021-07-14 (×2): 5 mg via INTRAVENOUS

## 2021-07-14 MED ORDER — LACTATED RINGERS IV SOLN
INTRAVENOUS | Status: DC
  Administered 2021-07-14: 1000 mL via INTRAVENOUS

## 2021-07-14 MED ORDER — PROPOFOL 10 MG/ML IV BOLUS
INTRAVENOUS | Status: DC | PRN
  Administered 2021-07-14: 20 mg via INTRAVENOUS

## 2021-07-14 MED ORDER — LACTATED RINGERS IV SOLN: INTRAVENOUS | Status: DC

## 2021-07-14 SURGICAL SUPPLY — 14 items

## 2021-07-14 NOTE — Discharge Instructions (Signed)
YOU HAD AN ENDOSCOPIC PROCEDURE TODAY: Refer to the procedure report and other information in the discharge instructions given to you for any specific questions about what was found during the examination. If this information does not answer your questions, please call [place] office at [phone] to clarify.   YOU SHOULD EXPECT: Some feelings of bloating in the abdomen. Passage of more gas than usual. Walking can help get rid of the air that was put into your GI tract during the procedure and reduce the bloating. If you had a lower endoscopy (such as a colonoscopy or flexible sigmoidoscopy) you may notice spotting of blood in your stool or on the toilet paper. Some abdominal soreness may be present for a day or two, also.  DIET: Your first meal following the procedure should be a light meal and then it is ok to progress to your normal diet. A half-sandwich or bowl of soup is an example of a good first meal. Heavy or fried foods are harder to digest and may make you feel nauseous or bloated. Drink plenty of fluids but you should avoid alcoholic beverages for 24 hours. If you had a esophageal dilation, please see attached instructions for diet.    ACTIVITY: Your care partner should take you home directly after the procedure. You should plan to take it easy, moving slowly for the rest of the day. You can resume normal activity the day after the procedure however YOU SHOULD NOT DRIVE, use power tools, machinery or perform tasks that involve climbing or major physical exertion for 24 hours (because of the sedation medicines used during the test).   SYMPTOMS TO REPORT IMMEDIATELY: A gastroenterologist can be reached at any hour. Please call [phone]  for any of the following symptoms:   Following upper endoscopy (EGD, EUS, ERCP, esophageal dilation) Vomiting of blood or coffee ground material  New, significant abdominal pain  New, significant chest pain or pain under the shoulder blades  Painful or  persistently difficult swallowing  New shortness of breath  Black, tarry-looking or red, bloody stools  FOLLOW UP:  If any biopsies were taken you will be contacted by phone or by letter within the next 1-3 weeks. Call [phone]  if you have not heard about the biopsies in 3 weeks.  Please also call with any specific questions about appointments or follow up tests.  

## 2021-07-14 NOTE — Transfer of Care (Signed)
Immediate Anesthesia Transfer of Care Note  Patient: Kalieb Freeland  Procedure(s) Performed: ESOPHAGOGASTRODUODENOSCOPY (EGD) WITH PROPOFOL BIOPSY SAVORY DILATION  Patient Location: Endoscopy Unit  Anesthesia Type:MAC  Level of Consciousness: drowsy  Airway & Oxygen Therapy: Patient Spontanous Breathing and Patient connected to face mask oxygen  Post-op Assessment: Report given to RN and Post -op Vital signs reviewed and stable  Post vital signs: Reviewed and stable  Last Vitals:  Vitals Value Taken Time  BP    Temp    Pulse 59 07/14/21 0908  Resp 21 07/14/21 0908  SpO2 100 % 07/14/21 0908  Vitals shown include unvalidated device data.  Last Pain:  Vitals:   07/14/21 0806  TempSrc: Oral  PainSc: 0-No pain         Complications: No notable events documented.

## 2021-07-14 NOTE — H&P (Signed)
GASTROENTEROLOGY PROCEDURE H&P NOTE   Primary Care Physician: Lorrene Reid, PA-C  HPI: Michael [name] is a 75 y.o. male who presents for dysphagia, pain, unintentional weight loss, history hematemesis, hiatal hernia, barium swallow with esophageal dysmotility but no overt stricture, recent H. pylori infection noted on stool antigen testing.  Past Medical History:  Diagnosis Date   Arthritis    "right hip; right wrist" (11/24/2016)   CAD, multiple vessel 05/2011   90% mid LAD with diffuse proximal stenosis, 100% proximal RCA --> s/p CABG x 2 (LIMA-LAD, SVG-dRCA); b) Lexiscan Myoview 09/2016 w/ Inferior-Inferolateral defect -> Cath:  11/24/16: 100% CTO of SVG-RCA (but nagtive /RCA only subtotal occlusion) --> PCI with DES x2 (resolute onyx DES 2.25 mm x 30 mm, 2.5 mm x 20 mm) RCA   Carotid artery occlusion    COPD (chronic obstructive pulmonary disease) with emphysema (HCC)    Essential hypertension    History of gout    History of tobacco abuse    quit 05/2011   Hyperlipidemia with target LDL less than 70    Hypothyroidism    MI (myocardial infarction) ([place]) 05/2011   "I had 3 in 2 days"   NSTEMI (non-ST elevated myocardial infarction) ([place]) 06/05/2011   With CHF - probably late presentation for missed STEMI --> CATH- MV CAD;; Echo 07/2001: EF > 55%, mild MAC, mild TR, no WMA  with Post-op Setpal dyskinesis, Apex not seen)   PAD (peripheral artery disease) ([place]) 06/08/2011   a) R Common Iliac 100%, L Common Iliac 80+% occlusion; b) L CIA - Aterectomy- 10 mm x 40 mm self-extending Stent (postdilated to 7 mm with 20-30% residual)--> Deferred R AoFem bypass;; c) LEA Dopplers 04/2012 & 04/2014 - RABI 0.8 & LABI 1.1 - STABLE, no change   Pneumonia 1940s X 1   Presence of drug coated stent in right coronary artery 11/24/2016   p-mRCA: Resolute Onyx DES 2.25 mm x 38 mm --> 2.5 mm x 18 mm (post-dilated from 2.7-2.8 mm)   Past Surgical History:  Procedure Laterality Date   CORONARY ANGIOPLASTY  WITH STENT PLACEMENT  11/24/2016   with 100% SVG-RCA & flow in native RCA now present --> PCI-p-m RCA 2 Overlapping Resolute DES (2.25 x 38, 1.5 x 18)   CORONARY ARTERY BYPASS GRAFT  06/10/2011   oLIMA to LAD, SVG to PDA (Dr. Servando Snare)   CORONARY STENT INTERVENTION N/A 11/24/2016   Procedure: Coronary Stent Intervention;  Surgeon: Leonie Man, MD;  Location: [facility]: DES RESOLUTE ONYX 2.25X38 & overlapping DES RESOLUTE ONYX 2.5X18    ILIAC ARTERY STENT  10/27/2011   (Dr. Ellyn Hack Dr. Gwenlyn Found) left CIA - 42mx40mm self-expanding stent, decreasing downt to 20-30% lesion, 100% occluded R CIA with extensive lumbar collateralization to R internal iliac;  05/2016: Ao-Ileac & LEA Dopplers: RABI 0.8, LABI 1.2. B TBI 0.77 -- he has known B disease s/p L CIA stent. RLABI decreased and LABI is normal; aortoiliac atherosclerosis w/ CTO of R CIA but normal REIA.   LEFT HEART CATH AND CORONARY ANGIOGRAPHY  06/09/2011   90% mid LAD with diffuse proximal stenosis, 100% proximal RCA --> s/p CABG x 2 (LIMA-LAD, SVG-dRCA);   LEFT HEART CATH AND CORS/GRAFTS ANGIOGRAPHY N/A 11/24/2016   Procedure: LEFT HEART CATH AND CORS/GRAFTS ANGIOGRAPHY;  Surgeon: DLeonie Man MD;  [facility]: 100% SVG-RCA, but now native RCA only sub-CTO with antegrade flow.  85% mLAD after D1. 40% oOM1 --> PCI of Native RCA  NM MYOVIEW LTD  09/2016    EF 62%. Medium sized, moderate severity defect in the basal inferior, mid inferoseptal and mid inferior walls is partially reversible. - INTERMEDIATE RISK -- CATH   PERCUTANEOUS STENT INTERVENTION Left 10/27/2011   Procedure: PERCUTANEOUS STENT INTERVENTION;  Surgeon: Lorretta Harp, MD / Leonie Man, MD;  Location: [facility] CATH LAB;  Service: Cardiovascular;  Laterality: Left;   TONSILLECTOMY  ~ 1963   TRANSTHORACIC ECHOCARDIOGRAM  08/19/2011   EF=>55%; mild mitral annular calcif; mild TR   No current facility-administered medications for this encounter.   No  current facility-administered medications for this encounter. No Known Allergies Family History  Problem Relation Age of Onset   Heart disease Mother        valve replacement   Diabetes Mother    Heart failure Mother    Pneumonia Father    CAD Sister        cardiac stent   Stroke Sister        stents in neck   Colon cancer Neg Hx    Esophageal cancer Neg Hx    Inflammatory bowel disease Neg Hx    Liver disease Neg Hx    Pancreatic cancer Neg Hx    Rectal cancer Neg Hx    Stomach cancer Neg Hx    Social History   Socioeconomic History   Marital status: Divorced    Spouse name: Not on file   Number of children: 3   Years of education: Not on file   Highest education level: Not on file  Occupational History   Not on file  Tobacco Use   Smoking status: Former    Packs/day: 2.00    Years: 51.50    Pack years: 103.00    Types: Cigarettes    Quit date: 06/08/2011    Years since quitting: 10.1   Smokeless tobacco: Former    Types: Chew    Quit date: 07/13/2003  Vaping Use   Vaping Use: Never used  Substance and Sexual Activity   Alcohol use: No    Comment: 11/24/2016 "nothing in 2 1/2 years; never an alcoholic"   Drug use: No   Sexual activity: Never  Other Topics Concern   Not on file  Social History Narrative   Not on file   Social Determinants of Health   Financial Resource Strain: Not on file  Food Insecurity: Not on file  Transportation Needs: Not on file  Physical Activity: Not on file  Stress: Not on file  Social Connections: Not on file  Intimate Partner Violence: Not on file    Physical Exam: There were no vitals filed for this visit. There is no height or weight on file to calculate BMI. GEN: NAD EYE: Sclerae anicteric ENT: MMM CV: Non-tachycardic GI: Soft, NT/ND NEURO:  Alert & Oriented x 3  Lab Results: No results for input(s): WBC, HGB, HCT, PLT in the last 72 hours. BMET No results for input(s): NA, K, CL, CO2, GLUCOSE, BUN,  CREATININE, CALCIUM in the last 72 hours. LFT No results for input(s): PROT, ALBUMIN, AST, ALT, ALKPHOS, BILITOT, BILIDIR, IBILI in the last 72 hours. PT/INR No results for input(s): LABPROT, INR in the last 72 hours.   Impression / Plan: This is a 75 y.o.male who presents for dysphagia, pain, unintentional weight loss, history hematemesis, hiatal hernia, barium swallow with esophageal dysmotility but no overt stricture, recent H. pylori infection noted on stool antigen testing.  The risks and benefits of endoscopic  evaluation/treatment were discussed with the patient and/or family; these include but are not limited to the risk of perforation, infection, bleeding, missed lesions, lack of diagnosis, severe illness requiring hospitalization, as well as anesthesia and sedation related illnesses.  The patient's history has been reviewed, patient examined, no change in status, and deemed stable for procedure.  The patient and/or family is agreeable to proceed.    Justice Britain, MD [facility] Office # [phone]

## 2021-07-14 NOTE — Op Note (Signed)
[facility] Patient Name: Michael [name] Procedure Date: 07/14/2021 MRN: [id number] Attending MD: Justice Britain , MD Date of Birth: 1946/07/30 CSN: [id number] Age: 75 Admit Type: Outpatient Procedure:                Upper GI endoscopy Indications:              Epigastric abdominal pain, Dysphagia, Abnormal UGI                            series, Follow-up of Helicobacter pylori, Weight                            loss Providers:                Justice Britain, MD, Jeanella Cara, RN,                            Lodema Hong Technician, Technician, Cletis Athens,                            Technician Referring MD:             Lorrene Reid Medicines:                Monitored Anesthesia Care Complications:            No immediate complications. Estimated Blood Loss:     Estimated blood loss was minimal. Procedure:                Pre-Anesthesia Assessment:                           - Prior to the procedure, a History and Physical                            was performed, and patient medications and                            allergies were reviewed. The patient's tolerance of                            previous anesthesia was also reviewed. The risks                            and benefits of the procedure and the sedation                            options and risks were discussed with the patient.                            All questions were answered, and informed consent                            was obtained. Prior Anticoagulants: The patient has                            taken Plavix (clopidogrel), last dose was  5 days                            prior to procedure. ASA Grade Assessment: III - A                            patient with severe systemic disease. After                            reviewing the risks and benefits, the patient was                            deemed in satisfactory condition to undergo the                            procedure.                            After obtaining informed consent, the endoscope was                            passed under direct vision. Throughout the                            procedure, the patient's blood pressure, pulse, and                            oxygen saturations were monitored continuously. The                            [id number]-H190 ([id number]) Olympus endoscope was introduced                            through the mouth, and advanced to the second part                            of duodenum. The upper GI endoscopy was                            accomplished without difficulty. The patient                            tolerated the procedure. Scope In: Scope Out: Findings:      No gross lesions were noted in the entire esophagus. Biopsies were taken       with a cold forceps for histology to rule out EoE/LoE.      A non-obstructing Schatzki ring was found at the gastroesophageal       junction. Biopsies were taken with a cold forceps for histology and to       disrupt it.      The Z-line was irregular and was found 38 cm from the incisors.      After the rest of the EGD was complete, a guidewire was placed and the       scope was withdrawn. Dilation was performed in the esophagus with a  Savary dilator with no resistance at 18 mm. The dilation site was       examined following endoscope reinsertion and showed no change.      A 2 cm hiatal hernia was present.      Patchy mildly erythematous mucosa without bleeding was found in the       entire examined stomach. Biopsies were taken with a cold forceps for       histology and Helicobacter pylori testing.      No gross lesions were noted in the duodenal bulb, in the first portion       of the duodenum and in the second portion of the duodenum. Biopsies were       taken with a cold forceps for histology. Impression:               - No gross lesions in esophagus. Biopsied.                           - Non-obstructing Schatzki ring.  Disrupted.                           - Z-line irregular, 38 cm from the incisors.                           - Dilation performed in the esophagus with Savary                            18 mm.                           - 2 cm hiatal hernia.                           - Erythematous mucosa in the stomach. Biopsied.                           - No gross lesions in the duodenal bulb, in the                            first portion of the duodenum and in the second                            portion of the duodenum. Biopsied. Moderate Sedation:      Not Applicable - Patient had care per Anesthesia. Recommendation:           - The patient will be observed post-procedure,                            until all discharge criteria are met.                           - Discharge patient to home.                           - Patient has a contact number available for  emergencies. The signs and symptoms of potential                            delayed complications were discussed with the                            patient. Return to normal activities tomorrow.                            Written discharge instructions were provided to the                            patient.                           - Dilation diet as per protocol. 2 hours of liquids                            and then may advance to soft diet today.                           - Please use Cepacol or Halls Lozenges +/-                            Chloraseptic spray for next 72-96 hours to aid in                            sore thoat should you experience this.                           - May restart Plavix in 48 hours (11/23 AM - if he                            takes in AM or 11/23 PM - if he takes in PM).                           - Continue present medications.                           - Await pathology results.                           - Pending H. pylori biopsies will decide if any                             further treatment may be required or if Stool                            Antigen testing when off PPI can be considered down                            the road.                           -  If dysphagia symptoms persist, recommend                            consideration of Manometry. Could CRE dilate the                            Schatzki ring in future, but not clear this is the                            main factor for patient at this time.                           - If unintentional weight loss returns, may need to                            consider Colonoscopy or if bowel habits change.                           - The findings and recommendations were discussed                            with the patient.                           - The findings and recommendations were discussed                            with the patient's family. Procedure Code(s):        --- Professional ---                           [phone], Esophagogastroduodenoscopy, flexible,                            transoral; with insertion of guide wire followed by                            passage of dilator(s) through esophagus over guide                            wire Diagnosis Code(s):        --- Professional ---                           K22.2, Esophageal obstruction                           K22.8, Other specified diseases of esophagus                           K44.9, Diaphragmatic hernia without obstruction or                            gangrene  K31.89, Other diseases of stomach and duodenum                           R10.13, Epigastric pain                           R13.10, Dysphagia, unspecified                           S92.33, Helicobacter pylori [H. pylori] as the                            cause of diseases classified elsewhere                           R63.4, Abnormal weight loss                           R93.3, Abnormal findings on diagnostic imaging of                             other parts of digestive tract CPT copyright 2019 [employer]. All rights reserved. The codes documented in this report are preliminary and upon coder review may  be revised to meet current compliance requirements. Justice Britain, MD 07/14/2021 9:16:04 AM Number of Addenda: 0

## 2021-07-14 NOTE — Anesthesia Postprocedure Evaluation (Signed)
Anesthesia Post Note  Patient: Michael [name]  Procedure(s) Performed: ESOPHAGOGASTRODUODENOSCOPY (EGD) WITH PROPOFOL BIOPSY SAVORY DILATION     Patient location during evaluation: PACU Anesthesia Type: MAC Level of consciousness: awake and alert and oriented Pain management: pain level controlled Vital Signs Assessment: post-procedure vital signs reviewed and stable Respiratory status: spontaneous breathing, nonlabored ventilation and respiratory function stable Cardiovascular status: blood pressure returned to baseline Postop Assessment: no apparent nausea or vomiting Anesthetic complications: no   No notable events documented.  Last Vitals:  Vitals:   07/14/21 0806 07/14/21 0906  BP: (!) 151/66 (!) 111/59  Pulse: (!) 58 60  Resp: (!) 21 (!) 22  Temp: 36.6 C 36.9 C  SpO2: 100% 100%    Last Pain:  Vitals:   07/14/21 0906  TempSrc: Axillary  PainSc: Asleep                 Marthenia Rolling

## 2021-07-15 ENCOUNTER — Encounter: Payer: Self-pay | Admitting: Gastroenterology

## 2021-07-15 LAB — SURGICAL PATHOLOGY

## 2021-07-16 ENCOUNTER — Encounter (HOSPITAL_COMMUNITY): Payer: Self-pay | Admitting: Gastroenterology

## 2021-07-21 ENCOUNTER — Other Ambulatory Visit: Payer: Self-pay | Admitting: Physician Assistant

## 2021-07-21 DIAGNOSIS — E559 Vitamin D deficiency, unspecified: Secondary | ICD-10-CM

## 2021-07-22 ENCOUNTER — Telehealth: Payer: Self-pay | Admitting: Gastroenterology

## 2021-07-22 NOTE — Telephone Encounter (Signed)
Pt's daughter returned call. Explained that patient had previously been on Plavix. He was only holding Plavix prior to his procedure with Dr. Meridee Score as per protocol. Pt was to resume Plavix 48 hours after procedure. Plavix is managed by patients's cardiologist, but daughter states she was confused based off the d/c instructions at the hospital. I answered all questions and daughter verbalized understanding of the need for patient to resume Plavix as directed.

## 2021-07-22 NOTE — Telephone Encounter (Signed)
Unable to reach patient. Had to leave message.

## 2021-07-24 ENCOUNTER — Telehealth: Payer: Self-pay | Admitting: Cardiology

## 2021-07-24 ENCOUNTER — Other Ambulatory Visit: Payer: Self-pay

## 2021-07-24 MED ORDER — METOPROLOL TARTRATE 25 MG PO TABS: 25.0000 mg | ORAL_TABLET | Freq: Two times a day (BID) | ORAL | 3 refills | Status: DC

## 2021-07-24 NOTE — Telephone Encounter (Signed)
*  STAT* If patient is at the pharmacy, call can be transferred to refill team.   1. Which medications need to be refilled? (please list name of each medication and dose if known) metoprolol tartrate (LOPRESSOR) 25 MG tablet  2. Which pharmacy/location (including street and city if local pharmacy) is medication to be sent to? [facility] [place] - [address] [place] [address]  3. Do they need a 30 day or 90 day supply? 90 day   Patient is completely out of medication.

## 2021-07-25 ENCOUNTER — Other Ambulatory Visit: Payer: Self-pay

## 2021-07-25 MED ORDER — METOPROLOL TARTRATE 25 MG PO TABS: 25.0000 mg | ORAL_TABLET | Freq: Two times a day (BID) | ORAL | 3 refills | Status: AC

## 2021-07-25 NOTE — Telephone Encounter (Signed)
Rx was sent 07/24/21 another way. Called patient and informed him his Rx is at his pharmacy and he should be able to pick it up when needed.

## 2021-08-20 ENCOUNTER — Other Ambulatory Visit: Payer: Self-pay

## 2021-08-20 MED ORDER — FENOFIBRATE 48 MG PO TABS: 48.0000 mg | ORAL_TABLET | Freq: Every day | ORAL | 3 refills | Status: AC

## 2021-08-27 ENCOUNTER — Ambulatory Visit: Payer: Medicare HMO | Admitting: Gastroenterology

## 2021-09-08 ENCOUNTER — Other Ambulatory Visit: Payer: Self-pay | Admitting: Physician Assistant

## 2021-09-08 DIAGNOSIS — E039 Hypothyroidism, unspecified: Secondary | ICD-10-CM

## 2021-09-24 ENCOUNTER — Other Ambulatory Visit: Payer: Self-pay

## 2021-09-24 MED ORDER — ATORVASTATIN CALCIUM 20 MG PO TABS: 20.0000 mg | ORAL_TABLET | Freq: Every day | ORAL | 3 refills | Status: AC

## 2021-10-08 ENCOUNTER — Ambulatory Visit: Payer: Medicare HMO | Admitting: Gastroenterology

## 2021-10-08 ENCOUNTER — Encounter: Payer: Self-pay | Admitting: Gastroenterology

## 2021-10-08 ENCOUNTER — Other Ambulatory Visit (INDEPENDENT_AMBULATORY_CARE_PROVIDER_SITE_OTHER): Payer: Medicare HMO

## 2021-10-08 ENCOUNTER — Other Ambulatory Visit: Payer: Self-pay

## 2021-10-08 VITALS — BP 100/70 | HR 88

## 2021-10-08 DIAGNOSIS — Z8619 Personal history of other infectious and parasitic diseases: Secondary | ICD-10-CM

## 2021-10-08 DIAGNOSIS — G8929 Other chronic pain: Secondary | ICD-10-CM

## 2021-10-08 DIAGNOSIS — R7989 Other specified abnormal findings of blood chemistry: Secondary | ICD-10-CM

## 2021-10-08 DIAGNOSIS — R1319 Other dysphagia: Secondary | ICD-10-CM | POA: Insufficient documentation

## 2021-10-08 DIAGNOSIS — K31A0 Gastric intestinal metaplasia, unspecified: Secondary | ICD-10-CM

## 2021-10-08 DIAGNOSIS — R17 Unspecified jaundice: Secondary | ICD-10-CM | POA: Diagnosis not present

## 2021-10-08 DIAGNOSIS — K5909 Other constipation: Secondary | ICD-10-CM

## 2021-10-08 DIAGNOSIS — R82998 Other abnormal findings in urine: Secondary | ICD-10-CM

## 2021-10-08 DIAGNOSIS — K828 Other specified diseases of gallbladder: Secondary | ICD-10-CM

## 2021-10-08 DIAGNOSIS — R1013 Epigastric pain: Secondary | ICD-10-CM

## 2021-10-08 DIAGNOSIS — K2950 Unspecified chronic gastritis without bleeding: Secondary | ICD-10-CM | POA: Diagnosis not present

## 2021-10-08 LAB — CBC
HCT: 36.4 % — ABNORMAL LOW (ref 39.0–52.0)
Hemoglobin: 12.3 g/dL — ABNORMAL LOW (ref 13.0–17.0)
MCHC: 33.8 g/dL (ref 30.0–36.0)
MCV: 96 fl (ref 78.0–100.0)
Platelets: 350 10*3/uL (ref 150.0–400.0)
RBC: 3.79 Mil/uL — ABNORMAL LOW (ref 4.22–5.81)
RDW: 15.3 % (ref 11.5–15.5)
WBC: 6.9 10*3/uL (ref 4.0–10.5)

## 2021-10-08 LAB — COMPREHENSIVE METABOLIC PANEL
ALT: 197 U/L — ABNORMAL HIGH (ref 0–53)
AST: 180 U/L — ABNORMAL HIGH (ref 0–37)
Albumin: 3.6 g/dL (ref 3.5–5.2)
Alkaline Phosphatase: 426 U/L — ABNORMAL HIGH (ref 39–117)
BUN: 13 mg/dL (ref 6–23)
CO2: 29 mEq/L (ref 19–32)
Calcium: 9.3 mg/dL (ref 8.4–10.5)
Chloride: 100 mEq/L (ref 96–112)
Creatinine, Ser: 1.46 mg/dL (ref 0.40–1.50)
GFR: 46.12 mL/min — ABNORMAL LOW (ref >60.00)
Glucose, Bld: 103 mg/dL — ABNORMAL HIGH (ref 70–99)
Potassium: 4.2 mEq/L (ref 3.5–5.1)
Sodium: 136 mEq/L (ref 135–145)
Total Bilirubin: 7.1 mg/dL — ABNORMAL HIGH (ref 0.2–1.2)
Total Protein: 7.3 g/dL (ref 6.0–8.3)

## 2021-10-08 LAB — BILIRUBIN, DIRECT: Bilirubin, Direct: 4.3 mg/dL — ABNORMAL HIGH (ref 0.0–0.3)

## 2021-10-08 LAB — TSH: TSH: 8.64 u[IU]/mL — ABNORMAL HIGH (ref 0.35–5.50)

## 2021-10-08 LAB — GAMMA GT: GGT: 330 U/L — ABNORMAL HIGH (ref 7–51)

## 2021-10-08 LAB — LIPASE: Lipase: 37 U/L (ref 11.0–59.0)

## 2021-10-08 MED ORDER — ESOMEPRAZOLE MAGNESIUM 40 MG PO CPDR: 40.0000 mg | DELAYED_RELEASE_CAPSULE | Freq: Every day | ORAL | 2 refills | Status: AC

## 2021-10-08 NOTE — Progress Notes (Signed)
[facility] VISIT   Primary Care Provider Lorrene Reid, PA-C [place] [address] [phone]   Patient Profile: Michael [name] is a 76 y.o. 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 pylori infection (status post eradication on biopsies), gastric intestinal metaplasia.  The patient presents to the [facility] for an evaluation and management of problem(s) noted below:  Problem List 1. History of Helicobacter infection   2. Gastric intestinal metaplasia   3. Other chronic gastritis without hemorrhage   4. Abdominal pain, chronic, epigastric   5. Jaundice   6. Dark urine   7. Abnormal LFTs   8. Gallbladder sludge   9. Chronic constipation   10. Other dysphagia     History of Present Illness Please see prior note for full details of HPI.  Interval History Today, the patient returns for follow-up and he is accompanied by his daughter.  The patient states that he had been doing relatively well after completion of his H. pylori treatment and at the time of his endoscopy while taking his acid reducing medications.  With that being said, as he has been off the medication for the last few weeks, since he did not have refills, he has had progressive worsening of his pain and discomfort.  He is feeling sick to his stomach and unable to eat as much as he would like.  He feels that he has been maintaining his weight (he did not want to perform a weight check however today).  He is having some reflux symptoms.  He denies any overt dysphagia.  He feels that he has had episodes of dark urine that have come and gone.  He has noticed that his urine has become darker in quality however over the last week.  Discomfort that he is experiencing mostly  is in the midepigastrium in the left upper quadrant.  His daughter states that while on the medication he was much better but still had some episodes of discomfort but not to the extent of what he is experiencing currently.  He wonders why he is having such significant issues after the treatment of his H. pylori and hopeful that it is not something more.  He does deal with issues of chronic constipation but that has been a longer standing issue and he is not interested in any colonoscopy especially with his previously negative Cologuard testing.  GI Review of Systems Positive as above Negative for odynophagia, melena, hematochezia, pruritus   Review of Systems General: Denies fevers/chills/unintentional weight loss Cardiovascular: Denies chest pain Pulmonary: Stable shortness of breath Gastroenterological: See HPI Genitourinary: Denies hematuria Hematological: Positive for history of easy bruising/bleeding due to Plavix Dermatological: He is concerned about possible jaundice Psychological: Mood is anxious and scared that things are worsening with his pain   Medications Current Outpatient Medications  Medication Sig Dispense Refill   atorvastatin (LIPITOR) 20 MG tablet Take 1 tablet (20 mg total) by mouth at bedtime. 90 tablet 3   clopidogrel (PLAVIX) 75 MG tablet Take 1 tablet (75 mg total) by mouth daily. 90 tablet 3   diphenhydrAMINE (BENADRYL) 25 MG tablet Take 25 mg by mouth daily at 12 noon. Allergy medication from [facility]     fenofibrate (TRICOR) 48 MG tablet Take 1 tablet (48 mg total) by mouth daily. 90 tablet 3  levothyroxine (SYNTHROID) 75 MCG tablet TAKE 1 TABLET BY MOUTH DAILY 90 tablet 1   metoprolol tartrate (LOPRESSOR) 25 MG tablet Take 1 tablet (25 mg total) by mouth 2 (two) times daily. 180 tablet 3   nitroGLYCERIN (NITROSTAT) 0.4 MG SL tablet Place 1 tablet (0.4 mg total) under the tongue every 5 (five) minutes as needed. After 3 doses, if no relief dial 911 30 tablet 0    OXYGEN Inhale 5 L into the lungs continuous.     Vitamin D, Ergocalciferol, (DRISDOL) 1.25 MG (50000 UNIT) CAPS capsule TAKE 1 CAPSULE BY MOUTH EVERY 7 DAYS 12 capsule 3   esomeprazole (NEXIUM) 40 MG capsule Take 1 capsule (40 mg total) by mouth daily. 30 capsule 2   No current facility-administered medications for this visit.    Allergies No Known Allergies   Histories Past Medical History:  Diagnosis Date   Arthritis    "right hip; right wrist" (11/24/2016)   CAD, multiple vessel 05/2011   90% mid LAD with diffuse proximal stenosis, 100% proximal RCA --> s/p CABG x 2 (LIMA-LAD, SVG-dRCA); b) Lexiscan Myoview 09/2016 w/ Inferior-Inferolateral defect -> Cath:  11/24/16: 100% CTO of SVG-RCA (but nagtive /RCA only subtotal occlusion) --> PCI with DES x2 (resolute onyx DES 2.25 mm x 30 mm, 2.5 mm x 20 mm) RCA   Carotid artery occlusion    COPD (chronic obstructive pulmonary disease) with emphysema (HCC)    Essential hypertension    History of gout    History of tobacco abuse    quit 05/2011   Hyperlipidemia with target LDL less than 70    Hypothyroidism    MI (myocardial infarction) ([place]) 05/2011   "I had 3 in 2 days"   NSTEMI (non-ST elevated myocardial infarction) ([place]) 06/05/2011   With CHF - probably late presentation for missed STEMI --> CATH- MV CAD;; Echo 07/2001: EF > 55%, mild MAC, mild TR, no WMA  with Post-op Setpal dyskinesis, Apex not seen)   PAD (peripheral artery disease) ([place]) 06/08/2011   a) R Common Iliac 100%, L Common Iliac 80+% occlusion; b) L CIA - Aterectomy- 10 mm x 40 mm self-extending Stent (postdilated to 7 mm with 20-30% residual)--> Deferred R AoFem bypass;; c) LEA Dopplers 04/2012 & 04/2014 - RABI 0.8 & LABI 1.1 - STABLE, no change   Pneumonia 1940s X 1   Presence of drug coated stent in right coronary artery 11/24/2016   p-mRCA: Resolute Onyx DES 2.25 mm x 38 mm --> 2.5 mm x 18 mm (post-dilated from 2.7-2.8 mm)   Past Surgical History:  Procedure Laterality  Date   BIOPSY  07/14/2021   Procedure: BIOPSY;  Surgeon: Irving Copas., MD;  Location: [facility];  Service: Gastroenterology;;   CORONARY ANGIOPLASTY WITH STENT PLACEMENT  11/24/2016   with 100% SVG-RCA & flow in native RCA now present --> PCI-p-m RCA 2 Overlapping Resolute DES (2.25 x 38, 1.5 x 18)   CORONARY ARTERY BYPASS GRAFT  06/10/2011   oLIMA to LAD, SVG to PDA (Dr. Servando Snare)   CORONARY STENT INTERVENTION N/A 11/24/2016   Procedure: Coronary Stent Intervention;  Surgeon: Leonie Man, MD;  Location: [facility] INVASIVE CV LAB: DES RESOLUTE ONYX 2.25X38 & overlapping DES RESOLUTE ONYX 2.5X18    ESOPHAGOGASTRODUODENOSCOPY (EGD) WITH PROPOFOL N/A 07/14/2021   Procedure: ESOPHAGOGASTRODUODENOSCOPY (EGD) WITH PROPOFOL;  Surgeon: Irving Copas., MD;  Location: [facility];  Service: Gastroenterology;  Laterality: N/A;   ILIAC ARTERY STENT  10/27/2011   (Dr. Ellyn Hack Dr. Gwenlyn Found)  left CIA - 79mx40mm self-expanding stent, decreasing downt to 20-30% lesion, 100% occluded R CIA with extensive lumbar collateralization to R internal iliac;  05/2016: Ao-Ileac & LEA Dopplers: RABI 0.8, LABI 1.2. B TBI 0.77 -- he has known B disease s/p L CIA stent. RLABI decreased and LABI is normal; aortoiliac atherosclerosis w/ CTO of R CIA but normal REIA.   LEFT HEART CATH AND CORONARY ANGIOGRAPHY  06/09/2011   90% mid LAD with diffuse proximal stenosis, 100% proximal RCA --> s/p CABG x 2 (LIMA-LAD, SVG-dRCA);   LEFT HEART CATH AND CORS/GRAFTS ANGIOGRAPHY N/A 11/24/2016   Procedure: LEFT HEART CATH AND CORS/GRAFTS ANGIOGRAPHY;  Surgeon: DLeonie Man MD;  [facility]: 100% SVG-RCA, but now native RCA only sub-CTO with antegrade flow.  85% mLAD after D1. 40% oOM1 --> PCI of Native RCA   NM MYOVIEW LTD  09/2016    EF 62%. Medium sized, moderate severity defect in the basal inferior, mid inferoseptal and mid inferior walls is partially reversible. - INTERMEDIATE RISK -- CATH    PERCUTANEOUS STENT INTERVENTION Left 10/27/2011   Procedure: PERCUTANEOUS STENT INTERVENTION;  Surgeon: JLorretta Harp MD / DLeonie Man MD;  [facility];  Service: Cardiovascular;  Laterality: Left;   SAVORY DILATION N/A 07/14/2021   Procedure: SAVORY DILATION;  Surgeon: MRush LandmarkGTelford Nab, MD;  [facility];  Service: Gastroenterology;  Laterality: N/A;   TONSILLECTOMY  ~ 1963   TRANSTHORACIC ECHOCARDIOGRAM  08/19/2011   EF=>55%; mild mitral annular calcif; mild TR   Social History   Socioeconomic History   Marital status: Divorced    Spouse name: Not on file   Number of children: 3   Years of education: Not on file   Highest education level: Not on file  Occupational History   Not on file  Tobacco Use   Smoking status: Former    Packs/day: 2.00    Years: 51.50    Pack years: 103.00    Types: Cigarettes    Quit date: 06/08/2011    Years since quitting: 10.3   Smokeless tobacco: Former    Types: Chew    Quit date: 07/13/2003  Vaping Use   Vaping Use: Never used  Substance and Sexual Activity   Alcohol use: No    Comment: 11/24/2016 "nothing in 2 1/2 years; never an alcoholic"   Drug use: No   Sexual activity: Not Currently  Other Topics Concern   Not on file  Social History Narrative   Not on file   Social Determinants of Health   Financial Resource Strain: Not on file  Food Insecurity: Not on file  Transportation Needs: Not on file  Physical Activity: Not on file  Stress: Not on file  Social Connections: Not on file  Intimate Partner Violence: Not on file   Family History  Problem Relation Age of Onset   Heart disease Mother        valve replacement   Diabetes Mother    Heart failure Mother    Pneumonia Father    CAD Sister        cardiac stent   Stroke Sister        stents in neck   Colon cancer Neg Hx    Esophageal cancer Neg Hx    Inflammatory bowel disease Neg Hx    Liver disease Neg Hx    Pancreatic cancer Neg Hx     Rectal cancer Neg Hx    Stomach cancer Neg  Hx    I have reviewed his medical, social, and family history in detail and updated the electronic medical record as necessary.    PHYSICAL EXAMINATION  BP 100/70    Pulse 88  Wt Readings from Last 3 Encounters:  07/14/21 199 lb 8.3 oz (90.5 kg)  06/17/21 199 lb 9.6 oz (90.5 kg)  06/06/21 201 lb (91.2 kg)  GEN: NAD, appears much older than stated age, nontoxic, accompanied by daughter PSYCH: Cooperative, without pressured speech EYE: Scleral icterus present ENT: Nasal cannula in place, dry mucous membranes CV: Nontachycardic RESP: Wheezing appreciated GI: NABS, soft, NT/ND, with TTP in the midepigastrium, no rebound or guarding  MSK/EXT: Lower extremity edema bilaterally SKIN: Visible jaundice without spider angiomata NEURO:  Alert & Oriented x 3, no focal deficits   REVIEW OF DATA  I reviewed the following data at the time of this encounter:  GI Procedures and Studies  November 2022 EGD - No gross lesions in esophagus. Biopsied. - Non-obstructing Schatzki ring. Disrupted. - Z-line irregular, 38 cm from the incisors. - Dilation performed in the esophagus with Savary 18 mm. - 2 cm hiatal hernia. - Erythematous mucosa in the stomach. Biopsied. - No gross lesions in the duodenal bulb, in the first portion of the duodenum and in the second portion of the duodenum. Biopsied.  Pathology FINAL MICROSCOPIC DIAGNOSIS:  A. GASTRIC, BIOPSY:  - Mild chronic gastritis with intestinal metaplasia.  - Warthin-Starry negative for Helicobacter pylori.  B. DUODENUM, BIOPSY:  - Duodenal mucosa with intact villous architecture.  - No villous atrophy or increased intraepithelial lymphocytes.  C. ESOPHAGUS, SCHATZKI RING, BIOPSY:  - Unremarkable squamous mucosa.  - No eosinophilic esophagitis.  D. ESOPHAGUS, BIOPSY:  - Unremarkable squamous mucosa.  - No eosinophilic esophagitis.   June 2022 Cologuard negative  Laboratory Studies   Reviewed those in epic  Imaging Studies  No new imaging studies to review   ASSESSMENT  Mr. Soffer is a 76 y.o. 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 pylori infection (status post eradication on biopsies), gastric intestinal metaplasia.  The patient is seen today for evaluation and management of:  1. History of Helicobacter infection   2. Gastric intestinal metaplasia   3. Other chronic gastritis without hemorrhage   4. Abdominal pain, chronic, epigastric   5. Jaundice   6. Dark urine   7. Abnormal LFTs   8. Gallbladder sludge   9. Chronic constipation   10. Other dysphagia    The patient is hemodynamically stable.  When I had last seen him at the time of his endoscopy last year, he had been feeling better while being on PPI therapy.  We did find that he had H. pylori on stool antigen testing.  He was treated for this and when we performed his endoscopy, his biopsies only showed gastric intestinal metaplasia and chronic gastritis but no evidence of H. pylori infection still being present.  Thus it was felt that he had been eradicated of his infection.  We also performed a dilation and Schatzki ring disruption.  His LFTs had almost completely normalized other than an alk phos of 120.  However, today I am concerned about his recurrent pain and discomfort.  We certainly will be happy to reinitiate him on PPI therapy if this is helpful from a gastritis standpoint which I think it will  be.  More concerning however is that he has scleral icterus today and what looks to be visible jaundice as well.  We knew he had significant gallbladder sludge based on 2021 right upper quadrant ultrasound imaging and a distended gallbladder.  He had no evidence of a pancreatic mass or lesion however at that time.  At this point I  am concerned about the progressive nature of his symptoms.  My hope would be that this is just gallbladder and sludge related but with the jaundice being present I am more concerned about the possibility of something more.  Lets see what his LFTs look like and from there decide if any additional work-up or imaging may be required (most likely cross-sectional imaging if LFTs are truly elevated).  All patient questions were answered to the best of my ability, and the patient agrees to the aforementioned plan of action with follow-up as indicated.   PLAN  Laboratories as outlined below H. pylori stool antigen to be obtained Based on LFTs will need to consider cross-sectional imaging with CT versus MRI or abdominal ultrasound imaging (have some concern based on his O2 requirement that he can do an MRI if needed) Restart Nexium 40 mg daily   Orders Placed This Encounter  Procedures   Helicobacter pylori special antigen   CBC   Comp Met (CMET)   Lipase   TSH   Bilirubin, Direct   Gamma GT    New Prescriptions   No medications on file   Modified Medications   Modified Medication Previous Medication   ESOMEPRAZOLE (NEXIUM) 40 MG CAPSULE esomeprazole (NEXIUM) 40 MG capsule      Take 1 capsule (40 mg total) by mouth daily.    Take 1 capsule (40 mg total) by mouth daily.    Planned Follow Up No follow-ups on file.   Total Time in Face-to-Face and in Coordination of Care for patient including independent/personal interpretation/review of prior testing, medical history, examination, medication adjustment, communicating results with the patient directly, and documentation within the EHR is 30 minutes.   Justice Britain, MD [facility] Office # [phone]

## 2021-10-08 NOTE — Patient Instructions (Addendum)
Dr Meridee Score or staff will contact you within 24 hrs with further recommendations for imaging and follow-up labs, depending upon labs results from today.   Your provider has requested that you go to the basement level for lab work before leaving today. Press "B" on the elevator. The lab is located at the first door on the left as you exit the elevator.  We have sent the following medications to your pharmacy for you to pick up at your convenience: Nexium   If you are age 34 or older, your body mass index should be between 23-30. Your There is no height or weight on file to calculate BMI. If this is out of the aforementioned range listed, please consider follow up with your Primary Care Provider.  The [facility] providers would like to encourage you to use [facility] to communicate with providers for non-urgent requests or questions.  Due to long hold times on the telephone, sending your provider a message by [facility] may be a faster and more efficient way to get a response.  Please allow 48 business hours for a response.  Please remember that this is for non-urgent requests.  _______________________________________________________   Thank you for choosing me and [facility].  Dr. Meridee Score

## 2021-10-13 ENCOUNTER — Encounter (HOSPITAL_BASED_OUTPATIENT_CLINIC_OR_DEPARTMENT_OTHER): Payer: Self-pay

## 2021-10-13 ENCOUNTER — Ambulatory Visit (HOSPITAL_BASED_OUTPATIENT_CLINIC_OR_DEPARTMENT_OTHER)
Admission: RE | Admit: 2021-10-13 | Discharge: 2021-10-13 | Disposition: A | Discharge status: Home / Self Care | Payer: Medicare HMO | Source: Ambulatory Visit | Attending: Gastroenterology | Admitting: Gastroenterology

## 2021-10-13 ENCOUNTER — Other Ambulatory Visit: Payer: Medicare HMO

## 2021-10-13 ENCOUNTER — Other Ambulatory Visit: Payer: Self-pay

## 2021-10-13 DIAGNOSIS — K5909 Other constipation: Secondary | ICD-10-CM

## 2021-10-13 DIAGNOSIS — R82998 Other abnormal findings in urine: Secondary | ICD-10-CM | POA: Insufficient documentation

## 2021-10-13 DIAGNOSIS — G8929 Other chronic pain: Secondary | ICD-10-CM

## 2021-10-13 DIAGNOSIS — Z8619 Personal history of other infectious and parasitic diseases: Secondary | ICD-10-CM | POA: Diagnosis not present

## 2021-10-13 DIAGNOSIS — R109 Unspecified abdominal pain: Secondary | ICD-10-CM | POA: Diagnosis present

## 2021-10-13 DIAGNOSIS — R17 Unspecified jaundice: Secondary | ICD-10-CM | POA: Insufficient documentation

## 2021-10-13 DIAGNOSIS — I130 Hypertensive heart and chronic kidney disease with heart failure and stage 1 through stage 4 chronic kidney disease, or unspecified chronic kidney disease: Secondary | ICD-10-CM | POA: Diagnosis present

## 2021-10-13 DIAGNOSIS — I739 Peripheral vascular disease, unspecified: Secondary | ICD-10-CM | POA: Diagnosis present

## 2021-10-13 DIAGNOSIS — R1010 Upper abdominal pain, unspecified: Secondary | ICD-10-CM | POA: Diagnosis not present

## 2021-10-13 DIAGNOSIS — R634 Abnormal weight loss: Secondary | ICD-10-CM | POA: Diagnosis not present

## 2021-10-13 DIAGNOSIS — D631 Anemia in chronic kidney disease: Secondary | ICD-10-CM | POA: Diagnosis present

## 2021-10-13 DIAGNOSIS — I679 Cerebrovascular disease, unspecified: Secondary | ICD-10-CM | POA: Diagnosis present

## 2021-10-13 DIAGNOSIS — Y848 Other medical procedures as the cause of abnormal reaction of the patient, or of later complication, without mention of misadventure at the time of the procedure: Secondary | ICD-10-CM | POA: Diagnosis not present

## 2021-10-13 DIAGNOSIS — R7989 Other specified abnormal findings of blood chemistry: Secondary | ICD-10-CM

## 2021-10-13 DIAGNOSIS — N179 Acute kidney failure, unspecified: Secondary | ICD-10-CM | POA: Diagnosis present

## 2021-10-13 DIAGNOSIS — I252 Old myocardial infarction: Secondary | ICD-10-CM | POA: Diagnosis not present

## 2021-10-13 DIAGNOSIS — Z7902 Long term (current) use of antithrombotics/antiplatelets: Secondary | ICD-10-CM | POA: Diagnosis not present

## 2021-10-13 DIAGNOSIS — K31A0 Gastric intestinal metaplasia, unspecified: Secondary | ICD-10-CM

## 2021-10-13 DIAGNOSIS — E039 Hypothyroidism, unspecified: Secondary | ICD-10-CM | POA: Diagnosis present

## 2021-10-13 DIAGNOSIS — N1831 Chronic kidney disease, stage 3a: Secondary | ICD-10-CM | POA: Diagnosis present

## 2021-10-13 DIAGNOSIS — R1011 Right upper quadrant pain: Secondary | ICD-10-CM | POA: Diagnosis not present

## 2021-10-13 DIAGNOSIS — Z7901 Long term (current) use of anticoagulants: Secondary | ICD-10-CM | POA: Diagnosis not present

## 2021-10-13 DIAGNOSIS — K760 Fatty (change of) liver, not elsewhere classified: Secondary | ICD-10-CM | POA: Diagnosis not present

## 2021-10-13 DIAGNOSIS — Z955 Presence of coronary angioplasty implant and graft: Secondary | ICD-10-CM | POA: Diagnosis not present

## 2021-10-13 DIAGNOSIS — I10 Essential (primary) hypertension: Secondary | ICD-10-CM | POA: Diagnosis not present

## 2021-10-13 DIAGNOSIS — E785 Hyperlipidemia, unspecified: Secondary | ICD-10-CM | POA: Diagnosis present

## 2021-10-13 DIAGNOSIS — Z0389 Encounter for observation for other suspected diseases and conditions ruled out: Secondary | ICD-10-CM | POA: Diagnosis not present

## 2021-10-13 DIAGNOSIS — K2950 Unspecified chronic gastritis without bleeding: Secondary | ICD-10-CM | POA: Diagnosis not present

## 2021-10-13 DIAGNOSIS — Z951 Presence of aortocoronary bypass graft: Secondary | ICD-10-CM | POA: Diagnosis not present

## 2021-10-13 DIAGNOSIS — R945 Abnormal results of liver function studies: Secondary | ICD-10-CM | POA: Diagnosis not present

## 2021-10-13 DIAGNOSIS — R1013 Epigastric pain: Secondary | ICD-10-CM

## 2021-10-13 DIAGNOSIS — Z823 Family history of stroke: Secondary | ICD-10-CM | POA: Diagnosis not present

## 2021-10-13 DIAGNOSIS — Z20822 Contact with and (suspected) exposure to covid-19: Secondary | ICD-10-CM | POA: Diagnosis present

## 2021-10-13 DIAGNOSIS — E440 Moderate protein-calorie malnutrition: Secondary | ICD-10-CM | POA: Diagnosis present

## 2021-10-13 DIAGNOSIS — K8071 Calculus of gallbladder and bile duct without cholecystitis with obstruction: Secondary | ICD-10-CM | POA: Diagnosis present

## 2021-10-13 DIAGNOSIS — K831 Obstruction of bile duct: Secondary | ICD-10-CM | POA: Diagnosis not present

## 2021-10-13 DIAGNOSIS — K859 Acute pancreatitis without necrosis or infection, unspecified: Secondary | ICD-10-CM | POA: Diagnosis not present

## 2021-10-13 DIAGNOSIS — K858 Other acute pancreatitis without necrosis or infection: Secondary | ICD-10-CM | POA: Diagnosis not present

## 2021-10-13 DIAGNOSIS — J439 Emphysema, unspecified: Secondary | ICD-10-CM | POA: Diagnosis present

## 2021-10-13 DIAGNOSIS — Z6829 Body mass index (BMI) 29.0-29.9, adult: Secondary | ICD-10-CM | POA: Diagnosis not present

## 2021-10-13 DIAGNOSIS — K9189 Other postprocedural complications and disorders of digestive system: Secondary | ICD-10-CM | POA: Diagnosis not present

## 2021-10-13 DIAGNOSIS — J9611 Chronic respiratory failure with hypoxia: Secondary | ICD-10-CM | POA: Diagnosis present

## 2021-10-13 DIAGNOSIS — K8050 Calculus of bile duct without cholangitis or cholecystitis without obstruction: Secondary | ICD-10-CM | POA: Diagnosis not present

## 2021-10-13 DIAGNOSIS — I2510 Atherosclerotic heart disease of native coronary artery without angina pectoris: Secondary | ICD-10-CM | POA: Diagnosis present

## 2021-10-13 DIAGNOSIS — K8070 Calculus of gallbladder and bile duct without cholecystitis without obstruction: Secondary | ICD-10-CM | POA: Diagnosis not present

## 2021-10-13 DIAGNOSIS — I700 Atherosclerosis of aorta: Secondary | ICD-10-CM | POA: Diagnosis not present

## 2021-10-13 DIAGNOSIS — Z8249 Family history of ischemic heart disease and other diseases of the circulatory system: Secondary | ICD-10-CM | POA: Diagnosis not present

## 2021-10-13 DIAGNOSIS — J449 Chronic obstructive pulmonary disease, unspecified: Secondary | ICD-10-CM | POA: Diagnosis not present

## 2021-10-13 MED ORDER — IOHEXOL 300 MG/ML  SOLN
75.0000 mL | Freq: Once | INTRAMUSCULAR | Status: AC | PRN
  Administered 2021-10-13: 75 mL via INTRAVENOUS

## 2021-10-14 ENCOUNTER — Other Ambulatory Visit: Payer: Self-pay

## 2021-10-14 ENCOUNTER — Emergency Department (HOSPITAL_COMMUNITY): Payer: Medicare HMO

## 2021-10-14 ENCOUNTER — Observation Stay (HOSPITAL_COMMUNITY): Payer: Medicare HMO

## 2021-10-14 ENCOUNTER — Encounter (HOSPITAL_COMMUNITY): Payer: Self-pay | Admitting: Emergency Medicine

## 2021-10-14 ENCOUNTER — Inpatient Hospital Stay (HOSPITAL_COMMUNITY)
Admission: EM | Admit: 2021-10-14 | Discharge: 2021-10-22 | DRG: 444 | Disposition: A | Discharge status: Home / Self Care | Payer: Medicare HMO | Attending: Family Medicine | Admitting: Family Medicine

## 2021-10-14 DIAGNOSIS — K859 Acute pancreatitis without necrosis or infection, unspecified: Secondary | ICD-10-CM

## 2021-10-14 DIAGNOSIS — E440 Moderate protein-calorie malnutrition: Secondary | ICD-10-CM | POA: Diagnosis present

## 2021-10-14 DIAGNOSIS — Y848 Other medical procedures as the cause of abnormal reaction of the patient, or of later complication, without mention of misadventure at the time of the procedure: Secondary | ICD-10-CM | POA: Diagnosis not present

## 2021-10-14 DIAGNOSIS — K8050 Calculus of bile duct without cholangitis or cholecystitis without obstruction: Secondary | ICD-10-CM | POA: Diagnosis present

## 2021-10-14 DIAGNOSIS — R1013 Epigastric pain: Secondary | ICD-10-CM

## 2021-10-14 DIAGNOSIS — E785 Hyperlipidemia, unspecified: Secondary | ICD-10-CM | POA: Diagnosis present

## 2021-10-14 DIAGNOSIS — Z955 Presence of coronary angioplasty implant and graft: Secondary | ICD-10-CM

## 2021-10-14 DIAGNOSIS — R7989 Other specified abnormal findings of blood chemistry: Secondary | ICD-10-CM | POA: Diagnosis not present

## 2021-10-14 DIAGNOSIS — J9611 Chronic respiratory failure with hypoxia: Secondary | ICD-10-CM | POA: Diagnosis present

## 2021-10-14 DIAGNOSIS — N179 Acute kidney failure, unspecified: Secondary | ICD-10-CM | POA: Diagnosis present

## 2021-10-14 DIAGNOSIS — Z7902 Long term (current) use of antithrombotics/antiplatelets: Secondary | ICD-10-CM

## 2021-10-14 DIAGNOSIS — Z823 Family history of stroke: Secondary | ICD-10-CM

## 2021-10-14 DIAGNOSIS — J439 Emphysema, unspecified: Secondary | ICD-10-CM | POA: Diagnosis present

## 2021-10-14 DIAGNOSIS — R1010 Upper abdominal pain, unspecified: Secondary | ICD-10-CM | POA: Diagnosis not present

## 2021-10-14 DIAGNOSIS — K858 Other acute pancreatitis without necrosis or infection: Secondary | ICD-10-CM | POA: Diagnosis not present

## 2021-10-14 DIAGNOSIS — I252 Old myocardial infarction: Secondary | ICD-10-CM

## 2021-10-14 DIAGNOSIS — Z833 Family history of diabetes mellitus: Secondary | ICD-10-CM

## 2021-10-14 DIAGNOSIS — Z87891 Personal history of nicotine dependence: Secondary | ICD-10-CM

## 2021-10-14 DIAGNOSIS — R1011 Right upper quadrant pain: Secondary | ICD-10-CM

## 2021-10-14 DIAGNOSIS — E039 Hypothyroidism, unspecified: Secondary | ICD-10-CM | POA: Diagnosis present

## 2021-10-14 DIAGNOSIS — K8070 Calculus of gallbladder and bile duct without cholecystitis without obstruction: Secondary | ICD-10-CM | POA: Diagnosis present

## 2021-10-14 DIAGNOSIS — I130 Hypertensive heart and chronic kidney disease with heart failure and stage 1 through stage 4 chronic kidney disease, or unspecified chronic kidney disease: Secondary | ICD-10-CM | POA: Diagnosis present

## 2021-10-14 DIAGNOSIS — Z20822 Contact with and (suspected) exposure to covid-19: Secondary | ICD-10-CM | POA: Diagnosis present

## 2021-10-14 DIAGNOSIS — K9189 Other postprocedural complications and disorders of digestive system: Secondary | ICD-10-CM | POA: Diagnosis not present

## 2021-10-14 DIAGNOSIS — N1831 Chronic kidney disease, stage 3a: Secondary | ICD-10-CM | POA: Diagnosis present

## 2021-10-14 DIAGNOSIS — Z419 Encounter for procedure for purposes other than remedying health state, unspecified: Secondary | ICD-10-CM

## 2021-10-14 DIAGNOSIS — I779 Disorder of arteries and arterioles, unspecified: Secondary | ICD-10-CM | POA: Diagnosis present

## 2021-10-14 DIAGNOSIS — J449 Chronic obstructive pulmonary disease, unspecified: Secondary | ICD-10-CM | POA: Diagnosis present

## 2021-10-14 DIAGNOSIS — I679 Cerebrovascular disease, unspecified: Secondary | ICD-10-CM | POA: Diagnosis present

## 2021-10-14 DIAGNOSIS — I25118 Atherosclerotic heart disease of native coronary artery with other forms of angina pectoris: Secondary | ICD-10-CM | POA: Diagnosis present

## 2021-10-14 DIAGNOSIS — I739 Peripheral vascular disease, unspecified: Secondary | ICD-10-CM | POA: Diagnosis present

## 2021-10-14 DIAGNOSIS — N189 Chronic kidney disease, unspecified: Secondary | ICD-10-CM

## 2021-10-14 DIAGNOSIS — R634 Abnormal weight loss: Secondary | ICD-10-CM | POA: Diagnosis not present

## 2021-10-14 DIAGNOSIS — R109 Unspecified abdominal pain: Secondary | ICD-10-CM

## 2021-10-14 DIAGNOSIS — D631 Anemia in chronic kidney disease: Secondary | ICD-10-CM | POA: Diagnosis present

## 2021-10-14 DIAGNOSIS — Z6829 Body mass index (BMI) 29.0-29.9, adult: Secondary | ICD-10-CM

## 2021-10-14 DIAGNOSIS — Z951 Presence of aortocoronary bypass graft: Secondary | ICD-10-CM

## 2021-10-14 DIAGNOSIS — Z8249 Family history of ischemic heart disease and other diseases of the circulatory system: Secondary | ICD-10-CM

## 2021-10-14 DIAGNOSIS — I2510 Atherosclerotic heart disease of native coronary artery without angina pectoris: Secondary | ICD-10-CM | POA: Diagnosis present

## 2021-10-14 DIAGNOSIS — G8929 Other chronic pain: Secondary | ICD-10-CM | POA: Diagnosis present

## 2021-10-14 DIAGNOSIS — K8071 Calculus of gallbladder and bile duct without cholecystitis with obstruction: Principal | ICD-10-CM | POA: Diagnosis present

## 2021-10-14 DIAGNOSIS — I10 Essential (primary) hypertension: Secondary | ICD-10-CM | POA: Diagnosis present

## 2021-10-14 LAB — HELICOBACTER PYLORI  SPECIAL ANTIGEN
MICRO NUMBER:: 13031257
SPECIMEN QUALITY: ADEQUATE

## 2021-10-14 LAB — CBC WITH DIFFERENTIAL/PLATELET
Abs Immature Granulocytes: 0.07 10*3/uL (ref 0.00–0.07)
Basophils Absolute: 0 10*3/uL (ref 0.0–0.1)
Basophils Relative: 1 %
Eosinophils Absolute: 0.1 10*3/uL (ref 0.0–0.5)
Eosinophils Relative: 2 %
HCT: 37.1 % — ABNORMAL LOW (ref 39.0–52.0)
Hemoglobin: 12.2 g/dL — ABNORMAL LOW (ref 13.0–17.0)
Immature Granulocytes: 1 %
Lymphocytes Relative: 17 %
Lymphs Abs: 1.3 10*3/uL (ref 0.7–4.0)
MCH: 32.3 pg (ref 26.0–34.0)
MCHC: 32.9 g/dL (ref 30.0–36.0)
MCV: 98.1 fL (ref 80.0–100.0)
Monocytes Absolute: 0.6 10*3/uL (ref 0.1–1.0)
Monocytes Relative: 8 %
Neutro Abs: 5.6 10*3/uL (ref 1.7–7.7)
Neutrophils Relative %: 71 %
Platelets: 438 10*3/uL — ABNORMAL HIGH (ref 150–400)
RBC: 3.78 MIL/uL — ABNORMAL LOW (ref 4.22–5.81)
RDW: 15.2 % (ref 11.5–15.5)
WBC: 7.8 10*3/uL (ref 4.0–10.5)
nRBC: 0 % (ref 0.0–0.2)

## 2021-10-14 LAB — URINALYSIS, ROUTINE W REFLEX MICROSCOPIC
Bacteria, UA: NONE SEEN
Bilirubin Urine: NEGATIVE
Glucose, UA: NEGATIVE mg/dL
Hgb urine dipstick: NEGATIVE
Ketones, ur: NEGATIVE mg/dL
Nitrite: NEGATIVE
Protein, ur: NEGATIVE mg/dL
Specific Gravity, Urine: 1.025 (ref 1.005–1.030)
pH: 5 (ref 5.0–8.0)

## 2021-10-14 LAB — BILIRUBIN, DIRECT: Bilirubin, Direct: 2.1 mg/dL — ABNORMAL HIGH (ref 0.0–0.2)

## 2021-10-14 LAB — COMPREHENSIVE METABOLIC PANEL
ALT: 144 U/L — ABNORMAL HIGH (ref 0–44)
AST: 132 U/L — ABNORMAL HIGH (ref 15–41)
Albumin: 3.1 g/dL — ABNORMAL LOW (ref 3.5–5.0)
Alkaline Phosphatase: 442 U/L — ABNORMAL HIGH (ref 38–126)
Anion gap: 8 (ref 5–15)
BUN: 21 mg/dL (ref 8–23)
CO2: 26 mmol/L (ref 22–32)
Calcium: 9.2 mg/dL (ref 8.9–10.3)
Chloride: 102 mmol/L (ref 98–111)
Creatinine, Ser: 1.54 mg/dL — ABNORMAL HIGH (ref 0.61–1.24)
GFR, Estimated: 46 mL/min — ABNORMAL LOW (ref >60)
Glucose, Bld: 90 mg/dL (ref 70–99)
Potassium: 4.2 mmol/L (ref 3.5–5.1)
Sodium: 136 mmol/L (ref 135–145)
Total Bilirubin: 4.2 mg/dL — ABNORMAL HIGH (ref 0.3–1.2)
Total Protein: 7.6 g/dL (ref 6.5–8.1)

## 2021-10-14 LAB — LIPASE, BLOOD: Lipase: 39 U/L (ref 11–51)

## 2021-10-14 LAB — RESP PANEL BY RT-PCR (FLU A&B, COVID) ARPGX2
Influenza A by PCR: NEGATIVE
Influenza B by PCR: NEGATIVE
SARS Coronavirus 2 by RT PCR: NEGATIVE

## 2021-10-14 MED ORDER — PANTOPRAZOLE SODIUM 40 MG PO TBEC
40.0000 mg | DELAYED_RELEASE_TABLET | Freq: Every day | ORAL | Status: DC
Start: 2021-10-15 — End: 2021-10-22
  Administered 2021-10-15 – 2021-10-22 (×8): 40 mg via ORAL
  Filled 2021-10-14 (×8): qty 1

## 2021-10-14 MED ORDER — NITROGLYCERIN 0.4 MG SL SUBL: 0.4000 mg | SUBLINGUAL_TABLET | SUBLINGUAL | Status: DC | PRN

## 2021-10-14 MED ORDER — GADOBUTROL 1 MMOL/ML IV SOLN
9.0000 mL | Freq: Once | INTRAVENOUS | Status: AC | PRN
  Administered 2021-10-14: 9 mL via INTRAVENOUS

## 2021-10-14 MED ORDER — LACTATED RINGERS IV SOLN: INTRAVENOUS | Status: DC

## 2021-10-14 MED ORDER — ACETAMINOPHEN 325 MG PO TABS
650.0000 mg | ORAL_TABLET | Freq: Four times a day (QID) | ORAL | Status: DC | PRN
Start: 2021-10-14 — End: 2021-10-22
  Administered 2021-10-15 – 2021-10-22 (×2): 650 mg via ORAL
  Filled 2021-10-14 (×2): qty 2

## 2021-10-14 MED ORDER — ONDANSETRON HCL 4 MG PO TABS
4.0000 mg | ORAL_TABLET | Freq: Four times a day (QID) | ORAL | Status: DC | PRN
  Administered 2021-10-15: 4 mg via ORAL
  Filled 2021-10-14: qty 1

## 2021-10-14 MED ORDER — ONDANSETRON HCL 4 MG/2ML IJ SOLN: 4.0000 mg | Freq: Four times a day (QID) | INTRAMUSCULAR | Status: DC | PRN

## 2021-10-14 MED ORDER — ACETAMINOPHEN 650 MG RE SUPP: 650.0000 mg | Freq: Four times a day (QID) | RECTAL | Status: DC | PRN

## 2021-10-14 MED ORDER — LEVOTHYROXINE SODIUM 75 MCG PO TABS
75.0000 ug | ORAL_TABLET | Freq: Every day | ORAL | Status: DC
  Administered 2021-10-16 – 2021-10-22 (×6): 75 ug via ORAL
  Filled 2021-10-14 (×7): qty 1

## 2021-10-14 MED ORDER — FENOFIBRATE 54 MG PO TABS
54.0000 mg | ORAL_TABLET | Freq: Every day | ORAL | Status: DC
  Administered 2021-10-15 – 2021-10-22 (×8): 54 mg via ORAL
  Filled 2021-10-14 (×8): qty 1

## 2021-10-14 MED ORDER — METOPROLOL TARTRATE 25 MG PO TABS
25.0000 mg | ORAL_TABLET | Freq: Two times a day (BID) | ORAL | Status: DC
  Administered 2021-10-14 – 2021-10-22 (×14): 25 mg via ORAL
  Filled 2021-10-14 (×15): qty 1

## 2021-10-14 NOTE — Assessment & Plan Note (Addendum)
COPD Chronic hypoxic respiratory failure on 4 L: COPD compensated continue home oxygen 4 L and inhalers.  Per PCP he declined seeing pulmonary for PFTs.  I-S OOB monitor perioperative complication

## 2021-10-14 NOTE — Assessment & Plan Note (Addendum)
BP stable on metoprolol.  

## 2021-10-14 NOTE — Assessment & Plan Note (Addendum)
Suspect patient presented with AKI due to his GI disease nausea vomiting.  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

## 2021-10-14 NOTE — Hospital Course (Addendum)
76 year old gentleman with medical history of CAD multiple vessel with CABG, PCI, carotid artery occlusion, hypertension, HLD, PAD, hypertension, COPD/chronic respiratory failure on 4 L nasal cannula, gout followed by Dr. Rush Landmark for abdominal pain, with H. pylori antigen positive treated and eradicated confirmed with EGD on 07/15/2019 has had abnormal LFTs for several months and recently worsened, underwent further work-up with CT scan of the abdomen pelvis and still jaundiced and directed to the ED for admission. In the ED, hemodynamically stable, on room air, labs shows creatinine at 1.5 alk phos 442 AST 132 ALT 144 total bili 4.2-Labs on 215 over alk phos 426, AST 180 ALT 197 and TB 7.1 GGT 330. GI was consulted s/p  MRCP - showed cholelithiasis and choledocholithiasis.  Monitor closely with serial LFTs, had leukocytosis placed on IV antibiotics with Zosyn, patient remains hospitalized for ERCP after Plavix washout on 10/20/21.-ERCP completed 2/27 after Plavix washout, complete removal of stone with biliary sphincterotomy and balloon extraction.

## 2021-10-14 NOTE — Assessment & Plan Note (Deleted)
See CAD above

## 2021-10-14 NOTE — Consult Note (Addendum)
[facility] Gastroenterology Consult Note   History Michael [name] MRN # [id number]  Date of Admission: 10/14/2021 Date of Consultation: 10/14/2021 Referring physician: Dr. Dorie Rank, MD Primary Care Provider: Lorrene Reid, PA-C Primary Gastroenterologist: Dr. Justice Britain   Reason for Consultation/Chief Complaint: Abdominal pain, weight loss and jaundice  Subjective  HPI:  This is a 76 year old man known to Dr. Rush Landmark from outpatient evaluation over the last several months.  He has about a year of frequent mid upper abdominal pain, usually worsened within a couple hours after meals, and in the last few months food avoidance with weight loss.  Stool H. pylori antigen was positive, antibiotic treatment given, and eradication confirmed on upper endoscopy biopsies on 07/14/2021.  He had a modest transaminitis several months ago but on recent testing significant escalation of LFTs after being seen in the office last week with Dr. Rush Landmark.  CT scan abdomen and pelvis done with results as below.  Patient and his daughter were contacted by Dr. Rush Landmark with the CT report, patient had ongoing symptoms that were of concern and was reportedly still jaundiced, thus was referred to ED for evaluation.  He has not had fever or chills.  ROS:  Constitutional: Unspecified weight loss Eyes:   Icterus  Respiratory:   Chronic dyspnea at rest with exertion, requiring supplemental oxygen. Generalized fatigue, worsening over the last year  All other systems are negative except as noted above in the HPI  Past Medical History Past Medical History:  Diagnosis Date   Arthritis    "right hip; right wrist" (11/24/2016)   CAD, multiple vessel 05/2011   90% mid LAD with diffuse proximal stenosis, 100% proximal RCA --> s/p CABG x 2 (LIMA-LAD, SVG-dRCA); b) Lexiscan Myoview 09/2016 w/ Inferior-Inferolateral defect -> Cath:  11/24/16: 100% CTO of SVG-RCA (but nagtive /RCA only subtotal occlusion) --> PCI  with DES x2 (resolute onyx DES 2.25 mm x 30 mm, 2.5 mm x 20 mm) RCA   Carotid artery occlusion    COPD (chronic obstructive pulmonary disease) with emphysema (HCC)    Essential hypertension    History of gout    History of tobacco abuse    quit 05/2011   Hyperlipidemia with target LDL less than 70    Hypothyroidism    MI (myocardial infarction) ([place]) 05/2011   "I had 3 in 2 days"   NSTEMI (non-ST elevated myocardial infarction) ([place]) 06/05/2011   With CHF - probably late presentation for missed STEMI --> CATH- MV CAD;; Echo 07/2001: EF > 55%, mild MAC, mild TR, no WMA  with Post-op Setpal dyskinesis, Apex not seen)   PAD (peripheral artery disease) ([place]) 06/08/2011   a) R Common Iliac 100%, L Common Iliac 80+% occlusion; b) L CIA - Aterectomy- 10 mm x 40 mm self-extending Stent (postdilated to 7 mm with 20-30% residual)--> Deferred R AoFem bypass;; c) LEA Dopplers 04/2012 & 04/2014 - RABI 0.8 & LABI 1.1 - STABLE, no change   Pneumonia 1940s X 1   Presence of drug coated stent in right coronary artery 11/24/2016   p-mRCA: Resolute Onyx DES 2.25 mm x 38 mm --> 2.5 mm x 18 mm (post-dilated from 2.7-2.8 mm)    Past Surgical History Past Surgical History:  Procedure Laterality Date   BIOPSY  07/14/2021   Procedure: BIOPSY;  Surgeon: Irving Copas., MD;  Location: [facility];  Service: Gastroenterology;;   CORONARY ANGIOPLASTY WITH STENT PLACEMENT  11/24/2016   with 100% SVG-RCA & flow in native RCA now present -->  PCI-p-m RCA 2 Overlapping Resolute DES (2.25 x 38, 1.5 x 18)   CORONARY ARTERY BYPASS GRAFT  06/10/2011   oLIMA to LAD, SVG to PDA (Dr. Servando Snare)   [place] N/A 11/24/2016   Procedure: Coronary Stent Intervention;  Surgeon: Leonie Man, MD;  Location: [place] CV LAB: DES RESOLUTE ONYX 2.25X38 & overlapping DES RESOLUTE ONYX 2.5X18    ESOPHAGOGASTRODUODENOSCOPY (EGD) WITH PROPOFOL N/A 07/14/2021   Procedure: ESOPHAGOGASTRODUODENOSCOPY (EGD) WITH  PROPOFOL;  Surgeon: Irving Copas., MD;  Location: [facility];  Service: Gastroenterology;  Laterality: N/A;   ILIAC ARTERY STENT  10/27/2011   (Dr. Ellyn Hack Dr. Gwenlyn Found) left CIA - 33mx40mm self-expanding stent, decreasing downt to 20-30% lesion, 100% occluded R CIA with extensive lumbar collateralization to R internal iliac;  05/2016: Ao-Ileac & LEA Dopplers: RABI 0.8, LABI 1.2. B TBI 0.77 -- he has known B disease s/p L CIA stent. RLABI decreased and LABI is normal; aortoiliac atherosclerosis w/ CTO of R CIA but normal REIA.   LEFT HEART CATH AND CORONARY ANGIOGRAPHY  06/09/2011   90% mid LAD with diffuse proximal stenosis, 100% proximal RCA --> s/p CABG x 2 (LIMA-LAD, SVG-dRCA);   LEFT HEART CATH AND CORS/GRAFTS ANGIOGRAPHY N/A 11/24/2016   Procedure: LEFT HEART CATH AND CORS/GRAFTS ANGIOGRAPHY;  Surgeon: DLeonie Man MD;  [facility]: 100% SVG-RCA, but now native RCA only sub-CTO with antegrade flow.  85% mLAD after D1. 40% oOM1 --> PCI of Native RCA   NM MYOVIEW LTD  09/2016    EF 62%. Medium sized, moderate severity defect in the basal inferior, mid inferoseptal and mid inferior walls is partially reversible. - INTERMEDIATE RISK -- CATH   PERCUTANEOUS STENT INTERVENTION Left 10/27/2011   Procedure: PERCUTANEOUS STENT INTERVENTION;  Surgeon: JLorretta Harp MD / DLeonie Man MD;  [facility];  Service: Cardiovascular;  Laterality: Left;   SAVORY DILATION N/A 07/14/2021   Procedure: SAVORY DILATION;  Surgeon: MRush LandmarkGTelford Nab, MD;  [facility];  Service: Gastroenterology;  Laterality: N/A;   TONSILLECTOMY  ~ 1BonnerECHOCARDIOGRAM  08/19/2011   EF=>55%; mild mitral annular calcif; mild TR    Family History Family History  Problem Relation Age of Onset   Heart disease Mother        valve replacement   Diabetes Mother    Heart failure Mother    Pneumonia Father    CAD Sister        cardiac stent   Stroke Sister         stents in neck   Colon cancer Neg Hx    Esophageal cancer Neg Hx    Inflammatory bowel disease Neg Hx    Liver disease Neg Hx    Pancreatic cancer Neg Hx    Rectal cancer Neg Hx    Stomach cancer Neg Hx     Social History Social History   Socioeconomic History   Marital status: Divorced    Spouse name: Not on file   Number of children: 3   Years of education: Not on file   Highest education level: Not on file  Occupational History   Not on file  Tobacco Use   Smoking status: Former    Packs/day: 2.00    Years: 51.50    Pack years: 103.00    Types: Cigarettes    Quit date: 06/08/2011    Years since quitting: 10.3   Smokeless tobacco: Former    Types:  Sarina Ser    Quit date: 07/13/2003  Vaping Use   Vaping Use: Never used  Substance and Sexual Activity   Alcohol use: No    Comment: 11/24/2016 "nothing in 2 1/2 years; never an alcoholic"   Drug use: No   Sexual activity: Not Currently  Other Topics Concern   Not on file  Social History Narrative   Not on file   Social Determinants of Health   Financial Resource Strain: Not on file  Food Insecurity: Not on file  Transportation Needs: Not on file  Physical Activity: Not on file  Stress: Not on file  Social Connections: Not on file    Allergies No Known Allergies  Outpatient Meds Home medications from the H+P and/or nursing med reconciliation reviewed.  Inpatient med list reviewed  _____________________________________________________________________ Objective   Exam:  Current vital signs  Patient Vitals for the past 8 hrs:  BP Temp Temp src Pulse Resp SpO2  10/14/21 1221 127/76 (!) 97.5 F (36.4 C) Oral 76 16 93 %   No intake or output data in the 24 hours ending 10/14/21 1530  Physical Exam:  His daughter is present at the bedside for my entire visit. General: this is a chronically ill-appearing elderly male patient in no acute distress.  He is alert and conversational Eyes: sclera mildly  icteric, no redness ENT: oral mucosa moist without lesions, no cervical or supraclavicular lymphadenopathy, good dentition CV: RRR without murmur, S1/S2, no JVD,, no peripheral edema Resp: Decreased breath sounds mid to lower left lung field, and expiratory wheezing on right.  Breathing comfortably at rest on supplemental oxygen. GI: soft, obese, no tenderness, with active bowel sounds. No guarding or palpable organomegaly noted.  No bruit appreciated Skin; warm and dry, no rash - light  jaundice noted Neuro: awake, alert and oriented x 3. Normal gross motor function and fluent speech.  Labs:  CBC Latest Ref Rng & Units 10/14/2021 10/08/2021 05/23/2021  WBC 4.0 - 10.5 K/uL 7.8 6.9 8.0  Hemoglobin 13.0 - 17.0 g/dL 12.2(L) 12.3(L) 13.4  Hematocrit 39.0 - 52.0 % 37.1(L) 36.4(L) 41.7  Platelets 150 - 400 K/uL 438(H) 350.0 507(H)    CMP Latest Ref Rng & Units 10/14/2021 10/08/2021 06/06/2021  Glucose 70 - 99 mg/dL 90 103(H) 98  BUN 8 - 23 mg/dL _0 Creatinine 0.61 - 1.24 mg/dL 1.54(H) 1.46 1.46  Sodium 135 - 145 mmol/L 136 136 137  Potassium 3.5 - 5.1 mmol/L 4.2 4.2 3.9  Chloride 98 - 111 mmol/L 102 100 104  CO2 22 - 32 mmol/L _1 Calcium 8.9 - 10.3 mg/dL 9.2 9.3 9.5  Total Protein 6.5 - 8.1 g/dL 7.6 7.3 7.3  Total Bilirubin 0.3 - 1.2 mg/dL 4.2(H) 7.1(H) 0.7  Alkaline Phos 38 - 126 U/L 442(H) 426(H) 120(H)  AST 15 - 41 U/L 132(H) 180(H) 26  ALT 0 - 44 U/L 144(H) 197(H) 44   On 06/06/2021, ANA positive but at low titer of 1-40, negative AMA, negative TTG antibody, negative smooth muscle antibody  Negative Cologuard last year   No results for input(s): INR in the last 168 hours. _________________________________________________________ Radiologic studies:  CLINICAL DATA:  Abdominal pain, jaundice and abnormal LFTs.   EXAM: CT ABDOMEN AND PELVIS WITH CONTRAST   TECHNIQUE: Multidetector CT imaging of the abdomen and pelvis was performed using the standard protocol  following bolus administration of intravenous contrast.   RADIATION DOSE REDUCTION: This exam was performed according to the departmental dose-optimization program  which includes automated exposure control, adjustment of the mA and/or kV according to patient size and/or use of iterative reconstruction technique.   CONTRAST:  78m OMNIPAQUE IOHEXOL 300 MG/ML  SOLN   COMPARISON:  CTA chest, abdomen and pelvis 08/03/2020   FINDINGS: Lower chest: No acute abnormality. Emphysematous changes with subpleural fibrosis and honeycombing are again noted, with small chronic loculated posterior basal left pleural fluid collection and chronic lower lobe posterior atelectasis greater on the left. The heart is slightly enlarged. There is coronary artery calcification and old CABG.   Hepatobiliary: The liver is normal in size with mild steatosis seen. No liver mass is evident. There are small capsular calcifications again noted posteriorly. There is no intrahepatic biliary prominence but is extrahepatic biliary dilatation with the common bile duct 9 mm.   There is no visible ductal filling defect. Gallbladder partially contracted but no wall thickening, stones or adjacent edema noted.   Pancreas: No ductal dilatation or mass is seen. The pancreas partially fatty atrophic. No adjacent edema.   Spleen: No mass or splenomegaly.   Adrenals/Urinary Tract: There is no adrenal mass. Congenital malrotation again noted left kidney. Small 6 mm stable hypodensity in the posterior right kidney is unchanged but too small to characterize. There are no stones or hydronephrosis. Both kidneys slightly small in length, both measuring 8.9 cm length. There is cortical thinning. There is no bladder thickening.   Stomach/Bowel: Small hiatal hernia. There is no dilatation or wall thickening including the appendix. Left colonic diverticulosis is seen without evidence of colitis or diverticulitis most  advanced diverticular disease in the sigmoid segment.   Vascular/Lymphatic: There is heavy aortoiliac calcific plaque. 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 No enlarged lymph nodes are seen.   Reproductive: There are calcifications of the prostate but no prostatomegaly.   Other: Small umbilical hernia. Small left inguinal fat hernia. No free air, fluid or hemorrhage.   Musculoskeletal: There are degenerative changes in the thoracic and lumbar spine. Advanced degenerative disc change with spondylosis and facet hypertrophy at L5-S1 is associated with severe foraminal stenosis. There is mild hip DJD.   IMPRESSION: 1. Increasingly prominent common bile duct without a visible filling defect or pancreatic head mass. MRCP may be helpful for further evaluation. 2. Mild hepatic steatosis.  No mass enhancement. 3. Aortic atherosclerosis, with chronically occluded right common iliac artery. 4. Diverticulosis without evidence of acute diverticulitis. 5. Umbilical and left inguinal fat hernias. 6. Emphysematous and chronic change. 7. Additional findings as described above.     Electronically Signed   By: KTelford NabM.D.   On: 10/14/2021 01:19 ______________________________________   CLINICAL DATA:  Elevated LFTs   EXAM: ULTRASOUND ABDOMEN LIMITED RIGHT UPPER QUADRANT   COMPARISON:  CT 10/13/2021   FINDINGS: Gallbladder:   Incompletely distended. No definite stones. Sonographer describes no sonographic Murphy sign.   Common bile duct:   Diameter: 2.4 mm, unremarkable   Liver:   No focal lesion identified. Within normal limits in parenchymal echogenicity. No intrahepatic biliary ductal dilatation evident. Portal vein is patent on color Doppler imaging with normal direction of blood flow towards the liver.   Other: Technologist describes  technically difficult study secondary to bowel gas and body habitus.   IMPRESSION: Negative     Electronically Signed   By: DLucrezia EuropeM.D.   On: 10/14/2021 14:35  ______________________________________________________ Other studies:   _______________________________________________________ Assessment &  Plan  Impression: Chronic epigastric pain, sometimes with associated nausea, rare vomiting.  Weight loss, presumably related to these ongoing upper digestive symptoms, however patient also has multiple other chronic medical conditions.  Elevated LFTs in a mixed pattern, though now increasingly concerning for extrahepatic biliary obstruction, especially in the setting of upper abdominal pain. Ultrasound findings may not be accurate given the reported technical limitations.  The recent CT scan showed modest extrahepatic biliary ductal dilatation, reportedly a change from her prior scan.  No clear calcified gallbladder stones on that scan, though gallbladder partially contracted.  His bilirubin is somewhat lower today than last week, but I suspect this may represent a ball valving CBD stone.  Given the patient's poor overall condition and reported weight loss, there should also be concern for underlying biliary stricture even though no clear pancreatic or bile duct mass was seen on the CT scan.Marland Kitchen  He is an elderly man with multiple severe comorbidities, significantly increasing the risk of anesthesia for invasive endoscopic procedures.  Plan:  ED physician spoke with Dr. Lyndel Safe earlier, whose advice was for an MRI of the abdomen/MRCP.  This might be a limited quality study given the patient's respiratory condition and probable difficulty with breath-holding, but I agree it is worth obtaining the scan to see if it gives [place] more definitive evidence of a CBD stone.  If so, patient would need an ERCP, though timing to be determined since he is on Plavix.  I recommend this patient be admitted  to the medical service for observation, which will allow [place] the opportunity to obtain the MRI, see what his labs look like tomorrow morning, and then make a further plan about the possible need for ERCP.  (As well as decision on inpatient versus outpatient) no current fever or leukocytosis to suggest cholangitis, so no antibiotics at present.  He can be given a low-fat diet after his MRCP.  We will follow him closely, I will discuss his case with Dr. Rush Landmark after further test results, and we appreciate the opportunity to be involved in his care.  Thank you for the courtesy of this consult.  Please contact me with any questions or concerns.  Nelida Meuse III Office: [phone]

## 2021-10-14 NOTE — Assessment & Plan Note (Deleted)
See CAD above °

## 2021-10-14 NOTE — H&P (Addendum)
History and Physical    Patient: Michael [name] PYP:[id number] DOB: 08-08-1946 DOA: 10/14/2021 DOS: the patient was seen and examined on 10/14/2021 PCP: Lorrene Reid, PA-C  Patient coming from: Home  Chief Complaint:  Chief Complaint  Patient presents with   Abdominal Pain   abnormal labs    HPI: 76 year old gentleman with medical history of CAD multiple vessel with CABG, PCI, carotid artery occlusion, hypertension, HLD, PAD, hypertension, COPD/chronic respiratory failure on 4 L nasal cannula, gout followed by Dr. Rush Landmark for abdominal pain, with H. pylori antigen positive treated and eradicated confirmed with EGD on 07/15/2019 has had abnormal LFTs for several months and recently worsened, underwent further work-up with CT scan of the abdomen pelvis and still jaundiced and directed to the ED for admission. In the ED, hemodynamically stable, on room air, labs shows creatinine at 1.5 alk phos 442 AST 132 ALT 144 total bili 4.2-Labs on 215 over alk phos 426, AST 180 ALT 197 and TB 7.1 GGT 330. GI was consulted in the ED planning for MRCP observation admission and repeat labs in a.m. to see if he needs ERCP or not.  Patient otherwise denies any pain in chest,  shortness of breath fever chills focal weakness dysuria, vomiting. Daughter at the bedside, he lives by himself independent of ADL  Review of Systems: As mentioned in the history of present illness. All other systems reviewed and are negative. Past Medical History:  Diagnosis Date   Arthritis    "right hip; right wrist" (11/24/2016)   CAD, multiple vessel 05/2011   90% mid LAD with diffuse proximal stenosis, 100% proximal RCA --> s/p CABG x 2 (LIMA-LAD, SVG-dRCA); b) Lexiscan Myoview 09/2016 w/ Inferior-Inferolateral defect -> Cath:  11/24/16: 100% CTO of SVG-RCA (but nagtive /RCA only subtotal occlusion) --> PCI with DES x2 (resolute onyx DES 2.25 mm x 30 mm, 2.5 mm x 20 mm) RCA   Carotid artery occlusion    COPD (chronic obstructive  pulmonary disease) with emphysema (HCC)    Essential hypertension    History of gout    History of tobacco abuse    quit 05/2011   Hyperlipidemia with target LDL less than 70    Hypothyroidism    MI (myocardial infarction) ([place]) 05/2011   "I had 3 in 2 days"   NSTEMI (non-ST elevated myocardial infarction) ([place]) 06/05/2011   With CHF - probably late presentation for missed STEMI --> CATH- MV CAD;; Echo 07/2001: EF > 55%, mild MAC, mild TR, no WMA  with Post-op Setpal dyskinesis, [place] not seen)   PAD (peripheral artery disease) ([place]) 06/08/2011   a) R Common Iliac 100%, L Common Iliac 80+% occlusion; b) L CIA - Aterectomy- 10 mm x 40 mm self-extending Stent (postdilated to 7 mm with 20-30% residual)--> Deferred R AoFem bypass;; c) LEA Dopplers 04/2012 & 04/2014 - RABI 0.8 & LABI 1.1 - STABLE, no change   Pneumonia 1940s X 1   Presence of drug coated stent in right coronary artery 11/24/2016   p-mRCA: Resolute Onyx DES 2.25 mm x 38 mm --> 2.5 mm x 18 mm (post-dilated from 2.7-2.8 mm)   Past Surgical History:  Procedure Laterality Date   BIOPSY  07/14/2021   Procedure: BIOPSY;  Surgeon: Irving Copas., MD;  Location: [facility];  Service: Gastroenterology;;   CORONARY ANGIOPLASTY WITH STENT PLACEMENT  11/24/2016   with 100% SVG-RCA & flow in native RCA now present --> PCI-p-m RCA 2 Overlapping Resolute DES (2.25 x 38, 1.5 x 18)  CORONARY ARTERY BYPASS GRAFT  06/10/2011   oLIMA to LAD, SVG to PDA (Dr. Servando Snare)   [place] N/A 11/24/2016   Procedure: Coronary Stent Intervention;  Surgeon: Leonie Man, MD;  Location: [facility]: DES RESOLUTE ONYX 2.25X38 & overlapping DES RESOLUTE ONYX 2.5X18    ESOPHAGOGASTRODUODENOSCOPY (EGD) WITH PROPOFOL N/A 07/14/2021   Procedure: ESOPHAGOGASTRODUODENOSCOPY (EGD) WITH PROPOFOL;  Surgeon: Irving Copas., MD;  Location: [facility];  Service: Gastroenterology;  Laterality: N/A;   ILIAC ARTERY STENT  10/27/2011    (Dr. Ellyn Hack Dr. Gwenlyn Found) left CIA - 11mx40mm self-expanding stent, decreasing downt to 20-30% lesion, 100% occluded R CIA with extensive lumbar collateralization to R internal iliac;  05/2016: Ao-Ileac & LEA Dopplers: RABI 0.8, LABI 1.2. B TBI 0.77 -- he has known B disease s/p L CIA stent. RLABI decreased and LABI is normal; aortoiliac atherosclerosis w/ CTO of R CIA but normal REIA.   LEFT HEART CATH AND CORONARY ANGIOGRAPHY  06/09/2011   90% mid LAD with diffuse proximal stenosis, 100% proximal RCA --> s/p CABG x 2 (LIMA-LAD, SVG-dRCA);   LEFT HEART CATH AND CORS/GRAFTS ANGIOGRAPHY N/A 11/24/2016   Procedure: LEFT HEART CATH AND CORS/GRAFTS ANGIOGRAPHY;  Surgeon: DLeonie Man MD;  [facility]: 100% SVG-RCA, but now native RCA only sub-CTO with antegrade flow.  85% mLAD after D1. 40% oOM1 --> PCI of Native RCA   NM MYOVIEW LTD  09/2016    EF 62%. Medium sized, moderate severity defect in the basal inferior, mid inferoseptal and mid inferior walls is partially reversible. - INTERMEDIATE RISK -- CATH   PERCUTANEOUS STENT INTERVENTION Left 10/27/2011   Procedure: PERCUTANEOUS STENT INTERVENTION;  Surgeon: JLorretta Harp MD / DLeonie Man MD;  [facility];  Service: Cardiovascular;  Laterality: Left;   SAVORY DILATION N/A 07/14/2021   Procedure: SAVORY DILATION;  Surgeon: MRush LandmarkGTelford Nab, MD;  [facility];  Service: Gastroenterology;  Laterality: N/A;   TONSILLECTOMY  ~ 1RuffinECHOCARDIOGRAM  08/19/2011   EF=>55%; mild mitral annular calcif; mild TR   Social History:  reports that he quit smoking about 10 years ago. His smoking use included cigarettes. He has a 103.00 pack-year smoking history. He quit smokeless tobacco use about 18 years ago.  His smokeless tobacco use included chew. He reports that he does not drink alcohol and does not use drugs.  No Known Allergies  Family History  Problem Relation Age of Onset   Heart  disease Mother        valve replacement   Diabetes Mother    Heart failure Mother    Pneumonia Father    CAD Sister        cardiac stent   Stroke Sister        stents in neck   Colon cancer Neg Hx    Esophageal cancer Neg Hx    Inflammatory bowel disease Neg Hx    Liver disease Neg Hx    Pancreatic cancer Neg Hx    Rectal cancer Neg Hx    Stomach cancer Neg Hx     Prior to Admission medications   Medication Sig Start Date End Date Taking? Authorizing Provider  atorvastatin (LIPITOR) 20 MG tablet Take 1 tablet (20 mg total) by mouth at bedtime. 09/24/21   HLeonie Man MD  clopidogrel (PLAVIX) 75 MG tablet Take 1 tablet (75 mg total) by mouth daily. 07/16/21   Mansouraty, GTelford Nab, MD  diphenhydrAMINE (BENADRYL) 25 MG tablet Take 25 mg by mouth daily at 12 noon. Allergy medication from [place]    [provider]  esomeprazole (NEXIUM) 40 MG capsule Take 1 capsule (40 mg total) by mouth daily. 10/08/21   Mansouraty, Telford Nab., MD  fenofibrate (TRICOR) 48 MG tablet Take 1 tablet (48 mg total) by mouth daily. 08/20/21   Leonie Man, MD  levothyroxine (SYNTHROID) 75 MCG tablet TAKE 1 TABLET BY MOUTH DAILY 09/08/21   Lorrene Reid, PA-C  metoprolol tartrate (LOPRESSOR) 25 MG tablet Take 1 tablet (25 mg total) by mouth 2 (two) times daily. 07/25/21   Leonie Man, MD  nitroGLYCERIN (NITROSTAT) 0.4 MG SL tablet Place 1 tablet (0.4 mg total) under the tongue every 5 (five) minutes as needed. After 3 doses, if no relief dial 911 03/09/19   Opalski, Deborah, DO  OXYGEN Inhale 5 L into the lungs continuous.    [provider]  Vitamin D, Ergocalciferol, (DRISDOL) 1.25 MG (50000 UNIT) CAPS capsule TAKE 1 CAPSULE BY MOUTH EVERY 7 DAYS 07/21/21   Lorrene Reid, PA-C    Physical Exam: Vitals:   10/14/21 1221 10/14/21 1530  BP: 127/76 (!) 109/92  Pulse: 76 74  Resp: 16 18  Temp: (!) 97.5 F (36.4 C)   TempSrc: Oral   SpO2: 93% 96%   General exam: AA0X3,  older than stated age, weak appearing. HEENT:Oral mucosa moist, Ear/Nose WNL grossly, dentition normal. Respiratory system: bilaterally diminished, no use of accessory muscle Cardiovascular system: S1 & S2 +, No JVD,. Gastrointestinal system: Abdomen soft,NT,ND,BS+ Nervous System:Alert, awake, moving extremities and grossly nonfocal Extremities: LE ankle edema none, distal peripheral pulses palpable.  Skin: No rashes,no icterus. MSK: Normal muscle bulk,tone, power   Data Reviewed:  Recent Labs  Lab 10/08/21 1046 10/14/21 1407  HGB 12.3* 12.2*  HCT 36.4* 37.1*  WBC 6.9 7.8  PLT 350.0 438*    Recent Labs  Lab 10/08/21 1046 10/14/21 1407  AST 180* 132*  ALT 197* 144*  ALKPHOS 426* 442*  BILITOT 7.1* 4.2*  PROT 7.3 7.6  ALBUMIN 3.6 3.1*    Recent Labs  Lab 10/08/21 1046 10/14/21 1407  NA 136 136  K 4.2 4.2  CL 100 102  CO2 29 26  GLUCOSE 103* 90  BUN 13 21  CREATININE 1.46 1.54*  CALCIUM 9.3 9.2   Right upper quadrant ultrasound 2/21 negative CT ABD 10/14/21 IMPRESSION: 1. Increasingly prominent common bile duct without a visible filling defect or pancreatic head mass. MRCP may be helpful for further evaluation. 2. Mild hepatic steatosis.  No mass enhancement. 3. Aortic atherosclerosis, with chronically occluded right common iliac artery. 4. Diverticulosis without evidence of acute diverticulitis. 5. Umbilical and left inguinal fat hernias. 6. Emphysematous and chronic change. 7. Additional findings as described above.  Assessment and Plan: * Pain in the abdomen- (present on admission) Mid upper abdominal pain-with postprandial worsening Abnormal LFTs Nausea with rare vomiting Weight loss presumably due to GI issues: GI has been consulted repeat CT scan, was treated for H. pylori last year and eradicated.  Plan for MRCP repeat LFTs in a.m.supportive care PPI antiemetics, gentle IV fluid hydration. Dietitian consult.Currently afebrile no leukocytosis no  indication of antibiotics   Abnormal LFTs- (present on admission) See pain in abdomen above  Chronic respiratory failure with hypoxia (HCC) - SaO2 drops to 87% with ambulation- (present on admission) See COPD above  Chronic obstructive pulmonary disease ([place])- (present on admission) COPD Chronic hypoxic respiratory failure on  4 L: Not in exacerbation resume home inhalers home oxygen  CAD, multiple vessel - status post CABG: LIMA-LAD, SVG-RPDA; PCI native RCA for 100% SVG-RCA- (present on admission) CAD with history of CABG and stent HLD PVD History of carotid stenosis: Currently no chest pain.  Resume patient's home metoprolol and plavix in am if no procedure. Hold statins due to abnormal LFTs.  Essential hypertension- (present on admission) Blood pressure stable resume home metoprolol  Hyperlipidemia with target LDL less than 70- (present on admission) See CAD above  Peripheral arterial occlusive disease ([place])- (present on admission) See CAD above  Stage 3a chronic kidney disease (CKD) (HCC) Baseline creatinine 1.3-1.5 stable.  Gentle hydration overnight. Recent Labs  Lab 10/08/21 1046 10/14/21 1407  BUN 13 21  CREATININE 1.46 1.54*    Hypothyroidism- (present on admission) Resume Synthroid  Advance Care Planning:   Code Status: Full Code  Consults: gi  Family Communication: daughter  Severity of Illness: The appropriate patient status for this patient is OBSERVATION. Observation status is judged to be reasonable and necessary in order to provide the required intensity of service to ensure the patient's safety. The patient's presenting symptoms, physical exam findings, and initial radiographic and laboratory data in the context of their medical condition is felt to place them at decreased risk for further clinical deterioration. Furthermore, it is anticipated that the patient will be medically stable for discharge from the hospital within 2 midnights of admission.    Author: Antonieta Pert, MD 10/14/2021 4:31 PM  For on call review [email or web address].

## 2021-10-14 NOTE — Assessment & Plan Note (Addendum)
Cont synthroid 

## 2021-10-14 NOTE — Assessment & Plan Note (Deleted)
See #1 pain in abdomen

## 2021-10-14 NOTE — Assessment & Plan Note (Addendum)
Cholelithiasis and choledocholithiasis symptomatic Elevated LFTs due to choledocholithiasis Abdominal pain nausea and weight loss: MRCP:cholelithiasis and choledocholithiasis.Symptomatic w/ abdominal pain nausea, elevated LFTs- s/p Zosyn due to concern of infection given significant leukocytosis that has resolved-ERCP completed 2/27 after Plavix washout, complete removal of stone with biliary sphincterotomy and balloon extraction. No stent was able to be placed. Patient with post-ERCP pancreatitis which quickly resolved. Recommend General surgery follow-up for elective cholecystectomy but patient will need risk assessment by cardiology and pulmonology services.

## 2021-10-14 NOTE — Assessment & Plan Note (Deleted)
See COPD above.

## 2021-10-14 NOTE — ED Provider Notes (Signed)
[facility] DEPT Provider Note   CSN: [id number] Arrival date & time: 10/14/21  1204     History  Chief Complaint  Patient presents with   Abdominal Pain   abnormal labs    Michael [name] is a 76 y.o. male.  76 year old male with history of CAD (with stents), CKD, COPD (on 4L [place] at baseline), recently treated for H. Pylori, presents at the request of his GI, Dr. Rush Landmark for elevated LFTs and bili on 10/08/21 followed by CT abdomen/pelvis done yesterday with report of chronic abdominal pain (onset 07/2019), although not having any pain at this time. Daughter at bedside, state patient was more jaundice yesterday that today, patient reports his urine is less orange today.  Patient's only complaint is that he does not have appetite and has not eaten since Saturday.      Home Medications Prior to Admission medications   Medication Sig Start Date End Date Taking? Authorizing Provider  atorvastatin (LIPITOR) 20 MG tablet Take 1 tablet (20 mg total) by mouth at bedtime. 09/24/21  Yes Leonie Man, MD  Chlorpheniramine Maleate (ALLERGY PO) Take 2 tablets by mouth daily.   Yes [provider]  clopidogrel (PLAVIX) 75 MG tablet Take 1 tablet (75 mg total) by mouth daily. 07/16/21  Yes Mansouraty, Telford Nab., MD  esomeprazole (NEXIUM) 40 MG capsule Take 1 capsule (40 mg total) by mouth daily. 10/08/21  Yes Mansouraty, Telford Nab., MD  fenofibrate (TRICOR) 48 MG tablet Take 1 tablet (48 mg total) by mouth daily. 08/20/21  Yes Leonie Man, MD  levothyroxine (SYNTHROID) 75 MCG tablet TAKE 1 TABLET BY MOUTH DAILY 09/08/21  Yes Abonza, Maritza, PA-C  metoprolol tartrate (LOPRESSOR) 25 MG tablet Take 1 tablet (25 mg total) by mouth 2 (two) times daily. 07/25/21  Yes Leonie Man, MD  nitroGLYCERIN (NITROSTAT) 0.4 MG SL tablet Place 1 tablet (0.4 mg total) under the tongue every 5 (five) minutes as needed. After 3 doses, if no relief dial 911 03/09/19  Yes  Opalski, Deborah, DO  Vitamin D, Ergocalciferol, (DRISDOL) 1.25 MG (50000 UNIT) CAPS capsule TAKE 1 CAPSULE BY MOUTH EVERY 7 DAYS 07/21/21  Yes Abonza, Maritza, PA-C  OXYGEN Inhale 5 L into the lungs continuous.    [provider]      Allergies    Patient has no known allergies.    Review of Systems   Review of Systems Negative except as per HPI Physical Exam Updated Vital Signs BP (!) 109/92    Pulse 74    Temp (!) 97.5 F (36.4 C) (Oral)    Resp 18    SpO2 96%  Physical Exam Vitals and nursing note reviewed.  Constitutional:      General: He is not in acute distress.    Appearance: He is well-developed. He is not diaphoretic.  HENT:     Head: Normocephalic and atraumatic.  Eyes:     General: Scleral icterus present.  Cardiovascular:     Rate and Rhythm: Normal rate and regular rhythm.     Heart sounds: Normal heart sounds.  Pulmonary:     Effort: Pulmonary effort is normal.     Breath sounds: Normal breath sounds.  Abdominal:     General: Bowel sounds are normal.     Palpations: Abdomen is soft.     Tenderness: There is no abdominal tenderness.  Skin:    Coloration: Skin is jaundiced.  Neurological:     Mental Status: He  is alert and oriented to person, place, and time.  Psychiatric:        Behavior: Behavior normal.    ED Results / Procedures / Treatments   Labs (all labs ordered are listed, but only abnormal results are displayed) Labs Reviewed  CBC WITH DIFFERENTIAL/PLATELET - Abnormal; Notable for the following components:      Result Value   RBC 3.78 (*)    Hemoglobin 12.2 (*)    HCT 37.1 (*)    Platelets 438 (*)    All other components within normal limits  COMPREHENSIVE METABOLIC PANEL - Abnormal; Notable for the following components:   Creatinine, Ser 1.54 (*)    Albumin 3.1 (*)    AST 132 (*)    ALT 144 (*)    Alkaline Phosphatase 442 (*)    Total Bilirubin 4.2 (*)    GFR, Estimated 46 (*)    All other components within normal limits   BILIRUBIN, DIRECT - Abnormal; Notable for the following components:   Bilirubin, Direct 2.1 (*)    All other components within normal limits  RESP PANEL BY RT-PCR (FLU A&B, COVID) ARPGX2  LIPASE, BLOOD  URINALYSIS, ROUTINE W REFLEX MICROSCOPIC    EKG None  Radiology CT ABDOMEN PELVIS W CONTRAST  Result Date: 10/14/2021 CLINICAL DATA:  Abdominal pain, jaundice and abnormal LFTs. EXAM: CT ABDOMEN AND PELVIS WITH CONTRAST TECHNIQUE: Multidetector CT imaging of the abdomen and pelvis was performed using the standard protocol following bolus administration of intravenous contrast. RADIATION DOSE REDUCTION: This exam was performed according to the departmental dose-optimization program which includes automated exposure control, adjustment of the mA and/or kV according to patient size and/or use of iterative reconstruction technique. CONTRAST:  28mL OMNIPAQUE IOHEXOL 300 MG/ML  SOLN COMPARISON:  CTA chest, abdomen and pelvis 08/03/2020 FINDINGS: Lower chest: No acute abnormality. Emphysematous changes with subpleural fibrosis and honeycombing are again noted, with small chronic loculated posterior basal left pleural fluid collection and chronic lower lobe posterior atelectasis greater on the left. The heart is slightly enlarged. There is coronary artery calcification and old CABG. Hepatobiliary: The liver is normal in size with mild steatosis seen. No liver mass is evident. There are small capsular calcifications again noted posteriorly. There is no intrahepatic biliary prominence but is extrahepatic biliary dilatation with the common bile duct 9 mm. There is no visible ductal filling defect. Gallbladder partially contracted but no wall thickening, stones or adjacent edema noted. Pancreas: No ductal dilatation or mass is seen. The pancreas partially fatty atrophic. No adjacent edema. Spleen: No mass or splenomegaly. Adrenals/Urinary Tract: There is no adrenal mass. Congenital malrotation again noted left  kidney. Small 6 mm stable hypodensity in the posterior right kidney is unchanged but too small to characterize. There are no stones or hydronephrosis. Both kidneys slightly small in length, both measuring 8.9 cm length. There is cortical thinning. There is no bladder thickening. Stomach/Bowel: Small hiatal hernia. There is no dilatation or wall thickening including the appendix. Left colonic diverticulosis is seen without evidence of colitis or diverticulitis most advanced diverticular disease in the sigmoid segment. Vascular/Lymphatic: There is heavy aortoiliac calcific plaque. 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 No enlarged lymph nodes are seen. Reproductive: There are calcifications of the prostate but no prostatomegaly. Other: Small umbilical hernia. Small left inguinal fat hernia. No free air, fluid or hemorrhage. Musculoskeletal: There are degenerative changes in the thoracic and lumbar  spine. Advanced degenerative disc change with spondylosis and facet hypertrophy at L5-S1 is associated with severe foraminal stenosis. There is mild hip DJD. IMPRESSION: 1. Increasingly prominent common bile duct without a visible filling defect or pancreatic head mass. MRCP may be helpful for further evaluation. 2. Mild hepatic steatosis.  No mass enhancement. 3. Aortic atherosclerosis, with chronically occluded right common iliac artery. 4. Diverticulosis without evidence of acute diverticulitis. 5. Umbilical and left inguinal fat hernias. 6. Emphysematous and chronic change. 7. Additional findings as described above. Electronically Signed   By: Telford Nab M.D.   On: 10/14/2021 01:19   US Abdomen Limited RUQ (LIVER/GB)  Result Date: 10/14/2021 CLINICAL DATA:  Elevated LFTs EXAM: ULTRASOUND ABDOMEN LIMITED RIGHT UPPER QUADRANT COMPARISON:  CT 10/13/2021 FINDINGS: Gallbladder: Incompletely  distended. No definite stones. Sonographer describes no sonographic Willson Lipa sign. Common bile duct: Diameter: 2.4 mm, unremarkable Liver: No focal lesion identified. Within normal limits in parenchymal echogenicity. No intrahepatic biliary ductal dilatation evident. Portal vein is patent on color Doppler imaging with normal direction of blood flow towards the liver. Other: Technologist describes technically difficult study secondary to bowel gas and body habitus. IMPRESSION: Negative Electronically Signed   By: Lucrezia Europe M.D.   On: 10/14/2021 14:35    Procedures Procedures    Medications Ordered in ED Medications  lactated ringers infusion (has no administration in time range)  acetaminophen (TYLENOL) tablet 650 mg (has no administration in time range)    Or  acetaminophen (TYLENOL) suppository 650 mg (has no administration in time range)  ondansetron (ZOFRAN) tablet 4 mg (has no administration in time range)    Or  ondansetron (ZOFRAN) injection 4 mg (has no administration in time range)  pantoprazole (PROTONIX) EC tablet 40 mg (has no administration in time range)  fenofibrate tablet 54 mg (has no administration in time range)  levothyroxine (SYNTHROID) tablet 75 mcg (has no administration in time range)  metoprolol tartrate (LOPRESSOR) tablet 25 mg (has no administration in time range)  nitroGLYCERIN (NITROSTAT) SL tablet 0.4 mg (has no administration in time range)    ED Course/ Medical Decision Making/ A&P Clinical Course as of 10/14/21 1659  Tue Oct 14, 2021  1449 Discussed with Dr. Lyndel Safe with GI who requests MRCP with contrast, call back with results to plan care. [LM]    Clinical Course User Index [LM] Tacy Learn, PA-C                           Medical Decision Making Amount and/or Complexity of Data Reviewed Labs: ordered. Radiology: ordered.   This patient presents to the ED for concern of elevated LFTs, jaundice, this involves an extensive number of treatment  options, and is a complaint that carries with it a high risk of complications and morbidity.  The differential diagnosis includes but not limited to CBD stone, pancreatic mass, steatosis, hepatitis   Co morbidities that complicate the patient evaluation  COPD on 4 to 5 L nasal cannula at baseline, CAD, MI   Additional history obtained:  Additional history obtained from patient's daughter bedside who states patient is less jaundiced today than yesterday.  Also states urine is less orange today. External records from outside source obtained and reviewed including visit to GI office on February 15 with lab work showing elevated LFTs, alk phos, bilirubin   Lab Tests:  I Ordered, and personally interpreted labs.  The pertinent results include: CBC with normal white blood cell count.  CMP with elevated AST, ALT, alk phos and total bilirubin although these numbers are improved compared to his February 15 labs.  Lipase normal.   Imaging Studies ordered:  Right upper quadrant ultrasound reviewed by radiologist, unremarkable I agree with the radiologist interpretation   Consultations Obtained:  I requested consultation with the Dr. Lyndel Safe with GI,  and discussed lab and imaging findings as well as pertinent plan - they recommend: MRCP.  Contact from Dr. Loletha Carrow with GI who has seen the patient and request hospitalist to admit the patient while GI continues work-up. Consult with [facility] hospitalist service who will consult for admission   Problem List / ED Course:  76 year old male sent to the emergency room by his GI doctor for further work-up of elevated LFTs and bilirubin.  Patient does not have any pain while in the emergency room, states his jaundice has improved compared to yesterday and his urine is also not as orange as it was yesterday. Case discussed with Dr. Lyndel Safe on-call for patient's GI provider who requests MR with contrast.   Reevaluation:  After the interventions noted above, I  reevaluated the patient and found that they have :stayed the same  Dispostion:  After consideration of the diagnostic results and the patients response to treatment, I feel that the patent would benefit from admission to the hospital for further work-up.          Final Clinical Impression(s) / ED Diagnoses Final diagnoses:  Elevated LFTs    Rx / DC Orders ED Discharge Orders     None         Roque Lias 10/14/21 1659    Dorie Rank, MD 10/15/21 [phone]

## 2021-10-14 NOTE — Assessment & Plan Note (Addendum)
CAD with history of CABG and stent HLD PVD History of carotid stenosis: Patient without chest pain.  He is on metoprolol-resuming Plavix and statin.

## 2021-10-14 NOTE — ED Triage Notes (Signed)
Per family-states he was seen by GI yesterday-told to come to ED due to jaundice, poor po intake-abnormal labs-told to come to ED for further eval

## 2021-10-15 ENCOUNTER — Encounter (HOSPITAL_COMMUNITY): Payer: Self-pay | Admitting: Internal Medicine

## 2021-10-15 DIAGNOSIS — K8070 Calculus of gallbladder and bile duct without cholecystitis without obstruction: Secondary | ICD-10-CM | POA: Diagnosis not present

## 2021-10-15 DIAGNOSIS — D631 Anemia in chronic kidney disease: Secondary | ICD-10-CM

## 2021-10-15 DIAGNOSIS — R7989 Other specified abnormal findings of blood chemistry: Secondary | ICD-10-CM | POA: Diagnosis not present

## 2021-10-15 DIAGNOSIS — R1010 Upper abdominal pain, unspecified: Secondary | ICD-10-CM | POA: Diagnosis not present

## 2021-10-15 DIAGNOSIS — Z7902 Long term (current) use of antithrombotics/antiplatelets: Secondary | ICD-10-CM | POA: Diagnosis not present

## 2021-10-15 DIAGNOSIS — E440 Moderate protein-calorie malnutrition: Secondary | ICD-10-CM | POA: Diagnosis present

## 2021-10-15 DIAGNOSIS — N189 Chronic kidney disease, unspecified: Secondary | ICD-10-CM

## 2021-10-15 LAB — COMPREHENSIVE METABOLIC PANEL
ALT: 109 U/L — ABNORMAL HIGH (ref 0–44)
AST: 108 U/L — ABNORMAL HIGH (ref 15–41)
Albumin: 2.5 g/dL — ABNORMAL LOW (ref 3.5–5.0)
Alkaline Phosphatase: 396 U/L — ABNORMAL HIGH (ref 38–126)
Anion gap: 5 (ref 5–15)
BUN: 17 mg/dL (ref 8–23)
CO2: 26 mmol/L (ref 22–32)
Calcium: 8.6 mg/dL — ABNORMAL LOW (ref 8.9–10.3)
Chloride: 105 mmol/L (ref 98–111)
Creatinine, Ser: 1.3 mg/dL — ABNORMAL HIGH (ref 0.61–1.24)
GFR, Estimated: 57 mL/min — ABNORMAL LOW (ref >60)
Glucose, Bld: 87 mg/dL (ref 70–99)
Potassium: 3.7 mmol/L (ref 3.5–5.1)
Sodium: 136 mmol/L (ref 135–145)
Total Bilirubin: 4 mg/dL — ABNORMAL HIGH (ref 0.3–1.2)
Total Protein: 6 g/dL — ABNORMAL LOW (ref 6.5–8.1)

## 2021-10-15 LAB — CBC
HCT: 31.7 % — ABNORMAL LOW (ref 39.0–52.0)
Hemoglobin: 10 g/dL — ABNORMAL LOW (ref 13.0–17.0)
MCH: 32.1 pg (ref 26.0–34.0)
MCHC: 31.5 g/dL (ref 30.0–36.0)
MCV: 101.6 fL — ABNORMAL HIGH (ref 80.0–100.0)
Platelets: 361 10*3/uL (ref 150–400)
RBC: 3.12 MIL/uL — ABNORMAL LOW (ref 4.22–5.81)
RDW: 15.2 % (ref 11.5–15.5)
WBC: 7.5 10*3/uL (ref 4.0–10.5)
nRBC: 0 % (ref 0.0–0.2)

## 2021-10-15 MED ORDER — OXYCODONE HCL 5 MG PO TABS
5.0000 mg | ORAL_TABLET | Freq: Two times a day (BID) | ORAL | Status: DC | PRN
  Administered 2021-10-15: 5 mg via ORAL
  Filled 2021-10-15 (×2): qty 1

## 2021-10-15 MED ORDER — ENOXAPARIN SODIUM 40 MG/0.4ML IJ SOSY
40.0000 mg | PREFILLED_SYRINGE | INTRAMUSCULAR | Status: DC
  Administered 2021-10-16: 40 mg via SUBCUTANEOUS
  Filled 2021-10-15 (×2): qty 0.4

## 2021-10-15 MED ORDER — OXYCODONE HCL 5 MG PO TABS
5.0000 mg | ORAL_TABLET | Freq: Once | ORAL | Status: AC | PRN
  Administered 2021-10-15: 5 mg via ORAL
  Filled 2021-10-15: qty 1

## 2021-10-15 NOTE — Progress Notes (Signed)
°  Transition of Care [facility]) Screening Note   Patient Details  Name: Michael [name] Date of Birth: 1945/11/24   Transition of Care [facility]) CM/SW Contact:    Lennart Pall, LCSW Phone Number: 10/15/2021, 2:14 PM    Transition of Care Department [facility]) has reviewed patient and no TOC needs have been identified at this time. We will continue to monitor patient advancement through interdisciplinary progression rounds. If new patient transition needs arise, please place a TOC consult.

## 2021-10-15 NOTE — Progress Notes (Signed)
PROGRESS NOTE Michael [name]  YOV:[id number] DOB: 06-Feb-1946 DOA: 10/14/2021 PCP: Lorrene Reid, PA-C   Brief Narrative/Hospital Course: 76 year old gentleman with medical history of CAD multiple vessel with CABG, PCI, carotid artery occlusion, hypertension, HLD, PAD, hypertension, COPD/chronic respiratory failure on 4 L nasal cannula, gout followed by Dr. Rush Landmark for abdominal pain, with H. pylori antigen positive treated and eradicated confirmed with EGD on 07/15/2019 has had abnormal LFTs for several months and recently worsened, underwent further work-up with CT scan of the abdomen pelvis and still jaundiced and directed to the ED for admission. In the ED, hemodynamically stable, on room air, labs shows creatinine at 1.5 alk phos 442 AST 132 ALT 144 total bili 4.2-Labs on 215 over alk phos 426, AST 180 ALT 197 and TB 7.1 GGT 330. GI was consulted in the ED planning for MRCP observation admission and repeat labs in a.m. to see if he needs ERCP or not. MRCP came back with cholelithiasis and choledocholithiasis.    Subjective: Seen and examined this morning.  Patient is resting comfortably feels little better with IV hydration overnight. Reports he has not eaten and would like to eat some food today.  Assessment and Plan: * Pain in the abdomen- (present on admission) Mid upper abdominal pain-with postprandial worsening Abnormal LFTs Nausea with rare vomiting Weight loss presumably due to GI issues: MRI shows cholelithiasis and choledocholithiasis, GI planning for ERCP after Plavix washout 4 to 5 days, continue pain control, IV fluids supportive care, continue to hold Plavix.  Cholelithiasis with choledocholithiasis See above: pain in abdomen #1  Abnormal LFTs- (present on admission) See #1 pain in abdomen  Chronic respiratory failure with hypoxia (HCC) - SaO2 drops to 87% with ambulation- (present on admission) See COPD above  Chronic obstructive pulmonary disease ([place])- (present on  admission) COPD Chronic hypoxic respiratory failure on 4 L: Not in exacerbation continue home inhalers, home oxygen  CAD, multiple vessel - status post CABG: LIMA-LAD, SVG-RPDA; PCI native RCA for 100% SVG-RCA- (present on admission) CAD with history of CABG and stent HLD PVD History of carotid stenosis: Stable no chest pain.  Continue metoprolol, holding Plavix for possible ERCP holding statin due to abnormal LFTs  Essential hypertension- (present on admission) BP stable on home metoprolol  Hyperlipidemia with target LDL less than 70- (present on admission) See CAD above  Peripheral arterial occlusive disease ([place])- (present on admission) See CAD above  Anemia due to chronic kidney disease Hemoglobin is stable in 10 to 12 g range.  Monitor   Stage 3a chronic kidney disease (CKD) (HCC) Baseline creatinine 1.3-1.5 stable.  Creatinine 1.3 with gentle hydration.   Recent Labs  Lab 10/14/21 1407 10/15/21 0444  BUN 21 17  CREATININE 1.54* 1.30*    Hypothyroidism- (present on admission) Continue Synthroid   DVT prophylaxis: SCDs Start: 10/14/21 1628 add Lovenox Code Status:   Code Status: Full Code Family Communication: plan of care discussed with patient/ at bedside.  Disposition: Currently not medically stable for discharge. Status is: Observation The patient will require care spanning > 2 midnights and should be moved to inpatient because: Due to ongoing management of choledocholithiasis ivf, and need for ERCP   Objective: Vitals last 24 hrs: Vitals:   10/14/21 2127 10/14/21 2128 10/15/21 0147 10/15/21 0948  BP: 110/70 (!) 110/59 123/61 (!) 143/65  Pulse: 80 80 69 73  Resp:  _0 Temp:  97.8 F (36.6 C) 98 F (36.7 C) 98 F (36.7 C)  TempSrc:  Oral  Oral Oral  SpO2:  100% 100% 100%  Weight:      Height:       Weight change:   Physical Examination: General exam: AAOX3,older than stated age, weak appearing. HEENT:Oral mucosa moist, Ear/Nose WNL  grossly, dentition normal. Respiratory system: bilaterally diminished BS, no use of accessory muscle Cardiovascular system: S1 & S2 +, No JVD,. Gastrointestinal system: Abdomen soft,NT,ND, BS+ Nervous System:Alert, awake, moving extremities and grossly nonfocal Extremities: LE edema none,distal peripheral pulses palpable.  Skin: No rashes,no icterus. MSK: Normal muscle bulk,tone, power  Medications reviewed:  Scheduled Meds:  fenofibrate  54 mg Oral Daily   levothyroxine  75 mcg Oral Q0600   metoprolol tartrate  25 mg Oral BID   pantoprazole  40 mg Oral Daily   Continuous Infusions:  lactated ringers 75 mL/hr at 10/15/21 0551      Diet Order             Diet regular Room service appropriate? Yes; Fluid consistency: Thin  Diet effective now                            Intake/Output Summary (Last 24 hours) at 10/15/2021 1102 Last data filed at 10/15/2021 1000 Gross per 24 hour  Intake 1260 ml  Output 900 ml  Net 360 ml   Net IO Since Admission: 360 mL [10/15/21 1102]  Wt Readings from Last 3 Encounters:  10/14/21 85.7 kg  07/14/21 90.5 kg  06/17/21 90.5 kg     Unresulted Labs (From admission, onward)    None     Data Reviewed: I have personally reviewed following labs and imaging studies CBC: Recent Labs  Lab 10/14/21 1407 10/15/21 0444  WBC 7.8 7.5  NEUTROABS 5.6  --   HGB 12.2* 10.0*  HCT 37.1* 31.7*  MCV 98.1 101.6*  PLT 438* 233   Basic Metabolic Panel: Recent Labs  Lab 10/14/21 1407 10/15/21 0444  NA 136 136  K 4.2 3.7  CL 102 105  CO2 26 26  GLUCOSE 90 87  BUN 21 17  CREATININE 1.54* 1.30*  CALCIUM 9.2 8.6*   GFR: Estimated Creatinine Clearance: 49.7 mL/min (A) (by C-G formula based on SCr of 1.3 mg/dL (H)). Liver Function Tests: Recent Labs  Lab 10/14/21 1407 10/15/21 0444  AST 132* 108*  ALT 144* 109*  ALKPHOS 442* 396*  BILITOT 4.2* 4.0*  PROT 7.6 6.0*  ALBUMIN 3.1* 2.5*   Recent Labs  Lab 10/14/21 1407  LIPASE  39   No results for input(s): AMMONIA in the last 168 hours. Coagulation Profile: No results for input(s): INR, PROTIME in the last 168 hours. Cardiac Enzymes: No results for input(s): CKTOTAL, CKMB, CKMBINDEX, TROPONINI in the last 168 hours. BNP (last 3 results) No results for input(s): PROBNP in the last 8760 hours. HbA1C: No results for input(s): HGBA1C in the last 72 hours. CBG: No results for input(s): GLUCAP in the last 168 hours. Lipid Profile: No results for input(s): CHOL, HDL, LDLCALC, TRIG, CHOLHDL, LDLDIRECT in the last 72 hours. Thyroid Function Tests: No results for input(s): TSH, T4TOTAL, FREET4, T3FREE, THYROIDAB in the last 72 hours. Anemia Panel: No results for input(s): VITAMINB12, FOLATE, FERRITIN, TIBC, IRON, RETICCTPCT in the last 72 hours. Sepsis Labs: No results for input(s): PROCALCITON, LATICACIDVEN in the last 168 hours.  Recent Results (from the past 435 hour(s))  Helicobacter pylori special antigen     Status: None   Collection Time: 10/13/21 11:52  AM   Specimen: Blood  Result Value Ref Range Status   MICRO NUMBER: [id number]  Final   SPECIMEN QUALITY Adequate  Final   SOURCE: STOOL  Final   STATUS: FINAL  Final   RESULT:   Final    Not Detected  Antimicrobials, proton pump inhibitors, and bismuth preparations inhibit H. pylori and ingestion up to two weeks prior to testing may cause false negative results. If clinically indicated the test should be repeated on a new specimen  obtained two weeks after discontinuing treatment.     Comment: Reference Range:Not Detected  Resp Panel by RT-PCR (Flu A&B, Covid) Nasopharyngeal Swab     Status: None   Collection Time: 10/14/21  4:17 PM   Specimen: Nasopharyngeal Swab; Nasopharyngeal(NP) swabs in vial transport medium  Result Value Ref Range Status   SARS Coronavirus 2 by RT PCR NEGATIVE NEGATIVE Final    Comment: (NOTE) SARS-CoV-2 target nucleic acids are NOT DETECTED.  The SARS-CoV-2 RNA is generally  detectable in upper respiratory specimens during the acute phase of infection. The lowest concentration of SARS-CoV-2 viral copies this assay can detect is 138 copies/mL. A negative result does not preclude SARS-Cov-2 infection and should not be used as the sole basis for treatment or other patient management decisions. A negative result may occur with  improper specimen collection/handling, submission of specimen other than nasopharyngeal swab, presence of viral mutation(s) within the areas targeted by this assay, and inadequate number of viral copies(<138 copies/mL). A negative result must be combined with clinical observations, patient history, and epidemiological information. The expected result is Negative.  Fact Sheet for Patients:  EntrepreneurPulse.com.au  Fact Sheet for Healthcare Providers:  IncredibleEmployment.be  This test is no t yet approved or cleared by the [place] FDA and  has been authorized for detection and/or diagnosis of SARS-CoV-2 by FDA under an Emergency Use Authorization (EUA). This EUA will remain  in effect (meaning this test can be used) for the duration of the COVID-19 declaration under Section 564(b)(1) of the Act, 21 U.S.C.section 360bbb-3(b)(1), unless the authorization is terminated  or revoked sooner.       Influenza A by PCR NEGATIVE NEGATIVE Final   Influenza B by PCR NEGATIVE NEGATIVE Final    Comment: (NOTE) The Xpert Xpress SARS-CoV-2/FLU/RSV plus assay is intended as an aid in the diagnosis of influenza from Nasopharyngeal swab specimens and should not be used as a sole basis for treatment. Nasal washings and aspirates are unacceptable for Xpert Xpress SARS-CoV-2/FLU/RSV testing.  Fact Sheet for Patients: EntrepreneurPulse.com.au  Fact Sheet for Healthcare Providers: IncredibleEmployment.be  This test is not yet approved or cleared by the [place] FDA  and has been authorized for detection and/or diagnosis of SARS-CoV-2 by FDA under an Emergency Use Authorization (EUA). This EUA will remain in effect (meaning this test can be used) for the duration of the COVID-19 declaration under Section 564(b)(1) of the Act, 21 U.S.C. section 360bbb-3(b)(1), unless the authorization is terminated or revoked.  Performed at [facility], [place] [address], [place], [place] [address]     Antimicrobials: Anti-infectives (From admission, onward)    None      Culture/Microbiology No results found for: SDES, SPECREQUEST, CULT, REPTSTATUS  Other culture-see note   Radiology Studies: MR 3D Recon At Scanner  Result Date: 10/15/2021 CLINICAL DATA:  76 year old male with history of elevated liver function tests. Jaundice. EXAM: MRI ABDOMEN WITH CONTRAST (WITH MRCP) TECHNIQUE: Multiplanar multisequence MR imaging of the abdomen was performed  following the administration of intravenous contrast. Heavily T2-weighted images of the biliary and pancreatic ducts were obtained, and three-dimensional MRCP images were rendered by post processing. CONTRAST:  9m GADAVIST GADOBUTROL 1 MMOL/ML IV SOLN COMPARISON:  No prior abdominal MRI. Abdominal ultrasound 10/14/2021. CT the abdomen and pelvis 10/13/2021. FINDINGS: Comment: Portions of today's examination are limited by considerable patient respiratory motion. Lower chest: Signal in the dependent portion of the left lower lobe, likely reflective of subsegmental atelectasis, adjacent to a trace left pleural effusion, but poorly evaluated on today's magnetic resonance examination. Signal void in the lower sternum, presumably from median sternotomy wires. Hepatobiliary: No suspicious cystic or solid hepatic lesions. MRCP images demonstrates some very mild intrahepatic biliary ductal dilatation. Common bile duct is dilated up to 11 mm in the porta hepatis. Gallbladder is nearly completely contracted. Small  filling defects are evident in the gallbladder, indicative of tiny gallstones. No pericholecystic fluid. Gallbladder wall does not appear thickened. In the distal common bile duct there are some small filling defects concerning for choledocholithiasis. The largest of these measures up to 7 mm in diameter immediately before the level of the ampulla. Pancreas: There is some subtle focal enhancement at the level of the ampulla (axial image 64 of series 22), which may be related to indwelling stones. This is not mass-like in appearance, and measures only approximately 4 mm. No other potential pancreatic mass. No pancreatic ductal dilatation noted on MRCP images. No peripancreatic fluid collections or inflammatory changes. Spleen:  Unremarkable. Adrenals/Urinary Tract: Small T1 hypointense, T2 hyperintense, nonenhancing lesions in both kidneys, compatible with tiny simple cysts. No hydroureteronephrosis in the visualized portions of the abdomen. Bilateral adrenal glands are normal in appearance. Stomach/Bowel: Unremarkable. Vascular/Lymphatic: Aortic atherosclerosis. No aneurysm identified in the visualized abdominal vasculature. No lymphadenopathy noted in the abdomen. Other: No significant volume of ascites noted in the visualized portions of the peritoneal cavity. Musculoskeletal: No aggressive appearing osseous lesions are noted in the visualized portions of the skeleton. IMPRESSION: 1. Study is positive for cholelithiasis in addition to choledocholithiasis. There are several small stones in the distal common bile duct extending to the level of the ampulla. Additionally, at the level of the ampulla there is some very subtle focal enhancement. The possibility of an ampullary mass is not excluded, but not strongly favored (this enhancement may simply be inflammatory in the setting of indwelling stones impacted in the distal common bile duct). At this time, this is associated with only mild proximal intra and  extrahepatic biliary ductal dilatation. Further evaluation with ERCP and endoscopic ultrasound should be considered if clinically appropriate. 2. Trace left pleural effusion with probable subsegmental atelectasis in the left lower lobe. 3. Aortic atherosclerosis. Electronically Signed   By: DVinnie LangtonM.D.   On: 10/15/2021 05:20   MR ABDOMEN MRCP W WO CONTAST  Result Date: 10/15/2021 CLINICAL DATA:  76year old male with history of elevated liver function tests. Jaundice. EXAM: MRI ABDOMEN WITH CONTRAST (WITH MRCP) TECHNIQUE: Multiplanar multisequence MR imaging of the abdomen was performed following the administration of intravenous contrast. Heavily T2-weighted images of the biliary and pancreatic ducts were obtained, and three-dimensional MRCP images were rendered by post processing. CONTRAST:  924mGADAVIST GADOBUTROL 1 MMOL/ML IV SOLN COMPARISON:  No prior abdominal MRI. Abdominal ultrasound 10/14/2021. CT the abdomen and pelvis 10/13/2021. FINDINGS: Comment: Portions of today's examination are limited by considerable patient respiratory motion. Lower chest: Signal in the dependent portion of the left lower lobe, likely reflective of subsegmental atelectasis, adjacent to  a trace left pleural effusion, but poorly evaluated on today's magnetic resonance examination. Signal void in the lower sternum, presumably from median sternotomy wires. Hepatobiliary: No suspicious cystic or solid hepatic lesions. MRCP images demonstrates some very mild intrahepatic biliary ductal dilatation. Common bile duct is dilated up to 11 mm in the porta hepatis. Gallbladder is nearly completely contracted. Small filling defects are evident in the gallbladder, indicative of tiny gallstones. No pericholecystic fluid. Gallbladder wall does not appear thickened. In the distal common bile duct there are some small filling defects concerning for choledocholithiasis. The largest of these measures up to 7 mm in diameter immediately  before the level of the ampulla. Pancreas: There is some subtle focal enhancement at the level of the ampulla (axial image 64 of series 22), which may be related to indwelling stones. This is not mass-like in appearance, and measures only approximately 4 mm. No other potential pancreatic mass. No pancreatic ductal dilatation noted on MRCP images. No peripancreatic fluid collections or inflammatory changes. Spleen:  Unremarkable. Adrenals/Urinary Tract: Small T1 hypointense, T2 hyperintense, nonenhancing lesions in both kidneys, compatible with tiny simple cysts. No hydroureteronephrosis in the visualized portions of the abdomen. Bilateral adrenal glands are normal in appearance. Stomach/Bowel: Unremarkable. Vascular/Lymphatic: Aortic atherosclerosis. No aneurysm identified in the visualized abdominal vasculature. No lymphadenopathy noted in the abdomen. Other: No significant volume of ascites noted in the visualized portions of the peritoneal cavity. Musculoskeletal: No aggressive appearing osseous lesions are noted in the visualized portions of the skeleton. IMPRESSION: 1. Study is positive for cholelithiasis in addition to choledocholithiasis. There are several small stones in the distal common bile duct extending to the level of the ampulla. Additionally, at the level of the ampulla there is some very subtle focal enhancement. The possibility of an ampullary mass is not excluded, but not strongly favored (this enhancement may simply be inflammatory in the setting of indwelling stones impacted in the distal common bile duct). At this time, this is associated with only mild proximal intra and extrahepatic biliary ductal dilatation. Further evaluation with ERCP and endoscopic ultrasound should be considered if clinically appropriate. 2. Trace left pleural effusion with probable subsegmental atelectasis in the left lower lobe. 3. Aortic atherosclerosis. Electronically Signed   By: Vinnie Langton M.D.   On:  10/15/2021 05:20   US Abdomen Limited RUQ (LIVER/GB)  Result Date: 10/14/2021 CLINICAL DATA:  Elevated LFTs EXAM: ULTRASOUND ABDOMEN LIMITED RIGHT UPPER QUADRANT COMPARISON:  CT 10/13/2021 FINDINGS: Gallbladder: Incompletely distended. No definite stones. Sonographer describes no sonographic Murphy sign. Common bile duct: Diameter: 2.4 mm, unremarkable Liver: No focal lesion identified. Within normal limits in parenchymal echogenicity. No intrahepatic biliary ductal dilatation evident. Portal vein is patent on color Doppler imaging with normal direction of blood flow towards the liver. Other: Technologist describes technically difficult study secondary to bowel gas and body habitus. IMPRESSION: Negative Electronically Signed   By: Lucrezia Europe M.D.   On: 10/14/2021 14:35     LOS: 0 days   Antonieta Pert, MD [facility]  10/15/2021, 11:02 AM

## 2021-10-15 NOTE — Assessment & Plan Note (Deleted)
See above: pain in abdomen #1

## 2021-10-15 NOTE — Progress Notes (Signed)
Initial Nutrition Assessment  DOCUMENTATION CODES:   Non-severe (moderate) malnutrition in context of acute illness/injury  INTERVENTION:   -Encouraged PO intakes  -Will monitor for needs during admission  NUTRITION DIAGNOSIS:   Moderate Malnutrition related to acute illness (cholelithiasis) as evidenced by mild fat depletion, mild muscle depletion.  GOAL:   Patient will meet greater than or equal to 90% of their needs  MONITOR:   PO intake, Labs, Weight trends, I & O's  REASON FOR ASSESSMENT:   Consult Assessment of nutrition requirement/status  ASSESSMENT:   76 year old gentleman with medical history of CAD multiple vessel with CABG, PCI, carotid artery occlusion, hypertension, HLD, PAD, hypertension, COPD/chronic respiratory failure on 4 L nasal cannula, gout followed by Dr. Meridee Score for abdominal pain, with H. pylori antigen positive treated and eradicated confirmed with EGD on 07/15/2019 has had abnormal LFTs for several months and recently worsened.  Patient in room, daughter at bedside.  Upon arrival to room, pt made it very clear he is not interested in diet education and he plans to eat what he wants given his age. States PTA he was eating everything and anything he wanted, especially if it was fried and greasy. Pt was able to eat today which he was happy about. He ate some eggs and bacon for breakfast and a cheeseburger for lunch. States he feels very full now.  Per GI note, plan is for ERCP once off Plavix for 4-5 days.   Per pt, he weighed around 200 lbs in September/October 2022. States he lost 10 lbs then d/t similar condition. Current weight 188 lbs (this is a 5% wt loss x 4 months which is insignificant for time frame).   Medications: Lactated ringers  Labs reviewed.  NUTRITION - FOCUSED PHYSICAL EXAM:  Flowsheet Row Most Recent Value  Orbital Region Mild depletion  Upper Arm Region Mild depletion  Thoracic and Lumbar Region Unable to assess  Buccal  Region Moderate depletion  Temple Region Moderate depletion  Clavicle Bone Region Mild depletion  Clavicle and Acromion Bone Region Mild depletion  Scapular Bone Region Mild depletion  Dorsal Hand Moderate depletion  Patellar Region Mild depletion  Anterior Thigh Region Mild depletion  Posterior Calf Region Mild depletion  Edema (RD Assessment) None  Hair Reviewed  Eyes Reviewed  [jaundice]  Mouth Reviewed  [uses dentures]  Skin Reviewed  [jaundice]       Diet Order:   Diet Order             Diet regular Room service appropriate? Yes; Fluid consistency: Thin  Diet effective now                   EDUCATION NEEDS:   Education needs have been addressed  Skin:  Skin Assessment: Reviewed RN Assessment  Last BM:  2/20  Height:   Ht Readings from Last 1 Encounters:  10/14/21 5\' 7"  (1.702 m)    Weight:   Wt Readings from Last 1 Encounters:  10/14/21 85.7 kg    BMI:  Body mass index is 29.59 kg/m.  Estimated Nutritional Needs:   Kcal:  1700-1900  Protein:  75-85g  Fluid:  1.9L/day  10/16/21, MS, RD, LDN Inpatient Clinical Dietitian Contact information available via Amion

## 2021-10-15 NOTE — Progress Notes (Signed)
Patient ID: Michael [name], male   DOB: [birth year], 76 y.o.   MRN: [id number]    Progress Note   Subjective   Day # 2  CC;Jaundice, intermittent abdominal pain  Plavix on hold-confirmed with patient last dose of Plavix yesterday On chronic O2 4 L nasal cannula  Up in chair, good spirits, daughter at bedside.  No current complaints of abdominal discomfort or nausea, says he is hungry and would like something fattening.  MRI/MRCP-subsegmental atelectasis/trace left pleural effusion, mild intrahepatic biliary ductal dilation with CBD 11 mm at the porta hepatis, gallbladder contracted, gallstones, no pericholecystic fluid Small filling defects in the distal CBD concerning for choledocholithiasis largest 7 mm, subtle focal enhancement at the level of the ampulla  T. bili 4.0/alk phos 396/AST 108/ALT 109-bili improved down from 7.1 on 10/08/2021 WBC 7.5/hemoglobin 10/hematocrit 31.7   Objective   Vital signs in last 24 hours: Temp:  [97.5 F (36.4 C)-98 F (36.7 C)] 98 F (36.7 C) (02/22 0147) Pulse Rate:  [69-80] 69 (02/22 0147) Resp:  [15-18] 15 (02/22 0147) BP: (98-127)/(59-92) 123/61 (02/22 0147) SpO2:  [93 %-100 %] 100 % (02/22 0147) Weight:  [85.7 kg] 85.7 kg (02/21 1737) Last BM Date : 10/13/21 General:    Elderly white male in NAD-on O2 4 L Heart:  Regular rate and rhythm; no murmurs Lungs: Decreased breath sounds bilaterally Abdomen:  Soft, nontender and nondistended. Normal bowel sounds. Extremities:  Without edema. Neurologic:  Alert and oriented,  grossly normal neurologically. Psych:  Cooperative. Normal mood and affect.  Intake/Output from previous day: 02/21 0701 - 02/22 0700 In: 1140 [P.O.:240; I.V.:900] Out: 900 [Urine:900] Intake/Output this shift: No intake/output data recorded.  Lab Results: Recent Labs    10/14/21 1407 10/15/21 0444  WBC 7.8 7.5  HGB 12.2* 10.0*  HCT 37.1* 31.7*  PLT 438* 361   BMET Recent Labs    10/14/21 1407 10/15/21 0444  NA  136 136  K 4.2 3.7  CL 102 105  CO2 26 26  GLUCOSE 90 87  BUN 21 17  CREATININE 1.54* 1.30*  CALCIUM 9.2 8.6*   LFT Recent Labs    10/14/21 1407 10/15/21 0444  PROT 7.6 6.0*  ALBUMIN 3.1* 2.5*  AST 132* 108*  ALT 144* 109*  ALKPHOS 442* 396*  BILITOT 4.2* 4.0*  BILIDIR 2.1*  --    PT/INR No results for input(s): LABPROT, INR in the last 72 hours.  Studies/Results: CT ABDOMEN PELVIS W CONTRAST  Result Date: 10/14/2021 CLINICAL DATA:  Abdominal pain, jaundice and abnormal LFTs. EXAM: CT ABDOMEN AND PELVIS WITH CONTRAST TECHNIQUE: Multidetector CT imaging of the abdomen and pelvis was performed using the standard protocol following bolus administration of intravenous contrast. RADIATION DOSE REDUCTION: This exam was performed according to the departmental dose-optimization program which includes automated exposure control, adjustment of the mA and/or kV according to patient size and/or use of iterative reconstruction technique. CONTRAST:  51m OMNIPAQUE IOHEXOL 300 MG/ML  SOLN COMPARISON:  CTA chest, abdomen and pelvis 08/03/2020 FINDINGS: Lower chest: No acute abnormality. Emphysematous changes with subpleural fibrosis and honeycombing are again noted, with small chronic loculated posterior basal left pleural fluid collection and chronic lower lobe posterior atelectasis greater on the left. The heart is slightly enlarged. There is coronary artery calcification and old CABG. Hepatobiliary: The liver is normal in size with mild steatosis seen. No liver mass is evident. There are small capsular calcifications again noted posteriorly. There is no intrahepatic biliary prominence but is extrahepatic biliary dilatation with  the common bile duct 9 mm. There is no visible ductal filling defect. Gallbladder partially contracted but no wall thickening, stones or adjacent edema noted. Pancreas: No ductal dilatation or mass is seen. The pancreas partially fatty atrophic. No adjacent edema. Spleen: No  mass or splenomegaly. Adrenals/Urinary Tract: There is no adrenal mass. Congenital malrotation again noted left kidney. Small 6 mm stable hypodensity in the posterior right kidney is unchanged but too small to characterize. There are no stones or hydronephrosis. Both kidneys slightly small in length, both measuring 8.9 cm length. There is cortical thinning. There is no bladder thickening. Stomach/Bowel: Small hiatal hernia. There is no dilatation or wall thickening including the appendix. Left colonic diverticulosis is seen without evidence of colitis or diverticulitis most advanced diverticular disease in the sigmoid segment. Vascular/Lymphatic: There is heavy aortoiliac calcific plaque. 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 No enlarged lymph nodes are seen. Reproductive: There are calcifications of the prostate but no prostatomegaly. Other: Small umbilical hernia. Small left inguinal fat hernia. No free air, fluid or hemorrhage. Musculoskeletal: There are degenerative changes in the thoracic and lumbar spine. Advanced degenerative disc change with spondylosis and facet hypertrophy at L5-S1 is associated with severe foraminal stenosis. There is mild hip DJD. IMPRESSION: 1. Increasingly prominent common bile duct without a visible filling defect or pancreatic head mass. MRCP may be helpful for further evaluation. 2. Mild hepatic steatosis.  No mass enhancement. 3. Aortic atherosclerosis, with chronically occluded right common iliac artery. 4. Diverticulosis without evidence of acute diverticulitis. 5. Umbilical and left inguinal fat hernias. 6. Emphysematous and chronic change. 7. Additional findings as described above. Electronically Signed   By: Telford Nab M.D.   On: 10/14/2021 01:19   MR 3D Recon At Scanner  Result Date: 10/15/2021 CLINICAL DATA:  76 year old male with history of  elevated liver function tests. Jaundice. EXAM: MRI ABDOMEN WITH CONTRAST (WITH MRCP) TECHNIQUE: Multiplanar multisequence MR imaging of the abdomen was performed following the administration of intravenous contrast. Heavily T2-weighted images of the biliary and pancreatic ducts were obtained, and three-dimensional MRCP images were rendered by post processing. CONTRAST:  5mL GADAVIST GADOBUTROL 1 MMOL/ML IV SOLN COMPARISON:  No prior abdominal MRI. Abdominal ultrasound 10/14/2021. CT the abdomen and pelvis 10/13/2021. FINDINGS: Comment: Portions of today's examination are limited by considerable patient respiratory motion. Lower chest: Signal in the dependent portion of the left lower lobe, likely reflective of subsegmental atelectasis, adjacent to a trace left pleural effusion, but poorly evaluated on today's magnetic resonance examination. Signal void in the lower sternum, presumably from median sternotomy wires. Hepatobiliary: No suspicious cystic or solid hepatic lesions. MRCP images demonstrates some very mild intrahepatic biliary ductal dilatation. Common bile duct is dilated up to 11 mm in the porta hepatis. Gallbladder is nearly completely contracted. Small filling defects are evident in the gallbladder, indicative of tiny gallstones. No pericholecystic fluid. Gallbladder wall does not appear thickened. In the distal common bile duct there are some small filling defects concerning for choledocholithiasis. The largest of these measures up to 7 mm in diameter immediately before the level of the ampulla. Pancreas: There is some subtle focal enhancement at the level of the ampulla (axial image 64 of series 22), which may be related to indwelling stones. This is not mass-like in appearance, and measures only approximately 4 mm. No other potential pancreatic mass. No pancreatic ductal dilatation noted on MRCP images. No peripancreatic  fluid collections or inflammatory changes. Spleen:  Unremarkable.  Adrenals/Urinary Tract: Small T1 hypointense, T2 hyperintense, nonenhancing lesions in both kidneys, compatible with tiny simple cysts. No hydroureteronephrosis in the visualized portions of the abdomen. Bilateral adrenal glands are normal in appearance. Stomach/Bowel: Unremarkable. Vascular/Lymphatic: Aortic atherosclerosis. No aneurysm identified in the visualized abdominal vasculature. No lymphadenopathy noted in the abdomen. Other: No significant volume of ascites noted in the visualized portions of the peritoneal cavity. Musculoskeletal: No aggressive appearing osseous lesions are noted in the visualized portions of the skeleton. IMPRESSION: 1. Study is positive for cholelithiasis in addition to choledocholithiasis. There are several small stones in the distal common bile duct extending to the level of the ampulla. Additionally, at the level of the ampulla there is some very subtle focal enhancement. The possibility of an ampullary mass is not excluded, but not strongly favored (this enhancement may simply be inflammatory in the setting of indwelling stones impacted in the distal common bile duct). At this time, this is associated with only mild proximal intra and extrahepatic biliary ductal dilatation. Further evaluation with ERCP and endoscopic ultrasound should be considered if clinically appropriate. 2. Trace left pleural effusion with probable subsegmental atelectasis in the left lower lobe. 3. Aortic atherosclerosis. Electronically Signed   By: Vinnie Langton M.D.   On: 10/15/2021 05:20   MR ABDOMEN MRCP W WO CONTAST  Result Date: 10/15/2021 CLINICAL DATA:  76 year old male with history of elevated liver function tests. Jaundice. EXAM: MRI ABDOMEN WITH CONTRAST (WITH MRCP) TECHNIQUE: Multiplanar multisequence MR imaging of the abdomen was performed following the administration of intravenous contrast. Heavily T2-weighted images of the biliary and pancreatic ducts were obtained, and three-dimensional  MRCP images were rendered by post processing. CONTRAST:  31mL GADAVIST GADOBUTROL 1 MMOL/ML IV SOLN COMPARISON:  No prior abdominal MRI. Abdominal ultrasound 10/14/2021. CT the abdomen and pelvis 10/13/2021. FINDINGS: Comment: Portions of today's examination are limited by considerable patient respiratory motion. Lower chest: Signal in the dependent portion of the left lower lobe, likely reflective of subsegmental atelectasis, adjacent to a trace left pleural effusion, but poorly evaluated on today's magnetic resonance examination. Signal void in the lower sternum, presumably from median sternotomy wires. Hepatobiliary: No suspicious cystic or solid hepatic lesions. MRCP images demonstrates some very mild intrahepatic biliary ductal dilatation. Common bile duct is dilated up to 11 mm in the porta hepatis. Gallbladder is nearly completely contracted. Small filling defects are evident in the gallbladder, indicative of tiny gallstones. No pericholecystic fluid. Gallbladder wall does not appear thickened. In the distal common bile duct there are some small filling defects concerning for choledocholithiasis. The largest of these measures up to 7 mm in diameter immediately before the level of the ampulla. Pancreas: There is some subtle focal enhancement at the level of the ampulla (axial image 64 of series 22), which may be related to indwelling stones. This is not mass-like in appearance, and measures only approximately 4 mm. No other potential pancreatic mass. No pancreatic ductal dilatation noted on MRCP images. No peripancreatic fluid collections or inflammatory changes. Spleen:  Unremarkable. Adrenals/Urinary Tract: Small T1 hypointense, T2 hyperintense, nonenhancing lesions in both kidneys, compatible with tiny simple cysts. No hydroureteronephrosis in the visualized portions of the abdomen. Bilateral adrenal glands are normal in appearance. Stomach/Bowel: Unremarkable. Vascular/Lymphatic: Aortic atherosclerosis. No  aneurysm identified in the visualized abdominal vasculature. No lymphadenopathy noted in the abdomen. Other: No significant volume of ascites noted in the visualized portions of the peritoneal cavity. Musculoskeletal: No aggressive appearing osseous lesions  are noted in the visualized portions of the skeleton. IMPRESSION: 1. Study is positive for cholelithiasis in addition to choledocholithiasis. There are several small stones in the distal common bile duct extending to the level of the ampulla. Additionally, at the level of the ampulla there is some very subtle focal enhancement. The possibility of an ampullary mass is not excluded, but not strongly favored (this enhancement may simply be inflammatory in the setting of indwelling stones impacted in the distal common bile duct). At this time, this is associated with only mild proximal intra and extrahepatic biliary ductal dilatation. Further evaluation with ERCP and endoscopic ultrasound should be considered if clinically appropriate. 2. Trace left pleural effusion with probable subsegmental atelectasis in the left lower lobe. 3. Aortic atherosclerosis. Electronically Signed   By: Vinnie Langton M.D.   On: 10/15/2021 05:20   US Abdomen Limited RUQ (LIVER/GB)  Result Date: 10/14/2021 CLINICAL DATA:  Elevated LFTs EXAM: ULTRASOUND ABDOMEN LIMITED RIGHT UPPER QUADRANT COMPARISON:  CT 10/13/2021 FINDINGS: Gallbladder: Incompletely distended. No definite stones. Sonographer describes no sonographic Murphy sign. Common bile duct: Diameter: 2.4 mm, unremarkable Liver: No focal lesion identified. Within normal limits in parenchymal echogenicity. No intrahepatic biliary ductal dilatation evident. Portal vein is patent on color Doppler imaging with normal direction of blood flow towards the liver. Other: Technologist describes technically difficult study secondary to bowel gas and body habitus. IMPRESSION: Negative Electronically Signed   By: Lucrezia Europe M.D.   On:  10/14/2021 14:35       Assessment / Plan:    #61 76 year old white male with complaints of intermittent episodes of epigastric pain, and poor appetite over the past year.  Episodes are random, some lasting multiple hours and some lasting for few hours.  Associated weight loss No discomfort over the past 3 to 4 days  Admitted yesterday after abnormal outpatient CT, and persistent jaundice  MRI/MRCP last evening confirms cholelithiasis, no definite gallbladder wall thickening, mildly dilated CBD and choledocholithiasis, there are some subtle enhancement at the ampulla question secondary to inflammation.  Labs are stable-T. 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  Chronic kidney disease  Plan; keep Plavix on hold-we will need 4 to 5-day washout Have ordered regular diet Discussed MRI/MRCP findings in detail with the patient and his daughter, and indication for ERCP with sphincterotomy and stone extraction.  At this point we feel patient will be best served by keeping him in the hospital so that procedure can be coordinated in the most timely fashion Patient is high risk for complications with anesthesia given his severe COPD, this was discussed again today with the patient and daughter and they voiced understanding. Follow LFTs     Principal Problem:   Pain in the abdomen Active Problems:   Chronic obstructive pulmonary disease (HCC)   CAD, multiple vessel - status post CABG: LIMA-LAD, SVG-RPDA; PCI native RCA for 100% SVG-RCA   Essential hypertension   Hypothyroidism   Peripheral arterial occlusive disease (HCC)   Hyperlipidemia with target LDL less than 70   Chronic respiratory failure with hypoxia (HCC) - SaO2 drops to 87% with  ambulation   Abnormal LFTs   Stage 3a chronic kidney disease (CKD) ([place])     LOS: 0 days   Roshun Klingensmith  PA-C2/22/2023, 8:38 AM

## 2021-10-15 NOTE — Assessment & Plan Note (Addendum)
Stable

## 2021-10-16 DIAGNOSIS — Z955 Presence of coronary angioplasty implant and graft: Secondary | ICD-10-CM | POA: Diagnosis not present

## 2021-10-16 DIAGNOSIS — I252 Old myocardial infarction: Secondary | ICD-10-CM | POA: Diagnosis not present

## 2021-10-16 DIAGNOSIS — N179 Acute kidney failure, unspecified: Secondary | ICD-10-CM | POA: Diagnosis present

## 2021-10-16 DIAGNOSIS — K8050 Calculus of bile duct without cholangitis or cholecystitis without obstruction: Secondary | ICD-10-CM | POA: Diagnosis not present

## 2021-10-16 DIAGNOSIS — I679 Cerebrovascular disease, unspecified: Secondary | ICD-10-CM | POA: Diagnosis present

## 2021-10-16 DIAGNOSIS — N1831 Chronic kidney disease, stage 3a: Secondary | ICD-10-CM | POA: Diagnosis present

## 2021-10-16 DIAGNOSIS — E039 Hypothyroidism, unspecified: Secondary | ICD-10-CM | POA: Diagnosis present

## 2021-10-16 DIAGNOSIS — R109 Unspecified abdominal pain: Secondary | ICD-10-CM | POA: Diagnosis present

## 2021-10-16 DIAGNOSIS — J9611 Chronic respiratory failure with hypoxia: Secondary | ICD-10-CM | POA: Diagnosis present

## 2021-10-16 DIAGNOSIS — Z7901 Long term (current) use of anticoagulants: Secondary | ICD-10-CM | POA: Diagnosis not present

## 2021-10-16 DIAGNOSIS — Z7902 Long term (current) use of antithrombotics/antiplatelets: Secondary | ICD-10-CM | POA: Diagnosis not present

## 2021-10-16 DIAGNOSIS — E785 Hyperlipidemia, unspecified: Secondary | ICD-10-CM | POA: Diagnosis present

## 2021-10-16 DIAGNOSIS — Z6829 Body mass index (BMI) 29.0-29.9, adult: Secondary | ICD-10-CM | POA: Diagnosis not present

## 2021-10-16 DIAGNOSIS — K8070 Calculus of gallbladder and bile duct without cholecystitis without obstruction: Secondary | ICD-10-CM | POA: Diagnosis not present

## 2021-10-16 DIAGNOSIS — D631 Anemia in chronic kidney disease: Secondary | ICD-10-CM | POA: Diagnosis present

## 2021-10-16 DIAGNOSIS — J449 Chronic obstructive pulmonary disease, unspecified: Secondary | ICD-10-CM | POA: Diagnosis not present

## 2021-10-16 DIAGNOSIS — J439 Emphysema, unspecified: Secondary | ICD-10-CM | POA: Diagnosis present

## 2021-10-16 DIAGNOSIS — R7989 Other specified abnormal findings of blood chemistry: Secondary | ICD-10-CM | POA: Diagnosis not present

## 2021-10-16 DIAGNOSIS — Z20822 Contact with and (suspected) exposure to covid-19: Secondary | ICD-10-CM | POA: Diagnosis present

## 2021-10-16 DIAGNOSIS — Y848 Other medical procedures as the cause of abnormal reaction of the patient, or of later complication, without mention of misadventure at the time of the procedure: Secondary | ICD-10-CM | POA: Diagnosis not present

## 2021-10-16 DIAGNOSIS — E440 Moderate protein-calorie malnutrition: Secondary | ICD-10-CM | POA: Diagnosis present

## 2021-10-16 DIAGNOSIS — Z8249 Family history of ischemic heart disease and other diseases of the circulatory system: Secondary | ICD-10-CM | POA: Diagnosis not present

## 2021-10-16 DIAGNOSIS — R1011 Right upper quadrant pain: Secondary | ICD-10-CM | POA: Diagnosis not present

## 2021-10-16 DIAGNOSIS — G8929 Other chronic pain: Secondary | ICD-10-CM | POA: Diagnosis present

## 2021-10-16 DIAGNOSIS — I130 Hypertensive heart and chronic kidney disease with heart failure and stage 1 through stage 4 chronic kidney disease, or unspecified chronic kidney disease: Secondary | ICD-10-CM | POA: Diagnosis present

## 2021-10-16 DIAGNOSIS — K858 Other acute pancreatitis without necrosis or infection: Secondary | ICD-10-CM | POA: Diagnosis not present

## 2021-10-16 DIAGNOSIS — K831 Obstruction of bile duct: Secondary | ICD-10-CM | POA: Diagnosis not present

## 2021-10-16 DIAGNOSIS — Z823 Family history of stroke: Secondary | ICD-10-CM | POA: Diagnosis not present

## 2021-10-16 DIAGNOSIS — I739 Peripheral vascular disease, unspecified: Secondary | ICD-10-CM | POA: Diagnosis present

## 2021-10-16 DIAGNOSIS — I2510 Atherosclerotic heart disease of native coronary artery without angina pectoris: Secondary | ICD-10-CM | POA: Diagnosis not present

## 2021-10-16 DIAGNOSIS — R1010 Upper abdominal pain, unspecified: Secondary | ICD-10-CM | POA: Diagnosis not present

## 2021-10-16 DIAGNOSIS — Z951 Presence of aortocoronary bypass graft: Secondary | ICD-10-CM | POA: Diagnosis not present

## 2021-10-16 DIAGNOSIS — K9189 Other postprocedural complications and disorders of digestive system: Secondary | ICD-10-CM | POA: Diagnosis not present

## 2021-10-16 DIAGNOSIS — I10 Essential (primary) hypertension: Secondary | ICD-10-CM | POA: Diagnosis not present

## 2021-10-16 DIAGNOSIS — K8071 Calculus of gallbladder and bile duct without cholecystitis with obstruction: Secondary | ICD-10-CM | POA: Diagnosis present

## 2021-10-16 LAB — COMPREHENSIVE METABOLIC PANEL
ALT: 148 U/L — ABNORMAL HIGH (ref 0–44)
AST: 191 U/L — ABNORMAL HIGH (ref 15–41)
Albumin: 2.4 g/dL — ABNORMAL LOW (ref 3.5–5.0)
Alkaline Phosphatase: 404 U/L — ABNORMAL HIGH (ref 38–126)
Anion gap: 4 — ABNORMAL LOW (ref 5–15)
BUN: 16 mg/dL (ref 8–23)
CO2: 25 mmol/L (ref 22–32)
Calcium: 8.2 mg/dL — ABNORMAL LOW (ref 8.9–10.3)
Chloride: 106 mmol/L (ref 98–111)
Creatinine, Ser: 1.26 mg/dL — ABNORMAL HIGH (ref 0.61–1.24)
GFR, Estimated: 59 mL/min — ABNORMAL LOW (ref >60)
Glucose, Bld: 139 mg/dL — ABNORMAL HIGH (ref 70–99)
Potassium: 3.9 mmol/L (ref 3.5–5.1)
Sodium: 135 mmol/L (ref 135–145)
Total Bilirubin: 7.3 mg/dL — ABNORMAL HIGH (ref 0.3–1.2)
Total Protein: 6 g/dL — ABNORMAL LOW (ref 6.5–8.1)

## 2021-10-16 LAB — CBC WITH DIFFERENTIAL/PLATELET
Abs Immature Granulocytes: 0.1 10*3/uL — ABNORMAL HIGH (ref 0.00–0.07)
Basophils Absolute: 0 10*3/uL (ref 0.0–0.1)
Basophils Relative: 0 %
Eosinophils Absolute: 0.1 10*3/uL (ref 0.0–0.5)
Eosinophils Relative: 1 %
HCT: 29.8 % — ABNORMAL LOW (ref 39.0–52.0)
Hemoglobin: 9.9 g/dL — ABNORMAL LOW (ref 13.0–17.0)
Immature Granulocytes: 1 %
Lymphocytes Relative: 5 %
Lymphs Abs: 1 10*3/uL (ref 0.7–4.0)
MCH: 33 pg (ref 26.0–34.0)
MCHC: 33.2 g/dL (ref 30.0–36.0)
MCV: 99.3 fL (ref 80.0–100.0)
Monocytes Absolute: 1 10*3/uL (ref 0.1–1.0)
Monocytes Relative: 5 %
Neutro Abs: 17.3 10*3/uL — ABNORMAL HIGH (ref 1.7–7.7)
Neutrophils Relative %: 88 %
Platelets: 345 10*3/uL (ref 150–400)
RBC: 3 MIL/uL — ABNORMAL LOW (ref 4.22–5.81)
RDW: 15.5 % (ref 11.5–15.5)
WBC: 19.6 10*3/uL — ABNORMAL HIGH (ref 4.0–10.5)
nRBC: 0 % (ref 0.0–0.2)

## 2021-10-16 MED ORDER — ENOXAPARIN SODIUM 40 MG/0.4ML IJ SOSY
40.0000 mg | PREFILLED_SYRINGE | INTRAMUSCULAR | Status: AC
  Administered 2021-10-17: 40 mg via SUBCUTANEOUS
  Filled 2021-10-16: qty 0.4

## 2021-10-16 MED ORDER — TRAMADOL HCL 50 MG PO TABS
50.0000 mg | ORAL_TABLET | Freq: Four times a day (QID) | ORAL | Status: DC | PRN
  Administered 2021-10-20 – 2021-10-22 (×3): 50 mg via ORAL
  Filled 2021-10-16 (×3): qty 1

## 2021-10-16 MED ORDER — CLOPIDOGREL BISULFATE 75 MG PO TABS: 75.0000 mg | ORAL_TABLET | Freq: Every day | ORAL | 0 refills | Status: DC

## 2021-10-16 NOTE — Progress Notes (Signed)
PROGRESS NOTE Dorr Perrot  FRT:[id number] DOB: September 02, 1945 DOA: 10/14/2021 PCP: Lorrene Reid, PA-C   Brief Narrative/Hospital Course: 76 year old gentleman with medical history of CAD multiple vessel with CABG, PCI, carotid artery occlusion, hypertension, HLD, PAD, hypertension, COPD/chronic respiratory failure on 4 L nasal cannula, gout followed by Dr. Rush Landmark for abdominal pain, with H. pylori antigen positive treated and eradicated confirmed with EGD on 07/15/2019 has had abnormal LFTs for several months and recently worsened, underwent further work-up with CT scan of the abdomen pelvis and still jaundiced and directed to the ED for admission. In the ED, hemodynamically stable, on room air, labs shows creatinine at 1.5 alk phos 442 AST 132 ALT 144 total bili 4.2-Labs on 215 over alk phos 426, AST 180 ALT 197 and TB 7.1 GGT 330. GI was consulted in the ED planning for MRCP observation admission and repeat labs in a.m. to see if he needs ERCP or not. MRCP came back with cholelithiasis and choledocholithiasis.    Subjective: Seen and examined this morning resting comfortably Patient got confused after taking oxycodone for his right leg pain yesterday Overnight no fever Labs with stable renal function AST ALT trending up along with total bili 4>7, no leukocytosis or fever  Assessment and Plan: * Pain in the abdomen- (present on admission) Cholelithiasis and choledocholithiasis symptomatic Elevated LFTs due to choledocholithiasis Abdominal pain nausea and weight loss: MRI shows cholelithiasis and choledocholithiasis.  Patient's presentation due to cholelithiasis and symptomatic choledocholithiasis.GI planning for ERCP after Plavix washout 4 to 5 days.  Discussed with Dr. Loletha Carrow from GI-he had done extensive review of the schedules and he had discussed with multiple doctors-no slots available in the next 2 weeks so patient will need to stay in the hospital for ERCP anticipating slot opening  Monday as inpatient ERCP, patient will need to stay off Plavix for 4 to 5 days, he will likely need to stay overnight after ERCP given his chronic respiratory failure COPD and comorbidities.Trend LFTs, continue diet pain control IV fluids.  Disposition will be based upon GI plan.   Cholelithiasis with choledocholithiasis- (present on admission) .  Abnormal LFTs- (present on admission) .  Chronic respiratory failure with hypoxia (HCC) - SaO2 drops to 87% with ambulation- (present on admission) .  Chronic obstructive pulmonary disease (HCC)- (present on admission) COPD Chronic hypoxic respiratory failure on 4 L: Not in exacerbation continue home inhalers, home oxygen.  Watch closely in perioperative setting for ERCP  CAD, multiple vessel - status post CABG: LIMA-LAD, SVG-RPDA; PCI native RCA for 100% SVG-RCA- (present on admission) CAD with history of CABG and stent HLD PVD History of carotid stenosis: No chest pain currently.  Continue metoprolol but holding Plavix and statin.    Essential hypertension- (present on admission) BP controlled on home metoprolol  Hyperlipidemia with target LDL less than 70- (present on admission) Holding statin due to abnormal LFTs  Peripheral arterial occlusive disease ([place])- (present on admission) .  Malnutrition of moderate degree Dietitian on board, augment diet Nutrition Status: Nutrition Problem: Moderate Malnutrition Etiology: acute illness (cholelithiasis) Signs/Symptoms: mild fat depletion, mild muscle depletion Interventions: Education    Anemia due to chronic kidney disease Hemoglobin is stable   Stage 3a chronic kidney disease (CKD) (HCC) Baseline creatinine 1.3-1.5 stable.  Creatinine improved with IV fluid hydration continue the same Recent Labs  Lab 10/14/21 1407 10/15/21 0444 10/16/21 0444  BUN _0 CREATININE 1.54* 1.30* 1.26*    Hypothyroidism- (present on admission) Continue Synthroid   DVT prophylaxis:  enoxaparin (LOVENOX) injection 40 mg Start: 10/16/21 0600 SCDs Start: 10/14/21 1628 add Lovenox Code Status:   Code Status: Full Code Family Communication: plan of care discussed with patient/ at bedside.  Disposition: Currently not medically stable for discharge. Status is: Admitted as observation The patient remains hospitalized will need at least 2 midnight stay for ongoing management for symptomatic choledocholithiasis for ERCP as planned by GI-for inpatient ERCP coming Monday after Plavix washout, no outpatient slot available as per GI.  Objective: Vitals last 24 hrs: Vitals:   10/15/21 0948 10/15/21 1333 10/15/21 2124 10/16/21 0530  BP: (!) 143/65 125/65 110/65 109/70  Pulse: 73 73 97 73  Resp: _0 Temp: 98 F (36.7 C) (!) 97.5 F (36.4 C) 98.7 F (37.1 C) 97.6 F (36.4 C)  TempSrc: Oral Oral Oral Oral  SpO2: 100% 98% 99% 100%  Weight:      Height:       Weight change:   Physical Examination: General exam: AA0X3, older than stated age, weak appearing. HEENT:Oral mucosa moist, icterus ++, ear/Nose WNL grossly, dentition normal. Respiratory system: bilaterally diminished, no use of accessory muscle Cardiovascular system: S1 & S2 +, No JVD,. Gastrointestinal system: Abdomen soft,NT,ND,BS+ Nervous System:Alert, awake, moving extremities and grossly nonfocal Extremities: LE ankle edema none, distal peripheral pulses palpable.  Skin: No rashes,no icterus. MSK: Normal muscle bulk,tone, power   Medications reviewed:  Scheduled Meds:  enoxaparin (LOVENOX) injection  40 mg Subcutaneous Q24H   fenofibrate  54 mg Oral Daily   levothyroxine  75 mcg Oral Q0600   metoprolol tartrate  25 mg Oral BID   pantoprazole  40 mg Oral Daily   Continuous Infusions:  lactated ringers 75 mL/hr at 10/16/21 0721      Diet Order             Diet regular Room service appropriate? Yes; Fluid consistency: Thin  Diet effective now                    Nutrition Problem:  Moderate Malnutrition Etiology: acute illness (cholelithiasis) Signs/Symptoms: mild fat depletion, mild muscle depletion Interventions: Education   Intake/Output Summary (Last 24 hours) at 10/16/2021 1235 Last data filed at 10/16/2021 1000 Gross per 24 hour  Intake 2160 ml  Output 450 ml  Net 1710 ml   Net IO Since Admission: 1,820 mL [10/16/21 1235]  Wt Readings from Last 3 Encounters:  10/14/21 85.7 kg  07/14/21 90.5 kg  06/17/21 90.5 kg     Unresulted Labs (From admission, onward)     Start     Ordered   10/16/21 0500  Comprehensive metabolic panel  Daily,   R      10/15/21 1250          Data Reviewed: I have personally reviewed following labs and imaging studies CBC: Recent Labs  Lab 10/14/21 1407 10/15/21 0444 10/16/21 1114  WBC 7.8 7.5 19.6*  NEUTROABS 5.6  --  17.3*  HGB 12.2* 10.0* 9.9*  HCT 37.1* 31.7* 29.8*  MCV 98.1 101.6* 99.3  PLT 438* 361 828   Basic Metabolic Panel: Recent Labs  Lab 10/14/21 1407 10/15/21 0444 10/16/21 0444  NA 136 136 135  K 4.2 3.7 3.9  CL 102 105 106  CO2 _1 GLUCOSE 90 87 139*  BUN _2 CREATININE 1.54* 1.30* 1.26*  CALCIUM 9.2 8.6* 8.2*   GFR: Estimated Creatinine Clearance: 51.3 mL/min (A) (by C-G formula based on SCr of  1.26 mg/dL (H)). Liver Function Tests: Recent Labs  Lab 10/14/21 1407 10/15/21 0444 10/16/21 0444  AST 132* 108* 191*  ALT 144* 109* 148*  ALKPHOS 442* 396* 404*  BILITOT 4.2* 4.0* 7.3*  PROT 7.6 6.0* 6.0*  ALBUMIN 3.1* 2.5* 2.4*   Recent Labs  Lab 10/14/21 1407  LIPASE 39   No results for input(s): AMMONIA in the last 168 hours. Coagulation Profile: No results for input(s): INR, PROTIME in the last 168 hours. Cardiac Enzymes: No results for input(s): CKTOTAL, CKMB, CKMBINDEX, TROPONINI in the last 168 hours. BNP (last 3 results) No results for input(s): PROBNP in the last 8760 hours. HbA1C: No results for input(s): HGBA1C in the last 72 hours. CBG: No results for  input(s): GLUCAP in the last 168 hours. Lipid Profile: No results for input(s): CHOL, HDL, LDLCALC, TRIG, CHOLHDL, LDLDIRECT in the last 72 hours. Thyroid Function Tests: No results for input(s): TSH, T4TOTAL, FREET4, T3FREE, THYROIDAB in the last 72 hours. Anemia Panel: No results for input(s): VITAMINB12, FOLATE, FERRITIN, TIBC, IRON, RETICCTPCT in the last 72 hours. Sepsis Labs: No results for input(s): PROCALCITON, LATICACIDVEN in the last 168 hours.  Recent Results (from the past 350 hour(s))  Helicobacter pylori special antigen     Status: None   Collection Time: 10/13/21 11:52 AM   Specimen: Blood  Result Value Ref Range Status   MICRO NUMBER: [id number]  Final   SPECIMEN QUALITY Adequate  Final   SOURCE: STOOL  Final   STATUS: FINAL  Final   RESULT:   Final    Not Detected  Antimicrobials, proton pump inhibitors, and bismuth preparations inhibit H. pylori and ingestion up to two weeks prior to testing may cause false negative results. If clinically indicated the test should be repeated on a new specimen  obtained two weeks after discontinuing treatment.     Comment: Reference Range:Not Detected  Resp Panel by RT-PCR (Flu A&B, Covid) Nasopharyngeal Swab     Status: None   Collection Time: 10/14/21  4:17 PM   Specimen: Nasopharyngeal Swab; Nasopharyngeal(NP) swabs in vial transport medium  Result Value Ref Range Status   SARS Coronavirus 2 by RT PCR NEGATIVE NEGATIVE Final    Comment: (NOTE) SARS-CoV-2 target nucleic acids are NOT DETECTED.  The SARS-CoV-2 RNA is generally detectable in upper respiratory specimens during the acute phase of infection. The lowest concentration of SARS-CoV-2 viral copies this assay can detect is 138 copies/mL. A negative result does not preclude SARS-Cov-2 infection and should not be used as the sole basis for treatment or other patient management decisions. A negative result may occur with  improper specimen collection/handling, submission of  specimen other than nasopharyngeal swab, presence of viral mutation(s) within the areas targeted by this assay, and inadequate number of viral copies(<138 copies/mL). A negative result must be combined with clinical observations, patient history, and epidemiological information. The expected result is Negative.  Fact Sheet for Patients:  EntrepreneurPulse.com.au  Fact Sheet for Healthcare Providers:  IncredibleEmployment.be  This test is no t yet approved or cleared by the [place] FDA and  has been authorized for detection and/or diagnosis of SARS-CoV-2 by FDA under an Emergency Use Authorization (EUA). This EUA will remain  in effect (meaning this test can be used) for the duration of the COVID-19 declaration under Section 564(b)(1) of the Act, 21 U.S.C.section 360bbb-3(b)(1), unless the authorization is terminated  or revoked sooner.       Influenza A by PCR NEGATIVE NEGATIVE Final   Influenza  B by PCR NEGATIVE NEGATIVE Final    Comment: (NOTE) The Xpert Xpress SARS-CoV-2/FLU/RSV plus assay is intended as an aid in the diagnosis of influenza from Nasopharyngeal swab specimens and should not be used as a sole basis for treatment. Nasal washings and aspirates are unacceptable for Xpert Xpress SARS-CoV-2/FLU/RSV testing.  Fact Sheet for Patients: EntrepreneurPulse.com.au  Fact Sheet for Healthcare Providers: IncredibleEmployment.be  This test is not yet approved or cleared by the [place] FDA and has been authorized for detection and/or diagnosis of SARS-CoV-2 by FDA under an Emergency Use Authorization (EUA). This EUA will remain in effect (meaning this test can be used) for the duration of the COVID-19 declaration under Section 564(b)(1) of the Act, 21 U.S.C. section 360bbb-3(b)(1), unless the authorization is terminated or revoked.  Performed at [facility], [place]  [address], [place], [place] [address]     Antimicrobials: Anti-infectives (From admission, onward)    None      Culture/Microbiology No results found for: SDES, SPECREQUEST, CULT, REPTSTATUS  Other culture-see note   Radiology Studies: MR 3D Recon At Scanner  Result Date: 10/15/2021 CLINICAL DATA:  76 year old male with history of elevated liver function tests. Jaundice. EXAM: MRI ABDOMEN WITH CONTRAST (WITH MRCP) TECHNIQUE: Multiplanar multisequence MR imaging of the abdomen was performed following the administration of intravenous contrast. Heavily T2-weighted images of the biliary and pancreatic ducts were obtained, and three-dimensional MRCP images were rendered by post processing. CONTRAST:  34m GADAVIST GADOBUTROL 1 MMOL/ML IV SOLN COMPARISON:  No prior abdominal MRI. Abdominal ultrasound 10/14/2021. CT the abdomen and pelvis 10/13/2021. FINDINGS: Comment: Portions of today's examination are limited by considerable patient respiratory motion. Lower chest: Signal in the dependent portion of the left lower lobe, likely reflective of subsegmental atelectasis, adjacent to a trace left pleural effusion, but poorly evaluated on today's magnetic resonance examination. Signal void in the lower sternum, presumably from median sternotomy wires. Hepatobiliary: No suspicious cystic or solid hepatic lesions. MRCP images demonstrates some very mild intrahepatic biliary ductal dilatation. Common bile duct is dilated up to 11 mm in the porta hepatis. Gallbladder is nearly completely contracted. Small filling defects are evident in the gallbladder, indicative of tiny gallstones. No pericholecystic fluid. Gallbladder wall does not appear thickened. In the distal common bile duct there are some small filling defects concerning for choledocholithiasis. The largest of these measures up to 7 mm in diameter immediately before the level of the ampulla. Pancreas: There is some subtle focal enhancement at the level of  the ampulla (axial image 64 of series 22), which may be related to indwelling stones. This is not mass-like in appearance, and measures only approximately 4 mm. No other potential pancreatic mass. No pancreatic ductal dilatation noted on MRCP images. No peripancreatic fluid collections or inflammatory changes. Spleen:  Unremarkable. Adrenals/Urinary Tract: Small T1 hypointense, T2 hyperintense, nonenhancing lesions in both kidneys, compatible with tiny simple cysts. No hydroureteronephrosis in the visualized portions of the abdomen. Bilateral adrenal glands are normal in appearance. Stomach/Bowel: Unremarkable. Vascular/Lymphatic: Aortic atherosclerosis. No aneurysm identified in the visualized abdominal vasculature. No lymphadenopathy noted in the abdomen. Other: No significant volume of ascites noted in the visualized portions of the peritoneal cavity. Musculoskeletal: No aggressive appearing osseous lesions are noted in the visualized portions of the skeleton. IMPRESSION: 1. Study is positive for cholelithiasis in addition to choledocholithiasis. There are several small stones in the distal common bile duct extending to the level of the ampulla. Additionally, at the level of the ampulla there  is some very subtle focal enhancement. The possibility of an ampullary mass is not excluded, but not strongly favored (this enhancement may simply be inflammatory in the setting of indwelling stones impacted in the distal common bile duct). At this time, this is associated with only mild proximal intra and extrahepatic biliary ductal dilatation. Further evaluation with ERCP and endoscopic ultrasound should be considered if clinically appropriate. 2. Trace left pleural effusion with probable subsegmental atelectasis in the left lower lobe. 3. Aortic atherosclerosis. Electronically Signed   By: Vinnie Langton M.D.   On: 10/15/2021 05:20   MR ABDOMEN MRCP W WO CONTAST  Result Date: 10/15/2021 CLINICAL DATA:  77 year old  male with history of elevated liver function tests. Jaundice. EXAM: MRI ABDOMEN WITH CONTRAST (WITH MRCP) TECHNIQUE: Multiplanar multisequence MR imaging of the abdomen was performed following the administration of intravenous contrast. Heavily T2-weighted images of the biliary and pancreatic ducts were obtained, and three-dimensional MRCP images were rendered by post processing. CONTRAST:  7m GADAVIST GADOBUTROL 1 MMOL/ML IV SOLN COMPARISON:  No prior abdominal MRI. Abdominal ultrasound 10/14/2021. CT the abdomen and pelvis 10/13/2021. FINDINGS: Comment: Portions of today's examination are limited by considerable patient respiratory motion. Lower chest: Signal in the dependent portion of the left lower lobe, likely reflective of subsegmental atelectasis, adjacent to a trace left pleural effusion, but poorly evaluated on today's magnetic resonance examination. Signal void in the lower sternum, presumably from median sternotomy wires. Hepatobiliary: No suspicious cystic or solid hepatic lesions. MRCP images demonstrates some very mild intrahepatic biliary ductal dilatation. Common bile duct is dilated up to 11 mm in the porta hepatis. Gallbladder is nearly completely contracted. Small filling defects are evident in the gallbladder, indicative of tiny gallstones. No pericholecystic fluid. Gallbladder wall does not appear thickened. In the distal common bile duct there are some small filling defects concerning for choledocholithiasis. The largest of these measures up to 7 mm in diameter immediately before the level of the ampulla. Pancreas: There is some subtle focal enhancement at the level of the ampulla (axial image 64 of series 22), which may be related to indwelling stones. This is not mass-like in appearance, and measures only approximately 4 mm. No other potential pancreatic mass. No pancreatic ductal dilatation noted on MRCP images. No peripancreatic fluid collections or inflammatory changes. Spleen:   Unremarkable. Adrenals/Urinary Tract: Small T1 hypointense, T2 hyperintense, nonenhancing lesions in both kidneys, compatible with tiny simple cysts. No hydroureteronephrosis in the visualized portions of the abdomen. Bilateral adrenal glands are normal in appearance. Stomach/Bowel: Unremarkable. Vascular/Lymphatic: Aortic atherosclerosis. No aneurysm identified in the visualized abdominal vasculature. No lymphadenopathy noted in the abdomen. Other: No significant volume of ascites noted in the visualized portions of the peritoneal cavity. Musculoskeletal: No aggressive appearing osseous lesions are noted in the visualized portions of the skeleton. IMPRESSION: 1. Study is positive for cholelithiasis in addition to choledocholithiasis. There are several small stones in the distal common bile duct extending to the level of the ampulla. Additionally, at the level of the ampulla there is some very subtle focal enhancement. The possibility of an ampullary mass is not excluded, but not strongly favored (this enhancement may simply be inflammatory in the setting of indwelling stones impacted in the distal common bile duct). At this time, this is associated with only mild proximal intra and extrahepatic biliary ductal dilatation. Further evaluation with ERCP and endoscopic ultrasound should be considered if clinically appropriate. 2. Trace left pleural effusion with probable subsegmental atelectasis in the left lower lobe. 3.  Aortic atherosclerosis. Electronically Signed   By: Vinnie Langton M.D.   On: 10/15/2021 05:20   US Abdomen Limited RUQ (LIVER/GB)  Result Date: 10/14/2021 CLINICAL DATA:  Elevated LFTs EXAM: ULTRASOUND ABDOMEN LIMITED RIGHT UPPER QUADRANT COMPARISON:  CT 10/13/2021 FINDINGS: Gallbladder: Incompletely distended. No definite stones. Sonographer describes no sonographic Murphy sign. Common bile duct: Diameter: 2.4 mm, unremarkable Liver: No focal lesion identified. Within normal limits in  parenchymal echogenicity. No intrahepatic biliary ductal dilatation evident. Portal vein is patent on color Doppler imaging with normal direction of blood flow towards the liver. Other: Technologist describes technically difficult study secondary to bowel gas and body habitus. IMPRESSION: Negative Electronically Signed   By: Lucrezia Europe M.D.   On: 10/14/2021 14:35     LOS: 0 days   Antonieta Pert, MD [facility]  10/16/2021, 12:35 PM

## 2021-10-16 NOTE — Progress Notes (Signed)
Patient ID: Michael [name], male   DOB: [birth year], 76 y.o.   MRN: [id number]    Progress Note   Subjective   Day # 2  CC; Jaundice, bile duct stones  Labs today T. bili up to 7.3/alk phos 4 4/AST 199/ALT 148  Patient said he had another episode of abdominal pain and nausea yesterday, he is convinced this is because he did not have his acid blocker before he ate.  He is denying any pain today or nausea, no fever or chills  Also relates that he took oxycodone yesterday for leg pain and apparently became confused afterwards     Objective   Vital signs in last 24 hours: Temp:  [97.5 F (36.4 C)-98.7 F (37.1 C)] 97.6 F (36.4 C) (02/23 0530) Pulse Rate:  [73-97] 73 (02/23 0530) Resp:  [16-18] 16 (02/23 0530) BP: (109-143)/(65-70) 109/70 (02/23 0530) SpO2:  [98 %-100 %] 100 % (02/23 0530) Last BM Date : 10/13/21 General:    Elderly white male in NAD Jaundiced Heart:  Regular rate and rhythm; no murmurs Lungs clear but decreased breath sounds bilaterally-on O2 4 L Abdomen:  Soft, nontender and nondistended. Normal bowel sounds. Extremities:  Without edema. Neurologic:  Alert and oriented,  grossly normal neurologically. Psych:  Cooperative. Normal mood and affect.  Intake/Output from previous day: 02/22 0701 - 02/23 0700 In: 1740 [P.O.:840; I.V.:900] Out: 700 [Urine:700] Intake/Output this shift: No intake/output data recorded.  Lab Results: Recent Labs    10/14/21 1407 10/15/21 0444  WBC 7.8 7.5  HGB 12.2* 10.0*  HCT 37.1* 31.7*  PLT 438* 361   BMET Recent Labs    10/14/21 1407 10/15/21 0444 10/16/21 0444  NA 136 136 135  K 4.2 3.7 3.9  CL 102 105 106  CO2 26 26 25   GLUCOSE 90 87 139*  BUN 21 17 16   CREATININE 1.54* 1.30* 1.26*  CALCIUM 9.2 8.6* 8.2*   LFT Recent Labs    10/14/21 1407 10/15/21 0444 10/16/21 0444  PROT 7.6   < > 6.0*  ALBUMIN 3.1*   < > 2.4*  AST 132*   < > 191*  ALT 144*   < > 148*  ALKPHOS 442*   < > 404*  BILITOT 4.2*   < >  7.3*  BILIDIR 2.1*  --   --    < > = values in this interval not displayed.   PT/INR No results for input(s): LABPROT, INR in the last 72 hours.  Studies/Results: MR 3D Recon At Scanner  Result Date: 10/15/2021 CLINICAL DATA:  76 year old male with history of elevated liver function tests. Jaundice. EXAM: MRI ABDOMEN WITH CONTRAST (WITH MRCP) TECHNIQUE: Multiplanar multisequence MR imaging of the abdomen was performed following the administration of intravenous contrast. Heavily T2-weighted images of the biliary and pancreatic ducts were obtained, and three-dimensional MRCP images were rendered by post processing. CONTRAST:  79m GADAVIST GADOBUTROL 1 MMOL/ML IV SOLN COMPARISON:  No prior abdominal MRI. Abdominal ultrasound 10/14/2021. CT the abdomen and pelvis 10/13/2021. FINDINGS: Comment: Portions of today's examination are limited by considerable patient respiratory motion. Lower chest: Signal in the dependent portion of the left lower lobe, likely reflective of subsegmental atelectasis, adjacent to a trace left pleural effusion, but poorly evaluated on today's magnetic resonance examination. Signal void in the lower sternum, presumably from median sternotomy wires. Hepatobiliary: No suspicious cystic or solid hepatic lesions. MRCP images demonstrates some very mild intrahepatic biliary ductal dilatation. Common bile duct is dilated up to 11 mm in  the porta hepatis. Gallbladder is nearly completely contracted. Small filling defects are evident in the gallbladder, indicative of tiny gallstones. No pericholecystic fluid. Gallbladder wall does not appear thickened. In the distal common bile duct there are some small filling defects concerning for choledocholithiasis. The largest of these measures up to 7 mm in diameter immediately before the level of the ampulla. Pancreas: There is some subtle focal enhancement at the level of the ampulla (axial image 64 of series 22), which may be related to indwelling  stones. This is not mass-like in appearance, and measures only approximately 4 mm. No other potential pancreatic mass. No pancreatic ductal dilatation noted on MRCP images. No peripancreatic fluid collections or inflammatory changes. Spleen:  Unremarkable. Adrenals/Urinary Tract: Small T1 hypointense, T2 hyperintense, nonenhancing lesions in both kidneys, compatible with tiny simple cysts. No hydroureteronephrosis in the visualized portions of the abdomen. Bilateral adrenal glands are normal in appearance. Stomach/Bowel: Unremarkable. Vascular/Lymphatic: Aortic atherosclerosis. No aneurysm identified in the visualized abdominal vasculature. No lymphadenopathy noted in the abdomen. Other: No significant volume of ascites noted in the visualized portions of the peritoneal cavity. Musculoskeletal: No aggressive appearing osseous lesions are noted in the visualized portions of the skeleton. IMPRESSION: 1. Study is positive for cholelithiasis in addition to choledocholithiasis. There are several small stones in the distal common bile duct extending to the level of the ampulla. Additionally, at the level of the ampulla there is some very subtle focal enhancement. The possibility of an ampullary mass is not excluded, but not strongly favored (this enhancement may simply be inflammatory in the setting of indwelling stones impacted in the distal common bile duct). At this time, this is associated with only mild proximal intra and extrahepatic biliary ductal dilatation. Further evaluation with ERCP and endoscopic ultrasound should be considered if clinically appropriate. 2. Trace left pleural effusion with probable subsegmental atelectasis in the left lower lobe. 3. Aortic atherosclerosis. Electronically Signed   By: Vinnie Langton M.D.   On: 10/15/2021 05:20   MR ABDOMEN MRCP W WO CONTAST  Result Date: 10/15/2021 CLINICAL DATA:  76 year old male with history of elevated liver function tests. Jaundice. EXAM: MRI ABDOMEN  WITH CONTRAST (WITH MRCP) TECHNIQUE: Multiplanar multisequence MR imaging of the abdomen was performed following the administration of intravenous contrast. Heavily T2-weighted images of the biliary and pancreatic ducts were obtained, and three-dimensional MRCP images were rendered by post processing. CONTRAST:  16mL GADAVIST GADOBUTROL 1 MMOL/ML IV SOLN COMPARISON:  No prior abdominal MRI. Abdominal ultrasound 10/14/2021. CT the abdomen and pelvis 10/13/2021. FINDINGS: Comment: Portions of today's examination are limited by considerable patient respiratory motion. Lower chest: Signal in the dependent portion of the left lower lobe, likely reflective of subsegmental atelectasis, adjacent to a trace left pleural effusion, but poorly evaluated on today's magnetic resonance examination. Signal void in the lower sternum, presumably from median sternotomy wires. Hepatobiliary: No suspicious cystic or solid hepatic lesions. MRCP images demonstrates some very mild intrahepatic biliary ductal dilatation. Common bile duct is dilated up to 11 mm in the porta hepatis. Gallbladder is nearly completely contracted. Small filling defects are evident in the gallbladder, indicative of tiny gallstones. No pericholecystic fluid. Gallbladder wall does not appear thickened. In the distal common bile duct there are some small filling defects concerning for choledocholithiasis. The largest of these measures up to 7 mm in diameter immediately before the level of the ampulla. Pancreas: There is some subtle focal enhancement at the level of the ampulla (axial image 64 of series 22), which  may be related to indwelling stones. This is not mass-like in appearance, and measures only approximately 4 mm. No other potential pancreatic mass. No pancreatic ductal dilatation noted on MRCP images. No peripancreatic fluid collections or inflammatory changes. Spleen:  Unremarkable. Adrenals/Urinary Tract: Small T1 hypointense, T2 hyperintense,  nonenhancing lesions in both kidneys, compatible with tiny simple cysts. No hydroureteronephrosis in the visualized portions of the abdomen. Bilateral adrenal glands are normal in appearance. Stomach/Bowel: Unremarkable. Vascular/Lymphatic: Aortic atherosclerosis. No aneurysm identified in the visualized abdominal vasculature. No lymphadenopathy noted in the abdomen. Other: No significant volume of ascites noted in the visualized portions of the peritoneal cavity. Musculoskeletal: No aggressive appearing osseous lesions are noted in the visualized portions of the skeleton. IMPRESSION: 1. Study is positive for cholelithiasis in addition to choledocholithiasis. There are several small stones in the distal common bile duct extending to the level of the ampulla. Additionally, at the level of the ampulla there is some very subtle focal enhancement. The possibility of an ampullary mass is not excluded, but not strongly favored (this enhancement may simply be inflammatory in the setting of indwelling stones impacted in the distal common bile duct). At this time, this is associated with only mild proximal intra and extrahepatic biliary ductal dilatation. Further evaluation with ERCP and endoscopic ultrasound should be considered if clinically appropriate. 2. Trace left pleural effusion with probable subsegmental atelectasis in the left lower lobe. 3. Aortic atherosclerosis. Electronically Signed   By: Vinnie Langton M.D.   On: 10/15/2021 05:20   US Abdomen Limited RUQ (LIVER/GB)  Result Date: 10/14/2021 CLINICAL DATA:  Elevated LFTs EXAM: ULTRASOUND ABDOMEN LIMITED RIGHT UPPER QUADRANT COMPARISON:  CT 10/13/2021 FINDINGS: Gallbladder: Incompletely distended. No definite stones. Sonographer describes no sonographic Murphy sign. Common bile duct: Diameter: 2.4 mm, unremarkable Liver: No focal lesion identified. Within normal limits in parenchymal echogenicity. No intrahepatic biliary ductal dilatation evident. Portal  vein is patent on color Doppler imaging with normal direction of blood flow towards the liver. Other: Technologist describes technically difficult study secondary to bowel gas and body habitus. IMPRESSION: Negative Electronically Signed   By: Lucrezia Europe M.D.   On: 10/14/2021 14:35       Assessment / Plan:    #55 76 year old white male with complaints of intermittent epigastric pain and poor appetite over the past year, has had associated weight loss MRI/MRCP here confirms cholelithiasis, no definite gallbladder wall thickening, he has a mildly dilated CBD and choledocholithiasis as well as some subtle enhancement at the ampulla  Patient had another episode of pain yesterday which has since resolved and T. 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  Chronic kidney disease  Plan; patient needs to stay in the hospital, there is no availability to coordinate any outpatient ERCP for this gentleman within the next couple of weeks Continue to hold Plavix Follow LFTs Consider adding antibiotic as continues to have intermittent episodes of obstruction    Principal Problem:   Pain in the abdomen Active Problems:   Chronic obstructive pulmonary disease (HCC)   CAD, multiple vessel - status post CABG: LIMA-LAD, SVG-RPDA; PCI native RCA for 100% SVG-RCA   Essential hypertension   Hypothyroidism   Peripheral arterial occlusive disease ([place])   Hyperlipidemia with target LDL less than 70   Chronic  respiratory failure with hypoxia (HCC) - SaO2 drops to 87% with ambulation   Abnormal LFTs   Stage 3a chronic kidney disease (CKD) ([place])   Cholelithiasis with choledocholithiasis   Anemia due to chronic kidney disease   Malnutrition of moderate degree     LOS: 0 days    Yarden Hillis PA-C 10/16/2021, 8:33 AM

## 2021-10-16 NOTE — Assessment & Plan Note (Addendum)
See above

## 2021-10-16 NOTE — Assessment & Plan Note (Addendum)
Dietitian on board, augment diet as below. Nutrition Problem: Moderate Malnutrition Etiology: acute illness (cholelithiasis) Signs/Symptoms: mild fat depletion, mild muscle depletion Interventions: Education

## 2021-10-16 NOTE — Assessment & Plan Note (Addendum)
See above #1 °

## 2021-10-16 NOTE — Assessment & Plan Note (Addendum)
Continue fenofibrate 

## 2021-10-16 NOTE — Assessment & Plan Note (Addendum)
See #1 °

## 2021-10-16 NOTE — Assessment & Plan Note (Addendum)
Stable

## 2021-10-17 DIAGNOSIS — K8070 Calculus of gallbladder and bile duct without cholecystitis without obstruction: Secondary | ICD-10-CM | POA: Diagnosis not present

## 2021-10-17 DIAGNOSIS — K8050 Calculus of bile duct without cholangitis or cholecystitis without obstruction: Secondary | ICD-10-CM | POA: Diagnosis not present

## 2021-10-17 DIAGNOSIS — R7989 Other specified abnormal findings of blood chemistry: Secondary | ICD-10-CM | POA: Diagnosis not present

## 2021-10-17 LAB — COMPREHENSIVE METABOLIC PANEL
ALT: 125 U/L — ABNORMAL HIGH (ref 0–44)
AST: 140 U/L — ABNORMAL HIGH (ref 15–41)
Albumin: 2.3 g/dL — ABNORMAL LOW (ref 3.5–5.0)
Alkaline Phosphatase: 349 U/L — ABNORMAL HIGH (ref 38–126)
Anion gap: 5 (ref 5–15)
BUN: 16 mg/dL (ref 8–23)
CO2: 25 mmol/L (ref 22–32)
Calcium: 8.1 mg/dL — ABNORMAL LOW (ref 8.9–10.3)
Chloride: 106 mmol/L (ref 98–111)
Creatinine, Ser: 1.15 mg/dL (ref 0.61–1.24)
GFR, Estimated: >60 mL/min (ref >60)
Glucose, Bld: 97 mg/dL (ref 70–99)
Potassium: 3.7 mmol/L (ref 3.5–5.1)
Sodium: 136 mmol/L (ref 135–145)
Total Bilirubin: 6.4 mg/dL — ABNORMAL HIGH (ref 0.3–1.2)
Total Protein: 5.6 g/dL — ABNORMAL LOW (ref 6.5–8.1)

## 2021-10-17 MED ORDER — ENOXAPARIN SODIUM 40 MG/0.4ML IJ SOSY: 40.0000 mg | PREFILLED_SYRINGE | INTRAMUSCULAR | Status: DC

## 2021-10-17 MED ORDER — PIPERACILLIN-TAZOBACTAM 3.375 G IVPB
3.3750 g | Freq: Three times a day (TID) | INTRAVENOUS | Status: AC
  Administered 2021-10-17 – 2021-10-20 (×10): 3.375 g via INTRAVENOUS
  Filled 2021-10-17 (×10): qty 50

## 2021-10-17 NOTE — Progress Notes (Addendum)
Patient ID: Michael [name], male   DOB: 03/18/1946, 76 y.o.   MRN: [id number]    Progress Note   Subjective  Day # 3  CC; jaundice, abdominal pain, common bile duct stones  Day #3 of Plavix  T. bili 6.4/alk phos 349/AST 140/ALT 125-stable WBC had gone up to 19.6 yesterday-pending today  Patient is been afebrile but said he did have a cold chill the day before yesterday, he says he has intermittently had some cold chills over the past months. Appetite okay but does not like the food here.  He did not have any recurrent episodes of abdominal pain yesterday or this morning  He says he usually has more difficulty breathing when it is hot or humid, and has not felt as well today    Objective   Vital signs in last 24 hours: Temp:  [97.4 F (36.3 C)-97.9 F (36.6 C)] 97.9 F (36.6 C) (02/24 0441) Pulse Rate:  [68-73] 68 (02/24 0441) Resp:  [18] 18 (02/24 0441) BP: (98-116)/(63-82) 98/82 (02/24 0441) SpO2:  [99 %-100 %] 99 % (02/24 0441) Last BM Date : 10/13/21 General: Elderly white  male in NAD-jaundiced Heart:  Regular rate and rhythm; no murmurs Lungs: Decreased breath sounds bilaterally, no wheezing Abdomen:  Soft, nontender and nondistended. Normal bowel sounds. Extremities:  Without edema. Neurologic:  Alert and oriented,  grossly normal neurologically. Psych:  Cooperative. Normal mood and affect.  Intake/Output from previous day: 02/23 0701 - 02/24 0700 In: 2700 [P.O.:1200; I.V.:1500] Out: 1975 [SWNIO:2703] Intake/Output this shift: Total I/O In: -  Out: 300 [Urine:300]  Lab Results: Recent Labs    10/14/21 1407 10/15/21 0444 10/16/21 1114  WBC 7.8 7.5 19.6*  HGB 12.2* 10.0* 9.9*  HCT 37.1* 31.7* 29.8*  PLT 438* 361 345   BMET Recent Labs    10/15/21 0444 10/16/21 0444 10/17/21 0446  NA 136 135 136  K 3.7 3.9 3.7  CL 105 106 106  CO2 26 25 25   GLUCOSE 87 139* 97  BUN 17 16 16   CREATININE 1.30* 1.26* 1.15  CALCIUM 8.6* 8.2* 8.1*   LFT Recent Labs     10/14/21 1407 10/15/21 0444 10/17/21 0446  PROT 7.6   < > 5.6*  ALBUMIN 3.1*   < > 2.3*  AST 132*   < > 140*  ALT 144*   < > 125*  ALKPHOS 442*   < > 349*  BILITOT 4.2*   < > 6.4*  BILIDIR 2.1*  --   --    < > = values in this interval not displayed.   PT/INR No results for input(s): LABPROT, INR in the last 72 hours.       Assessment / Plan:    #79 76 year old white male with choledocholithiasis, jaundice and symptoms consistent with intermittent biliary obstruction.  T. bili now in the 6 range.  Patient developed leukocytosis yesterday-CBC pending today, has been afebrile  Suspect he is developing early cholangitis. We will start IV Zosyn  Patient is scheduled for ERCP with sphincterotomy and stone removal for Monday to 27 2023. Procedure may need to be done sooner due to concerns with cholangitis. He has been off Plavix 3 days, will discuss with team and may need to proceed with ERCP this weekend  Hold Lovenox in anticipation of possible sooner ERCP  #2 severe COPD, requiring chronic O2 at 4 L 3 chronic antiplatelet therapy-on Plavix-on hold as above #4 carotid artery disease and peripheral arterial disease #5 coronary artery disease status  post CABG and stents #6 chronic kidney disease     Principal Problem:   Pain in the abdomen Active Problems:   Chronic obstructive pulmonary disease (HCC)   CAD, multiple vessel - status post CABG: LIMA-LAD, SVG-RPDA; PCI native RCA for 100% SVG-RCA   Essential hypertension   Hypothyroidism   Peripheral arterial occlusive disease (HCC)   Hyperlipidemia with target LDL less than 70   Chronic respiratory failure with hypoxia (HCC) - SaO2 drops to 87% with ambulation   Abnormal LFTs   Acute renal failure superimposed on stage 3a chronic kidney disease ([place])   Cholelithiasis with choledocholithiasis   Anemia due to chronic kidney disease   Malnutrition of moderate degree   Choledocholithiasis     LOS: 1 day   Carlen Fils PA-C 10/17/2021, 8:36 AM

## 2021-10-17 NOTE — Assessment & Plan Note (Addendum)
See #1 °

## 2021-10-17 NOTE — Progress Notes (Signed)
PROGRESS NOTE Michael [name]  YSA:[id number] DOB: [birth year] DOA: 10/14/2021 PCP: Lorrene Reid, PA-C   Brief Narrative/Hospital Course: 76 year old gentleman with medical history of CAD multiple vessel with CABG, PCI, carotid artery occlusion, hypertension, HLD, PAD, hypertension, COPD/chronic respiratory failure on 4 L nasal cannula, gout followed by Dr. Rush Landmark for abdominal pain, with H. pylori antigen positive treated and eradicated confirmed with EGD on 07/15/2019 has had abnormal LFTs for several months and recently worsened, underwent further work-up with CT scan of the abdomen pelvis and still jaundiced and directed to the ED for admission. In the ED, hemodynamically stable, on room air, labs shows creatinine at 1.5 alk phos 442 AST 132 ALT 144 total bili 4.2-Labs on 215 over alk phos 426, AST 180 ALT 197 and TB 7.1 GGT 330. GI was consulted in the ED planning for MRCP observation admission and repeat labs in a.m. to see if he needs ERCP or not.MRCP came back with cholelithiasis and choledocholithiasis.  Patient remains hospitalized for ERCP after Plavix washout.    Subjective: Seen and examined.  No abdominal pain had some pain previously no fever or chills.  Noted significant right and WBC count   Assessment and Plan: * Pain in the abdomen- (present on admission) Cholelithiasis and choledocholithiasis symptomatic Elevated LFTs due to choledocholithiasis Abdominal pain nausea and weight loss: MRI shows cholelithiasis and choledocholithiasis.  Patient's presentation due to cholelithiasis and symptomatic choledocholithiasis.GI planning for ERCP after Plavix washout 4 to 5 days.  Discussed with Dr. Loletha Carrow from GI-he had done extensive review of the schedules and he had discussed with multiple doctors-no slots available in the next 2 weeks so patient will need to stay in the hospital for ERCP anticipating slot opening Monday as inpatient ERCP, patient will need to stay off Plavix for 4 to 5  days, he will likely need to stay overnight after ERCP given his chronic respiratory failure COPD and comorbidities.continue to trend labs, noted bump in the WBC count-likely ongoing intermittent CBD obstruction-although no fever-possibly needs antibiotic will defer to GI- spoke w/ GI  PA.Cont IVF fluids.    Cholelithiasis with choledocholithiasis- (present on admission) See #1  Abnormal LFTs- (present on admission) See above #1  Acute renal failure superimposed on stage 3a chronic kidney disease ([place]) Suspect patient presented with AKI due to his GI disease nausea vomiting.  Likely baseline creatinine around 1.1 with CKD stage II vs CKD3a given creatinine has improved to 1.1 from 1.5 on presentation with continued IV fluids. Recent Labs  Lab 10/14/21 1407 10/15/21 0444 10/16/21 0444 10/17/21 0446  BUN 21 17 16 16   CREATININE 1.54* 1.30* 1.26* 1.15    Chronic respiratory failure with hypoxia (HCC) - SaO2 drops to 87% with ambulation- (present on admission) .  Chronic obstructive pulmonary disease (HCC)- (present on admission) COPD Chronic hypoxic respiratory failure on 4 L: COPD compensated continue home oxygen 4 L and inhalers.    CAD, multiple vessel - status post CABG: LIMA-LAD, SVG-RPDA; PCI native RCA for 100% SVG-RCA- (present on admission) CAD with history of CABG and stent HLD PVD History of carotid stenosis: No chest pain.Continue metoprolol but holding Plavix and statin.    Essential hypertension- (present on admission) BP controlled on home metoprolol  Hyperlipidemia with target LDL less than 70- (present on admission) Holding statin due to abnormal LFTs  Peripheral arterial occlusive disease ([place])- (present on admission) .  Choledocholithiasis- (present on admission) See #1  Malnutrition of moderate degree- (present on admission) Dietitian on board, augment diet as  below Nutrition Problem: Moderate Malnutrition Etiology: acute illness  (cholelithiasis) Signs/Symptoms: mild fat depletion, mild muscle depletion Interventions: Education    Anemia due to chronic kidney disease Hemoglobin is stable.monitor.   Hypothyroidism- (present on admission) Continue Synthroid   DVT prophylaxis: SCDs Start: 10/14/21 1628 add Lovenox Code Status:   Code Status: Full Code Family Communication: plan of care discussed with patient/ at bedside.  Disposition: Currently not medically stable for discharge. Status is: Admitted as observation The patient remains hospitalized will need at least 2 midnight stay for ongoing management for symptomatic choledocholithiasis for ERCP as planned by GI-for inpatient ERCP coming Monday after Plavix washout, no outpatient slot available as per GI.  Objective: Vitals last 24 hrs: Vitals:   10/16/21 0530 10/16/21 1312 10/16/21 2033 10/17/21 0441  BP: 109/70 110/63 116/66 98/82  Pulse: 73 72 73 68  Resp: 16 18 18 18   Temp: 97.6 F (36.4 C) 97.7 F (36.5 C) (!) 97.4 F (36.3 C) 97.9 F (36.6 C)  TempSrc: Oral Oral Oral Oral  SpO2: 100% 100% 100% 99%  Weight:      Height:       Weight change:   Physical Examination: General exam: AA0X3,older than stated age, weak appearing. HEENT:Oral mucosa moist, Ear/Nose WNL grossly, dentition normal. Respiratory system: bilaterally diminished,no use of accessory muscle Cardiovascular system: S1 & S2 +, No JVD,. Gastrointestinal system: Abdomen soft,NT,ND, BS+ Nervous System:Alert, awake, moving extremities and grossly nonfocal Extremities: edema neg,distal peripheral pulses palpable.  Skin: No rashes,no icterus. MSK: Normal muscle bulk,tone, power  Medications reviewed:  Scheduled Meds:  fenofibrate  54 mg Oral Daily   levothyroxine  75 mcg Oral Q0600   metoprolol tartrate  25 mg Oral BID   pantoprazole  40 mg Oral Daily   Continuous Infusions:  lactated ringers 75 mL/hr at 10/17/21 0050      Diet Order             Diet NPO time  specified  Diet effective midnight           Diet regular Room service appropriate? Yes; Fluid consistency: Thin  Diet effective now                    Nutrition Problem: Moderate Malnutrition Etiology: acute illness (cholelithiasis) Signs/Symptoms: mild fat depletion, mild muscle depletion Interventions: Education   Intake/Output Summary (Last 24 hours) at 10/17/2021 1013 Last data filed at 10/17/2021 0900 Gross per 24 hour  Intake 2280 ml  Output 1975 ml  Net 305 ml   Net IO Since Admission: 1,825 mL [10/17/21 1013]  Wt Readings from Last 3 Encounters:  10/14/21 85.7 kg  07/14/21 90.5 kg  06/17/21 90.5 kg     Unresulted Labs (From admission, onward)     Start     Ordered   10/16/21 0500  Comprehensive metabolic panel  Daily,   R      10/15/21 1250          Data Reviewed: I have personally reviewed following labs and imaging studies CBC: Recent Labs  Lab 10/14/21 1407 10/15/21 0444 10/16/21 1114  WBC 7.8 7.5 19.6*  NEUTROABS 5.6  --  17.3*  HGB 12.2* 10.0* 9.9*  HCT 37.1* 31.7* 29.8*  MCV 98.1 101.6* 99.3  PLT 438* 361 086   Basic Metabolic Panel: Recent Labs  Lab 10/14/21 1407 10/15/21 0444 10/16/21 0444 10/17/21 0446  NA 136 136 135 136  K 4.2 3.7 3.9 3.7  CL 102 105 106 106  CO2 26 26 25 25   GLUCOSE 90 87 139* 97  BUN 21 17 16 16   CREATININE 1.54* 1.30* 1.26* 1.15  CALCIUM 9.2 8.6* 8.2* 8.1*   GFR: Estimated Creatinine Clearance: 56.2 mL/min (by C-G formula based on SCr of 1.15 mg/dL). Liver Function Tests: Recent Labs  Lab 10/14/21 1407 10/15/21 0444 10/16/21 0444 10/17/21 0446  AST 132* 108* 191* 140*  ALT 144* 109* 148* 125*  ALKPHOS 442* 396* 404* 349*  BILITOT 4.2* 4.0* 7.3* 6.4*  PROT 7.6 6.0* 6.0* 5.6*  ALBUMIN 3.1* 2.5* 2.4* 2.3*   Recent Labs  Lab 10/14/21 1407  LIPASE 39   No results for input(s): AMMONIA in the last 168 hours. Coagulation Profile: No results for input(s): INR, PROTIME in the last 168  hours. Cardiac Enzymes: No results for input(s): CKTOTAL, CKMB, CKMBINDEX, TROPONINI in the last 168 hours. BNP (last 3 results) No results for input(s): PROBNP in the last 8760 hours. HbA1C: No results for input(s): HGBA1C in the last 72 hours. CBG: No results for input(s): GLUCAP in the last 168 hours. Lipid Profile: No results for input(s): CHOL, HDL, LDLCALC, TRIG, CHOLHDL, LDLDIRECT in the last 72 hours. Thyroid Function Tests: No results for input(s): TSH, T4TOTAL, FREET4, T3FREE, THYROIDAB in the last 72 hours. Anemia Panel: No results for input(s): VITAMINB12, FOLATE, FERRITIN, TIBC, IRON, RETICCTPCT in the last 72 hours. Sepsis Labs: No results for input(s): PROCALCITON, LATICACIDVEN in the last 168 hours.  Recent Results (from the past 048 hour(s))  Helicobacter pylori special antigen     Status: None   Collection Time: 10/13/21 11:52 AM   Specimen: Blood  Result Value Ref Range Status   MICRO NUMBER: [id number]  Final   SPECIMEN QUALITY Adequate  Final   SOURCE: STOOL  Final   STATUS: FINAL  Final   RESULT:   Final    Not Detected  Antimicrobials, proton pump inhibitors, and bismuth preparations inhibit H. pylori and ingestion up to two weeks prior to testing may cause false negative results. If clinically indicated the test should be repeated on a new specimen  obtained two weeks after discontinuing treatment.     Comment: Reference Range:Not Detected  Resp Panel by RT-PCR (Flu A&B, Covid) Nasopharyngeal Swab     Status: None   Collection Time: 10/14/21  4:17 PM   Specimen: Nasopharyngeal Swab; Nasopharyngeal(NP) swabs in vial transport medium  Result Value Ref Range Status   SARS Coronavirus 2 by RT PCR NEGATIVE NEGATIVE Final    Comment: (NOTE) SARS-CoV-2 target nucleic acids are NOT DETECTED.  The SARS-CoV-2 RNA is generally detectable in upper respiratory specimens during the acute phase of infection. The lowest concentration of SARS-CoV-2 viral copies this  assay can detect is 138 copies/mL. A negative result does not preclude SARS-Cov-2 infection and should not be used as the sole basis for treatment or other patient management decisions. A negative result may occur with  improper specimen collection/handling, submission of specimen other than nasopharyngeal swab, presence of viral mutation(s) within the areas targeted by this assay, and inadequate number of viral copies(<138 copies/mL). A negative result must be combined with clinical observations, patient history, and epidemiological information. The expected result is Negative.  Fact Sheet for Patients:  EntrepreneurPulse.com.au  Fact Sheet for Healthcare Providers:  IncredibleEmployment.be  This test is no t yet approved or cleared by the [place] FDA and  has been authorized for detection and/or diagnosis of SARS-CoV-2 by FDA under an Emergency Use Authorization (EUA). This EUA will  remain  in effect (meaning this test can be used) for the duration of the COVID-19 declaration under Section 564(b)(1) of the Act, 21 U.S.C.section 360bbb-3(b)(1), unless the authorization is terminated  or revoked sooner.       Influenza A by PCR NEGATIVE NEGATIVE Final   Influenza B by PCR NEGATIVE NEGATIVE Final    Comment: (NOTE) The Xpert Xpress SARS-CoV-2/FLU/RSV plus assay is intended as an aid in the diagnosis of influenza from Nasopharyngeal swab specimens and should not be used as a sole basis for treatment. Nasal washings and aspirates are unacceptable for Xpert Xpress SARS-CoV-2/FLU/RSV testing.  Fact Sheet for Patients: EntrepreneurPulse.com.au  Fact Sheet for Healthcare Providers: IncredibleEmployment.be  This test is not yet approved or cleared by the [place] FDA and has been authorized for detection and/or diagnosis of SARS-CoV-2 by FDA under an Emergency Use Authorization (EUA). This EUA will  remain in effect (meaning this test can be used) for the duration of the COVID-19 declaration under Section 564(b)(1) of the Act, 21 U.S.C. section 360bbb-3(b)(1), unless the authorization is terminated or revoked.  Performed at [facility], [place] [address] [place], [place] [address]     Antimicrobials: Anti-infectives (From admission, onward)    None      Culture/Microbiology No results found for: SDES, SPECREQUEST, CULT, REPTSTATUS  Other culture-see note   Radiology Studies: No results found.   LOS: 1 day   Antonieta Pert, MD [facility]  10/17/2021, 10:13 AM

## 2021-10-18 DIAGNOSIS — R1010 Upper abdominal pain, unspecified: Secondary | ICD-10-CM | POA: Diagnosis not present

## 2021-10-18 DIAGNOSIS — R1011 Right upper quadrant pain: Secondary | ICD-10-CM | POA: Diagnosis not present

## 2021-10-18 DIAGNOSIS — R7989 Other specified abnormal findings of blood chemistry: Secondary | ICD-10-CM | POA: Diagnosis not present

## 2021-10-18 LAB — COMPREHENSIVE METABOLIC PANEL
ALT: 118 U/L — ABNORMAL HIGH (ref 0–44)
AST: 117 U/L — ABNORMAL HIGH (ref 15–41)
Albumin: 2.2 g/dL — ABNORMAL LOW (ref 3.5–5.0)
Alkaline Phosphatase: 306 U/L — ABNORMAL HIGH (ref 38–126)
Anion gap: 6 (ref 5–15)
BUN: 13 mg/dL (ref 8–23)
CO2: 25 mmol/L (ref 22–32)
Calcium: 7.9 mg/dL — ABNORMAL LOW (ref 8.9–10.3)
Chloride: 105 mmol/L (ref 98–111)
Creatinine, Ser: 1.27 mg/dL — ABNORMAL HIGH (ref 0.61–1.24)
GFR, Estimated: 58 mL/min — ABNORMAL LOW (ref >60)
Glucose, Bld: 99 mg/dL (ref 70–99)
Potassium: 3.4 mmol/L — ABNORMAL LOW (ref 3.5–5.1)
Sodium: 136 mmol/L (ref 135–145)
Total Bilirubin: 4 mg/dL — ABNORMAL HIGH (ref 0.3–1.2)
Total Protein: 5.6 g/dL — ABNORMAL LOW (ref 6.5–8.1)

## 2021-10-18 LAB — CBC
HCT: 29.2 % — ABNORMAL LOW (ref 39.0–52.0)
Hemoglobin: 9.6 g/dL — ABNORMAL LOW (ref 13.0–17.0)
MCH: 32.8 pg (ref 26.0–34.0)
MCHC: 32.9 g/dL (ref 30.0–36.0)
MCV: 99.7 fL (ref 80.0–100.0)
Platelets: 347 10*3/uL (ref 150–400)
RBC: 2.93 MIL/uL — ABNORMAL LOW (ref 4.22–5.81)
RDW: 15.7 % — ABNORMAL HIGH (ref 11.5–15.5)
WBC: 7.1 10*3/uL (ref 4.0–10.5)
nRBC: 0 % (ref 0.0–0.2)

## 2021-10-18 NOTE — Progress Notes (Signed)
° ° ° °  Progress Note    ASSESSMENT AND PLAN:   Choledocholithiasis.  Suspected early cholangitis, responded well to IV Zosyn.  No pancreatitis. Cholelithiasis Severe COPD  Plan: -ERCP scheduled for 2/27 @ 12 pm with Dr Carlean Purl after Plavix washout.  He is well aware of risks and benefits. -Continue IV Zosyn -Continue supportive treatment for now -Will follow peripherally over the weekend. Pl call if any ? Or change in clinical status.      SUBJECTIVE   Denies having any abdominal pain No fever or chills. No N/V    LFTs are trending down. TB 6.4 to 4.0 WBC count back to normal with Zosyn.   OBJECTIVE:     Vital signs in last 24 hours: Temp:  [97.5 F (36.4 C)-97.9 F (36.6 C)] 97.9 F (36.6 C) (02/25 1333) Pulse Rate:  [69-76] 69 (02/25 1333) Resp:  [14-18] 14 (02/25 1333) BP: (117-122)/(63-72) 119/65 (02/25 1333) SpO2:  [98 %-100 %] 99 % (02/25 1333) Last BM Date : 10/17/21 General:   Alert, short of breath even on talking. EENT:  Normal hearing,icteric sclera, conjunctive pink.  Heart:  Regular rate and rhythm; no murmur.  No lower extremity edema   Pulm: Bilateral decreased breath sounds.  No wheezing. Abdomen:  Soft, nondistended, nontender.  Normal bowel sounds,.       Neurologic:  Alert and  oriented x4;  grossly normal neurologically. Psych:  Pleasant, cooperative.  Normal mood and affect.   Intake/Output from previous day: 02/24 0701 - 02/25 0700 In: 3109.9 [P.O.:1560; I.V.:1500; IV Piggyback:49.9] Out: 700 [Urine:700] Intake/Output this shift: Total I/O In: 120 [P.O.:120] Out: 400 [Urine:400]  Lab Results: Recent Labs    10/16/21 1114 10/18/21 0417  WBC 19.6* 7.1  HGB 9.9* 9.6*  HCT 29.8* 29.2*  PLT 345 347   BMET Recent Labs    10/16/21 0444 10/17/21 0446 10/18/21 0417  NA 135 136 136  K 3.9 3.7 3.4*  CL 106 106 105  CO2 25 25 25   GLUCOSE 139* 97 99  BUN 16 16 13   CREATININE 1.26* 1.15 1.27*  CALCIUM 8.2* 8.1* 7.9*    LFT Recent Labs    10/18/21 0417  PROT 5.6*  ALBUMIN 2.2*  AST 117*  ALT 118*  ALKPHOS 306*  BILITOT 4.0*   PT/INR No results for input(s): LABPROT, INR in the last 72 hours. Hepatitis Panel No results for input(s): HEPBSAG, HCVAB, HEPAIGM, HEPBIGM in the last 72 hours.  No results found.   Principal Problem:   Pain in the abdomen Active Problems:   Chronic obstructive pulmonary disease (HCC)   CAD, multiple vessel - status post CABG: LIMA-LAD, SVG-RPDA; PCI native RCA for 100% SVG-RCA   Essential hypertension   Hypothyroidism   Peripheral arterial occlusive disease (HCC)   Hyperlipidemia with target LDL less than 70   Chronic respiratory failure with hypoxia (HCC) - SaO2 drops to 87% with ambulation   Abnormal LFTs   Acute renal failure superimposed on stage 3a chronic kidney disease ([place])   Cholelithiasis with choledocholithiasis   Anemia due to chronic kidney disease   Malnutrition of moderate degree     LOS: 2 days     Carmell Austria, MD 10/18/2021, 2:15 PM [facility] [phone]

## 2021-10-18 NOTE — Progress Notes (Signed)
PROGRESS NOTE Michael [name]  PYP:[id number] DOB: [birth year] DOA: 10/14/2021 PCP: Lorrene Reid, PA-C   Brief Narrative/Hospital Course: 76 year old gentleman with medical history of CAD multiple vessel with CABG, PCI, carotid artery occlusion, hypertension, HLD, PAD, hypertension, COPD/chronic respiratory failure on 4 L nasal cannula, gout followed by Dr. Rush Landmark for abdominal pain, with H. pylori antigen positive treated and eradicated confirmed with EGD on 07/15/2019 has had abnormal LFTs for several months and recently worsened, underwent further work-up with CT scan of the abdomen pelvis and still jaundiced and directed to the ED for admission. In the ED, hemodynamically stable, on room air, labs shows creatinine at 1.5 alk phos 442 AST 132 ALT 144 total bili 4.2-Labs on 215 over alk phos 426, AST 180 ALT 197 and TB 7.1 GGT 330. GI was consulted in the ED planning for MRCP observation admission and repeat labs in a.m. to see if he needs ERCP or not.MRCP came back with cholelithiasis and choledocholithiasis.  Patient remains hospitalized for ERCP after Plavix washout.    Subjective: Seen and examined " I am doing well" Afebrile overnight, patient WBC count today is interestingly normal , was placed on Zosyn yesterday Labs this morning with potassium 3.4 TV improved to 4.0 with AST ALT up and stable creatinine 1.2  Assessment and Plan: * Pain in the abdomen- (present on admission) Cholelithiasis and choledocholithiasis symptomatic Elevated LFTs due to choledocholithiasis Abdominal pain nausea and weight loss: MRCP-cholelithiasis and choledocholithiasis- symptomatic w/ abdominal pain nausea elevated LFTs-for ERCP Monday after plavix washout. GI following- started on zosyn due to significant leukocytosis 2/24 to prevent cholangitis.  TB appears better today AST ALT stable  Cholelithiasis with choledocholithiasis- (present on admission) See #1  Abnormal LFTs- (present on admission) See  above #1  Acute renal failure superimposed on stage 3a chronic kidney disease ([place]) Suspect patient presented with AKI due to his GI disease nausea vomiting.  Likely baseline creatinine around 1.1 with CKD stage II vs CKD3a given creatinine has improved to 1.1-1.2 from 1.5 on presentation with IV fluids.cont ivf, po diet. Recent Labs  Lab 10/14/21 1407 10/15/21 0444 10/16/21 0444 10/17/21 0446 10/18/21 0417  BUN 21 17 16 16 13   CREATININE 1.54* 1.30* 1.26* 1.15 1.27*    Chronic respiratory failure with hypoxia (HCC) - SaO2 drops to 87% with ambulation- (present on admission) See copd.  Chronic obstructive pulmonary disease (HCC)- (present on admission) COPD Chronic hypoxic respiratory failure on 4 L: COPD compensated continue home oxygen 4 L and inhalers.    CAD, multiple vessel - status post CABG: LIMA-LAD, SVG-RPDA; PCI native RCA for 100% SVG-RCA- (present on admission) CAD with history of CABG and stent HLD PVD History of carotid stenosis: No chest pain.Continue metoprolol but holding Plavix and statin.    Essential hypertension- (present on admission) BP controlled on home metoprolol  Hyperlipidemia with target LDL less than 70- (present on admission) Holding statin due to abnormal LFTs  Peripheral arterial occlusive disease ([place])- (present on admission) See above.  Choledocholithiasis See #1  Malnutrition of moderate degree- (present on admission) Dietitian on board, augment diet as below Nutrition Problem: Moderate Malnutrition Etiology: acute illness (cholelithiasis) Signs/Symptoms: mild fat depletion, mild muscle depletion Interventions: Education    Anemia due to chronic kidney disease Hemoglobin is stable.monitor.   Hypothyroidism- (present on admission) Continue Synthroid   DVT prophylaxis: SCDs Start: 10/14/21 1628 off  Lovenox for ercp Code Status:   Code Status: Full Code Family Communication: plan of care discussed with patient/ at  bedside.  Disposition: Currently not medically stable for discharge. Status is: inpatient The patient remains hospitalized for the need for ERCP coming Monday  Objective: Vitals last 24 hrs: Vitals:   10/17/21 0441 10/17/21 1316 10/17/21 2050 10/18/21 0538  BP: 98/82 111/66 117/72 122/63  Pulse: 68 71 76 70  Resp: 18 16 18 18   Temp: 97.9 F (36.6 C) 98.1 F (36.7 C) (!) 97.5 F (36.4 C) 97.7 F (36.5 C)  TempSrc: Oral Oral Oral Oral  SpO2: 99% 99% 98% 100%  Weight:      Height:       Weight change:   Physical Examination: General exam: Aa0x3, pleasant, older than stated age, weak appearing. HEENT:Oral mucosa moist, Ear/Nose WNL grossly, dentition normal. Respiratory system: bilaterally diminished, no use of accessory muscle Cardiovascular system: S1 & S2 +, No JVD,. Gastrointestinal system: Abdomen soft,NT,ND,BS+ Nervous System:Alert, awake, moving extremities and grossly nonfocal Extremities: LE ankle edema none, distal peripheral pulses palpable.  Skin: No rashes,no icterus. MSK: Normal muscle bulk,tone, power   Medications reviewed:  Scheduled Meds:  fenofibrate  54 mg Oral Daily   levothyroxine  75 mcg Oral Q0600   metoprolol tartrate  25 mg Oral BID   pantoprazole  40 mg Oral Daily   Continuous Infusions:  lactated ringers 75 mL/hr at 10/17/21 1337   piperacillin-tazobactam (ZOSYN)  IV 3.375 g (10/18/21 0618)      Diet Order             Diet NPO time specified  Diet effective midnight           Diet regular Room service appropriate? Yes; Fluid consistency: Thin  Diet effective now                    Nutrition Problem: Moderate Malnutrition Etiology: acute illness (cholelithiasis) Signs/Symptoms: mild fat depletion, mild muscle depletion Interventions: Education   Intake/Output Summary (Last 24 hours) at 10/18/2021 0958 Last data filed at 10/18/2021 0600 Gross per 24 hour  Intake 2989.94 ml  Output 400 ml  Net 2589.94 ml   Net IO Since  Admission: 4,414.94 mL [10/18/21 0958]  Wt Readings from Last 3 Encounters:  10/14/21 85.7 kg  07/14/21 90.5 kg  06/17/21 90.5 kg     Unresulted Labs (From admission, onward)     Start     Ordered   10/18/21 0500  CBC  Daily,   R      10/17/21 1014          Data Reviewed: I have personally reviewed following labs and imaging studies CBC: Recent Labs  Lab 10/14/21 1407 10/15/21 0444 10/16/21 1114 10/18/21 0417  WBC 7.8 7.5 19.6* 7.1  NEUTROABS 5.6  --  17.3*  --   HGB 12.2* 10.0* 9.9* 9.6*  HCT 37.1* 31.7* 29.8* 29.2*  MCV 98.1 101.6* 99.3 99.7  PLT 438* 361 345 594   Basic Metabolic Panel: Recent Labs  Lab 10/14/21 1407 10/15/21 0444 10/16/21 0444 10/17/21 0446 10/18/21 0417  NA 136 136 135 136 136  K 4.2 3.7 3.9 3.7 3.4*  CL 102 105 106 106 105  CO2 26 26 25 25 25   GLUCOSE 90 87 139* 97 99  BUN 21 17 16 16 13   CREATININE 1.54* 1.30* 1.26* 1.15 1.27*  CALCIUM 9.2 8.6* 8.2* 8.1* 7.9*   GFR: Estimated Creatinine Clearance: 50.9 mL/min (A) (by C-G formula based on SCr of 1.27 mg/dL (H)). Liver Function Tests: Recent Labs  Lab 10/14/21 1407 10/15/21 0444 10/16/21 0444 10/17/21  0446 10/18/21 0417  AST 132* 108* 191* 140* 117*  ALT 144* 109* 148* 125* 118*  ALKPHOS 442* 396* 404* 349* 306*  BILITOT 4.2* 4.0* 7.3* 6.4* 4.0*  PROT 7.6 6.0* 6.0* 5.6* 5.6*  ALBUMIN 3.1* 2.5* 2.4* 2.3* 2.2*   Recent Labs  Lab 10/14/21 1407  LIPASE 39   No results for input(s): AMMONIA in the last 168 hours. Coagulation Profile: No results for input(s): INR, PROTIME in the last 168 hours. Cardiac Enzymes: No results for input(s): CKTOTAL, CKMB, CKMBINDEX, TROPONINI in the last 168 hours. BNP (last 3 results) No results for input(s): PROBNP in the last 8760 hours. HbA1C: No results for input(s): HGBA1C in the last 72 hours. CBG: No results for input(s): GLUCAP in the last 168 hours. Lipid Profile: No results for input(s): CHOL, HDL, LDLCALC, TRIG, CHOLHDL,  LDLDIRECT in the last 72 hours. Thyroid Function Tests: No results for input(s): TSH, T4TOTAL, FREET4, T3FREE, THYROIDAB in the last 72 hours. Anemia Panel: No results for input(s): VITAMINB12, FOLATE, FERRITIN, TIBC, IRON, RETICCTPCT in the last 72 hours. Sepsis Labs: No results for input(s): PROCALCITON, LATICACIDVEN in the last 168 hours.  Recent Results (from the past 785 hour(s))  Helicobacter pylori special antigen     Status: None   Collection Time: 10/13/21 11:52 AM   Specimen: Blood  Result Value Ref Range Status   MICRO NUMBER: [id number]  Final   SPECIMEN QUALITY Adequate  Final   SOURCE: STOOL  Final   STATUS: FINAL  Final   RESULT:   Final    Not Detected  Antimicrobials, proton pump inhibitors, and bismuth preparations inhibit H. pylori and ingestion up to two weeks prior to testing may cause false negative results. If clinically indicated the test should be repeated on a new specimen  obtained two weeks after discontinuing treatment.     Comment: Reference Range:Not Detected  Resp Panel by RT-PCR (Flu A&B, Covid) Nasopharyngeal Swab     Status: None   Collection Time: 10/14/21  4:17 PM   Specimen: Nasopharyngeal Swab; Nasopharyngeal(NP) swabs in vial transport medium  Result Value Ref Range Status   SARS Coronavirus 2 by RT PCR NEGATIVE NEGATIVE Final    Comment: (NOTE) SARS-CoV-2 target nucleic acids are NOT DETECTED.  The SARS-CoV-2 RNA is generally detectable in upper respiratory specimens during the acute phase of infection. The lowest concentration of SARS-CoV-2 viral copies this assay can detect is 138 copies/mL. A negative result does not preclude SARS-Cov-2 infection and should not be used as the sole basis for treatment or other patient management decisions. A negative result may occur with  improper specimen collection/handling, submission of specimen other than nasopharyngeal swab, presence of viral mutation(s) within the areas targeted by this assay, and  inadequate number of viral copies(<138 copies/mL). A negative result must be combined with clinical observations, patient history, and epidemiological information. The expected result is Negative.  Fact Sheet for Patients:  EntrepreneurPulse.com.au  Fact Sheet for Healthcare Providers:  IncredibleEmployment.be  This test is no t yet approved or cleared by the [place] FDA and  has been authorized for detection and/or diagnosis of SARS-CoV-2 by FDA under an Emergency Use Authorization (EUA). This EUA will remain  in effect (meaning this test can be used) for the duration of the COVID-19 declaration under Section 564(b)(1) of the Act, 21 U.S.C.section 360bbb-3(b)(1), unless the authorization is terminated  or revoked sooner.       Influenza A by PCR NEGATIVE NEGATIVE Final   Influenza B  by PCR NEGATIVE NEGATIVE Final    Comment: (NOTE) The Xpert Xpress SARS-CoV-2/FLU/RSV plus assay is intended as an aid in the diagnosis of influenza from Nasopharyngeal swab specimens and should not be used as a sole basis for treatment. Nasal washings and aspirates are unacceptable for Xpert Xpress SARS-CoV-2/FLU/RSV testing.  Fact Sheet for Patients: EntrepreneurPulse.com.au  Fact Sheet for Healthcare Providers: IncredibleEmployment.be  This test is not yet approved or cleared by the [place] FDA and has been authorized for detection and/or diagnosis of SARS-CoV-2 by FDA under an Emergency Use Authorization (EUA). This EUA will remain in effect (meaning this test can be used) for the duration of the COVID-19 declaration under Section 564(b)(1) of the Act, 21 U.S.C. section 360bbb-3(b)(1), unless the authorization is terminated or revoked.  Performed at [facility], [place] [address], [place], [place] [address]     Antimicrobials: Anti-infectives (From admission, onward)    Start      Dose/Rate Route Frequency Ordered Stop   10/17/21 1400  piperacillin-tazobactam (ZOSYN) IVPB 3.375 g        3.375 g 12.5 mL/hr over 240 Minutes Intravenous Every 8 hours 10/17/21 1022 10/20/21 2159      Culture/Microbiology No results found for: SDES, SPECREQUEST, CULT, REPTSTATUS  Other culture-see note   Radiology Studies: No results found.   LOS: 2 days   Antonieta Pert, MD [facility]  10/18/2021, 9:58 AM

## 2021-10-19 DIAGNOSIS — Z7901 Long term (current) use of anticoagulants: Secondary | ICD-10-CM

## 2021-10-19 DIAGNOSIS — K831 Obstruction of bile duct: Secondary | ICD-10-CM | POA: Diagnosis not present

## 2021-10-19 DIAGNOSIS — K8070 Calculus of gallbladder and bile duct without cholecystitis without obstruction: Secondary | ICD-10-CM | POA: Diagnosis not present

## 2021-10-19 LAB — COMPREHENSIVE METABOLIC PANEL
ALT: 113 U/L — ABNORMAL HIGH (ref 0–44)
AST: 124 U/L — ABNORMAL HIGH (ref 15–41)
Albumin: 2 g/dL — ABNORMAL LOW (ref 3.5–5.0)
Alkaline Phosphatase: 309 U/L — ABNORMAL HIGH (ref 38–126)
Anion gap: 5 (ref 5–15)
BUN: 12 mg/dL (ref 8–23)
CO2: 26 mmol/L (ref 22–32)
Calcium: 8 mg/dL — ABNORMAL LOW (ref 8.9–10.3)
Chloride: 106 mmol/L (ref 98–111)
Creatinine, Ser: 1.24 mg/dL (ref 0.61–1.24)
GFR, Estimated: 60 mL/min — ABNORMAL LOW (ref >60)
Glucose, Bld: 111 mg/dL — ABNORMAL HIGH (ref 70–99)
Potassium: 3.8 mmol/L (ref 3.5–5.1)
Sodium: 137 mmol/L (ref 135–145)
Total Bilirubin: 3.6 mg/dL — ABNORMAL HIGH (ref 0.3–1.2)
Total Protein: 5.6 g/dL — ABNORMAL LOW (ref 6.5–8.1)

## 2021-10-19 LAB — CBC
HCT: 28.2 % — ABNORMAL LOW (ref 39.0–52.0)
Hemoglobin: 9.2 g/dL — ABNORMAL LOW (ref 13.0–17.0)
MCH: 32.2 pg (ref 26.0–34.0)
MCHC: 32.6 g/dL (ref 30.0–36.0)
MCV: 98.6 fL (ref 80.0–100.0)
Platelets: 354 10*3/uL (ref 150–400)
RBC: 2.86 MIL/uL — ABNORMAL LOW (ref 4.22–5.81)
RDW: 15.9 % — ABNORMAL HIGH (ref 11.5–15.5)
WBC: 7.3 10*3/uL (ref 4.0–10.5)
nRBC: 0 % (ref 0.0–0.2)

## 2021-10-19 NOTE — H&P (View-Only) (Signed)
[facility] Progress Note  Chief Complaint: Choledocholithiasis and jaundice  History:  No clinical changes since seen by GI yesterday. Denies abdominal pain, no reported fever. Denies chest pain or dyspnea.   Objective:   Current Facility-Administered Medications:    acetaminophen (TYLENOL) tablet 650 mg, 650 mg, Oral, Q6H PRN, 650 mg at 10/15/21 1623 **OR** acetaminophen (TYLENOL) suppository 650 mg, 650 mg, Rectal, Q6H PRN, Kc, Ramesh, MD   fenofibrate tablet 54 mg, 54 mg, Oral, Daily, Kc, Ramesh, MD, 54 mg at 10/19/21 [id number]   lactated ringers infusion, , Intravenous, Continuous, Kc, Ramesh, MD, Last Rate: 75 mL/hr at 10/18/21 1948, New Bag at 10/18/21 1948   levothyroxine (SYNTHROID) tablet 75 mcg, 75 mcg, Oral, Q0600, Kc, Maren Beach, MD, 75 mcg at 10/19/21 0527   metoprolol tartrate (LOPRESSOR) tablet 25 mg, 25 mg, Oral, BID, Kc, Ramesh, MD, 25 mg at 10/19/21 0918   nitroGLYCERIN (NITROSTAT) SL tablet 0.4 mg, 0.4 mg, Sublingual, Q5 min PRN, Kc, Ramesh, MD   ondansetron (ZOFRAN) tablet 4 mg, 4 mg, Oral, Q6H PRN, 4 mg at 10/15/21 2011 **OR** ondansetron (ZOFRAN) injection 4 mg, 4 mg, Intravenous, Q6H PRN, Kc, Ramesh, MD   oxyCODONE (Oxy IR/ROXICODONE) immediate release tablet 5 mg, 5 mg, Oral, Q12H PRN, Kc, Ramesh, MD, 5 mg at 10/15/21 1641   pantoprazole (PROTONIX) EC tablet 40 mg, 40 mg, Oral, Daily, Esterwood, Amy S, PA-C, 40 mg at 10/19/21 0917   piperacillin-tazobactam (ZOSYN) IVPB 3.375 g, 3.375 g, Intravenous, Q8H, Esterwood, Amy S, PA-C, Last Rate: 12.5 mL/hr at 10/19/21 0528, 3.375 g at 10/19/21 0528   traMADol (ULTRAM) tablet 50 mg, 50 mg, Oral, Q6H PRN, Esterwood, Amy S, PA-C   lactated ringers 75 mL/hr at 10/18/21 1948   piperacillin-tazobactam (ZOSYN)  IV 3.375 g (10/19/21 0528)     Vital signs in last 24 hrs: Vitals:   10/18/21 2050 10/19/21 0501  BP: 116/65 (!) 116/59  Pulse: 61 66  Resp: 15 15  Temp: 98.1 F (36.7 C) 98.1 F (36.7 C)  SpO2: 100% 100%     Intake/Output Summary (Last 24 hours) at 10/19/2021 0941 Last data filed at 10/19/2021 0914 Gross per 24 hour  Intake 3356.94 ml  Output 1400 ml  Net 1956.94 ml     Physical Exam  HEENT: sclera icteric, oral mucosa without lesions Neck: supple, no thyromegaly, JVD or lymphadenopathy Cardiac: RRR without murmurs, S1S2 heard, no peripheral edema Pulm: clear to auscultation bilaterally, normal RR and effort noted Abdomen: soft, no tenderness, with active bowel sounds. No guarding or palpable hepatosplenomegaly Skin; warm and dry, + jaundice  Recent Labs:  CBC Latest Ref Rng & Units 10/19/2021 10/18/2021 10/16/2021  WBC 4.0 - 10.5 K/uL 7.3 7.1 19.6(H)  Hemoglobin 13.0 - 17.0 g/dL 9.2(L) 9.6(L) 9.9(L)  Hematocrit 39.0 - 52.0 % 28.2(L) 29.2(L) 29.8(L)  Platelets 150 - 400 K/uL 354 347 345    No results for input(s): INR in the last 168 hours. CMP Latest Ref Rng & Units 10/19/2021 10/18/2021 10/17/2021  Glucose 70 - 99 mg/dL 111(H) 99 97  BUN 8 - 23 mg/dL 12 13 16   Creatinine 0.61 - 1.24 mg/dL 1.24 1.27(H) 1.15  Sodium 135 - 145 mmol/L 137 136 136  Potassium 3.5 - 5.1 mmol/L 3.8 3.4(L) 3.7  Chloride 98 - 111 mmol/L 106 105 106  CO2 22 - 32 mmol/L 26 25 25   Calcium 8.9 - 10.3 mg/dL 8.0(L) 7.9(L) 8.1(L)  Total Protein 6.5 - 8.1 g/dL 5.6(L) 5.6(L) 5.6(L)  Total Bilirubin 0.3 -  1.2 mg/dL 3.6(H) 4.0(H) 6.4(H)  Alkaline Phos 38 - 126 U/L 309(H) 306(H) 349(H)  AST 15 - 41 U/L 124(H) 117(H) 140(H)  ALT 0 - 44 U/L 113(H) 118(H) 125(H)     Radiologic studies:   Assessment & Plan  Assessment: Choledocholithiasis causing recurrent upper abdominal pain and now jaundice. 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  He is on Zosyn for a leukocytosis that developed 2 days ago.    Plan: ERCP tomorrow with Dr.  Silvano Rusk. Continue antibiotics until then. Continue regular diet, then n.p.o. after midnight.  Mr. Reisinger has decided that he will have ice cream and pie for dinner since he does not want to be too full going into his procedure tomorrow.  Call GI sooner if needed. Nelida Meuse III Office: [phone]

## 2021-10-19 NOTE — Progress Notes (Signed)
[facility] Progress Note  Chief Complaint: Choledocholithiasis and jaundice  History:  No clinical changes since seen by GI yesterday. Denies abdominal pain, no reported fever. Denies chest pain or dyspnea.   Objective:   Current Facility-Administered Medications:    acetaminophen (TYLENOL) tablet 650 mg, 650 mg, Oral, Q6H PRN, 650 mg at 10/15/21 1623 **OR** acetaminophen (TYLENOL) suppository 650 mg, 650 mg, Rectal, Q6H PRN, Kc, Ramesh, MD   fenofibrate tablet 54 mg, 54 mg, Oral, Daily, Kc, Ramesh, MD, 54 mg at 10/19/21 [id number]   lactated ringers infusion, , Intravenous, Continuous, Kc, Ramesh, MD, Last Rate: 75 mL/hr at 10/18/21 1948, New Bag at 10/18/21 1948   levothyroxine (SYNTHROID) tablet 75 mcg, 75 mcg, Oral, Q0600, Kc, Maren Beach, MD, 75 mcg at 10/19/21 0527   metoprolol tartrate (LOPRESSOR) tablet 25 mg, 25 mg, Oral, BID, Kc, Ramesh, MD, 25 mg at 10/19/21 0918   nitroGLYCERIN (NITROSTAT) SL tablet 0.4 mg, 0.4 mg, Sublingual, Q5 min PRN, Kc, Ramesh, MD   ondansetron (ZOFRAN) tablet 4 mg, 4 mg, Oral, Q6H PRN, 4 mg at 10/15/21 2011 **OR** ondansetron (ZOFRAN) injection 4 mg, 4 mg, Intravenous, Q6H PRN, Kc, Ramesh, MD   oxyCODONE (Oxy IR/ROXICODONE) immediate release tablet 5 mg, 5 mg, Oral, Q12H PRN, Kc, Ramesh, MD, 5 mg at 10/15/21 1641   pantoprazole (PROTONIX) EC tablet 40 mg, 40 mg, Oral, Daily, Esterwood, Amy S, PA-C, 40 mg at 10/19/21 0917   piperacillin-tazobactam (ZOSYN) IVPB 3.375 g, 3.375 g, Intravenous, Q8H, Esterwood, Amy S, PA-C, Last Rate: 12.5 mL/hr at 10/19/21 0528, 3.375 g at 10/19/21 0528   traMADol (ULTRAM) tablet 50 mg, 50 mg, Oral, Q6H PRN, Esterwood, Amy S, PA-C   lactated ringers 75 mL/hr at 10/18/21 1948   piperacillin-tazobactam (ZOSYN)  IV 3.375 g (10/19/21 0528)     Vital signs in last 24 hrs: Vitals:   10/18/21 2050 10/19/21 0501  BP: 116/65 (!) 116/59  Pulse: 61 66  Resp: 15 15  Temp: 98.1 F (36.7 C) 98.1 F (36.7 C)  SpO2: 100% 100%     Intake/Output Summary (Last 24 hours) at 10/19/2021 0941 Last data filed at 10/19/2021 0914 Gross per 24 hour  Intake 3356.94 ml  Output 1400 ml  Net 1956.94 ml     Physical Exam  HEENT: sclera icteric, oral mucosa without lesions Neck: supple, no thyromegaly, JVD or lymphadenopathy Cardiac: RRR without murmurs, S1S2 heard, no peripheral edema Pulm: clear to auscultation bilaterally, normal RR and effort noted Abdomen: soft, no tenderness, with active bowel sounds. No guarding or palpable hepatosplenomegaly Skin; warm and dry, + jaundice  Recent Labs:  CBC Latest Ref Rng & Units 10/19/2021 10/18/2021 10/16/2021  WBC 4.0 - 10.5 K/uL 7.3 7.1 19.6(H)  Hemoglobin 13.0 - 17.0 g/dL 9.2(L) 9.6(L) 9.9(L)  Hematocrit 39.0 - 52.0 % 28.2(L) 29.2(L) 29.8(L)  Platelets 150 - 400 K/uL 354 347 345    No results for input(s): INR in the last 168 hours. CMP Latest Ref Rng & Units 10/19/2021 10/18/2021 10/17/2021  Glucose 70 - 99 mg/dL 111(H) 99 97  BUN 8 - 23 mg/dL 12 13 16   Creatinine 0.61 - 1.24 mg/dL 1.24 1.27(H) 1.15  Sodium 135 - 145 mmol/L 137 136 136  Potassium 3.5 - 5.1 mmol/L 3.8 3.4(L) 3.7  Chloride 98 - 111 mmol/L 106 105 106  CO2 22 - 32 mmol/L 26 25 25   Calcium 8.9 - 10.3 mg/dL 8.0(L) 7.9(L) 8.1(L)  Total Protein 6.5 - 8.1 g/dL 5.6(L) 5.6(L) 5.6(L)  Total Bilirubin 0.3 -  1.2 mg/dL 3.6(H) 4.0(H) 6.4(H)  Alkaline Phos 38 - 126 U/L 309(H) 306(H) 349(H)  AST 15 - 41 U/L 124(H) 117(H) 140(H)  ALT 0 - 44 U/L 113(H) 118(H) 125(H)     Radiologic studies:   Assessment & Plan  Assessment: Choledocholithiasis causing recurrent upper abdominal pain and now jaundice. 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  He is on Zosyn for a leukocytosis that developed 2 days ago.    Plan: ERCP tomorrow with Dr.  Silvano Rusk. Continue antibiotics until then. Continue regular diet, then n.p.o. after midnight.  Mr. Plotnick has decided that he will have ice cream and pie for dinner since he does not want to be too full going into his procedure tomorrow.  Call GI sooner if needed. Nelida Meuse III Office: [phone]

## 2021-10-19 NOTE — Progress Notes (Signed)
PROGRESS NOTE Michael [name]  GNO:[id number] DOB: 07/01/1946 DOA: 10/14/2021 PCP: Lorrene Reid, PA-C   Brief Narrative/Hospital Course: 76 year old gentleman with medical history of CAD multiple vessel with CABG, PCI, carotid artery occlusion, hypertension, HLD, PAD, hypertension, COPD/chronic respiratory failure on 4 L nasal cannula, gout followed by Dr. Rush Landmark for abdominal pain, with H. pylori antigen positive treated and eradicated confirmed with EGD on 07/15/2019 has had abnormal LFTs for several months and recently worsened, underwent further work-up with CT scan of the abdomen pelvis and still jaundiced and directed to the ED for admission. In the ED, hemodynamically stable, on room air, labs shows creatinine at 1.5 alk phos 442 AST 132 ALT 144 total bili 4.2-Labs on 215 over alk phos 426, AST 180 ALT 197 and TB 7.1 GGT 330. GI was consulted in the ED planning for MRCP observation admission and repeat labs in a.m. to see if he needs ERCP or not.MRCP came back with cholelithiasis and choledocholithiasis.  Patient remains hospitalized for ERCP after Plavix washout.    Subjective: Seen and examined this morning resting comfortably-no complaints Overnight no fever.  Total bili downtrending.    Assessment and Plan: * Pain in the abdomen- (present on admission) Cholelithiasis and choledocholithiasis symptomatic Elevated LFTs due to choledocholithiasis Abdominal pain nausea and weight loss: MRCP-cholelithiasis and choledocholithiasis- symptomatic w/ abdominal pain nausea elevated LFTs-for ERCP 1/27 after plavix washout. GI following- started on zosyn due to significant leukocytosis 2/24 to prevent cholangitis.TB downtrending AST ALT elevated, continue to trend.  Leukocytosis resolved  Cholelithiasis with choledocholithiasis- (present on admission) See #1  Abnormal LFTs- (present on admission) See above #1  Acute renal failure superimposed on stage 3a chronic kidney disease ([place]) Suspect  patient presented with AKI due to his GI disease nausea vomiting.  Likely baseline creatinine around 1.1-1.2 with CKD stage II vs CKD3a given creatinine has improved to 1.1-1.2 from 1.5 on presentation with IV fluids. Cont gentle IV fluids, monitor labs. Recent Labs  Lab 10/15/21 0444 10/16/21 0444 10/17/21 0446 10/18/21 0417 10/19/21 0502  BUN 17 16 16 13 12   CREATININE 1.30* 1.26* 1.15 1.27* 1.24    Chronic respiratory failure with hypoxia (HCC) - SaO2 drops to 87% with ambulation- (present on admission) See copd.  Chronic obstructive pulmonary disease (HCC)- (present on admission) COPD Chronic hypoxic respiratory failure on 4 L: COPD compensated continue home oxygen 4 L and inhalers.  Per PCP he declined seeing pulmonary for PFTs.    CAD, multiple vessel - status post CABG: LIMA-LAD, SVG-RPDA; PCI native RCA for 100% SVG-RCA- (present on admission) CAD with history of CABG and stent HLD PVD History of carotid stenosis: Currently no angina or chest pain.Continue metoprolol but holding Plavix and statin.    Essential hypertension- (present on admission) BP well controlled on metoprolol  Hyperlipidemia with target LDL less than 70- (present on admission) Holding statin due to abnormal LFTs  Peripheral arterial occlusive disease ([place])- (present on admission) See above.  Choledocholithiasis See #1  Malnutrition of moderate degree- (present on admission) Dietitian on board, augment diet as below Nutrition Problem: Moderate Malnutrition Etiology: acute illness (cholelithiasis) Signs/Symptoms: mild fat depletion, mild muscle depletion Interventions: Education    Anemia due to chronic kidney disease Stable 9-10 gms. monitor.   Hypothyroidism- (present on admission) Continue Synthroid   DVT prophylaxis: SCDs Start: 10/14/21 1628 off  Lovenox for ercp Code Status:   Code Status: Full Code Family Communication: plan of care discussed with patient/ at  bedside.  Disposition: Currently not medically stable  for discharge. Status is: inpatient The patient remains hospitalized for the need for ERCP coming Monday  Objective: Vitals last 24 hrs: Vitals:   10/18/21 0538 10/18/21 1333 10/18/21 2050 10/19/21 0501  BP: 122/63 119/65 116/65 (!) 116/59  Pulse: 70 69 61 66  Resp: 18 14 15 15   Temp: 97.7 F (36.5 C) 97.9 F (36.6 C) 98.1 F (36.7 C) 98.1 F (36.7 C)  TempSrc: Oral Oral Oral Oral  SpO2: 100% 99% 100% 100%  Weight:      Height:       Weight change:   Physical Examination: General exam: AAx3,older than stated age, weak appearing. HEENT:Oral mucosa moist, Ear/Nose WNL grossly, dentition normal. Respiratory system: bilaterally diminished,no use of accessory muscle Cardiovascular system: S1 & S2 +, No JVD,. Gastrointestinal system: Abdomen soft,NT,ND, BS+ Nervous System:Alert, awake, moving extremities and grossly nonfocal Extremities: edema neg,distal peripheral pulses palpable.  Skin: No rashes,no icterus. MSK: Normal muscle bulk,tone, power   Medications reviewed:  Scheduled Meds:  fenofibrate  54 mg Oral Daily   levothyroxine  75 mcg Oral Q0600   metoprolol tartrate  25 mg Oral BID   pantoprazole  40 mg Oral Daily   Continuous Infusions:  lactated ringers 75 mL/hr at 10/18/21 1948   piperacillin-tazobactam (ZOSYN)  IV 3.375 g (10/19/21 0528)      Diet Order             Diet NPO time specified  Diet effective midnight           Diet regular Room service appropriate? Yes; Fluid consistency: Thin  Diet effective now                    Nutrition Problem: Moderate Malnutrition Etiology: acute illness (cholelithiasis) Signs/Symptoms: mild fat depletion, mild muscle depletion Interventions: Education   Intake/Output Summary (Last 24 hours) at 10/19/2021 0805 Last data filed at 10/19/2021 0600 Gross per 24 hour  Intake 3236.94 ml  Output 1100 ml  Net 2136.94 ml   Net IO Since Admission: 6,551.88 mL  [10/19/21 0805]  Wt Readings from Last 3 Encounters:  10/14/21 85.7 kg  07/14/21 90.5 kg  06/17/21 90.5 kg     Unresulted Labs (From admission, onward)     Start     Ordered   10/19/21 0500  Comprehensive metabolic panel  Daily,   R      10/18/21 0959   10/18/21 0500  CBC  Daily,   R      10/17/21 1014          Data Reviewed: I have personally reviewed following labs and imaging studies CBC: Recent Labs  Lab 10/14/21 1407 10/15/21 0444 10/16/21 1114 10/18/21 0417 10/19/21 0502  WBC 7.8 7.5 19.6* 7.1 7.3  NEUTROABS 5.6  --  17.3*  --   --   HGB 12.2* 10.0* 9.9* 9.6* 9.2*  HCT 37.1* 31.7* 29.8* 29.2* 28.2*  MCV 98.1 101.6* 99.3 99.7 98.6  PLT 438* 361 345 347 409   Basic Metabolic Panel: Recent Labs  Lab 10/15/21 0444 10/16/21 0444 10/17/21 0446 10/18/21 0417 10/19/21 0502  NA 136 135 136 136 137  K 3.7 3.9 3.7 3.4* 3.8  CL 105 106 106 105 106  CO2 26 25 25 25 26   GLUCOSE 87 139* 97 99 111*  BUN 17 16 16 13 12   CREATININE 1.30* 1.26* 1.15 1.27* 1.24  CALCIUM 8.6* 8.2* 8.1* 7.9* 8.0*   GFR: Estimated Creatinine Clearance: 52.1 mL/min (by C-G formula based on  SCr of 1.24 mg/dL). Liver Function Tests: Recent Labs  Lab 10/15/21 0444 10/16/21 0444 10/17/21 0446 10/18/21 0417 10/19/21 0502  AST 108* 191* 140* 117* 124*  ALT 109* 148* 125* 118* 113*  ALKPHOS 396* 404* 349* 306* 309*  BILITOT 4.0* 7.3* 6.4* 4.0* 3.6*  PROT 6.0* 6.0* 5.6* 5.6* 5.6*  ALBUMIN 2.5* 2.4* 2.3* 2.2* 2.0*   Recent Labs  Lab 10/14/21 1407  LIPASE 39   No results for input(s): AMMONIA in the last 168 hours. Coagulation Profile: No results for input(s): INR, PROTIME in the last 168 hours. Cardiac Enzymes: No results for input(s): CKTOTAL, CKMB, CKMBINDEX, TROPONINI in the last 168 hours. BNP (last 3 results) No results for input(s): PROBNP in the last 8760 hours. HbA1C: No results for input(s): HGBA1C in the last 72 hours. CBG: No results for input(s): GLUCAP in the last  168 hours. Lipid Profile: No results for input(s): CHOL, HDL, LDLCALC, TRIG, CHOLHDL, LDLDIRECT in the last 72 hours. Thyroid Function Tests: No results for input(s): TSH, T4TOTAL, FREET4, T3FREE, THYROIDAB in the last 72 hours. Anemia Panel: No results for input(s): VITAMINB12, FOLATE, FERRITIN, TIBC, IRON, RETICCTPCT in the last 72 hours. Sepsis Labs: No results for input(s): PROCALCITON, LATICACIDVEN in the last 168 hours.  Recent Results (from the past 831 hour(s))  Helicobacter pylori special antigen     Status: None   Collection Time: 10/13/21 11:52 AM   Specimen: Blood  Result Value Ref Range Status   MICRO NUMBER: [id number]  Final   SPECIMEN QUALITY Adequate  Final   SOURCE: STOOL  Final   STATUS: FINAL  Final   RESULT:   Final    Not Detected  Antimicrobials, proton pump inhibitors, and bismuth preparations inhibit H. pylori and ingestion up to two weeks prior to testing may cause false negative results. If clinically indicated the test should be repeated on a new specimen  obtained two weeks after discontinuing treatment.     Comment: Reference Range:Not Detected  Resp Panel by RT-PCR (Flu A&B, Covid) Nasopharyngeal Swab     Status: None   Collection Time: 10/14/21  4:17 PM   Specimen: Nasopharyngeal Swab; Nasopharyngeal(NP) swabs in vial transport medium  Result Value Ref Range Status   SARS Coronavirus 2 by RT PCR NEGATIVE NEGATIVE Final    Comment: (NOTE) SARS-CoV-2 target nucleic acids are NOT DETECTED.  The SARS-CoV-2 RNA is generally detectable in upper respiratory specimens during the acute phase of infection. The lowest concentration of SARS-CoV-2 viral copies this assay can detect is 138 copies/mL. A negative result does not preclude SARS-Cov-2 infection and should not be used as the sole basis for treatment or other patient management decisions. A negative result may occur with  improper specimen collection/handling, submission of specimen other than  nasopharyngeal swab, presence of viral mutation(s) within the areas targeted by this assay, and inadequate number of viral copies(<138 copies/mL). A negative result must be combined with clinical observations, patient history, and epidemiological information. The expected result is Negative.  Fact Sheet for Patients:  EntrepreneurPulse.com.au  Fact Sheet for Healthcare Providers:  IncredibleEmployment.be  This test is no t yet approved or cleared by the [place] FDA and  has been authorized for detection and/or diagnosis of SARS-CoV-2 by FDA under an Emergency Use Authorization (EUA). This EUA will remain  in effect (meaning this test can be used) for the duration of the COVID-19 declaration under Section 564(b)(1) of the Act, 21 U.S.C.section 360bbb-3(b)(1), unless the authorization is terminated  or revoked  sooner.       Influenza A by PCR NEGATIVE NEGATIVE Final   Influenza B by PCR NEGATIVE NEGATIVE Final    Comment: (NOTE) The Xpert Xpress SARS-CoV-2/FLU/RSV plus assay is intended as an aid in the diagnosis of influenza from Nasopharyngeal swab specimens and should not be used as a sole basis for treatment. Nasal washings and aspirates are unacceptable for Xpert Xpress SARS-CoV-2/FLU/RSV testing.  Fact Sheet for Patients: EntrepreneurPulse.com.au  Fact Sheet for Healthcare Providers: IncredibleEmployment.be  This test is not yet approved or cleared by the [place] FDA and has been authorized for detection and/or diagnosis of SARS-CoV-2 by FDA under an Emergency Use Authorization (EUA). This EUA will remain in effect (meaning this test can be used) for the duration of the COVID-19 declaration under Section 564(b)(1) of the Act, 21 U.S.C. section 360bbb-3(b)(1), unless the authorization is terminated or revoked.  Performed at [facility], [place] [address], [place], [place] [address]     Antimicrobials: Anti-infectives (From admission, onward)    Start     Dose/Rate Route Frequency Ordered Stop   10/17/21 1400  piperacillin-tazobactam (ZOSYN) IVPB 3.375 g        3.375 g 12.5 mL/hr over 240 Minutes Intravenous Every 8 hours 10/17/21 1022 10/20/21 2159      Culture/Microbiology No results found for: SDES, SPECREQUEST, CULT, REPTSTATUS  Other culture-see note   Radiology Studies: No results found.   LOS: 3 days   Antonieta Pert, MD [facility]  10/19/2021, 8:05 AM

## 2021-10-20 ENCOUNTER — Inpatient Hospital Stay (HOSPITAL_COMMUNITY): Payer: Medicare HMO | Admitting: Registered Nurse

## 2021-10-20 ENCOUNTER — Encounter (HOSPITAL_COMMUNITY): Payer: Self-pay | Admitting: Internal Medicine

## 2021-10-20 ENCOUNTER — Inpatient Hospital Stay (HOSPITAL_COMMUNITY): Payer: Medicare HMO

## 2021-10-20 ENCOUNTER — Encounter (HOSPITAL_COMMUNITY)
Admission: EM | Disposition: A | Discharge status: Home / Self Care | Payer: Self-pay | Source: Home / Self Care | Attending: Internal Medicine

## 2021-10-20 DIAGNOSIS — K8050 Calculus of bile duct without cholangitis or cholecystitis without obstruction: Secondary | ICD-10-CM

## 2021-10-20 DIAGNOSIS — R1011 Right upper quadrant pain: Secondary | ICD-10-CM

## 2021-10-20 DIAGNOSIS — J449 Chronic obstructive pulmonary disease, unspecified: Secondary | ICD-10-CM

## 2021-10-20 DIAGNOSIS — I10 Essential (primary) hypertension: Secondary | ICD-10-CM

## 2021-10-20 DIAGNOSIS — I2510 Atherosclerotic heart disease of native coronary artery without angina pectoris: Secondary | ICD-10-CM

## 2021-10-20 HISTORY — PX: REMOVAL OF STONES: SHX5545

## 2021-10-20 HISTORY — PX: SPHINCTEROTOMY: SHX5544

## 2021-10-20 HISTORY — PX: ERCP: SHX5425

## 2021-10-20 LAB — COMPREHENSIVE METABOLIC PANEL
ALT: 108 U/L — ABNORMAL HIGH (ref 0–44)
AST: 110 U/L — ABNORMAL HIGH (ref 15–41)
Albumin: 2.1 g/dL — ABNORMAL LOW (ref 3.5–5.0)
Alkaline Phosphatase: 280 U/L — ABNORMAL HIGH (ref 38–126)
Anion gap: 1 — ABNORMAL LOW (ref 5–15)
BUN: 10 mg/dL (ref 8–23)
CO2: 28 mmol/L (ref 22–32)
Calcium: 8.3 mg/dL — ABNORMAL LOW (ref 8.9–10.3)
Chloride: 109 mmol/L (ref 98–111)
Creatinine, Ser: 1.1 mg/dL (ref 0.61–1.24)
GFR, Estimated: >60 mL/min (ref >60)
Glucose, Bld: 90 mg/dL (ref 70–99)
Potassium: 3.7 mmol/L (ref 3.5–5.1)
Sodium: 138 mmol/L (ref 135–145)
Total Bilirubin: 2.8 mg/dL — ABNORMAL HIGH (ref 0.3–1.2)
Total Protein: 5.7 g/dL — ABNORMAL LOW (ref 6.5–8.1)

## 2021-10-20 LAB — CBC
HCT: 29.1 % — ABNORMAL LOW (ref 39.0–52.0)
Hemoglobin: 9.6 g/dL — ABNORMAL LOW (ref 13.0–17.0)
MCH: 32.5 pg (ref 26.0–34.0)
MCHC: 33 g/dL (ref 30.0–36.0)
MCV: 98.6 fL (ref 80.0–100.0)
Platelets: 350 10*3/uL (ref 150–400)
RBC: 2.95 MIL/uL — ABNORMAL LOW (ref 4.22–5.81)
RDW: 16 % — ABNORMAL HIGH (ref 11.5–15.5)
WBC: 5.9 10*3/uL (ref 4.0–10.5)
nRBC: 0 % (ref 0.0–0.2)

## 2021-10-20 SURGERY — ERCP, WITH INTERVENTION IF INDICATED
Anesthesia: General

## 2021-10-20 MED ORDER — SODIUM CHLORIDE 0.9 % IV SOLN
INTRAVENOUS | Status: DC | PRN
  Administered 2021-10-20: 35 mL

## 2021-10-20 MED ORDER — PHENYLEPHRINE HCL (PRESSORS) 10 MG/ML IV SOLN
INTRAVENOUS | Status: AC
  Filled 2021-10-20: qty 1

## 2021-10-20 MED ORDER — ONDANSETRON HCL 4 MG/2ML IJ SOLN
INTRAMUSCULAR | Status: DC | PRN
Start: 2021-10-20 — End: 2021-10-20
  Administered 2021-10-20: 4 mg via INTRAVENOUS

## 2021-10-20 MED ORDER — PROPOFOL 10 MG/ML IV BOLUS
INTRAVENOUS | Status: DC | PRN
Start: 2021-10-20 — End: 2021-10-20
  Administered 2021-10-20: 130 mg via INTRAVENOUS

## 2021-10-20 MED ORDER — HYDROMORPHONE HCL 1 MG/ML IJ SOLN
0.5000 mg | Freq: Once | INTRAMUSCULAR | Status: AC
  Administered 2021-10-20: 0.5 mg via INTRAVENOUS
  Filled 2021-10-20: qty 0.5

## 2021-10-20 MED ORDER — SUGAMMADEX SODIUM 200 MG/2ML IV SOLN
INTRAVENOUS | Status: DC | PRN
  Administered 2021-10-20: 200 mg via INTRAVENOUS

## 2021-10-20 MED ORDER — GLUCAGON HCL RDNA (DIAGNOSTIC) 1 MG IJ SOLR
INTRAMUSCULAR | Status: AC
  Filled 2021-10-20: qty 1

## 2021-10-20 MED ORDER — INDOMETHACIN 50 MG RE SUPP
RECTAL | Status: AC
  Filled 2021-10-20: qty 2

## 2021-10-20 MED ORDER — FENTANYL CITRATE (PF) 100 MCG/2ML IJ SOLN
INTRAMUSCULAR | Status: DC | PRN
  Administered 2021-10-20: 50 ug via INTRAVENOUS

## 2021-10-20 MED ORDER — ALUM & MAG HYDROXIDE-SIMETH 200-200-20 MG/5ML PO SUSP
30.0000 mL | ORAL | Status: DC | PRN
  Administered 2021-10-20 – 2021-10-21 (×2): 30 mL via ORAL
  Filled 2021-10-20 (×2): qty 30

## 2021-10-20 MED ORDER — EPHEDRINE SULFATE-NACL 50-0.9 MG/10ML-% IV SOSY
PREFILLED_SYRINGE | INTRAVENOUS | Status: DC | PRN
  Administered 2021-10-20 (×2): 5 mg via INTRAVENOUS

## 2021-10-20 MED ORDER — LIDOCAINE 2% (20 MG/ML) 5 ML SYRINGE
INTRAMUSCULAR | Status: DC | PRN
  Administered 2021-10-20: 60 mg via INTRAVENOUS

## 2021-10-20 MED ORDER — MORPHINE SULFATE (PF) 2 MG/ML IV SOLN
1.0000 mg | Freq: Once | INTRAVENOUS | Status: AC
  Administered 2021-10-20: 1 mg via INTRAVENOUS
  Filled 2021-10-20: qty 1

## 2021-10-20 MED ORDER — SODIUM CHLORIDE 0.9 % IV SOLN
3.0000 g | Freq: Once | INTRAVENOUS | Status: AC
  Administered 2021-10-20: 3 g via INTRAVENOUS
  Filled 2021-10-20: qty 8

## 2021-10-20 MED ORDER — ROCURONIUM BROMIDE 10 MG/ML (PF) SYRINGE
PREFILLED_SYRINGE | INTRAVENOUS | Status: DC | PRN
  Administered 2021-10-20: 50 mg via INTRAVENOUS

## 2021-10-20 MED ORDER — INDOMETHACIN 50 MG RE SUPP
RECTAL | Status: DC | PRN
  Administered 2021-10-20: 100 mg via RECTAL

## 2021-10-20 MED ORDER — FENTANYL CITRATE (PF) 100 MCG/2ML IJ SOLN
INTRAMUSCULAR | Status: AC
  Filled 2021-10-20: qty 2

## 2021-10-20 MED ORDER — PHENYLEPHRINE HCL-NACL 20-0.9 MG/250ML-% IV SOLN
INTRAVENOUS | Status: DC | PRN
  Administered 2021-10-20: 35 ug/min via INTRAVENOUS

## 2021-10-20 MED ORDER — DEXAMETHASONE SODIUM PHOSPHATE 10 MG/ML IJ SOLN
INTRAMUSCULAR | Status: DC | PRN
Start: 2021-10-20 — End: 2021-10-20
  Administered 2021-10-20: 8 mg via INTRAVENOUS

## 2021-10-20 NOTE — Op Note (Addendum)
[facility] Patient Name: Michael [name] Procedure Date: 10/20/2021 MRN: [id number] Attending MD: Gatha Mayer , MD Date of Birth: 02-Feb-1946 CSN: [id number] Age: 76 Admit Type: Inpatient Procedure:                ERCP with biliary sphincterotomy and bile duct                            stone removal Indications:              Bile duct stone(s), For therapy of bile duct                            stone(s) Providers:                Gatha Mayer, MD, Dulcy Fanny, Despina Pole, Technician, Janie Billups, Technician,                            Courtney Heys. [place], CRNA Referring MD:              Medicines:                General Anesthesia, Zosyn 3.375 g IV Indomethacin                            100 mg per rectum Complications:            No immediate complications. Estimated Blood Loss:     Estimated blood loss was minimal. Procedure:                Pre-Anesthesia Assessment:                           - Prior to the procedure, a History and Physical                            was performed, and patient medications and                            allergies were reviewed. The patient's tolerance of                            previous anesthesia was also reviewed. The risks                            and benefits of the procedure and the sedation                            options and risks were discussed with the patient.                            All questions were answered, and informed consent  was obtained. Prior Anticoagulants: The patient has                            taken no previous anticoagulant or antiplatelet                            agents. ASA Grade Assessment: III - A patient with                            severe systemic disease. After reviewing the risks                            and benefits, the patient was deemed in                            satisfactory condition to undergo the  procedure.                           After obtaining informed consent, the scope was                            passed under direct vision. Throughout the                            procedure, the patient's blood pressure, pulse, and                            oxygen saturations were monitored continuously. The                            [id number] Olympus duodenoscope was                            introduced through the mouth, and used to inject                            contrast into and used to inject contrast into the                            bile duct. The ERCP was somewhat difficult due to                            unusual anatomy. The patient tolerated the                            procedure well. Scope In: Scope Out: Findings:      The scout film was normal. The esophagus was successfully intubated       under direct vision. The scope was advanced to a normal major papilla in       the descending duodenum without detailed examination of the pharynx,       larynx and associated structures, and upper GI tract. The upper GI tract       was grossly normal. Papilla not seen initially but was found.  Initially       wire into pancreatic duct so left there and then cannulated bile duct       with wire. Injection revealed dilated CBD with 8 mm mobile round stone.       Max diameter CBD/CHD about 15 mm. Intrahepatics mildly dilated. Biliary       sphincterotomy performed and stone extracted with balloon and negative       occlusion cholangiogram. Attempted but unsucessful pancreatic stent -       could not recannulate pancreatic duct. Gallbladder did not fill. Impression:               - Choledocholithiasis was found. Complete removal                            was accomplished by biliary sphincterotomy and                            balloon extraction. Moderate Sedation:      Not Applicable - Patient had care per Anesthesia. Recommendation:           - Observe overnight  and if ok in AM advance diet                            from clears and restart clopidogrel                           Ideally would do cholecystectomy but apparently not                            a good candidate (co-morbidities)                           we will check in AM will need LFT's in our office 1                            week after dc can order in my name                           I spoke to daughter Procedure Code(s):        --- Professional ---                           [phone], Endoscopic retrograde                            cholangiopancreatography (ERCP); with removal of                            calculi/debris from biliary/pancreatic duct(s)                           43262, Endoscopic retrograde                            cholangiopancreatography (ERCP); with  sphincterotomy/papillotomy Diagnosis Code(s):        --- Professional ---                           K80.50, Calculus of bile duct without cholangitis                            or cholecystitis without obstruction CPT copyright 2019 American Medical Association. All rights reserved. The codes documented in this report are preliminary and upon coder review may  be revised to meet current compliance requirements. Gatha Mayer, MD 10/20/2021 2:15:38 PM This report has been signed electronically. Number of Addenda: 0

## 2021-10-20 NOTE — Interval H&P Note (Signed)
History and Physical Interval Note:  10/20/2021 12:14 PM  Michael [name]  has presented today for surgery, with the diagnosis of Common bile duct stones.  The various methods of treatment have been discussed with the patient and family. After consideration of risks, benefits and other options for treatment, the patient has consented to  Procedure(s): ENDOSCOPIC RETROGRADE CHOLANGIOPANCREATOGRAPHY (ERCP) (N/A) as a surgical intervention.  The patient's history has been reviewed, patient examined, no change in status, stable for surgery.  I have reviewed the patient's chart and labs.  Questions were answered to the patient's satisfaction.     Stan Head

## 2021-10-20 NOTE — Transfer of Care (Signed)
Immediate Anesthesia Transfer of Care Note  Patient: Michael [name]  Procedure(s) Performed: ENDOSCOPIC RETROGRADE CHOLANGIOPANCREATOGRAPHY (ERCP) SPHINCTEROTOMY REMOVAL OF STONES  Patient Location: PACU and Endoscopy Unit  Anesthesia Type:General  Level of Consciousness: awake, alert , oriented and patient cooperative  Airway & Oxygen Therapy: Patient Spontanous Breathing and Patient connected to face mask oxygen  Post-op Assessment: Report given to RN, Post -op Vital signs reviewed and stable and Patient moving all extremities  Post vital signs: Reviewed and stable  Last Vitals:  Vitals Value Taken Time  BP    Temp    Pulse 67 10/20/21 1412  Resp 17 10/20/21 1412  SpO2 100 % 10/20/21 1412  Vitals shown include unvalidated device data.  Last Pain:  Vitals:   10/20/21 1143  TempSrc: Oral  PainSc: 0-No pain      Patients Stated Pain Goal: 2 ([id number])  Complications: No notable events documented.

## 2021-10-20 NOTE — Care Management Important Message (Signed)
Important Message  Patient Details IM Letter placed in Patients room. Name: Michael [name] MRN: [id number] Date of Birth: [birth year]   Medicare Important Message Given:  Yes     Caren Macadam 10/20/2021, 2:54 PM

## 2021-10-20 NOTE — Anesthesia Postprocedure Evaluation (Signed)
Anesthesia Post Note  Patient: Michael [name]  Procedure(s) Performed: ENDOSCOPIC RETROGRADE CHOLANGIOPANCREATOGRAPHY (ERCP) SPHINCTEROTOMY REMOVAL OF STONES     Patient location during evaluation: Phase II Anesthesia Type: General Level of consciousness: awake Pain management: pain level controlled Vital Signs Assessment: post-procedure vital signs reviewed and stable Cardiovascular status: stable Postop Assessment: no apparent nausea or vomiting Anesthetic complications: no   No notable events documented.  Last Vitals:  Vitals:   10/20/21 1425 10/20/21 1430  BP:  130/60  Pulse: 66 66  Resp: (!) 23 13  Temp:    SpO2: 100% 100%    Last Pain:  Vitals:   10/20/21 1430  TempSrc:   PainSc: 0-No pain                 Caren Macadam

## 2021-10-20 NOTE — Progress Notes (Signed)
PROGRESS NOTE Michael [name]  HWE:[id number] DOB: 23-Sep-1945 DOA: 10/14/2021 PCP: Lorrene Reid, PA-C   Brief Narrative/Hospital Course: 76 year old gentleman with medical history of CAD multiple vessel with CABG, PCI, carotid artery occlusion, hypertension, HLD, PAD, hypertension, COPD/chronic respiratory failure on 4 L nasal cannula, gout followed by Dr. Rush Landmark for abdominal pain, with H. pylori antigen positive treated and eradicated confirmed with EGD on 07/15/2019 has had abnormal LFTs for several months and recently worsened, underwent further work-up with CT scan of the abdomen pelvis and still jaundiced and directed to the ED for admission. In the ED, hemodynamically stable, on room air, labs shows creatinine at 1.5 alk phos 442 AST 132 ALT 144 total bili 4.2-Labs on 215 over alk phos 426, AST 180 ALT 197 and TB 7.1 GGT 330. GI was consulted s/p  MRCP - showed cholelithiasis and choledocholithiasis.  Monitor closely with serial LFTs, had leukocytosis placed on IV antibiotics with Zosyn, patient remains hospitalized for ERCP after Plavix washout on 10/20/21.    Subjective: Seen and examined this morning.  Nursing tried to obtain additional larger bore IV line Overnight afebrile. waiting for ERCP this morning.   LFTs improving. Patient joking around not in distress.  Assessment and Plan: * Pain in the abdomen- (present on admission) Cholelithiasis and choledocholithiasis symptomatic Elevated LFTs due to choledocholithiasis Abdominal pain nausea and weight loss: MRCP-cholelithiasis and choledocholithiasis- symptomatic w/ abdominal pain nausea, elevated LFTs. LFTs downtrending, IV Zosyn due to concern of infection given significant leukocytosis that has resolved For ERCP today after Plavix washout  Cholelithiasis with choledocholithiasis- (present on admission) See #1  Abnormal LFTs- (present on admission) See above #1  Acute renal failure superimposed on stage 3a chronic kidney  disease ([place]) Suspect patient presented with AKI due to his GI disease nausea vomiting.  Likely baseline creatinine around 1.1-1.2 with CKD stage II vs CKD3a given creatinine has improved to 1.1-1.2 from 1.5 on presentation with IV fluids. Cont gentle IV fluids, monitor lab Recent Labs  Lab 10/16/21 0444 10/17/21 0446 10/18/21 0417 10/19/21 0502 10/20/21 0424  BUN 16 16 13 12 10   CREATININE 1.26* 1.15 1.27* 1.24 1.10    Chronic respiratory failure with hypoxia (HCC) - SaO2 drops to 87% with ambulation- (present on admission) See copd.  Chronic obstructive pulmonary disease (HCC)- (present on admission) COPD Chronic hypoxic respiratory failure on 4 L: COPD compensated continue home oxygen 4 L and inhalers.  Per PCP he declined seeing pulmonary for PFTs.  I-S OOB monitor perioperative complication    CAD, multiple vessel - status post CABG: LIMA-LAD, SVG-RPDA; PCI native RCA for 100% SVG-RCA- (present on admission) CAD with history of CABG and stent HLD PVD History of carotid stenosis: Patient without chest pain.  He is on metoprolol, Plavix and statin on hold  2/2 #1  Essential hypertension- (present on admission) BP stable in low 100 on metoprolol  Hyperlipidemia with target LDL less than 70- (present on admission) Holding statin due to abnormal LFTs  Peripheral arterial occlusive disease ([place])- (present on admission) See above.  Choledocholithiasis See #1  Malnutrition of moderate degree- (present on admission) Dietitian on board, augment diet as below. Nutrition Problem: Moderate Malnutrition Etiology: acute illness (cholelithiasis) Signs/Symptoms: mild fat depletion, mild muscle depletion Interventions: Education    Anemia due to chronic kidney disease Stable 9-10 gms.monitor.   Hypothyroidism- (present on admission) Cont synthroid.   DVT prophylaxis: SCDs Start: 10/14/21 1628 off  Lovenox for ercp Code Status:   Code Status: Full Code Family  Communication:  plan of care discussed with patient/ at bedside. Disposition:Currently not medically stable for discharge. Status HY:[id number]. The patient remains hospitalized for the need for ERCP coming Monday.   Objective: Vitals last 24 hrs: Vitals:   10/19/21 0501 10/19/21 1408 10/19/21 2017 10/20/21 0614  BP: (!) 116/59 123/65 113/68 109/69  Pulse: 66 69 67 64  Resp: 15 16 18 18   Temp: 98.1 F (36.7 C) 98.4 F (36.9 C) 97.8 F (36.6 C) (!) 97.5 F (36.4 C)  TempSrc: Oral Oral Oral   SpO2: 100% 100% 100% 93%  Weight:      Height:       Weight change:   Physical Examination: General exam: AA0x3,older than stated age, weak appearing. HEENT:Oral mucosa moist, Ear/Nose WNL grossly, dentition normal. Respiratory system: bilaterally diminished,no use of accessory muscle Cardiovascular system: S1 & S2 +, No JVD,. Gastrointestinal system: Abdomen soft,NT,ND, BS+ Nervous System:Alert, awake, moving extremities and grossly nonfocal Extremities: edema neg,distal peripheral pulses palpable.  Skin: No rashes,no icterus. MSK: Normal muscle bulk,tone, power   Medications reviewed:  Scheduled Meds:  fenofibrate  54 mg Oral Daily   levothyroxine  75 mcg Oral Q0600   metoprolol tartrate  25 mg Oral BID   pantoprazole  40 mg Oral Daily   Continuous Infusions:  ampicillin-sulbactam (UNASYN) 3 g IVPB (Mini-Bag Plus)     lactated ringers 75 mL/hr at 10/19/21 2022   piperacillin-tazobactam (ZOSYN)  IV 3.375 g (10/20/21 0554)      Diet Order             Diet NPO time specified  Diet effective midnight                    Nutrition Problem: Moderate Malnutrition Etiology: acute illness (cholelithiasis) Signs/Symptoms: mild fat depletion, mild muscle depletion Interventions: Education   Intake/Output Summary (Last 24 hours) at 10/20/2021 0926 Last data filed at 10/20/2021 0835 Gross per 24 hour  Intake 1608.23 ml  Output 2240 ml  Net -631.77 ml   Net IO Since  Admission: 5,740.11 mL [10/20/21 0926]  Wt Readings from Last 3 Encounters:  10/14/21 85.7 kg  07/14/21 90.5 kg  06/17/21 90.5 kg     Unresulted Labs (From admission, onward)     Start     Ordered   10/19/21 0500  Comprehensive metabolic panel  Daily,   R      10/18/21 0959          Data Reviewed: I have personally reviewed following labs and imaging studies CBC: Recent Labs  Lab 10/14/21 1407 10/15/21 0444 10/16/21 1114 10/18/21 0417 10/19/21 0502 10/20/21 0424  WBC 7.8 7.5 19.6* 7.1 7.3 5.9  NEUTROABS 5.6  --  17.3*  --   --   --   HGB 12.2* 10.0* 9.9* 9.6* 9.2* 9.6*  HCT 37.1* 31.7* 29.8* 29.2* 28.2* 29.1*  MCV 98.1 101.6* 99.3 99.7 98.6 98.6  PLT 438* 361 345 347 354 948   Basic Metabolic Panel: Recent Labs  Lab 10/16/21 0444 10/17/21 0446 10/18/21 0417 10/19/21 0502 10/20/21 0424  NA 135 136 136 137 138  K 3.9 3.7 3.4* 3.8 3.7  CL 106 106 105 106 109  CO2 25 25 25 26 28   GLUCOSE 139* 97 99 111* 90  BUN 16 16 13 12 10   CREATININE 1.26* 1.15 1.27* 1.24 1.10  CALCIUM 8.2* 8.1* 7.9* 8.0* 8.3*   GFR: Estimated Creatinine Clearance: 58.8 mL/min (by C-G formula based on SCr of 1.1 mg/dL). Liver Function Tests:  Recent Labs  Lab 10/16/21 0444 10/17/21 0446 10/18/21 0417 10/19/21 0502 10/20/21 0424  AST 191* 140* 117* 124* 110*  ALT 148* 125* 118* 113* 108*  ALKPHOS 404* 349* 306* 309* 280*  BILITOT 7.3* 6.4* 4.0* 3.6* 2.8*  PROT 6.0* 5.6* 5.6* 5.6* 5.7*  ALBUMIN 2.4* 2.3* 2.2* 2.0* 2.1*   Recent Labs  Lab 10/14/21 1407  LIPASE 39   No results for input(s): AMMONIA in the last 168 hours. Coagulation Profile: No results for input(s): INR, PROTIME in the last 168 hours. Cardiac Enzymes: No results for input(s): CKTOTAL, CKMB, CKMBINDEX, TROPONINI in the last 168 hours. BNP (last 3 results) No results for input(s): PROBNP in the last 8760 hours. HbA1C: No results for input(s): HGBA1C in the last 72 hours. CBG: No results for input(s): GLUCAP  in the last 168 hours. Lipid Profile: No results for input(s): CHOL, HDL, LDLCALC, TRIG, CHOLHDL, LDLDIRECT in the last 72 hours. Thyroid Function Tests: No results for input(s): TSH, T4TOTAL, FREET4, T3FREE, THYROIDAB in the last 72 hours. Anemia Panel: No results for input(s): VITAMINB12, FOLATE, FERRITIN, TIBC, IRON, RETICCTPCT in the last 72 hours. Sepsis Labs: No results for input(s): PROCALCITON, LATICACIDVEN in the last 168 hours.  Recent Results (from the past 675 hour(s))  Helicobacter pylori special antigen     Status: None   Collection Time: 10/13/21 11:52 AM   Specimen: Blood  Result Value Ref Range Status   MICRO NUMBER: [id number]  Final   SPECIMEN QUALITY Adequate  Final   SOURCE: STOOL  Final   STATUS: FINAL  Final   RESULT:   Final    Not Detected  Antimicrobials, proton pump inhibitors, and bismuth preparations inhibit H. pylori and ingestion up to two weeks prior to testing may cause false negative results. If clinically indicated the test should be repeated on a new specimen  obtained two weeks after discontinuing treatment.     Comment: Reference Range:Not Detected  Resp Panel by RT-PCR (Flu A&B, Covid) Nasopharyngeal Swab     Status: None   Collection Time: 10/14/21  4:17 PM   Specimen: Nasopharyngeal Swab; Nasopharyngeal(NP) swabs in vial transport medium  Result Value Ref Range Status   SARS Coronavirus 2 by RT PCR NEGATIVE NEGATIVE Final    Comment: (NOTE) SARS-CoV-2 target nucleic acids are NOT DETECTED.  The SARS-CoV-2 RNA is generally detectable in upper respiratory specimens during the acute phase of infection. The lowest concentration of SARS-CoV-2 viral copies this assay can detect is 138 copies/mL. A negative result does not preclude SARS-Cov-2 infection and should not be used as the sole basis for treatment or other patient management decisions. A negative result may occur with  improper specimen collection/handling, submission of specimen  other than nasopharyngeal swab, presence of viral mutation(s) within the areas targeted by this assay, and inadequate number of viral copies(<138 copies/mL). A negative result must be combined with clinical observations, patient history, and epidemiological information. The expected result is Negative.  Fact Sheet for Patients:  EntrepreneurPulse.com.au  Fact Sheet for Healthcare Providers:  IncredibleEmployment.be  This test is no t yet approved or cleared by the [place] FDA and  has been authorized for detection and/or diagnosis of SARS-CoV-2 by FDA under an Emergency Use Authorization (EUA). This EUA will remain  in effect (meaning this test can be used) for the duration of the COVID-19 declaration under Section 564(b)(1) of the Act, 21 U.S.C.section 360bbb-3(b)(1), unless the authorization is terminated  or revoked sooner.  Influenza A by PCR NEGATIVE NEGATIVE Final   Influenza B by PCR NEGATIVE NEGATIVE Final    Comment: (NOTE) The Xpert Xpress SARS-CoV-2/FLU/RSV plus assay is intended as an aid in the diagnosis of influenza from Nasopharyngeal swab specimens and should not be used as a sole basis for treatment. Nasal washings and aspirates are unacceptable for Xpert Xpress SARS-CoV-2/FLU/RSV testing.  Fact Sheet for Patients: EntrepreneurPulse.com.au  Fact Sheet for Healthcare Providers: IncredibleEmployment.be  This test is not yet approved or cleared by the [place] FDA and has been authorized for detection and/or diagnosis of SARS-CoV-2 by FDA under an Emergency Use Authorization (EUA). This EUA will remain in effect (meaning this test can be used) for the duration of the COVID-19 declaration under Section 564(b)(1) of the Act, 21 U.S.C. section 360bbb-3(b)(1), unless the authorization is terminated or revoked.  Performed at [facility], [place] [address], [place], [place] [address]     Antimicrobials: Anti-infectives (From admission, onward)    Start     Dose/Rate Route Frequency Ordered Stop   10/20/21 0945  Ampicillin-Sulbactam (UNASYN) 3 g in sodium chloride 0.9 % 100 mL IVPB        3 g 200 mL/hr over 30 Minutes Intravenous  Once 10/20/21 0850     10/17/21 1400  piperacillin-tazobactam (ZOSYN) IVPB 3.375 g        3.375 g 12.5 mL/hr over 240 Minutes Intravenous Every 8 hours 10/17/21 1022 10/20/21 2159      Culture/Microbiology No results found for: SDES, SPECREQUEST, CULT, REPTSTATUS  Other culture-see note   Radiology Studies: No results found.   LOS: 4 days   Antonieta Pert, MD [facility]  10/20/2021, 9:26 AM

## 2021-10-20 NOTE — Anesthesia Preprocedure Evaluation (Signed)
Anesthesia Evaluation  Patient identified by MRN, date of birth, ID band Patient awake    Reviewed: Allergy & Precautions, NPO status , Patient's Chart, lab work & pertinent test results, reviewed documented beta blocker date and time   History of Anesthesia Complications Negative for: history of anesthetic complications  Airway Mallampati: I  TM Distance: >3 FB Neck ROM: Full    Dental  (+) Edentulous Upper, Edentulous Lower, Upper Dentures   Pulmonary COPD,  oxygen dependent, former smoker,    Pulmonary exam normal        Cardiovascular hypertension, Pt. on home beta blockers and Pt. on medications + CAD, + Past MI, + Cardiac Stents, + CABG and + Peripheral Vascular Disease  Normal cardiovascular exam     Neuro/Psych negative neurological ROS  negative psych ROS   GI/Hepatic Neg liver ROS, GERD  Medicated and Controlled,  Endo/Other  Hypothyroidism   Renal/GU Renal InsufficiencyRenal disease  negative genitourinary   Musculoskeletal  (+) Arthritis ,   Abdominal Normal abdominal exam  (+)   Peds  Hematology negative hematology ROS (+)   Anesthesia Other Findings Day of surgery medications reviewed with patient.  Reproductive/Obstetrics negative OB ROS                             Anesthesia Physical  Anesthesia Plan  ASA: 3  Anesthesia Plan: General   Post-op Pain Management: Minimal or no pain anticipated   Induction: Intravenous  PONV Risk Score and Plan: 2 and Treatment may vary due to age or medical condition, Ondansetron and Dexamethasone  Airway Management Planned: Oral ETT  Additional Equipment: None  Intra-op Plan:   Post-operative Plan: Extubation in OR  Informed Consent: I have reviewed the patients History and Physical, chart, labs and discussed the procedure including the risks, benefits and alternatives for the proposed anesthesia with the patient or authorized  representative who has indicated his/her understanding and acceptance.     Dental advisory given  Plan Discussed with: CRNA  Anesthesia Plan Comments:         Anesthesia Quick Evaluation

## 2021-10-20 NOTE — Anesthesia Procedure Notes (Signed)
Procedure Name: Intubation Date/Time: 10/20/2021 12:39 PM Performed by: Victoriano Lain, CRNA Pre-anesthesia Checklist: Patient identified, Emergency Drugs available, Patient being monitored, Suction available and Timeout performed Patient Re-evaluated:Patient Re-evaluated prior to induction Oxygen Delivery Method: Circle system utilized Preoxygenation: Pre-oxygenation with 100% oxygen Induction Type: IV induction Ventilation: Mask ventilation without difficulty Laryngoscope Size: Mac and 4 Grade View: Grade I Tube type: Oral Tube size: 7.5 mm Number of attempts: 1 Airway Equipment and Method: Stylet Placement Confirmation: ETT inserted through vocal cords under direct vision, positive ETCO2 and breath sounds checked- equal and bilateral Secured at: 22 cm Tube secured with: Tape Dental Injury: Teeth and Oropharynx as per pre-operative assessment

## 2021-10-21 ENCOUNTER — Encounter (HOSPITAL_COMMUNITY): Payer: Self-pay | Admitting: Internal Medicine

## 2021-10-21 DIAGNOSIS — K859 Acute pancreatitis without necrosis or infection, unspecified: Secondary | ICD-10-CM

## 2021-10-21 DIAGNOSIS — K9189 Other postprocedural complications and disorders of digestive system: Secondary | ICD-10-CM

## 2021-10-21 LAB — COMPREHENSIVE METABOLIC PANEL
ALT: 93 U/L — ABNORMAL HIGH (ref 0–44)
AST: 72 U/L — ABNORMAL HIGH (ref 15–41)
Albumin: 2.5 g/dL — ABNORMAL LOW (ref 3.5–5.0)
Alkaline Phosphatase: 261 U/L — ABNORMAL HIGH (ref 38–126)
Anion gap: 6 (ref 5–15)
BUN: 16 mg/dL (ref 8–23)
CO2: 27 mmol/L (ref 22–32)
Calcium: 8.7 mg/dL — ABNORMAL LOW (ref 8.9–10.3)
Chloride: 105 mmol/L (ref 98–111)
Creatinine, Ser: 1.06 mg/dL (ref 0.61–1.24)
GFR, Estimated: >60 mL/min (ref >60)
Glucose, Bld: 126 mg/dL — ABNORMAL HIGH (ref 70–99)
Potassium: 4.9 mmol/L (ref 3.5–5.1)
Sodium: 138 mmol/L (ref 135–145)
Total Bilirubin: 2.6 mg/dL — ABNORMAL HIGH (ref 0.3–1.2)
Total Protein: 6.5 g/dL (ref 6.5–8.1)

## 2021-10-21 LAB — LIPASE, BLOOD: Lipase: 566 U/L — ABNORMAL HIGH (ref 11–51)

## 2021-10-21 MED ORDER — ATORVASTATIN CALCIUM 20 MG PO TABS
20.0000 mg | ORAL_TABLET | Freq: Every day | ORAL | Status: DC
  Administered 2021-10-21: 20 mg via ORAL
  Filled 2021-10-21: qty 1

## 2021-10-21 MED ORDER — HYDROMORPHONE HCL 1 MG/ML IJ SOLN
0.5000 mg | Freq: Once | INTRAMUSCULAR | Status: AC | PRN
  Administered 2021-10-21: 0.5 mg via INTRAVENOUS
  Filled 2021-10-21: qty 0.5

## 2021-10-21 MED ORDER — MELATONIN 3 MG PO TABS
3.0000 mg | ORAL_TABLET | Freq: Every day | ORAL | Status: DC
  Administered 2021-10-21: 3 mg via ORAL
  Filled 2021-10-21: qty 1

## 2021-10-21 MED ORDER — POLYETHYLENE GLYCOL 3350 17 G PO PACK
17.0000 g | PACK | Freq: Every day | ORAL | Status: DC
  Administered 2021-10-21 – 2021-10-22 (×2): 17 g via ORAL
  Filled 2021-10-21 (×2): qty 1

## 2021-10-21 MED ORDER — CLOPIDOGREL BISULFATE 75 MG PO TABS
75.0000 mg | ORAL_TABLET | Freq: Every day | ORAL | Status: DC
Start: 2021-10-21 — End: 2021-10-22
  Administered 2021-10-21 – 2021-10-22 (×2): 75 mg via ORAL
  Filled 2021-10-21 (×2): qty 1

## 2021-10-21 NOTE — Progress Notes (Signed)
Progress Note   Subjective  Chief Complaint: Choledocholithiasis status post ERCP with biliary sphincterotomy and bile duct stone removal 2/27  Patient is not happy this morning.  He tells me that he thought the procedure he had yesterday was going to take care of everything but his stomach hurts more today, in the upper abdomen.  Apparently he tried to eat some ice cream last night and vomited a little bit afterwards.  Tells me "I am not going to eat anything today because I do not want to bother anything".  He is very frustrated with his "growing medical bill".   Objective   Vital signs in last 24 hours: Temp:  [97.1 F (36.2 C)-98.4 F (36.9 C)] 97.7 F (36.5 C) (02/28 0536) Pulse Rate:  [60-69] 60 (02/28 0536) Resp:  [13-23] 15 (02/28 0536) BP: (117-157)/(48-84) 136/84 (02/28 0536) SpO2:  [100 %] 100 % (02/28 0536) Weight:  [85.7 kg] 85.7 kg (02/27 1143) Last BM Date : 10/19/21 General:    white male in NAD Heart:  Regular rate and rhythm; no murmurs Lungs: Respirations even and unlabored, lungs CTA bilaterally Abdomen:  Soft, mild epigastric ttp and nondistended. Normal bowel sounds. Psych:  Cooperative. Normal mood and affect.  Intake/Output from previous day: 02/27 0701 - 02/28 0700 In: 2741.7 [P.O.:600; I.V.:2004.9; IV Piggyback:136.8] Out: 690 [Urine:690] Intake/Output this shift: Total I/O In: -  Out: 200 [Urine:200]  Lab Results: Recent Labs    10/19/21 0502 10/20/21 0424  WBC 7.3 5.9  HGB 9.2* 9.6*  HCT 28.2* 29.1*  PLT 354 350   BMET Recent Labs    10/19/21 0502 10/20/21 0424 10/21/21 0420  NA 137 138 138  K 3.8 3.7 4.9  CL 106 109 105  CO2 26 28 27   GLUCOSE 111* 90 126*  BUN 12 10 16   CREATININE 1.24 1.10 1.06  CALCIUM 8.0* 8.3* 8.7*   LFT Recent Labs    10/21/21 0420  PROT 6.5  ALBUMIN 2.5*  AST 72*  ALT 93*  ALKPHOS 261*  BILITOT 2.6*   Studies/Results: DG ERCP  Result Date: 10/20/2021 CLINICAL DATA:  ERCP EXAM: ERCP  TECHNIQUE: Multiple spot images obtained with the fluoroscopic device and submitted for interpretation post-procedure. FLUOROSCOPY: Refer to procedure report COMPARISON:  None. FINDINGS: A total of 17 fluoroscopic spot images taken during ERCP are submitted for review. Initial images demonstrate scope overlying the upper abdomen and wire catheterization of the CBD. The CBD appears somewhat dilated, although images are not calibrated for independent measurements. Balloon sweeps of the CBD or performed. Final images demonstrate withdrawal of scope and all instrumentation. IMPRESSION: ERCP images as described. Refer to procedure report for full details. These images were submitted for radiologic interpretation only. Please see the procedural report for the amount of contrast and the fluoroscopy time utilized. Electronically Signed   By: 10/23/21 M.D.   On: 10/20/2021 14:58     Assessment / Plan:   Assessment: 1.  Choledocholithiasis: Causing recurrent upper abdominal pain and jaundice, now status post ERCP 2/27 with LFTs trending down, though increasing epigastric pain overnight; consider post ERCP pancreatitis 2.  Advanced COPD with chronic oxygen supplement requirement 3.  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  As per Dr. 10/22/2021 resume Plavix 2.  Patient does have increased abdominal pain today, though is not quite as tender as he describes on exam, did have an episode of vomiting when trying to eat ice cream last night, question post ERCP  pancreatitis. 3.  Ordered lipase level this morning 4.  Patient will need recheck of LFTs in 1 week, this will be ordered at our office 5.  Okay with clears today as tolerated  Thank you for your kind consultation, we will continue to follow   LOS: 5 days   Unk Lightning  10/21/2021, 9:03 AM

## 2021-10-21 NOTE — Progress Notes (Signed)
PROGRESS NOTE Michael [name]  CMK:[id number] DOB: 07/30/1946 DOA: 10/14/2021 PCP: Lorrene Reid, PA-C   Brief Narrative/Hospital Course: 76 year old gentleman with medical history of CAD multiple vessel with CABG, PCI, carotid artery occlusion, hypertension, HLD, PAD, hypertension, COPD/chronic respiratory failure on 4 L nasal cannula, gout followed by Dr. Rush Landmark for abdominal pain, with H. pylori antigen positive treated and eradicated confirmed with EGD on 07/15/2019 has had abnormal LFTs for several months and recently worsened, underwent further work-up with CT scan of the abdomen pelvis and still jaundiced and directed to the ED for admission. In the ED, hemodynamically stable, on room air, labs shows creatinine at 1.5 alk phos 442 AST 132 ALT 144 total bili 4.2-Labs on 215 over alk phos 426, AST 180 ALT 197 and TB 7.1 GGT 330. GI was consulted s/p  MRCP - showed cholelithiasis and choledocholithiasis.  Monitor closely with serial LFTs, had leukocytosis placed on IV antibiotics with Zosyn, patient remains hospitalized for ERCP after Plavix washout on 10/20/21.-ERCP completed 2/27 after Plavix washout, complete removal of stone with biliary sphincterotomy and balloon extraction.    Subjective: Seen and examined this morning. Patient reports being sick and having abdominal pain, vomited last night, refusing to eat anything this morning. Lipase came back elevated 566.  Assessment and Plan: * Pain in the abdomen- (present on admission) Cholelithiasis and choledocholithiasis symptomatic Elevated LFTs due to choledocholithiasis Abdominal pain nausea and weight loss: MRCP:cholelithiasis and choledocholithiasis.Symptomatic w/ abdominal pain nausea, elevated LFTs- s/p Zosyn due to concern of infection given significant leukocytosis that has resolved-ERCP completed 2/27 after Plavix washout, complete removal of stone with biliary sphincterotomy and balloon extraction.  Okay to start diet resume  Plavix 2/28-but patient having increasing abdominal pain episode of vomiting-he blames it on ice cream.  Checking lipase for possible post ERCP pancreatitis-given elevated lipase of 566, GI changed to clear liquid diet, he is reluctant to eat.Continue supportive care pain control IV fluids antiemetics.  GI following  Cholelithiasis with choledocholithiasis- (present on admission) See #1  Abnormal LFTs- (present on admission) See above #1  Acute renal failure superimposed on stage 3a chronic kidney disease ([place]) Suspect patient presented with AKI due to his GI disease nausea vomiting.  Likely baseline creatinine around 1.1-1.2 with CKD stage II vs CKD3a given creatinine has improved to 1.1. monitor. Recent Labs  Lab 10/17/21 0446 10/18/21 0417 10/19/21 0502 10/20/21 0424 10/21/21 0420  BUN 16 13 12 10 16   CREATININE 1.15 1.27* 1.24 1.10 1.06    Chronic respiratory failure with hypoxia (HCC) - SaO2 drops to 87% with ambulation- (present on admission) See copd.  Chronic obstructive pulmonary disease (HCC)- (present on admission) COPD Chronic hypoxic respiratory failure on 4 L: COPD compensated continue home oxygen 4 L and inhalers.  Per PCP he declined seeing pulmonary for PFTs.  I-S OOB monitor perioperative complication    CAD, multiple vessel - status post CABG: LIMA-LAD, SVG-RPDA; PCI native RCA for 100% SVG-RCA- (present on admission) CAD with history of CABG and stent HLD PVD History of carotid stenosis: Patient without chest pain.  He is on metoprolol-resuming Plavix and statin.  Essential hypertension- (present on admission) BP stable on metoprolol  Hyperlipidemia with target LDL less than 70- (present on admission) See above.  Peripheral arterial occlusive disease ([place])- (present on admission) See above.  Choledocholithiasis See #1  Malnutrition of moderate degree- (present on admission) Dietitian on board, augment diet as below. Nutrition Problem: Moderate  Malnutrition Etiology: acute illness (cholelithiasis) Signs/Symptoms: mild fat depletion, mild muscle  depletion Interventions: Education    Anemia due to chronic kidney disease Stable  Hypothyroidism- (present on admission) Cont synthroid.   DVT prophylaxis: SCDs Start: 10/14/21 1628 off  Lovenox for ercp Code Status:   Code Status: Full Code Family Communication: plan of care discussed with patient/ at bedside. Disposition:Currently not medically stable for discharge. Status OJ:[id number]. The patient remains hospitalized due to ongoing nausea vomiting abdominal pain, discharge once cleared by GI.     Objective: Vitals last 24 hrs: Vitals:   10/20/21 1425 10/20/21 1430 10/21/21 0536 10/21/21 1213  BP:  130/60 136/84 114/87  Pulse: 66 66 60 61  Resp: (!) 23 13 15 16   Temp:   97.7 F (36.5 C) (!) 97.3 F (36.3 C)  TempSrc:    Oral  SpO2: 100% 100% 100% 96%  Weight:      Height:       Weight change:   Physical Examination: General exam: Aa0x3,older than stated age, weak appearing. HEENT:Oral mucosa moist, Ear/Nose WNL grossly, dentition normal. Respiratory system: bilaterally clear,no use of accessory muscle Cardiovascular system: S1 & S2 +, No JVD,. Gastrointestinal system: Abdomen soft, tender right upper quadrant,ND, BS+ Nervous System:Alert, awake, moving extremities and grossly nonfocal Extremities: edema neg,distal peripheral pulses palpable.  Skin: No rashes,no icterus. MSK: Normal muscle bulk,tone, power  Medications reviewed:  Scheduled Meds:  atorvastatin  20 mg Oral QHS   clopidogrel  75 mg Oral Daily   fenofibrate  54 mg Oral Daily   levothyroxine  75 mcg Oral Q0600   metoprolol tartrate  25 mg Oral BID   pantoprazole  40 mg Oral Daily   Continuous Infusions:  lactated ringers 50 mL/hr at 10/21/21 1147      Diet Order             Diet clear liquid Room service appropriate? Yes; Fluid consistency: Thin  Diet effective now                     Nutrition Problem: Moderate Malnutrition Etiology: acute illness (cholelithiasis) Signs/Symptoms: mild fat depletion, mild muscle depletion Interventions: Education   Intake/Output Summary (Last 24 hours) at 10/21/2021 1310 Last data filed at 10/21/2021 1116 Gross per 24 hour  Intake 2598.14 ml  Output 925 ml  Net 1673.14 ml   Net IO Since Admission: 7,413.25 mL [10/21/21 1310]  Wt Readings from Last 3 Encounters:  10/20/21 85.7 kg  07/14/21 90.5 kg  06/17/21 90.5 kg     Unresulted Labs (From admission, onward)     Start     Ordered   10/19/21 0500  Comprehensive metabolic panel  Daily,   R      10/18/21 0959   Signed and Held  CBC  Tomorrow morning,   R       Question:  Specimen collection method  Answer:  Lab=Lab collect   Signed and Held          Data Reviewed: I have personally reviewed following labs and imaging studies CBC: Recent Labs  Lab 10/14/21 1407 10/15/21 0444 10/16/21 1114 10/18/21 0417 10/19/21 0502 10/20/21 0424  WBC 7.8 7.5 19.6* 7.1 7.3 5.9  NEUTROABS 5.6  --  17.3*  --   --   --   HGB 12.2* 10.0* 9.9* 9.6* 9.2* 9.6*  HCT 37.1* 31.7* 29.8* 29.2* 28.2* 29.1*  MCV 98.1 101.6* 99.3 99.7 98.6 98.6  PLT 438* 361 345 347 354 993   Basic Metabolic Panel: Recent Labs  Lab 10/17/21 0446 10/18/21 0417  10/19/21 0502 10/20/21 0424 10/21/21 0420  NA 136 136 137 138 138  K 3.7 3.4* 3.8 3.7 4.9  CL 106 105 106 109 105  CO2 25 25 26 28 27   GLUCOSE 97 99 111* 90 126*  BUN 16 13 12 10 16   CREATININE 1.15 1.27* 1.24 1.10 1.06  CALCIUM 8.1* 7.9* 8.0* 8.3* 8.7*   GFR: Estimated Creatinine Clearance: 61 mL/min (by C-G formula based on SCr of 1.06 mg/dL). Liver Function Tests: Recent Labs  Lab 10/17/21 0446 10/18/21 0417 10/19/21 0502 10/20/21 0424 10/21/21 0420  AST 140* 117* 124* 110* 72*  ALT 125* 118* 113* 108* 93*  ALKPHOS 349* 306* 309* 280* 261*  BILITOT 6.4* 4.0* 3.6* 2.8* 2.6*  PROT 5.6* 5.6* 5.6* 5.7* 6.5  ALBUMIN 2.3* 2.2*  2.0* 2.1* 2.5*   Recent Labs  Lab 10/14/21 1407 10/21/21 0920  LIPASE 39 566*   No results for input(s): AMMONIA in the last 168 hours. Coagulation Profile: No results for input(s): INR, PROTIME in the last 168 hours. Cardiac Enzymes: No results for input(s): CKTOTAL, CKMB, CKMBINDEX, TROPONINI in the last 168 hours. BNP (last 3 results) No results for input(s): PROBNP in the last 8760 hours. HbA1C: No results for input(s): HGBA1C in the last 72 hours. CBG: No results for input(s): GLUCAP in the last 168 hours. Lipid Profile: No results for input(s): CHOL, HDL, LDLCALC, TRIG, CHOLHDL, LDLDIRECT in the last 72 hours. Thyroid Function Tests: No results for input(s): TSH, T4TOTAL, FREET4, T3FREE, THYROIDAB in the last 72 hours. Anemia Panel: No results for input(s): VITAMINB12, FOLATE, FERRITIN, TIBC, IRON, RETICCTPCT in the last 72 hours. Sepsis Labs: No results for input(s): PROCALCITON, LATICACIDVEN in the last 168 hours.  Recent Results (from the past 174 hour(s))  Helicobacter pylori special antigen     Status: None   Collection Time: 10/13/21 11:52 AM   Specimen: Blood  Result Value Ref Range Status   MICRO NUMBER: [id number]  Final   SPECIMEN QUALITY Adequate  Final   SOURCE: STOOL  Final   STATUS: FINAL  Final   RESULT:   Final    Not Detected  Antimicrobials, proton pump inhibitors, and bismuth preparations inhibit H. pylori and ingestion up to two weeks prior to testing may cause false negative results. If clinically indicated the test should be repeated on a new specimen  obtained two weeks after discontinuing treatment.     Comment: Reference Range:Not Detected  Resp Panel by RT-PCR (Flu A&B, Covid) Nasopharyngeal Swab     Status: None   Collection Time: 10/14/21  4:17 PM   Specimen: Nasopharyngeal Swab; Nasopharyngeal(NP) swabs in vial transport medium  Result Value Ref Range Status   SARS Coronavirus 2 by RT PCR NEGATIVE NEGATIVE Final    Comment:  (NOTE) SARS-CoV-2 target nucleic acids are NOT DETECTED.  The SARS-CoV-2 RNA is generally detectable in upper respiratory specimens during the acute phase of infection. The lowest concentration of SARS-CoV-2 viral copies this assay can detect is 138 copies/mL. A negative result does not preclude SARS-Cov-2 infection and should not be used as the sole basis for treatment or other patient management decisions. A negative result may occur with  improper specimen collection/handling, submission of specimen other than nasopharyngeal swab, presence of viral mutation(s) within the areas targeted by this assay, and inadequate number of viral copies(<138 copies/mL). A negative result must be combined with clinical observations, patient history, and epidemiological information. The expected result is Negative.  Fact Sheet for Patients:  EntrepreneurPulse.com.au  Fact Sheet for Healthcare Providers:  IncredibleEmployment.be  This test is no t yet approved or cleared by the [place] FDA and  has been authorized for detection and/or diagnosis of SARS-CoV-2 by FDA under an Emergency Use Authorization (EUA). This EUA will remain  in effect (meaning this test can be used) for the duration of the COVID-19 declaration under Section 564(b)(1) of the Act, 21 U.S.C.section 360bbb-3(b)(1), unless the authorization is terminated  or revoked sooner.       Influenza A by PCR NEGATIVE NEGATIVE Final   Influenza B by PCR NEGATIVE NEGATIVE Final    Comment: (NOTE) The Xpert Xpress SARS-CoV-2/FLU/RSV plus assay is intended as an aid in the diagnosis of influenza from Nasopharyngeal swab specimens and should not be used as a sole basis for treatment. Nasal washings and aspirates are unacceptable for Xpert Xpress SARS-CoV-2/FLU/RSV testing.  Fact Sheet for Patients: EntrepreneurPulse.com.au  Fact Sheet for Healthcare  Providers: IncredibleEmployment.be  This test is not yet approved or cleared by the [place] FDA and has been authorized for detection and/or diagnosis of SARS-CoV-2 by FDA under an Emergency Use Authorization (EUA). This EUA will remain in effect (meaning this test can be used) for the duration of the COVID-19 declaration under Section 564(b)(1) of the Act, 21 U.S.C. section 360bbb-3(b)(1), unless the authorization is terminated or revoked.  Performed at [facility], [place] [address] [place], [place] [address]     Antimicrobials: Anti-infectives (From admission, onward)    Start     Dose/Rate Route Frequency Ordered Stop   10/20/21 0945  Ampicillin-Sulbactam (UNASYN) 3 g in sodium chloride 0.9 % 100 mL IVPB        3 g 200 mL/hr over 30 Minutes Intravenous  Once 10/20/21 0850 10/20/21 1100   10/17/21 1400  piperacillin-tazobactam (ZOSYN) IVPB 3.375 g        3.375 g 12.5 mL/hr over 240 Minutes Intravenous Every 8 hours 10/17/21 1022 10/20/21 1903      Culture/Microbiology No results found for: SDES, [place], CULT, REPTSTATUS  Other culture-see note   Radiology Studies: DG ERCP  Result Date: 10/20/2021 CLINICAL DATA:  ERCP EXAM: ERCP TECHNIQUE: Multiple spot images obtained with the fluoroscopic device and submitted for interpretation post-procedure. FLUOROSCOPY: Refer to procedure report COMPARISON:  None. FINDINGS: A total of 17 fluoroscopic spot images taken during ERCP are submitted for review. Initial images demonstrate scope overlying the upper abdomen and wire catheterization of the CBD. The CBD appears somewhat dilated, although images are not calibrated for independent measurements. Balloon sweeps of the CBD or performed. Final images demonstrate withdrawal of scope and all instrumentation. IMPRESSION: ERCP images as described. Refer to procedure report for full details. These images were submitted for radiologic interpretation  only. Please see the procedural report for the amount of contrast and the fluoroscopy time utilized. Electronically Signed   By: Albin Felling M.D.   On: 10/20/2021 14:58     LOS: 5 days   Antonieta Pert, MD [facility]  10/21/2021, 1:10 PM

## 2021-10-21 NOTE — Progress Notes (Signed)
°   10/21/21 1400  Oxygen Therapy  SpO2 96 %  O2 Device Nasal Cannula  O2 Flow Rate (L/min) 3 L/min  Patient Activity (if Appropriate) Ambulating  Mobility  Activity Ambulated with assistance in hallway  Level of Assistance Modified independent, requires aide device or extra time  Assistive Device None  Distance Ambulated (ft) 200 ft  Activity Response Tolerated well  $Mobility charge 1 Mobility   Pt agreeable to mobilize this afternoon. Ambulated about 265ft in hall with no AD, tolerated well. RN requested that pt ambulate with no O2 for rest of session. Pt ambulated about 195ft, O2 sat at 92%. No complaints. Left pt in bed, call bell at side. RN/NT notified of session.   Tim[facility]o [employer] Mobility Specialist Acute Rehab Services Office: [phone]

## 2021-10-21 NOTE — Evaluation (Signed)
Physical Therapy Evaluation Patient Details Name: Michael [name] MRN: [id number] DOB: 1946/04/06 Today's Date: 10/21/2021  History of Present Illness  76 year old male presents for elevated LFTs and bili on 10/08/21 followed by CT abdomen/pelvis done yesterday with report of chronic abdominal pain (onset 07/2019), although not having any pain at this time. Pt s/p ERCP with biliary sphincterotomy and bile duct stone removal on 2/27. PMH: CAD, CKD, COPD (on 4L [place] at baseline), NSTEMI, HTN   Clinical Impression  Pt admitted with above diagnosis. Pt from home alone, lives in single story home with 3 steps to enter, on 4L O2 24/7, prepares sandwiches or eats out for meals, daughter reports pt has "good and bad days" in regards to his breathing and the amount of rest breaks he needs. Pt stands and ambulates without AD, good steadiness noted, requires 3 standing rest breaks due to fatigue with dyspnea 2/4 while ambulating, able to continue conversation. Pt with abdominal pain that is constant and doesn't increase with mobility. Educated pt on time OOB and ambulating multiple times daily with nursing and pt verbalizes understanding. Pt motivated to return home asap, all questions answered. Pt returns to supine for EKG with RN and NT at EOS. Pt currently with functional limitations due to the deficits listed below (see PT Problem List). Pt will benefit from skilled PT to increase their independence and safety with mobility to allow discharge to the venue listed below.          Recommendations for follow up therapy are one component of a multi-disciplinary discharge planning process, led by the attending physician.  Recommendations may be updated based on patient status, additional functional criteria and insurance authorization.  Follow Up Recommendations No PT follow up    Assistance Recommended at Discharge PRN  Patient can return home with the following  Assistance with cooking/housework    Equipment  Recommendations None recommended by PT  Recommendations for Other Services       Functional Status Assessment Patient has had a recent decline in their functional status and demonstrates the ability to make significant improvements in function in a reasonable and predictable amount of time.     Precautions / Restrictions Precautions Precautions: Fall Restrictions Weight Bearing Restrictions: No      Mobility  Bed Mobility Overal bed mobility: Modified Independent     Transfers Overall transfer level: Needs assistance Equipment used: None Transfers: Sit to/from Stand Sit to Stand: Supervision  General transfer comment: powers to stand without assist or AD, good steadiness, therapist managing O2 line and IV line    Ambulation/Gait Ambulation/Gait assistance: Supervision Gait Distance (Feet): 150 Feet (3 standing rest breaks) Assistive device: None Gait Pattern/deviations: Step-through pattern, Decreased stride length Gait velocity: decreased  General Gait Details: pt ambulates with step through pattern, good steadiness, fatigues requiring 3 standing rest breaks to recover, able to continue conversation with dyspnea 2/4 on 4L O2  Stairs            Wheelchair Mobility    Modified Rankin (Stroke Patients Only)       Balance Overall balance assessment: No apparent balance deficits (not formally assessed)        Pertinent Vitals/Pain Pain Assessment Pain Assessment: Faces Pain Score:  ("less than 5") Faces Pain Scale: Hurts a little bit Pain Location: abdomen Pain Descriptors / Indicators: Discomfort Pain Intervention(s): Limited activity within patient's tolerance, Monitored during session, Repositioned    Home Living Family/patient expects to be discharged to:: Private residence Living Arrangements:  Alone Available Help at Discharge: Family;Available PRN/intermittently Type of Home: House Home Access: Stairs to enter Entrance Stairs-Rails:  Right;Left;Can reach both Entrance Stairs-Number of Steps: 3   Home Layout: One level Home Equipment: Other (comment) (Oxygen)      Prior Function Prior Level of Function : Independent/Modified Independent;Driving  Mobility Comments: pt reports ind with community ambulation without AD, has handheld portable O2 tank for when he leaves home ADLs Comments: pt reports ind with self care, simple meal prep (makes sandwiches), eats out at restaurants, minimal house cleaning     Hand Dominance        Extremity/Trunk Assessment   Upper Extremity Assessment Upper Extremity Assessment: Overall WFL for tasks assessed    Lower Extremity Assessment Lower Extremity Assessment: Overall WFL for tasks assessed    Cervical / Trunk Assessment Cervical / Trunk Assessment: Normal  Communication   Communication: No difficulties  Cognition Arousal/Alertness: Awake/alert Behavior During Therapy: WFL for tasks assessed/performed Overall Cognitive Status: Within Functional Limits for tasks assessed     General Comments      Exercises     Assessment/Plan    PT Assessment Patient needs continued PT services  PT Problem List Decreased strength;Decreased activity tolerance;Decreased knowledge of use of DME;Cardiopulmonary status limiting activity;Pain       PT Treatment Interventions DME instruction;Gait training;Stair training;Functional mobility training;Therapeutic activities;Therapeutic exercise;Balance training;Patient/family education    PT Goals (Current goals can be found in the Care Plan section)  Acute Rehab PT Goals Patient Stated Goal: "get out of here today" PT Goal Formulation: With patient Time For Goal Achievement: 11/04/21 Potential to Achieve Goals: Good    Frequency Min 3X/week     Co-evaluation               AM-PAC PT "6 Clicks" Mobility  Outcome Measure Help needed turning from your back to your side while in a flat bed without using bedrails?: None Help  needed moving from lying on your back to sitting on the side of a flat bed without using bedrails?: None Help needed moving to and from a bed to a chair (including a wheelchair)?: A Little Help needed standing up from a chair using your arms (e.g., wheelchair or bedside chair)?: A Little Help needed to walk in hospital room?: A Little Help needed climbing 3-5 steps with a railing? : A Little 6 Click Score: 20    End of Session Equipment Utilized During Treatment: Gait belt;Oxygen Activity Tolerance: Patient tolerated treatment well Patient left: in bed;with call bell/phone within reach;with nursing/sitter in room;with family/visitor present Nurse Communication: Mobility status PT Visit Diagnosis: Other abnormalities of gait and mobility (R26.89);Pain Pain - part of body:  (abdominal)    Time: 3329-5188 PT Time Calculation (min) (ACUTE ONLY): 26 min   Charges:   PT Evaluation $PT Eval Low Complexity: 1 Low PT Treatments $Gait Training: 8-22 mins         Tori Alyha Marines PT, DPT 10/21/21, 12:02 PM

## 2021-10-21 NOTE — Progress Notes (Signed)
NP Olena Heckle was messaged because is complaining of pain.

## 2021-10-22 ENCOUNTER — Telehealth: Payer: Self-pay

## 2021-10-22 DIAGNOSIS — R7989 Other specified abnormal findings of blood chemistry: Secondary | ICD-10-CM

## 2021-10-22 DIAGNOSIS — J439 Emphysema, unspecified: Secondary | ICD-10-CM

## 2021-10-22 DIAGNOSIS — I25118 Atherosclerotic heart disease of native coronary artery with other forms of angina pectoris: Secondary | ICD-10-CM

## 2021-10-22 DIAGNOSIS — K8050 Calculus of bile duct without cholangitis or cholecystitis without obstruction: Secondary | ICD-10-CM

## 2021-10-22 LAB — COMPREHENSIVE METABOLIC PANEL
ALT: 74 U/L — ABNORMAL HIGH (ref 0–44)
AST: 53 U/L — ABNORMAL HIGH (ref 15–41)
Albumin: 2.5 g/dL — ABNORMAL LOW (ref 3.5–5.0)
Alkaline Phosphatase: 246 U/L — ABNORMAL HIGH (ref 38–126)
Anion gap: 5 (ref 5–15)
BUN: 15 mg/dL (ref 8–23)
CO2: 28 mmol/L (ref 22–32)
Calcium: 8.4 mg/dL — ABNORMAL LOW (ref 8.9–10.3)
Chloride: 101 mmol/L (ref 98–111)
Creatinine, Ser: 0.98 mg/dL (ref 0.61–1.24)
GFR, Estimated: >60 mL/min (ref >60)
Glucose, Bld: 104 mg/dL — ABNORMAL HIGH (ref 70–99)
Potassium: 4.3 mmol/L (ref 3.5–5.1)
Sodium: 134 mmol/L — ABNORMAL LOW (ref 135–145)
Total Bilirubin: 2.6 mg/dL — ABNORMAL HIGH (ref 0.3–1.2)
Total Protein: 6.2 g/dL — ABNORMAL LOW (ref 6.5–8.1)

## 2021-10-22 LAB — LIPASE, BLOOD: Lipase: 92 U/L — ABNORMAL HIGH (ref 11–51)

## 2021-10-22 MED ORDER — CLOPIDOGREL BISULFATE 75 MG PO TABS: 75.0000 mg | ORAL_TABLET | Freq: Every day | ORAL | 0 refills | Status: AC

## 2021-10-22 MED ORDER — ADULT MULTIVITAMIN W/MINERALS CH: 1.0000 | ORAL_TABLET | Freq: Every day | ORAL | Status: AC

## 2021-10-22 MED ORDER — BOOST / RESOURCE BREEZE PO LIQD CUSTOM
1.0000 | Freq: Three times a day (TID) | ORAL | Status: DC
  Administered 2021-10-22: 1 via ORAL

## 2021-10-22 MED ORDER — ADULT MULTIVITAMIN W/MINERALS CH
1.0000 | ORAL_TABLET | Freq: Every day | ORAL | Status: DC
Start: 2021-10-22 — End: 2021-10-22
  Administered 2021-10-22: 1 via ORAL
  Filled 2021-10-22: qty 1

## 2021-10-22 MED ORDER — POLYETHYLENE GLYCOL 3350 17 G PO PACK: 17.0000 g | PACK | Freq: Every day | ORAL | 0 refills | Status: AC

## 2021-10-22 NOTE — Telephone Encounter (Signed)
Lab order in epic.  ?Lab reminder sent to Joselyn Glassman, RN.  ?

## 2021-10-22 NOTE — Progress Notes (Signed)
? ? Progress Note ? ? Subjective  ?Chief Complaint: Choledocholithiasis status post ERCP with biliary sphincterotomy and bile duct stone removal 2/27, presumed postoperative pancreatitis ? ?Today, the patient tells me that he does feel some better in regards to his upper abdominal pain, but did not really try to eat much at all yesterday.  Apparently took some sips of his MiraLAX, but tells me he was nervous that he would start with pain and so he did not drink very much, does have some lower abdominal discomfort.  No further nausea or vomiting. ? ? Objective  ? ?Vital signs in last 24 hours: ?Temp:  [97.3 ?F (36.3 ?C)-98.6 ?F (37 ?C)] 98.6 ?F (37 ?C) (03/01 0520) ?Pulse Rate:  [61-69] 69 (03/01 0520) ?Resp:  [16] 16 (03/01 0520) ?BP: (114-130)/(62-87) 130/79 (03/01 0520) ?SpO2:  [96 %-100 %] 100 % (03/01 0520) ?Last BM Date : 10/19/21 ?General:    white male in NAD ?Heart:  Regular rate and rhythm; no murmurs ?Lungs: Respirations even and unlabored, lungs CTA bilaterally ?Abdomen:  Soft, mild epigastric ttp and nondistended. Normal bowel sounds. ?Psych:  Cooperative. Normal mood and affect. ? ?Intake/Output from previous day: ?02/28 0701 - 03/01 0700 ?In: 2806.1 [P.O.:720; I.V.:2086.1] ?Out: 1125 [Urine:1125] ?Intake/Output this shift: ?Total I/O ?In: 300 [P.O.:120; I.V.:180] ?Out: 200 [Urine:200] ? ?Lab Results: ?Recent Labs  ?  10/20/21 ?0424  ?WBC 5.9  ?HGB 9.6*  ?HCT 29.1*  ?PLT 350  ? ?BMET ?Recent Labs  ?  10/20/21 ?0424 10/21/21 ?0420 10/22/21 ?0416  ?NA 138 138 134*  ?K 3.7 4.9 4.3  ?CL 109 105 101  ?CO2 28 27 28   ?GLUCOSE 90 126* 104*  ?BUN 10 16 15   ?CREATININE 1.10 1.06 0.98  ?CALCIUM 8.3* 8.7* 8.4*  ? ?LFT ?Recent Labs  ?  10/22/21 ?0416  ?PROT 6.2*  ?ALBUMIN 2.5*  ?AST 53*  ?ALT 74*  ?ALKPHOS 246*  ?BILITOT 2.6*  ? ?Studies/Results: ?DG ERCP ? ?Result Date: 10/20/2021 ?CLINICAL DATA:  ERCP EXAM: ERCP TECHNIQUE: Multiple spot images obtained with the fluoroscopic device and submitted for interpretation  post-procedure. FLUOROSCOPY: Refer to procedure report COMPARISON:  None. FINDINGS: A total of 17 fluoroscopic spot images taken during ERCP are submitted for review. Initial images demonstrate scope overlying the upper abdomen and wire catheterization of the CBD. The CBD appears somewhat dilated, although images are not calibrated for independent measurements. Balloon sweeps of the CBD or performed. Final images demonstrate withdrawal of scope and all instrumentation. IMPRESSION: ERCP images as described. Refer to procedure report for full details. These images were submitted for radiologic interpretation only. Please see the procedural report for the amount of contrast and the fluoroscopy time utilized. Electronically Signed   By: Albin Felling M.D.   On: 10/20/2021 14:58   ? ? ? Assessment / Plan:   ?Assessment: ?1.  Choledocholithiasis: Because of recurrent upper abdominal pain and jaundice, now status post ERCP 2/27 with LFTs trending down, increased epigastric pain overnight on 10/20/2021 and lipase elevated in the 500s, presumed post ERCP pancreatitis, pain is improved overnight but patient has not eaten anything ?2.  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 ?Plan: ?1.  Continue increased fluids ?2.  Lipase is trended down this morning and pain seems better ?3.  Encouraged the patient to try his low-fat diet today at least a little bit of foods that we know he can eat before being discharged ?4.  Again patient will  need LFTs rechecked in a week at our office, I have gone ahead and set this up for him, lab visit should be listed on his discharge paperwork ? ?We will likely sign off today, please let us know we can be of any further assistance. ? ? LOS: 6 days  ? ?[name] [facility]  10/22/2021, 10:17 AM ? ?  ?

## 2021-10-22 NOTE — Discharge Instructions (Signed)
Michael [name], ? ?You were in the hospital with a stone in your biliary system. ?

## 2021-10-22 NOTE — Progress Notes (Signed)
Nutrition Follow-up ? ?DOCUMENTATION CODES:  ? ?Non-severe (moderate) malnutrition in context of acute illness/injury ? ?INTERVENTION:  ? ?-Boost Breeze po TID, each supplement provides 250 kcal and 9 grams of protein ? ?-Multivitamin with minerals daily ? ? ?NUTRITION DIAGNOSIS:  ? ?Moderate Malnutrition related to acute illness (cholelithiasis) as evidenced by mild fat depletion, mild muscle depletion. ? ?Ongoing. ? ?GOAL:  ? ?Patient will meet greater than or equal to 90% of their needs ? ?Progressing. ? ?MONITOR:  ? ?PO intake, Labs, Weight trends, I & O's, Supplement acceptance ? ?ASSESSMENT:  ? ?76 year old gentleman with medical history of CAD multiple vessel with CABG, PCI, carotid artery occlusion, hypertension, HLD, PAD, hypertension, COPD/chronic respiratory failure on 4 L nasal cannula, gout followed by Dr. Meridee Score for abdominal pain, with H. pylori antigen positive treated and eradicated confirmed with EGD on 07/15/2019 has had abnormal LFTs for several months and recently worsened. ? ?2/27: s/p ERCP, bile duct stone removal ? ?Patient currently consuming 25-50% of meals today. Has not eaten much since ERCP d/t increased abdominal pain and N/V. ?Pt now on soft diet. Have added Boost Breeze for additonal kcals and protein. ? ?Admission weight: 188 lbs. ?No other weights this admission. ? ?Medications: Miralax, Lactated ringers ? ?Labs reviewed: ? Low Na ? ?Diet Order:   ?Diet Order   ? ?       ?  DIET SOFT Room service appropriate? Yes; Fluid consistency: Thin  Diet effective now       ?  ? ?  ?  ? ?  ? ? ?EDUCATION NEEDS:  ? ?Education needs have been addressed ? ?Skin:  Skin Assessment: Reviewed RN Assessment ? ?Last BM:  2/26 ? ?Height:  ? ?Ht Readings from Last 1 Encounters:  ?10/20/21 5\' 7"  (1.702 m)  ? ? ?Weight:  ? ?Wt Readings from Last 1 Encounters:  ?10/20/21 85.7 kg  ? ? ?BMI:  Body mass index is 29.59 kg/m?. ? ?Estimated Nutritional Needs:  ? ?Kcal:  1700-1900 ? ?Protein:  75-85g ? ?Fluid:   1.9L/day ? ? ?10/22/21, MS, RD, LDN ?Inpatient Clinical Dietitian ?Contact information available via Amion ? ?

## 2021-10-22 NOTE — Discharge Summary (Signed)
Physician Discharge Summary   Patient: Michael [name] MRN: [id number] DOB: 1946-01-04  Admit date:     10/14/2021  Discharge date: 10/22/21  Discharge Physician: Cordelia Poche, MD   PCP: Lorrene Reid, PA-C   Recommendations at discharge:   Patient will need cardiology and pulmonology follow-up for preoperative risk assessment for consideration of elective cholecystectomy Outpatient GI follow-up in 1 week for repeat CMP/LFTs  Discharge Diagnoses: Principal Problem:   Pain in the abdomen Active Problems:   Chronic obstructive pulmonary disease (HCC)   CAD, multiple vessel - status post CABG: LIMA-LAD, SVG-RPDA; PCI native RCA for 100% SVG-RCA   Essential hypertension   Hypothyroidism   Peripheral arterial occlusive disease (HCC)   Hyperlipidemia with target LDL less than 70   Chronic respiratory failure with hypoxia (HCC) - SaO2 drops to 87% with ambulation   Abnormal LFTs   Acute renal failure superimposed on stage 3a chronic kidney disease ([place])   Cholelithiasis with choledocholithiasis   Anemia due to chronic kidney disease   Malnutrition of moderate degree   Post-ERCP acute pancreatitis  Resolved Problems:   * No resolved hospital problems. *   Hospital Course: 76 year old gentleman with medical history of CAD multiple vessel with CABG, PCI, carotid artery occlusion, hypertension, HLD, PAD, hypertension, COPD/chronic respiratory failure on 4 L nasal cannula, gout followed by Dr. Rush Landmark for abdominal pain, with H. pylori antigen positive treated and eradicated confirmed with EGD on 07/15/2019 has had abnormal LFTs for several months and recently worsened, underwent further work-up with CT scan of the abdomen pelvis and still jaundiced and directed to the ED for admission. In the ED, hemodynamically stable, on room air, labs shows creatinine at 1.5 alk phos 442 AST 132 ALT 144 total bili 4.2-Labs on 215 over alk phos 426, AST 180 ALT 197 and TB 7.1 GGT 330. GI was consulted  s/p  MRCP - showed cholelithiasis and choledocholithiasis.  Monitor closely with serial LFTs, had leukocytosis placed on IV antibiotics with Zosyn, patient remains hospitalized for ERCP after Plavix washout on 10/20/21.-ERCP completed 2/27 after Plavix washout, complete removal of stone with biliary sphincterotomy and balloon extraction.  Assessment and Plan: * Pain in the abdomen Cholelithiasis and choledocholithiasis symptomatic Elevated LFTs due to choledocholithiasis Abdominal pain nausea and weight loss: MRCP:cholelithiasis and choledocholithiasis.Symptomatic w/ abdominal pain nausea, elevated LFTs- s/p Zosyn due to concern of infection given significant leukocytosis that has resolved-ERCP completed 2/27 after Plavix washout, complete removal of stone with biliary sphincterotomy and balloon extraction. No stent was able to be placed. Patient with post-ERCP pancreatitis which quickly resolved. Recommend General surgery follow-up for elective cholecystectomy but patient will need risk assessment by cardiology and pulmonology services.  Chronic respiratory failure with hypoxia (HCC) - SaO2 drops to 87% with ambulation Stable.  Peripheral arterial occlusive disease ([place]) See above.  Choledocholithiasis See #1  Malnutrition of moderate degree Dietitian on board, augment diet as below. Nutrition Problem: Moderate Malnutrition Etiology: acute illness (cholelithiasis) Signs/Symptoms: mild fat depletion, mild muscle depletion Interventions: Education    Anemia due to chronic kidney disease Stable  Cholelithiasis with choledocholithiasis See #1  Acute renal failure superimposed on stage 3a chronic kidney disease ([place]) Suspect patient presented with AKI due to his GI disease nausea vomiting.  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  Abnormal LFTs See above #1  Hyperlipidemia with target LDL less than  70 Continue fenofibrate  Hypothyroidism Cont synthroid.  Essential hypertension BP  stable on metoprolol  CAD, multiple vessel - status post CABG: LIMA-LAD, SVG-RPDA; PCI native RCA for 100% SVG-RCA CAD with history of CABG and stent HLD PVD History of carotid stenosis: Patient without chest pain.  He is on metoprolol-resuming Plavix and statin.  Chronic obstructive pulmonary disease (HCC) COPD Chronic hypoxic respiratory failure on 4 L: COPD compensated continue home oxygen 4 L and inhalers.  Per PCP he declined seeing pulmonary for PFTs.  I-S OOB monitor perioperative complication             Consultants: Gastroenterology Procedures performed: ERCP  Disposition: Home Diet recommendation:  Cardiac diet  DISCHARGE MEDICATION: Allergies as of 10/22/2021   No Known Allergies      Medication List     TAKE these medications    ALLERGY PO Take 2 tablets by mouth daily.   atorvastatin 20 MG tablet Commonly known as: LIPITOR Take 1 tablet (20 mg total) by mouth at bedtime.   clopidogrel 75 MG tablet Commonly known as: PLAVIX Take 1 tablet (75 mg total) by mouth daily.   esomeprazole 40 MG capsule Commonly known as: NexIUM Take 1 capsule (40 mg total) by mouth daily.   fenofibrate 48 MG tablet Commonly known as: TRICOR Take 1 tablet (48 mg total) by mouth daily.   levothyroxine 75 MCG tablet Commonly known as: SYNTHROID TAKE 1 TABLET BY MOUTH DAILY   metoprolol tartrate 25 MG tablet Commonly known as: LOPRESSOR Take 1 tablet (25 mg total) by mouth 2 (two) times daily.   multivitamin with minerals Tabs tablet Take 1 tablet by mouth daily. Start taking on: October 23, 2021   nitroGLYCERIN 0.4 MG SL tablet Commonly known as: Nitrostat Place 1 tablet (0.4 mg total) under the tongue every 5 (five) minutes as needed. After 3 doses, if no relief dial 911   OXYGEN Inhale 5 L into the lungs continuous.   polyethylene glycol 17 g packet Commonly known as:  MIRALAX / GLYCOLAX Take 17 g by mouth daily. Start taking on: October 23, 2021   Vitamin D (Ergocalciferol) 1.25 MG (50000 UNIT) Caps capsule Commonly known as: DRISDOL TAKE 1 CAPSULE BY MOUTH EVERY 7 DAYS        Follow-up Information     Lorrene Reid, PA-C Follow up in 1 week(s).   Specialty: Physician Assistant Contact information: [place] [address] [phone]         Gatha Mayer, MD Follow up in 1 week(s).   Specialty: Gastroenterology Why: for lft check Contact information: [address] [place] [address] [phone]         Leonie Man, MD Follow up.   Specialty: Cardiology Why: Preoperative risk assessment Contact information: [place] [address] [facility], [place]. Call.   Specialty: General Surgery Why: Call after you see your primary care physician/ cardiologist to discuss gallbladder surgery if you are cleared for surgery Contact information: [address] [place] [phone]        Tanda Rockers, MD Follow up.   Specialty: Pulmonary Disease Why: Preoperative risk assessment Contact information: [address] [place] [address] [phone]                 Discharge Exam: Danley Danker Weights   10/14/21 1737 10/20/21 1143  Weight: 85.7 kg 85.7 kg   General: Well appearing,  no distress  Condition at discharge: stable  The results of significant diagnostics from this hospitalization (including imaging, microbiology, ancillary and laboratory) are listed below for reference.   Imaging Studies: CT ABDOMEN PELVIS W CONTRAST  Result Date: 10/14/2021 CLINICAL DATA:  Abdominal pain, jaundice and abnormal LFTs. EXAM: CT ABDOMEN AND PELVIS WITH CONTRAST TECHNIQUE: Multidetector CT imaging of the abdomen and pelvis was performed using the standard protocol following  bolus administration of intravenous contrast. RADIATION DOSE REDUCTION: This exam was performed according to the departmental dose-optimization program which includes automated exposure control, adjustment of the mA and/or kV according to patient size and/or use of iterative reconstruction technique. CONTRAST:  14mL OMNIPAQUE IOHEXOL 300 MG/ML  SOLN COMPARISON:  CTA chest, abdomen and pelvis 08/03/2020 FINDINGS: Lower chest: No acute abnormality. Emphysematous changes with subpleural fibrosis and honeycombing are again noted, with small chronic loculated posterior basal left pleural fluid collection and chronic lower lobe posterior atelectasis greater on the left. The heart is slightly enlarged. There is coronary artery calcification and old CABG. Hepatobiliary: The liver is normal in size with mild steatosis seen. No liver mass is evident. There are small capsular calcifications again noted posteriorly. There is no intrahepatic biliary prominence but is extrahepatic biliary dilatation with the common bile duct 9 mm. There is no visible ductal filling defect. Gallbladder partially contracted but no wall thickening, stones or adjacent edema noted. Pancreas: No ductal dilatation or mass is seen. The pancreas partially fatty atrophic. No adjacent edema. Spleen: No mass or splenomegaly. Adrenals/Urinary Tract: There is no adrenal mass. Congenital malrotation again noted left kidney. Small 6 mm stable hypodensity in the posterior right kidney is unchanged but too small to characterize. There are no stones or hydronephrosis. Both kidneys slightly small in length, both measuring 8.9 cm length. There is cortical thinning. There is no bladder thickening. Stomach/Bowel: Small hiatal hernia. There is no dilatation or wall thickening including the appendix. Left colonic diverticulosis is seen without evidence of colitis or diverticulitis most advanced diverticular disease in the sigmoid segment. Vascular/Lymphatic: There is  heavy aortoiliac calcific plaque. 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 No enlarged lymph nodes are seen. Reproductive: There are calcifications of the prostate but no prostatomegaly. Other: Small umbilical hernia. Small left inguinal fat hernia. No free air, fluid or hemorrhage. Musculoskeletal: There are degenerative changes in the thoracic and lumbar spine. Advanced degenerative disc change with spondylosis and facet hypertrophy at L5-S1 is associated with severe foraminal stenosis. There is mild hip DJD. IMPRESSION: 1. Increasingly prominent common bile duct without a visible filling defect or pancreatic head mass. MRCP may be helpful for further evaluation. 2. Mild hepatic steatosis.  No mass enhancement. 3. Aortic atherosclerosis, with chronically occluded right common iliac artery. 4. Diverticulosis without evidence of acute diverticulitis. 5. Umbilical and left inguinal fat hernias. 6. Emphysematous and chronic change. 7. Additional findings as described above. Electronically Signed   By: Telford Nab M.D.   On: 10/14/2021 01:19   MR 3D Recon At Scanner  Result Date: 10/15/2021 CLINICAL DATA:  76 year old male with history of elevated liver function tests. Jaundice. EXAM: MRI ABDOMEN WITH CONTRAST (WITH MRCP) TECHNIQUE: Multiplanar multisequence MR imaging of the abdomen was performed following the administration of intravenous contrast. Heavily T2-weighted images of the biliary and pancreatic ducts were obtained, and three-dimensional MRCP images were rendered by post processing. CONTRAST:  64mL GADAVIST GADOBUTROL 1 MMOL/ML IV SOLN COMPARISON:  No prior abdominal MRI. Abdominal ultrasound 10/14/2021. CT the abdomen and pelvis 10/13/2021. FINDINGS: Comment: Portions of today's examination are limited by considerable patient respiratory motion. Lower chest: Signal in the dependent  portion of the left lower lobe, likely reflective of subsegmental atelectasis, adjacent to a trace left pleural effusion, but poorly evaluated on today's magnetic resonance examination. Signal void in the lower sternum, presumably from median sternotomy wires. Hepatobiliary: No suspicious cystic or solid hepatic lesions. MRCP images demonstrates some very mild intrahepatic biliary ductal dilatation. Common bile duct is dilated up to 11 mm in the porta hepatis. Gallbladder is nearly completely contracted. Small filling defects are evident in the gallbladder, indicative of tiny gallstones. No pericholecystic fluid. Gallbladder wall does not appear thickened. In the distal common bile duct there are some small filling defects concerning for choledocholithiasis. The largest of these measures up to 7 mm in diameter immediately before the level of the ampulla. Pancreas: There is some subtle focal enhancement at the level of the ampulla (axial image 64 of series 22), which may be related to indwelling stones. This is not mass-like in appearance, and measures only approximately 4 mm. No other potential pancreatic mass. No pancreatic ductal dilatation noted on MRCP images. No peripancreatic fluid collections or inflammatory changes. Spleen:  Unremarkable. Adrenals/Urinary Tract: Small T1 hypointense, T2 hyperintense, nonenhancing lesions in both kidneys, compatible with tiny simple cysts. No hydroureteronephrosis in the visualized portions of the abdomen. Bilateral adrenal glands are normal in appearance. Stomach/Bowel: Unremarkable. Vascular/Lymphatic: Aortic atherosclerosis. No aneurysm identified in the visualized abdominal vasculature. No lymphadenopathy noted in the abdomen. Other: No significant volume of ascites noted in the visualized portions of the peritoneal cavity. Musculoskeletal: No aggressive appearing osseous lesions are noted in the visualized portions of the skeleton. IMPRESSION: 1. Study is positive for  cholelithiasis in addition to choledocholithiasis. There are several small stones in the distal common bile duct extending to the level of the ampulla. Additionally, at the level of the ampulla there is some very subtle focal enhancement. The possibility of an ampullary mass is not excluded, but not strongly favored (this enhancement may simply be inflammatory in the setting of indwelling stones impacted in the distal common bile duct). At this time, this is associated with only mild proximal intra and extrahepatic biliary ductal dilatation. Further evaluation with ERCP and endoscopic ultrasound should be considered if clinically appropriate. 2. Trace left pleural effusion with probable subsegmental atelectasis in the left lower lobe. 3. Aortic atherosclerosis. Electronically Signed   By: Vinnie Langton M.D.   On: 10/15/2021 05:20   DG ERCP  Result Date: 10/20/2021 CLINICAL DATA:  ERCP EXAM: ERCP TECHNIQUE: Multiple spot images obtained with the fluoroscopic device and submitted for interpretation post-procedure. FLUOROSCOPY: Refer to procedure report COMPARISON:  None. FINDINGS: A total of 17 fluoroscopic spot images taken during ERCP are submitted for review. Initial images demonstrate scope overlying the upper abdomen and wire catheterization of the CBD. The CBD appears somewhat dilated, although images are not calibrated for independent measurements. Balloon sweeps of the CBD or performed. Final images demonstrate withdrawal of scope and all instrumentation. IMPRESSION: ERCP images as described. Refer to procedure report for full details. These images were submitted for radiologic interpretation only. Please see the procedural report for the amount of contrast and the fluoroscopy time utilized. Electronically Signed   By: Albin Felling M.D.   On: 10/20/2021 14:58   MR ABDOMEN MRCP W WO CONTAST  Result Date: 10/15/2021 CLINICAL DATA:  76 year old male with  history of elevated liver function tests.  Jaundice. EXAM: MRI ABDOMEN WITH CONTRAST (WITH MRCP) TECHNIQUE: Multiplanar multisequence MR imaging of the abdomen was performed following the administration of intravenous contrast. Heavily T2-weighted images of the biliary and pancreatic ducts were obtained, and three-dimensional MRCP images were rendered by post processing. CONTRAST:  54m GADAVIST GADOBUTROL 1 MMOL/ML IV SOLN COMPARISON:  No prior abdominal MRI. Abdominal ultrasound 10/14/2021. CT the abdomen and pelvis 10/13/2021. FINDINGS: Comment: Portions of today's examination are limited by considerable patient respiratory motion. Lower chest: Signal in the dependent portion of the left lower lobe, likely reflective of subsegmental atelectasis, adjacent to a trace left pleural effusion, but poorly evaluated on today's magnetic resonance examination. Signal void in the lower sternum, presumably from median sternotomy wires. Hepatobiliary: No suspicious cystic or solid hepatic lesions. MRCP images demonstrates some very mild intrahepatic biliary ductal dilatation. Common bile duct is dilated up to 11 mm in the porta hepatis. Gallbladder is nearly completely contracted. Small filling defects are evident in the gallbladder, indicative of tiny gallstones. No pericholecystic fluid. Gallbladder wall does not appear thickened. In the distal common bile duct there are some small filling defects concerning for choledocholithiasis. The largest of these measures up to 7 mm in diameter immediately before the level of the ampulla. Pancreas: There is some subtle focal enhancement at the level of the ampulla (axial image 64 of series 22), which may be related to indwelling stones. This is not mass-like in appearance, and measures only approximately 4 mm. No other potential pancreatic mass. No pancreatic ductal dilatation noted on MRCP images. No peripancreatic fluid collections or inflammatory changes. Spleen:  Unremarkable. Adrenals/Urinary Tract: Small T1 hypointense,  T2 hyperintense, nonenhancing lesions in both kidneys, compatible with tiny simple cysts. No hydroureteronephrosis in the visualized portions of the abdomen. Bilateral adrenal glands are normal in appearance. Stomach/Bowel: Unremarkable. Vascular/Lymphatic: Aortic atherosclerosis. No aneurysm identified in the visualized abdominal vasculature. No lymphadenopathy noted in the abdomen. Other: No significant volume of ascites noted in the visualized portions of the peritoneal cavity. Musculoskeletal: No aggressive appearing osseous lesions are noted in the visualized portions of the skeleton. IMPRESSION: 1. Study is positive for cholelithiasis in addition to choledocholithiasis. There are several small stones in the distal common bile duct extending to the level of the ampulla. Additionally, at the level of the ampulla there is some very subtle focal enhancement. The possibility of an ampullary mass is not excluded, but not strongly favored (this enhancement may simply be inflammatory in the setting of indwelling stones impacted in the distal common bile duct). At this time, this is associated with only mild proximal intra and extrahepatic biliary ductal dilatation. Further evaluation with ERCP and endoscopic ultrasound should be considered if clinically appropriate. 2. Trace left pleural effusion with probable subsegmental atelectasis in the left lower lobe. 3. Aortic atherosclerosis. Electronically Signed   By: DVinnie LangtonM.D.   On: 10/15/2021 05:20   UK[place]Abdomen Limited RUQ (LIVER/GB)  Result Date: 10/14/2021 CLINICAL DATA:  Elevated LFTs EXAM: ULTRASOUND ABDOMEN LIMITED RIGHT UPPER QUADRANT COMPARISON:  CT 10/13/2021 FINDINGS: Gallbladder: Incompletely distended. No definite stones. Sonographer describes no sonographic Murphy sign. Common bile duct: Diameter: 2.4 mm, unremarkable Liver: No focal lesion identified. Within normal limits in parenchymal echogenicity. No intrahepatic biliary ductal dilatation  evident. Portal vein is patent on color Doppler imaging with normal direction of blood flow towards the liver. Other: Technologist describes technically difficult study secondary to bowel gas and body habitus. IMPRESSION: Negative Electronically Signed  By: Lucrezia Europe M.D.   On: 10/14/2021 14:35    Microbiology: Results for orders placed or performed during the hospital encounter of 10/14/21  Resp Panel by RT-PCR (Flu A&B, Covid) Nasopharyngeal Swab     Status: None   Collection Time: 10/14/21  4:17 PM   Specimen: Nasopharyngeal Swab; Nasopharyngeal(NP) swabs in vial transport medium  Result Value Ref Range Status   SARS Coronavirus 2 by RT PCR NEGATIVE NEGATIVE Final    Comment: (NOTE) SARS-CoV-2 target nucleic acids are NOT DETECTED.  The SARS-CoV-2 RNA is generally detectable in upper respiratory specimens during the acute phase of infection. The lowest concentration of SARS-CoV-2 viral copies this assay can detect is 138 copies/mL. A negative result does not preclude SARS-Cov-2 infection and should not be used as the sole basis for treatment or other patient management decisions. A negative result may occur with  improper specimen collection/handling, submission of specimen other than nasopharyngeal swab, presence of viral mutation(s) within the areas targeted by this assay, and inadequate number of viral copies(<138 copies/mL). A negative result must be combined with clinical observations, patient history, and epidemiological information. The expected result is Negative.  Fact Sheet for Patients:  EntrepreneurPulse.com.au  Fact Sheet for Healthcare Providers:  IncredibleEmployment.be  This test is no t yet approved or cleared by the [place] FDA and  has been authorized for detection and/or diagnosis of SARS-CoV-2 by FDA under an Emergency Use Authorization (EUA). This EUA will remain  in effect (meaning this test can be used) for the  duration of the COVID-19 declaration under Section 564(b)(1) of the Act, 21 U.S.C.section 360bbb-3(b)(1), unless the authorization is terminated  or revoked sooner.       Influenza A by PCR NEGATIVE NEGATIVE Final   Influenza B by PCR NEGATIVE NEGATIVE Final    Comment: (NOTE) The Xpert Xpress SARS-CoV-2/FLU/RSV plus assay is intended as an aid in the diagnosis of influenza from Nasopharyngeal swab specimens and should not be used as a sole basis for treatment. Nasal washings and aspirates are unacceptable for Xpert Xpress SARS-CoV-2/FLU/RSV testing.  Fact Sheet for Patients: EntrepreneurPulse.com.au  Fact Sheet for Healthcare Providers: IncredibleEmployment.be  This test is not yet approved or cleared by the [place] FDA and has been authorized for detection and/or diagnosis of SARS-CoV-2 by FDA under an Emergency Use Authorization (EUA). This EUA will remain in effect (meaning this test can be used) for the duration of the COVID-19 declaration under Section 564(b)(1) of the Act, 21 U.S.C. section 360bbb-3(b)(1), unless the authorization is terminated or revoked.  Performed at [facility], [place] [address], [place], [place] [address]     Labs: CBC: Recent Labs  Lab 10/16/21 1114 10/18/21 0417 10/19/21 0502 10/20/21 0424  WBC 19.6* 7.1 7.3 5.9  NEUTROABS 17.3*  --   --   --   HGB 9.9* 9.6* 9.2* 9.6*  HCT 29.8* 29.2* 28.2* 29.1*  MCV 99.3 99.7 98.6 98.6  PLT 345 347 354 709   Basic Metabolic Panel: Recent Labs  Lab 10/18/21 0417 10/19/21 0502 10/20/21 0424 10/21/21 0420 10/22/21 0416  NA 136 137 138 138 134*  K 3.4* 3.8 3.7 4.9 4.3  CL 105 106 109 105 101  CO2 25 26 28 27 28   GLUCOSE 99 111* 90 126* 104*  BUN 13 12 10 16 15   CREATININE 1.27* 1.24 1.10 1.06 0.98  CALCIUM 7.9* 8.0* 8.3* 8.7* 8.4*   Liver Function Tests: Recent Labs  Lab 10/18/21 0417 10/19/21 0502 10/20/21  0424  10/21/21 0420 10/22/21 0416  AST 117* 124* 110* 72* 53*  ALT 118* 113* 108* 93* 74*  ALKPHOS 306* 309* 280* 261* 246*  BILITOT 4.0* 3.6* 2.8* 2.6* 2.6*  PROT 5.6* 5.6* 5.7* 6.5 6.2*  ALBUMIN 2.2* 2.0* 2.1* 2.5* 2.5*   CBG: No results for input(s): GLUCAP in the last 168 hours.  Discharge time spent:  35 minutes.  Signed: Cordelia Poche, MD [facility] 10/22/2021

## 2021-10-22 NOTE — Plan of Care (Signed)

## 2021-10-22 NOTE — Telephone Encounter (Signed)
-----   Message from Unk Lightning, [place] sent at 10/22/2021 10:21 AM EST ----- ?Regarding: Needs lft ?This patient needs repeat hepatic panel in 1 week from today ordered under Dr. Marvell Fuller name. ? ?Thanks, JL L ? ?

## 2021-10-30 ENCOUNTER — Telehealth: Payer: Self-pay

## 2021-10-30 NOTE — Telephone Encounter (Signed)
Left Message for pt to call back  °

## 2021-10-30 NOTE — Telephone Encounter (Signed)
-----   Message from Missy Sabins, RN sent at 10/22/2021 10:30 AM EST ----- ?Regarding: Labs ?Pt needs LFT's, the orders are in epic. ? ?

## 2021-10-31 NOTE — Telephone Encounter (Signed)
Pt daughter Velna Hatchet made aware of  Dr. Leone Payor requesting to repeat labs: Requesting that pt to come by the lab in basement and have the lab drawn: Daughter stated that pt has an Office Visit with Dr. Leone Payor  this Monday that he is not sure that he is going to be able to make due to his breathing and being so short of breath: Pt daughter Velna Hatchet  was notified that if pt does agree to come to the office visit, I will personally meet him at his car with a wheel chair and take him to the appointment room and also to get labs: ?Velna Hatchet  verbalized understanding with all questions answered.   ?

## 2021-11-02 NOTE — Progress Notes (Unsigned)
11/02/2021 Michael [name] [id number] 12/25/1945   ASSESSMENT AND PLAN:  *** There are no diagnoses linked to this encounter.   History of Present Illness:  76 y.o. 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 pylori infection status post eradication biopsies, gastric intestinal metaplasia And others listed below, known to Dr. Rush Landmark returns to clinic today for evaluation of epigastric pain and jaundice. At the last visit with Dr. Rush Landmark 10/08/2021 patient did have elevated transaminases with elevated alk phos 426 and total bilirubin 7.1 Proceeded with MRCP abdomen which positive for cholelithiasis in addition to choledocholithiasis small stones in distal common bile duct. 10/20/2021 ERCP with Dr. Arelia Longest for choledocholithiasis with complete removal by biliary sphincterectomy and balloon extraction Patient did experience presumed ERCP pancreatitis with lipase in the 500s, repeat lipase 10/22/2021 showed downtrending to 92 Repeat labs 10/22/2021 show downtrending alkaline phosphatase 246, downtrending AST and ALT to 53 and 74, albumin 2.5, total bilirubin 2.6  Previous GI history: November 2022 EGD - No gross lesions in esophagus. Biopsied. - Non-obstructing Schatzki ring. Disrupted. - Z-line irregular, 38 cm from the incisors. - Dilation performed in the esophagus with Savary 18 mm. - 2 cm hiatal hernia. - Erythematous mucosa in the stomach. Biopsied. - No gross lesions in the duodenal bulb, in the first portion of the duodenum and in the second portion of the duodenum. Biopsied.  Pathology FINAL MICROSCOPIC DIAGNOSIS:  A. GASTRIC, BIOPSY:  - Mild chronic gastritis with intestinal metaplasia.  - Warthin-Starry negative for Helicobacter pylori.  B. DUODENUM, BIOPSY:  - Duodenal mucosa  with intact villous architecture.  - No villous atrophy or increased intraepithelial lymphocytes.  C. ESOPHAGUS, SCHATZKI RING, BIOPSY:  - Unremarkable squamous mucosa.  - No eosinophilic esophagitis.  D. ESOPHAGUS, BIOPSY:  - Unremarkable squamous mucosa.  - No eosinophilic esophagitis.    June 2022 Cologuard negative  CT ABDOMEN PELVIS W CONTRAST  Result Date: 10/14/2021 CLINICAL DATA:  Abdominal pain, jaundice and abnormal LFTs. EXAM: CT ABDOMEN AND PELVIS WITH CONTRAST TECHNIQUE: Multidetector CT imaging of the abdomen and pelvis was performed using the standard protocol following bolus administration of intravenous contrast. RADIATION DOSE REDUCTION: This exam was performed according to the departmental dose-optimization program which includes automated exposure control, adjustment of the mA and/or kV according to patient size and/or use of iterative reconstruction technique. CONTRAST:  82m OMNIPAQUE IOHEXOL 300 MG/ML  SOLN COMPARISON:  CTA chest, abdomen and pelvis 08/03/2020 FINDINGS: Lower chest: No acute abnormality. Emphysematous changes with subpleural fibrosis and honeycombing are again noted, with small chronic loculated posterior basal left pleural fluid collection and chronic lower lobe posterior atelectasis greater on the left. The heart is slightly enlarged. There is coronary artery calcification and old CABG. Hepatobiliary: The liver is normal in size with mild steatosis seen. No liver mass is evident. There are small capsular calcifications again noted posteriorly. There is no intrahepatic biliary prominence but is extrahepatic biliary dilatation with the common bile duct 9 mm. There is no visible ductal filling defect. Gallbladder partially contracted but no wall thickening, stones or adjacent edema noted. Pancreas: No ductal dilatation or mass is seen. The pancreas partially fatty atrophic. No adjacent edema. Spleen: No mass or splenomegaly. Adrenals/Urinary Tract: There is no  adrenal mass. Congenital malrotation again noted left kidney. Small 6 mm stable hypodensity in the posterior  right kidney is unchanged but too small to characterize. There are no stones or hydronephrosis. Both kidneys slightly small in length, both measuring 8.9 cm length. There is cortical thinning. There is no bladder thickening. Stomach/Bowel: Small hiatal hernia. There is no dilatation or wall thickening including the appendix. Left colonic diverticulosis is seen without evidence of colitis or diverticulitis most advanced diverticular disease in the sigmoid segment. Vascular/Lymphatic: There is heavy aortoiliac calcific plaque. 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 No enlarged lymph nodes are seen. Reproductive: There are calcifications of the prostate but no prostatomegaly. Other: Small umbilical hernia. Small left inguinal fat hernia. No free air, fluid or hemorrhage. Musculoskeletal: There are degenerative changes in the thoracic and lumbar spine. Advanced degenerative disc change with spondylosis and facet hypertrophy at L5-S1 is associated with severe foraminal stenosis. There is mild hip DJD. IMPRESSION: 1. Increasingly prominent common bile duct without a visible filling defect or pancreatic head mass. MRCP may be helpful for further evaluation. 2. Mild hepatic steatosis.  No mass enhancement. 3. Aortic atherosclerosis, with chronically occluded right common iliac artery. 4. Diverticulosis without evidence of acute diverticulitis. 5. Umbilical and left inguinal fat hernias. 6. Emphysematous and chronic change. 7. Additional findings as described above. Electronically Signed   By: Telford Nab M.D.   On: 10/14/2021 01:19   MR 3D Recon At Scanner  Result Date: 10/15/2021 CLINICAL DATA:  77 year old male with history of elevated liver function tests. Jaundice. EXAM: MRI ABDOMEN WITH  CONTRAST (WITH MRCP) TECHNIQUE: Multiplanar multisequence MR imaging of the abdomen was performed following the administration of intravenous contrast. Heavily T2-weighted images of the biliary and pancreatic ducts were obtained, and three-dimensional MRCP images were rendered by post processing. CONTRAST:  47mL GADAVIST GADOBUTROL 1 MMOL/ML IV SOLN COMPARISON:  No prior abdominal MRI. Abdominal ultrasound 10/14/2021. CT the abdomen and pelvis 10/13/2021. FINDINGS: Comment: Portions of today's examination are limited by considerable patient respiratory motion. Lower chest: Signal in the dependent portion of the left lower lobe, likely reflective of subsegmental atelectasis, adjacent to a trace left pleural effusion, but poorly evaluated on today's magnetic resonance examination. Signal void in the lower sternum, presumably from median sternotomy wires. Hepatobiliary: No suspicious cystic or solid hepatic lesions. MRCP images demonstrates some very mild intrahepatic biliary ductal dilatation. Common bile duct is dilated up to 11 mm in the porta hepatis. Gallbladder is nearly completely contracted. Small filling defects are evident in the gallbladder, indicative of tiny gallstones. No pericholecystic fluid. Gallbladder wall does not appear thickened. In the distal common bile duct there are some small filling defects concerning for choledocholithiasis. The largest of these measures up to 7 mm in diameter immediately before the level of the ampulla. Pancreas: There is some subtle focal enhancement at the level of the ampulla (axial image 64 of series 22), which may be related to indwelling stones. This is not mass-like in appearance, and measures only approximately 4 mm. No other potential pancreatic mass. No pancreatic ductal dilatation noted on MRCP images. No peripancreatic fluid collections or inflammatory changes. Spleen:  Unremarkable. Adrenals/Urinary Tract: Small T1 hypointense, T2 hyperintense, nonenhancing  lesions in both kidneys, compatible with tiny simple cysts. No hydroureteronephrosis in the visualized portions of the abdomen. Bilateral adrenal glands are normal in appearance. Stomach/Bowel: Unremarkable. Vascular/Lymphatic: Aortic atherosclerosis. No aneurysm identified in the visualized abdominal vasculature. No lymphadenopathy noted in the abdomen. Other: No significant volume of ascites noted  in the visualized portions of the peritoneal cavity. Musculoskeletal: No aggressive appearing osseous lesions are noted in the visualized portions of the skeleton. IMPRESSION: 1. Study is positive for cholelithiasis in addition to choledocholithiasis. There are several small stones in the distal common bile duct extending to the level of the ampulla. Additionally, at the level of the ampulla there is some very subtle focal enhancement. The possibility of an ampullary mass is not excluded, but not strongly favored (this enhancement may simply be inflammatory in the setting of indwelling stones impacted in the distal common bile duct). At this time, this is associated with only mild proximal intra and extrahepatic biliary ductal dilatation. Further evaluation with ERCP and endoscopic ultrasound should be considered if clinically appropriate. 2. Trace left pleural effusion with probable subsegmental atelectasis in the left lower lobe. 3. Aortic atherosclerosis. Electronically Signed   By: Vinnie Langton M.D.   On: 10/15/2021 05:20   DG ERCP  Result Date: 10/20/2021 CLINICAL DATA:  ERCP EXAM: ERCP TECHNIQUE: Multiple spot images obtained with the fluoroscopic device and submitted for interpretation post-procedure. FLUOROSCOPY: Refer to procedure report COMPARISON:  None. FINDINGS: A total of 17 fluoroscopic spot images taken during ERCP are submitted for review. Initial images demonstrate scope overlying the upper abdomen and wire catheterization of the CBD. The CBD appears somewhat dilated, although images are not  calibrated for independent measurements. Balloon sweeps of the CBD or performed. Final images demonstrate withdrawal of scope and all instrumentation. IMPRESSION: ERCP images as described. Refer to procedure report for full details. These images were submitted for radiologic interpretation only. Please see the procedural report for the amount of contrast and the fluoroscopy time utilized. Electronically Signed   By: Albin Felling M.D.   On: 10/20/2021 14:58   MR ABDOMEN MRCP W WO CONTAST  Result Date: 10/15/2021 CLINICAL DATA:  76 year old male with history of elevated liver function tests. Jaundice. EXAM: MRI ABDOMEN WITH CONTRAST (WITH MRCP) TECHNIQUE: Multiplanar multisequence MR imaging of the abdomen was performed following the administration of intravenous contrast. Heavily T2-weighted images of the biliary and pancreatic ducts were obtained, and three-dimensional MRCP images were rendered by post processing. CONTRAST:  71mL GADAVIST GADOBUTROL 1 MMOL/ML IV SOLN COMPARISON:  No prior abdominal MRI. Abdominal ultrasound 10/14/2021. CT the abdomen and pelvis 10/13/2021. FINDINGS: Comment: Portions of today's examination are limited by considerable patient respiratory motion. Lower chest: Signal in the dependent portion of the left lower lobe, likely reflective of subsegmental atelectasis, adjacent to a trace left pleural effusion, but poorly evaluated on today's magnetic resonance examination. Signal void in the lower sternum, presumably from median sternotomy wires. Hepatobiliary: No suspicious cystic or solid hepatic lesions. MRCP images demonstrates some very mild intrahepatic biliary ductal dilatation. Common bile duct is dilated up to 11 mm in the porta hepatis. Gallbladder is nearly completely contracted. Small filling defects are evident in the gallbladder, indicative of tiny gallstones. No pericholecystic fluid. Gallbladder wall does not appear thickened. In the distal common bile duct there are some  small filling defects concerning for choledocholithiasis. The largest of these measures up to 7 mm in diameter immediately before the level of the ampulla. Pancreas: There is some subtle focal enhancement at the level of the ampulla (axial image 64 of series 22), which may be related to indwelling stones. This is not mass-like in appearance, and measures only approximately 4 mm. No other potential pancreatic mass. No pancreatic ductal dilatation noted on MRCP images. No peripancreatic fluid collections or inflammatory changes. Spleen:  Unremarkable. Adrenals/Urinary Tract: Small T1 hypointense, T2 hyperintense, nonenhancing lesions in both kidneys, compatible with tiny simple cysts. No hydroureteronephrosis in the visualized portions of the abdomen. Bilateral adrenal glands are normal in appearance. Stomach/Bowel: Unremarkable. Vascular/Lymphatic: Aortic atherosclerosis. No aneurysm identified in the visualized abdominal vasculature. No lymphadenopathy noted in the abdomen. Other: No significant volume of ascites noted in the visualized portions of the peritoneal cavity. Musculoskeletal: No aggressive appearing osseous lesions are noted in the visualized portions of the skeleton. IMPRESSION: 1. Study is positive for cholelithiasis in addition to choledocholithiasis. There are several small stones in the distal common bile duct extending to the level of the ampulla. Additionally, at the level of the ampulla there is some very subtle focal enhancement. The possibility of an ampullary mass is not excluded, but not strongly favored (this enhancement may simply be inflammatory in the setting of indwelling stones impacted in the distal common bile duct). At this time, this is associated with only mild proximal intra and extrahepatic biliary ductal dilatation. Further evaluation with ERCP and endoscopic ultrasound should be considered if clinically appropriate. 2. Trace left pleural effusion with probable subsegmental  atelectasis in the left lower lobe. 3. Aortic atherosclerosis. Electronically Signed   By: Vinnie Langton M.D.   On: 10/15/2021 05:20   US Abdomen Limited RUQ (LIVER/GB)  Result Date: 10/14/2021 CLINICAL DATA:  Elevated LFTs EXAM: ULTRASOUND ABDOMEN LIMITED RIGHT UPPER QUADRANT COMPARISON:  CT 10/13/2021 FINDINGS: Gallbladder: Incompletely distended. No definite stones. Sonographer describes no sonographic Murphy sign. Common bile duct: Diameter: 2.4 mm, unremarkable Liver: No focal lesion identified. Within normal limits in parenchymal echogenicity. No intrahepatic biliary ductal dilatation evident. Portal vein is patent on color Doppler imaging with normal direction of blood flow towards the liver. Other: Technologist describes technically difficult study secondary to bowel gas and body habitus. IMPRESSION: Negative Electronically Signed   By: Lucrezia Europe M.D.   On: 10/14/2021 14:35    Current Medications:   Current Outpatient Medications (Endocrine & Metabolic):    levothyroxine (SYNTHROID) 75 MCG tablet, TAKE 1 TABLET BY MOUTH DAILY  Current Outpatient Medications (Cardiovascular):    atorvastatin (LIPITOR) 20 MG tablet, Take 1 tablet (20 mg total) by mouth at bedtime.   fenofibrate (TRICOR) 48 MG tablet, Take 1 tablet (48 mg total) by mouth daily.   metoprolol tartrate (LOPRESSOR) 25 MG tablet, Take 1 tablet (25 mg total) by mouth 2 (two) times daily.   nitroGLYCERIN (NITROSTAT) 0.4 MG SL tablet, Place 1 tablet (0.4 mg total) under the tongue every 5 (five) minutes as needed. After 3 doses, if no relief dial 911  Current Outpatient Medications (Respiratory):    Chlorpheniramine Maleate (ALLERGY PO), Take 2 tablets by mouth daily.   Current Outpatient Medications (Hematological):    clopidogrel (PLAVIX) 75 MG tablet, Take 1 tablet (75 mg total) by mouth daily.  Current Outpatient Medications (Other):    esomeprazole (NEXIUM) 40 MG capsule, Take 1 capsule (40 mg total) by mouth daily.    Multiple Vitamin (MULTIVITAMIN WITH MINERALS) TABS tablet, Take 1 tablet by mouth daily.   OXYGEN, Inhale 5 L into the lungs continuous.   polyethylene glycol (MIRALAX / GLYCOLAX) 17 g packet, Take 17 g by mouth daily.   Vitamin D, Ergocalciferol, (DRISDOL) 1.25 MG (50000 UNIT) CAPS capsule, TAKE 1 CAPSULE BY MOUTH EVERY 7 DAYS  Surgical History:  He  has a past surgical history that includes Iliac artery stent (10/27/2011); transthoracic echocardiogram (08/19/2011); NM MYOVIEW LTD (09/2016); percutaneous stent intervention (Left, 10/27/2011);  LEFT HEART CATH AND CORS/GRAFTS ANGIOGRAPHY (N/A, 11/24/2016); CORONARY STENT INTERVENTION (N/A, 11/24/2016); Tonsillectomy (~ 1963); Coronary angioplasty with stent (11/24/2016); Coronary artery bypass graft (06/10/2011); LEFT HEART CATH AND CORONARY ANGIOGRAPHY (06/09/2011); Esophagogastroduodenoscopy (egd) with propofol (N/A, 07/14/2021); biopsy (07/14/2021); Savory dilation (N/A, 07/14/2021); ERCP (N/A, 10/20/2021); sphincterotomy (10/20/2021); and removal of stones (10/20/2021). Family History:  His family history includes CAD in his sister; Diabetes in his mother; Heart disease in his mother; Heart failure in his mother; Pneumonia in his father; Stroke in his sister. Social History:   reports that he quit smoking about 10 years ago. His smoking use included cigarettes. He has a 103.00 pack-year smoking history. He quit smokeless tobacco use about 18 years ago.  His smokeless tobacco use included chew. He reports that he does not drink alcohol and does not use drugs.  Current Medications, Allergies, Past Medical History, Past Surgical History, Family History and Social History were reviewed in [employer] record.  Physical Exam: There were no vitals taken for this visit. General:   Pleasant, well developed male in no acute distress Eyes: {sclerae:26738},conjunctive {conjuctiva:26739}  Heart:  {HEART EXAM HEM/ONC:21750} Pulm: Clear  anteriorly; no wheezing Abdomen:  {BlankSingle:19197::"Distended","Ridged","Soft"}, {BlankSingle:19197::"Flat","Obese"} AB, skin exam {ABDOMEN SKIN EXAM:22649}, {BlankSingle:19197::"Absent","Hyperactive, tinkling","Hypoactive","Sluggish","Normal"} bowel sounds. {Desc; pc desc - abdomen tenderness:5168} tenderness {anatomy; site abdomen:5010}. {BlankMultiple:19196::"Without guarding","With guarding","Without rebound","With rebound"}, {Exam; abdomen organomegaly:15152}. Extremities:  {With/Without:304960234} edema. Peripheral pulses intact.  Neurologic:  Alert and  oriented x4;  grossly normal neurologically. Skin:   Dry and intact without significant lesions or rashes. Psychiatric: Demonstrates good judgement and reason without abnormal affect or behaviors.  Vladimir Crofts, PA-C 11/02/21

## 2021-11-03 ENCOUNTER — Ambulatory Visit: Payer: Medicare HMO | Admitting: Physician Assistant

## 2021-12-08 ENCOUNTER — Encounter (HOSPITAL_COMMUNITY): Payer: Self-pay

## 2021-12-08 ENCOUNTER — Emergency Department (HOSPITAL_COMMUNITY)
Admission: EM | Admit: 2021-12-08 | Discharge: 2021-12-08 | Discharge status: Against Medical Advice | Payer: Medicare HMO | Attending: Emergency Medicine | Admitting: Emergency Medicine

## 2021-12-08 ENCOUNTER — Emergency Department (HOSPITAL_COMMUNITY): Payer: Medicare HMO

## 2021-12-08 ENCOUNTER — Telehealth: Payer: Self-pay | Admitting: Gastroenterology

## 2021-12-08 DIAGNOSIS — N189 Chronic kidney disease, unspecified: Secondary | ICD-10-CM | POA: Insufficient documentation

## 2021-12-08 DIAGNOSIS — Z5321 Procedure and treatment not carried out due to patient leaving prior to being seen by health care provider: Secondary | ICD-10-CM | POA: Diagnosis not present

## 2021-12-08 DIAGNOSIS — I2510 Atherosclerotic heart disease of native coronary artery without angina pectoris: Secondary | ICD-10-CM | POA: Diagnosis not present

## 2021-12-08 DIAGNOSIS — R197 Diarrhea, unspecified: Secondary | ICD-10-CM | POA: Insufficient documentation

## 2021-12-08 DIAGNOSIS — E860 Dehydration: Secondary | ICD-10-CM | POA: Diagnosis not present

## 2021-12-08 DIAGNOSIS — R531 Weakness: Secondary | ICD-10-CM | POA: Insufficient documentation

## 2021-12-08 DIAGNOSIS — R112 Nausea with vomiting, unspecified: Secondary | ICD-10-CM | POA: Diagnosis not present

## 2021-12-08 DIAGNOSIS — R109 Unspecified abdominal pain: Secondary | ICD-10-CM | POA: Diagnosis not present

## 2021-12-08 DIAGNOSIS — J449 Chronic obstructive pulmonary disease, unspecified: Secondary | ICD-10-CM | POA: Insufficient documentation

## 2021-12-08 LAB — CBC WITH DIFFERENTIAL/PLATELET
Abs Immature Granulocytes: 0.09 10*3/uL — ABNORMAL HIGH (ref 0.00–0.07)
Basophils Absolute: 0.1 10*3/uL (ref 0.0–0.1)
Basophils Relative: 1 %
Eosinophils Absolute: 0.1 10*3/uL (ref 0.0–0.5)
Eosinophils Relative: 1 %
HCT: 37.4 % — ABNORMAL LOW (ref 39.0–52.0)
Hemoglobin: 12.1 g/dL — ABNORMAL LOW (ref 13.0–17.0)
Immature Granulocytes: 1 %
Lymphocytes Relative: 8 %
Lymphs Abs: 1.1 10*3/uL (ref 0.7–4.0)
MCH: 32.1 pg (ref 26.0–34.0)
MCHC: 32.4 g/dL (ref 30.0–36.0)
MCV: 99.2 fL (ref 80.0–100.0)
Monocytes Absolute: 0.8 10*3/uL (ref 0.1–1.0)
Monocytes Relative: 6 %
Neutro Abs: 11.2 10*3/uL — ABNORMAL HIGH (ref 1.7–7.7)
Neutrophils Relative %: 83 %
Platelets: 654 10*3/uL — ABNORMAL HIGH (ref 150–400)
RBC: 3.77 MIL/uL — ABNORMAL LOW (ref 4.22–5.81)
RDW: 15 % (ref 11.5–15.5)
WBC: 13.4 10*3/uL — ABNORMAL HIGH (ref 4.0–10.5)
nRBC: 0 % (ref 0.0–0.2)

## 2021-12-08 LAB — COMPREHENSIVE METABOLIC PANEL
ALT: 23 U/L (ref 0–44)
AST: 29 U/L (ref 15–41)
Albumin: 2.5 g/dL — ABNORMAL LOW (ref 3.5–5.0)
Alkaline Phosphatase: 147 U/L — ABNORMAL HIGH (ref 38–126)
Anion gap: 10 (ref 5–15)
BUN: 16 mg/dL (ref 8–23)
CO2: 22 mmol/L (ref 22–32)
Calcium: 8.9 mg/dL (ref 8.9–10.3)
Chloride: 105 mmol/L (ref 98–111)
Creatinine, Ser: 0.98 mg/dL (ref 0.61–1.24)
GFR, Estimated: >60 mL/min (ref >60)
Glucose, Bld: 108 mg/dL — ABNORMAL HIGH (ref 70–99)
Potassium: 3.9 mmol/L (ref 3.5–5.1)
Sodium: 137 mmol/L (ref 135–145)
Total Bilirubin: 1.1 mg/dL (ref 0.3–1.2)
Total Protein: 7.8 g/dL (ref 6.5–8.1)

## 2021-12-08 NOTE — Telephone Encounter (Signed)
The pt daughter called to report that the pt is "not doing good" he has a feeling that he is dying.  He has abd pain, not eating or drinking. He is very weak so much so that he is not able to get up or even keep his follow up appts.  I have advised that he should go to the ED for eval.  She was told to call 911 since he is unable to get up to get to the car.  The pt has been advised of the information and verbalized understanding.    ? ?FYI: Dr Meridee Score. ?

## 2021-12-08 NOTE — Telephone Encounter (Signed)
Patient daughter called seeking advise. Per daughter, his symptoms are worsening. Patient has been weaker since procedure, not eating as much, and is unable to get up. Patient daughter is asking if we suggest going to the ED? Please advise.  ?

## 2021-12-08 NOTE — Telephone Encounter (Signed)
I am sorry to hear this. ?I agree with further assessment in the ED. ?He never came back for LFT follow up. ?If his numbers are worse, or there are other issues found by ED, then I am sure they will call our Inpatient team. ? ?Corliss Parish, MD ?[facility] ?Advanced Endoscopy ?Office # [phone] ?

## 2021-12-08 NOTE — ED Provider Triage Note (Signed)
Emergency Medicine Provider Triage Evaluation Note ? ?Michael [name] , a 76 y.o. male  was evaluated in triage.  Pt complains of increasing weakness and dehydration.  Having conversations to have his gallbladder taken out, but is told he may not survive the surgery.  Worsening of symptoms making it hard to eat or stay hydrated.  Wants help with symptoms, including pain.  Previous imaging identified stones within the gallbladder.  Hx of COPD, CKD, CAD, chronic respiratory failure, PAD, prior NSTEMI.  On 4 L O2 at home ? ?Review of Systems  ?Positive: As above ?Negative: As above ? ?Physical Exam  ?BP 116/77 (BP Location: Left Arm)   Pulse (!) 103   Temp 98.7 ?F (37.1 ?C) (Axillary)   Resp 18   SpO2 94%  ?Gen:   Awake, no distress   ?Resp:  Normal effort CTAB ?MSK:   Moves extremities with some difficulty ?Other:  Appears clinically dehydrated; mild jaundice.  RUQ tenderness. ? ?Medical Decision Making  ?Medically screening exam initiated at 1:43 PM.  Appropriate orders placed.  Nehemiah Mcfarren was informed that the remainder of the evaluation will be completed by another provider, this initial triage assessment does not replace that evaluation, and the importance of remaining in the ED until their evaluation is complete. ? ?Labs, imaging, EKG ordered ?  ?Cecil Cobbs, PA-C ?12/08/21 1357 ? ?

## 2021-12-08 NOTE — ED Triage Notes (Signed)
Pt arrived via POV, c/o abd pain. States he is supposed to have gallbladder out but is unable to due to  breathing issues. Endorses some vomiting/nausea, states rare episodes of diarrhea. Denies any fever/chills.  ?

## 2021-12-15 ENCOUNTER — Encounter: Payer: Self-pay | Admitting: Physician Assistant

## 2021-12-15 ENCOUNTER — Ambulatory Visit (INDEPENDENT_AMBULATORY_CARE_PROVIDER_SITE_OTHER): Payer: Medicare HMO | Admitting: Physician Assistant

## 2021-12-15 VITALS — BP 82/54 | HR 94 | Temp 97.7°F | Ht 67.0 in | Wt 180.0 lb

## 2021-12-15 DIAGNOSIS — K828 Other specified diseases of gallbladder: Secondary | ICD-10-CM | POA: Diagnosis not present

## 2021-12-15 DIAGNOSIS — R7989 Other specified abnormal findings of blood chemistry: Secondary | ICD-10-CM

## 2021-12-15 DIAGNOSIS — K31A0 Gastric intestinal metaplasia, unspecified: Secondary | ICD-10-CM

## 2021-12-15 DIAGNOSIS — R1013 Epigastric pain: Secondary | ICD-10-CM | POA: Diagnosis not present

## 2021-12-15 DIAGNOSIS — G8929 Other chronic pain: Secondary | ICD-10-CM

## 2021-12-15 DIAGNOSIS — R600 Localized edema: Secondary | ICD-10-CM

## 2021-12-15 MED ORDER — TRAMADOL HCL 50 MG PO TABS: 50.0000 mg | ORAL_TABLET | Freq: Three times a day (TID) | ORAL | 0 refills | Status: AC | PRN

## 2021-12-15 MED ORDER — FUROSEMIDE 20 MG PO TABS: 20.0000 mg | ORAL_TABLET | Freq: Every day | ORAL | 0 refills | Status: AC

## 2021-12-15 NOTE — Progress Notes (Signed)
? ?Established Patient Office Visit ? ?Subjective   ?Patient ID: Michael [name], male    DOB: [birth year]  Age: 76 y.o. MRN: [id number] ? ?Chief Complaint  ?Patient presents with  ? Follow-up  ? ? ?HPI ?Patient presents to discuss referral to general surgery for consult of gallbladder removal. Patient is accompanied by his daughter. Patient reports having severe abdominal pain which is constant. States unable to eat, barely can eat soup.  Currently taking Tylenol 1000 mg every 8 hrs. Using wheelchair for ambulation due to weakness. Reports since he had his procedure (endoscopy) he's been feeling worse. Went to the ED 12/08/2021 but after waiting for 10 hours left without being seen. States has his recliner broke and since then has noticed his lower feet swelling. No worsening shortness of breath from baseline or chest pain. ? ? ?Patient Active Problem List  ? Diagnosis Date Noted  ? Post-ERCP acute pancreatitis   ? Choledocholithiasis 10/16/2021  ? Cholelithiasis with choledocholithiasis 10/15/2021  ? Anemia due to chronic kidney disease 10/15/2021  ? Malnutrition of moderate degree 10/15/2021  ? Pain in the abdomen 10/14/2021  ? Acute renal failure superimposed on stage 3a chronic kidney disease ([place]) 10/14/2021  ? Gastric intestinal metaplasia 10/08/2021  ? Chronic gastritis without bleeding 10/08/2021  ? History of Helicobacter infection 10/08/2021  ? Jaundice 10/08/2021  ? Dark urine 10/08/2021  ? Other dysphagia 10/08/2021  ? Gallbladder sludge 10/08/2021  ? Abdominal pain, chronic, epigastric 06/07/2021  ? Hematemesis with nausea 06/07/2021  ? Esophageal dysphagia 06/07/2021  ? Unintentional weight loss 06/07/2021  ? Abnormal LFTs 06/07/2021  ? Chronic constipation 06/07/2021  ? Requires continuous at home supplemental oxygen 06/07/2021  ? Long term (current) use of antithrombotics/antiplatelets 06/07/2021  ? Abdominal pain 08/03/2020  ? COPD  GOLD II with reversibility  03/16/2019  ? Pre-diabetes 03/09/2019  ?  Prediabetes 05/24/2018  ? Vitamin D deficiency 05/24/2018  ? Chronic kidney disease, stage 3, mod decreased GFR (HCC) 05/24/2018  ? Lung nodule < 6cm on CT 10/2017-   Post L [place] 11/05/2017  ? Hyperkalemia 07/09/2017  ? History of tobacco abuse- 2+ppd for 50+ yrs- quit in Jun 08 2011 05/24/2017  ? Colonoscopy refused 05/24/2017  ? Noncompliance with immunization regimen 05/24/2017  ? Personal history of noncompliance with medical treatment 05/24/2017  ? Presence of drug coated stent in right coronary artery 11/24/2016  ? HOH (hard of hearing) 11/25/2015  ? Essential hypertension 11/24/2015  ?  Class: Diagnosis of  ? Hypothyroidism 11/24/2015  ?  Class: Diagnosis of  ? Chronic respiratory failure with hypoxia (HCC) - SaO2 drops to 87% with ambulation 06/18/2015  ? Encounter for long-term (current) use of other medications 05/21/2013  ? Hyperlipidemia with target LDL less than 70 05/21/2013  ? Atherosclerosis 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 12/10/2011  ? Peripheral arterial occlusive disease ([place]) 10/22/2011  ?  Class: Diagnosis of  ? History of non-ST elevation myocardial infarction (NSTEMI) ([place]) 06/22/2011  ? Chronic obstructive pulmonary disease ([place]) 06/10/2011  ?  Class: Diagnosis of  ? History of coronary artery bypass surgery 06/10/2011  ?  Class: History of  ? CAD, multiple vessel - status post CABG: LIMA-LAD, SVG-RPDA; PCI native RCA for 100% SVG-RCA 06/08/2011  ?  Class: History of  ? Multiple vessel coronary artery disease 06/08/2011  ? ?Past Medical History:  ?Diagnosis Date  ? Arthritis   ? "right hip; right wrist" (11/24/2016)  ? CAD, multiple vessel 05/2011  ? 90%  mid LAD with diffuse proximal stenosis, 100% proximal RCA --> s/p CABG x 2 (LIMA-LAD, SVG-dRCA); b) Lexiscan Myoview 09/2016 w/ Inferior-Inferolateral defect -> Cath:  11/24/16: 100% CTO of SVG-RCA (but nagtive /RCA only subtotal occlusion) --> PCI with DES x2 (resolute onyx DES 2.25 mm x 30 mm, 2.5 mm x  20 mm) RCA  ? Carotid artery occlusion   ? COPD (chronic obstructive pulmonary disease) with emphysema ([place])   ? Essential hypertension   ? History of gout   ? History of tobacco abuse   ? quit 05/2011  ? Hyperlipidemia with target LDL less than 70   ? Hypothyroidism   ? MI (myocardial infarction) ([place]) 05/2011  ? "I had 3 in 2 days"  ? NSTEMI (non-ST elevated myocardial infarction) ([place]) 06/05/2011  ? With CHF - probably late presentation for missed STEMI --> CATH- MV CAD;; Echo 07/2001: EF > 55%, mild MAC, mild TR, no WMA  with Post-op Setpal dyskinesis, [place] not seen)  ? PAD (peripheral artery disease) ([place]) 06/08/2011  ? a) R Common Iliac 100%, L Common Iliac 80+% occlusion; b) L CIA - Aterectomy- 10 mm x 40 mm self-extending Stent (postdilated to 7 mm with 20-30% residual)--> Deferred R AoFem bypass;; c) LEA Dopplers 04/2012 & 04/2014 - RABI 0.8 & LABI 1.1 - STABLE, no change  ? Pneumonia 1940s X 1  ? Presence of drug coated stent in right coronary artery 11/24/2016  ? p-mRCA: Resolute Onyx DES 2.25 mm x 38 mm --> 2.5 mm x 18 mm (post-dilated from 2.7-2.8 mm)  ? ?Past Surgical History:  ?Procedure Laterality Date  ? BIOPSY  07/14/2021  ? Procedure: BIOPSY;  Surgeon: Irving Copas., MD;  Location: [facility];  Service: Gastroenterology;;  ? CORONARY ANGIOPLASTY WITH STENT PLACEMENT  11/24/2016  ? with 100% SVG-RCA & flow in native RCA now present --> PCI-p-m RCA 2 Overlapping Resolute DES (2.25 x 38, 1.5 x 18)  ? CORONARY ARTERY BYPASS GRAFT  06/10/2011  ? oLIMA to LAD, SVG to PDA (Dr. Servando Snare)  ? CORONARY STENT INTERVENTION N/A 11/24/2016  ? Procedure: Coronary Stent Intervention;  Surgeon: Leonie Man, MD;  Location[facility] INVASIVE CV LAB: [place] 2.25X38 & overlapping DES RESOLUTE ONYX 2.5X18   ? ERCP N/A 10/20/2021  ? Procedure: ENDOSCOPIC RETROGRADE CHOLANGIOPANCREATOGRAPHY (ERCP);  Surgeon: Gatha Mayer, MD;  Location: [facility];  Service: Gastroenterology;  Laterality: N/A;  ?  ESOPHAGOGASTRODUODENOSCOPY (EGD) WITH PROPOFOL N/A 07/14/2021  ? Procedure: ESOPHAGOGASTRODUODENOSCOPY (EGD) WITH PROPOFOL;  Surgeon: Rush Landmark Telford Nab., MD;  Location: [facility];  Service: Gastroenterology;  Laterality: N/A;  ? ILIAC ARTERY STENT  10/27/2011  ? (Dr. Ellyn Hack Dr. Gwenlyn Found) left CIA - 35mx40mm self-expanding stent, decreasing downt to 20-30% lesion, 100% occluded R CIA with extensive lumbar collateralization to R internal iliac;  05/2016: Ao-Ileac & LEA Dopplers: RABI 0.8, LABI 1.2. B TBI 0.77 -- he has known B disease s/p L CIA stent. RLABI decreased and LABI is normal; aortoiliac atherosclerosis w/ CTO of R CIA but normal REIA.  ? LEFT HEART CATH AND CORONARY ANGIOGRAPHY  06/09/2011  ? 90% mid LAD with diffuse proximal stenosis, 100% proximal RCA --> s/p CABG x 2 (LIMA-LAD, SVG-dRCA);  ? LEFT HEART CATH AND CORS/GRAFTS ANGIOGRAPHY N/A 11/24/2016  ? Procedure: LEFT HEART CATH AND CORS/GRAFTS ANGIOGRAPHY;  Surgeon: DLeonie Man MD;  Location: [facility] INVASIVE CV LAB: 100% SVG-RCA, but now native RCA only sub-CTO with antegrade flow.  85% mLAD after D1. 40% oOM1 --> PCI  of Native RCA  ? NM MYOVIEW LTD  09/2016  ?  EF 62%. Medium sized, moderate severity defect in the basal inferior, mid inferoseptal and mid inferior walls is partially reversible. - INTERMEDIATE RISK -- CATH  ? PERCUTANEOUS STENT INTERVENTION Left 10/27/2011  ? Procedure: PERCUTANEOUS STENT INTERVENTION;  Surgeon: Lorretta Harp, MD / Leonie Man, MD;  Location: [facility] CATH LAB;  Service: Cardiovascular;  Laterality: Left;  ? REMOVAL OF STONES  10/20/2021  ? Procedure: REMOVAL OF STONES;  Surgeon: Gatha Mayer, MD;  Location: [facility];  Service: Gastroenterology;;  ? Azzie Almas DILATION N/A 07/14/2021  ? Procedure: SAVORY DILATION;  Surgeon: Irving Copas., MD;  Location: [facility];  Service: Gastroenterology;  Laterality: N/A;  ? SPHINCTEROTOMY  10/20/2021  ? Procedure: SPHINCTEROTOMY;  Surgeon: Gatha Mayer, MD;   Location: [facility];  Service: Gastroenterology;;  ? TONSILLECTOMY  ~ 1963  ? TRANSTHORACIC ECHOCARDIOGRAM  08/19/2011  ? EF=>55%; mild mitral annular calcif; mild TR  ? ?No Known Allergies ? ? ?ROS ?Review

## 2021-12-16 ENCOUNTER — Telehealth: Payer: Self-pay | Admitting: Gastroenterology

## 2021-12-16 ENCOUNTER — Other Ambulatory Visit: Payer: Self-pay

## 2021-12-16 LAB — COMPREHENSIVE METABOLIC PANEL
ALT: 155 IU/L — ABNORMAL HIGH (ref 0–44)
AST: 492 IU/L — ABNORMAL HIGH (ref 0–40)
Albumin/Globulin Ratio: 0.7 — ABNORMAL LOW (ref 1.2–2.2)
Albumin: 2.7 g/dL — ABNORMAL LOW (ref 3.7–4.7)
Alkaline Phosphatase: 895 IU/L — ABNORMAL HIGH (ref 44–121)
BUN/Creatinine Ratio: 9 — ABNORMAL LOW (ref 10–24)
BUN: 14 mg/dL (ref 8–27)
Bilirubin Total: 1.4 mg/dL — ABNORMAL HIGH (ref 0.0–1.2)
CO2: 22 mmol/L (ref 20–29)
Calcium: 8.6 mg/dL (ref 8.6–10.2)
Chloride: 100 mmol/L (ref 96–106)
Creatinine, Ser: 1.55 mg/dL — ABNORMAL HIGH (ref 0.76–1.27)
Globulin, Total: 4 g/dL (ref 1.5–4.5)
Glucose: 97 mg/dL (ref 70–99)
Potassium: 4.8 mmol/L (ref 3.5–5.2)
Sodium: 137 mmol/L (ref 134–144)
Total Protein: 6.7 g/dL (ref 6.0–8.5)
eGFR: 46 mL/min/{1.73_m2} — ABNORMAL LOW (ref >59)

## 2021-12-16 LAB — CBC WITH DIFFERENTIAL/PLATELET
Basophils Absolute: 0.1 10*3/uL (ref 0.0–0.2)
Basos: 0 %
EOS (ABSOLUTE): 1.3 10*3/uL — ABNORMAL HIGH (ref 0.0–0.4)
Eos: 5 %
Hematocrit: 31.5 % — ABNORMAL LOW (ref 37.5–51.0)
Hemoglobin: 10.6 g/dL — ABNORMAL LOW (ref 13.0–17.7)
Immature Grans (Abs): 0.2 10*3/uL — ABNORMAL HIGH (ref 0.0–0.1)
Immature Granulocytes: 1 %
Lymphocytes Absolute: 0.7 10*3/uL (ref 0.7–3.1)
Lymphs: 2 %
MCH: 32 pg (ref 26.6–33.0)
MCHC: 33.7 g/dL (ref 31.5–35.7)
MCV: 95 fL (ref 79–97)
Monocytes Absolute: 0.8 10*3/uL (ref 0.1–0.9)
Monocytes: 3 %
Neutrophils Absolute: 26 10*3/uL — ABNORMAL HIGH (ref 1.4–7.0)
Neutrophils: 89 %
Platelets: 524 10*3/uL — ABNORMAL HIGH (ref 150–450)
RBC: 3.31 x10E6/uL — ABNORMAL LOW (ref 4.14–5.80)
RDW: 14.1 % (ref 11.6–15.4)
WBC: 29.1 10*3/uL (ref 3.4–10.8)

## 2021-12-16 MED ORDER — CIPROFLOXACIN HCL 500 MG PO TABS: 500.0000 mg | ORAL_TABLET | Freq: Two times a day (BID) | ORAL | 0 refills | Status: AC

## 2021-12-16 NOTE — Telephone Encounter (Signed)
I am sorry to hear this. ?Although I still feel that he would gain benefit from coming to the hospital, we cannot force him, and if his daughter who is POA, also chooses to not pursue further workup that is within their rights.  ?I wish him and his family the best. ?His PCP will be arranging Palliative Medicine as per prior clinic visit notation. ?Patty, please send in a prescription for Ciprofloxacin 500 mg twice daily for 10-day course. ?This is for concern of underlying biliary disease/recurrent choledocholithiasis/gallbladder disease. ?If things change and he comes to the hospital, then further things can be sorted out from there. ?Thank you. ?GM ?

## 2021-12-16 NOTE — Telephone Encounter (Signed)
Spoke with patient's daughter & she is aware of prescription that has been sent to pharmacy. ?

## 2021-12-16 NOTE — Telephone Encounter (Addendum)
Received a message from patient's PCP. ?Patient went in for a clinic visit with progressive worsening failure to thrive, inability to tolerate eating, persisting abdominal pain. ?LFTs were drawn and his alkaline phosphatase has risen to the 800 range his other transferase is have increased as well his bilirubin mildly elevated. ?White count is pending. ?I called the patient's daughter, whom I have spoken with previously and is his main POA and point of contact this morning. ?I received a similar history that since his hospitalization the patient has just continued to decline.  He continues to have abdominal pain. ?Patient has some good days and some bad days. ?Compared to his short stay in the emergency department before he left his white count had been elevated but his LFTs were down so his rise in LFTs within the last week is concerning for his gallbladder having a problem or potentially having dropped another stone into the bile duct with a significant rise in alkaline phosphatase. ?I think at this point the patient needs to come back to the emergency department and be hospitalized most likely.  It would be reasonable to consider repeat imaging including a HIDA to see if his gallbladder is infected or blocked. ?If the gallbladder is infected or blocked then I think consideration of a percutaneous cholecystostomy tube is reasonable because I think his risk factors and comorbidities placing him at very high risk of complications from a cholecystectomy but that is a question for our surgeons to then decide. ?With him having gotten pancreatitis after his ERCP, the possibility of post ERCP pancreatitis and the peripancreatic fluid collection or fluid collection having developed around the pancreas should also be considered so cross-sectional imaging with a CT abdomen/pelvis would also be reasonable. ?With the patient being on Plavix, since he is already had an ERCP it will not be a complicating factor other than if he  has large stones that have dropped he may need stent placement rather than just balloon sweeping. ?This is a difficult situation but 1 that cannot be managed as an outpatient unfortunately. ?The patient's daughter agrees to this plan of action and she will touch base with the patient today and likely bring the patient to the emergency department or have EMS transfer him. ?Ultimately, if the patient defers on coming to the hospital then consideration of palliative medicine discussion/hospice care is reasonable as was outlined by his PCP yesterday.  He may benefit from antibiotics until palliative medicine is present. ?Unfortunately I cannot do much more as an outpatient because of our inability to add further outpatient ERCPs due to time schedule at this point. ? ?I will forward this information to the inpatient GI team as well as the patient's PCP. ? ? ?Corliss Parish, MD ?[facility] ?Advanced Endoscopy ?Office # [phone] ?

## 2021-12-16 NOTE — Telephone Encounter (Addendum)
Patients daughter called to let you know the patient has opted out of going to the hospital and has requested to have the antibiotics instead. ?

## 2021-12-30 ENCOUNTER — Ambulatory Visit: Payer: Medicare HMO | Admitting: Gastroenterology

## 2022-01-01 ENCOUNTER — Ambulatory Visit: Payer: Medicare HMO | Admitting: Gastroenterology

## 2022-01-06 ENCOUNTER — Ambulatory Visit: Payer: Medicare HMO | Admitting: Physician Assistant

## 2022-01-20 ENCOUNTER — Telehealth: Payer: Self-pay | Admitting: Physician Assistant

## 2022-01-20 NOTE — Telephone Encounter (Signed)
Hospice nurse calle and said pt has passed away 2023-02-03 3:15am

## 2022-01-20 NOTE — Telephone Encounter (Signed)
Marked patient as deceased. AS, CMA

## 2022-01-22 DEATH — deceased

## 2022-01-27 ENCOUNTER — Ambulatory Visit: Payer: Medicare HMO | Admitting: Physician Assistant
